# Patient Record
Sex: Female | Born: 1960 | Race: White | Hispanic: No | Marital: Married | Smoking: Never smoker
Health system: Southern US, Community
[De-identification: ages and names within clinical notes are randomized; demographics above are authoritative.]

## PROBLEM LIST (undated history)

## (undated) ENCOUNTER — Emergency Department (HOSPITAL_COMMUNITY): Admission: EM | Payer: BC Managed Care – PPO | Source: Home / Self Care

## (undated) DIAGNOSIS — M2550 Pain in unspecified joint: Secondary | ICD-10-CM

## (undated) DIAGNOSIS — I774 Celiac artery compression syndrome: Secondary | ICD-10-CM

## (undated) DIAGNOSIS — R531 Weakness: Secondary | ICD-10-CM

## (undated) DIAGNOSIS — T8859XA Other complications of anesthesia, initial encounter: Secondary | ICD-10-CM

## (undated) DIAGNOSIS — F329 Major depressive disorder, single episode, unspecified: Secondary | ICD-10-CM

## (undated) DIAGNOSIS — R600 Localized edema: Secondary | ICD-10-CM

## (undated) DIAGNOSIS — M2540 Effusion, unspecified joint: Secondary | ICD-10-CM

## (undated) DIAGNOSIS — R112 Nausea with vomiting, unspecified: Secondary | ICD-10-CM

## (undated) DIAGNOSIS — J189 Pneumonia, unspecified organism: Secondary | ICD-10-CM

## (undated) DIAGNOSIS — K219 Gastro-esophageal reflux disease without esophagitis: Secondary | ICD-10-CM

## (undated) DIAGNOSIS — F32A Depression, unspecified: Secondary | ICD-10-CM

## (undated) DIAGNOSIS — G8929 Other chronic pain: Secondary | ICD-10-CM

## (undated) DIAGNOSIS — I771 Stricture of artery: Secondary | ICD-10-CM

## (undated) DIAGNOSIS — N200 Calculus of kidney: Secondary | ICD-10-CM

## (undated) DIAGNOSIS — M549 Dorsalgia, unspecified: Secondary | ICD-10-CM

## (undated) DIAGNOSIS — Z9889 Other specified postprocedural states: Secondary | ICD-10-CM

## (undated) DIAGNOSIS — M1990 Unspecified osteoarthritis, unspecified site: Secondary | ICD-10-CM

## (undated) DIAGNOSIS — IMO0002 Reserved for concepts with insufficient information to code with codable children: Secondary | ICD-10-CM

## (undated) DIAGNOSIS — Z8709 Personal history of other diseases of the respiratory system: Secondary | ICD-10-CM

## (undated) DIAGNOSIS — R609 Edema, unspecified: Secondary | ICD-10-CM

## (undated) DIAGNOSIS — T4145XA Adverse effect of unspecified anesthetic, initial encounter: Secondary | ICD-10-CM

## (undated) DIAGNOSIS — M069 Rheumatoid arthritis, unspecified: Secondary | ICD-10-CM

## (undated) DIAGNOSIS — Z91018 Allergy to other foods: Secondary | ICD-10-CM

## (undated) DIAGNOSIS — E785 Hyperlipidemia, unspecified: Secondary | ICD-10-CM

## (undated) DIAGNOSIS — Z9289 Personal history of other medical treatment: Secondary | ICD-10-CM

## (undated) HISTORY — PX: TUBAL LIGATION: SHX77

## (undated) HISTORY — DX: Stricture of artery: I77.1

## (undated) HISTORY — DX: Celiac artery compression syndrome: I77.4

---

## 1998-05-14 HISTORY — PX: ABDOMINAL HYSTERECTOMY: SHX81

## 2010-12-21 ENCOUNTER — Other Ambulatory Visit (HOSPITAL_COMMUNITY): Payer: Self-pay | Admitting: *Deleted

## 2010-12-21 ENCOUNTER — Other Ambulatory Visit (HOSPITAL_COMMUNITY): Payer: Self-pay | Admitting: Rheumatology

## 2010-12-21 ENCOUNTER — Ambulatory Visit (HOSPITAL_COMMUNITY): Payer: Self-pay

## 2010-12-21 DIAGNOSIS — R945 Abnormal results of liver function studies: Secondary | ICD-10-CM

## 2010-12-22 ENCOUNTER — Ambulatory Visit (HOSPITAL_COMMUNITY)
Admission: RE | Admit: 2010-12-22 | Discharge: 2010-12-22 | Disposition: A | Discharge status: Home / Self Care | Payer: PRIVATE HEALTH INSURANCE | Source: Ambulatory Visit | Attending: *Deleted | Admitting: *Deleted

## 2010-12-22 DIAGNOSIS — R748 Abnormal levels of other serum enzymes: Secondary | ICD-10-CM | POA: Insufficient documentation

## 2010-12-22 DIAGNOSIS — R945 Abnormal results of liver function studies: Secondary | ICD-10-CM

## 2010-12-22 DIAGNOSIS — N200 Calculus of kidney: Secondary | ICD-10-CM | POA: Insufficient documentation

## 2013-11-23 ENCOUNTER — Other Ambulatory Visit: Payer: Self-pay | Admitting: Neurosurgery

## 2013-12-01 NOTE — Pre-Procedure Instructions (Signed)
Kaitlin Fleming  12/01/2013   Your procedure is scheduled on:  Tues, July 28 @ 9:00 AM  Report to [facility]  at 6:00 AM.  Call this number if you have problems the morning of surgery: [phone]   Remember:   Do not eat food or drink liquids after midnight.   Take these medicines the morning of surgery with A SIP OF WATER:    Do not wear jewelry, make-up or nail polish.  Do not wear lotions, powders, or perfumes. You may wear deodorant.  Do not shave 48 hours prior to surgery.   Do not bring valuables to the hospital.  [facility] is not responsible                  for any belongings or valuables.               Contacts, dentures or bridgework may not be worn into surgery.  Leave suitcase in the car. After surgery it may be brought to your room.  For patients admitted to the hospital, discharge time is determined by your                treatment team.               Patients discharged the day of surgery will not be allowed to drive  home.    Special Instructions:  [place] - Preparing for Surgery  Before surgery, you can play an important role.  Because skin is not sterile, your skin needs to be as free of germs as possible.  You can reduce the number of germs on you skin by washing with CHG (chlorahexidine gluconate) soap before surgery.  CHG is an antiseptic cleaner which kills germs and bonds with the skin to continue killing germs even after washing.  Please DO NOT use if you have an allergy to CHG or antibacterial soaps.  If your skin becomes reddened/irritated stop using the CHG and inform your nurse when you arrive at Short Stay.  Do not shave (including legs and underarms) for at least 48 hours prior to the first CHG shower.  You may shave your face.  Please follow these instructions carefully:   1.  Shower with CHG Soap the night before surgery and the                                morning of Surgery.  2.  If you choose to wash your hair, wash your hair  first as usual with your       normal shampoo.  3.  After you shampoo, rinse your hair and body thoroughly to remove the                      Shampoo.  4.  Use CHG as you would any other liquid soap.  You can apply chg directly       to the skin and wash gently with scrungie or a clean washcloth.  5.  Apply the CHG Soap to your body ONLY FROM THE NECK DOWN.        Do not use on open wounds or open sores.  Avoid contact with your eyes,       ears, mouth and genitals (private parts).  Wash genitals (private parts)       with your normal soap.  6.  Wash thoroughly, paying special  attention to the area where your surgery        will be performed.  7.  Thoroughly rinse your body with warm water from the neck down.  8.  DO NOT shower/wash with your normal soap after using and rinsing off       the CHG Soap.  9.  Pat yourself dry with a clean towel.            10.  Wear clean pajamas.            11.  Place clean sheets on your bed the night of your first shower and do not        sleep with pets.  Day of Surgery  Do not apply any lotions/deoderants the morning of surgery.  Please wear clean clothes to the hospital/surgery center.     Please read over the following fact sheets that you were given: Pain Booklet, Coughing and Deep Breathing, MRSA Information and Surgical Site Infection Prevention

## 2013-12-02 ENCOUNTER — Encounter (HOSPITAL_COMMUNITY)
Admission: RE | Admit: 2013-12-02 | Discharge: 2013-12-02 | Disposition: A | Discharge status: Home / Self Care | Payer: BC Managed Care – PPO | Source: Ambulatory Visit | Attending: Neurosurgery | Admitting: Neurosurgery

## 2013-12-02 ENCOUNTER — Encounter (HOSPITAL_COMMUNITY): Payer: Self-pay

## 2013-12-02 DIAGNOSIS — Z01812 Encounter for preprocedural laboratory examination: Secondary | ICD-10-CM | POA: Insufficient documentation

## 2013-12-02 DIAGNOSIS — Z01810 Encounter for preprocedural cardiovascular examination: Secondary | ICD-10-CM | POA: Insufficient documentation

## 2013-12-02 HISTORY — DX: Calculus of kidney: N20.0

## 2013-12-02 HISTORY — DX: Other complications of anesthesia, initial encounter: T88.59XA

## 2013-12-02 HISTORY — DX: Edema, unspecified: R60.9

## 2013-12-02 HISTORY — DX: Depression, unspecified: F32.A

## 2013-12-02 HISTORY — DX: Localized edema: R60.0

## 2013-12-02 HISTORY — DX: Nausea with vomiting, unspecified: R11.2

## 2013-12-02 HISTORY — DX: Personal history of other diseases of the respiratory system: Z87.09

## 2013-12-02 HISTORY — DX: Hyperlipidemia, unspecified: E78.5

## 2013-12-02 HISTORY — DX: Gastro-esophageal reflux disease without esophagitis: K21.9

## 2013-12-02 HISTORY — DX: Pneumonia, unspecified organism: J18.9

## 2013-12-02 HISTORY — DX: Unspecified osteoarthritis, unspecified site: M19.90

## 2013-12-02 HISTORY — DX: Dorsalgia, unspecified: M54.9

## 2013-12-02 HISTORY — DX: Major depressive disorder, single episode, unspecified: F32.9

## 2013-12-02 HISTORY — DX: Weakness: R53.1

## 2013-12-02 HISTORY — DX: Pain in unspecified joint: M25.50

## 2013-12-02 HISTORY — DX: Effusion, unspecified joint: M25.40

## 2013-12-02 HISTORY — DX: Other chronic pain: G89.29

## 2013-12-02 HISTORY — DX: Adverse effect of unspecified anesthetic, initial encounter: T41.45XA

## 2013-12-02 HISTORY — DX: Other specified postprocedural states: Z98.890

## 2013-12-02 HISTORY — DX: Reserved for concepts with insufficient information to code with codable children: IMO0002

## 2013-12-02 HISTORY — DX: Rheumatoid arthritis, unspecified: M06.9

## 2013-12-02 HISTORY — DX: Personal history of other medical treatment: Z92.89

## 2013-12-02 LAB — CBC
HCT: 39.2 % (ref 36.0–46.0)
Hemoglobin: 12.4 g/dL (ref 12.0–15.0)
MCH: 28 pg (ref 26.0–34.0)
MCHC: 31.6 g/dL (ref 30.0–36.0)
MCV: 88.5 fL (ref 78.0–100.0)
Platelets: 283 10*3/uL (ref 150–400)
RBC: 4.43 MIL/uL (ref 3.87–5.11)
RDW: 13 % (ref 11.5–15.5)
WBC: 9.2 10*3/uL (ref 4.0–10.5)

## 2013-12-02 LAB — SURGICAL PCR SCREEN
MRSA, PCR: NEGATIVE
Staphylococcus aureus: NEGATIVE

## 2013-12-02 LAB — BASIC METABOLIC PANEL
Anion gap: 13 (ref 5–15)
BUN: 8 mg/dL (ref 6–23)
CO2: 30 mEq/L (ref 19–32)
Calcium: 9.4 mg/dL (ref 8.4–10.5)
Chloride: 97 mEq/L (ref 96–112)
Creatinine, Ser: 0.63 mg/dL (ref 0.50–1.10)
GFR calc Af Amer: >90 mL/min (ref >90)
GFR calc non Af Amer: >90 mL/min (ref >90)
Glucose, Bld: 99 mg/dL (ref 70–99)
Potassium: 3.6 mEq/L — ABNORMAL LOW (ref 3.7–5.3)
Sodium: 140 mEq/L (ref 137–147)

## 2013-12-02 NOTE — Progress Notes (Addendum)
Pt doesn't have a cardiologist  Denies ever having an echo/stress test/heart cathh  Denies EKG or CXR in past yr   Medical Md is Dr.Cynthia Charm Barges

## 2013-12-03 NOTE — Progress Notes (Signed)
Anesthesia chart review: Patient is a 53 year old female scheduled for left L4-5, L5-S1 laminectomy on 12/08/13 by Dr. Jeral Fruit.  History includes nonsmoker, postoperative nausea vomiting, RA (on daily prednisone), osteoarthritis, hyperlipidemia, GERD, nephrolithiasis (right), chronic pain, hysterectomy, peripheral edema on HCTZ, remote history of pneumonia, depression, "herniated disc" in her c-spine that isn't causing much problem and is being followed by Dr. Jeral Fruit.  She reported a history of vaginal hysterectomy under general anesthesia in 2000 at [facility]).  When she awoke from anesthesia she was wearing a monitor and spent three days in the hospital on telemetry.  She does know any other details except her PCP saw her in follow-up and said her EKG was fine, so maybe she had a reaction/side effect to the anesthesia.  She was never referred to a cardiologist.  She denies chest pain, SOB, or syncope.  She drinks a lot of caffeine. She gets occasional palpitations when lying in bed, but nothing that she feels is significant or prolonged.  PCP is Dr. Samuel Jester.    EKG on 12/02/13 showed: NSR.  Preoperative labs noted.   [facility] initially said they did not have any anesthesia records on this patient, but she is certain her surgery was done there.  She was previously known as Kaitlin Fleming (Kaitlin Fleming) Kaitlin Fleming, so I re-requested her anesthesia record and discharge summary as available.  If received, her assigned anesthesiologist can review on the day of surgery.  I did notify her that she would be on telemetry at least intra-operatively and while in recovery.  Kaitlin Fleming [facility]/Anesthesiology Phone [phone] 12/03/2013 10:50 AM

## 2013-12-07 MED ORDER — CEFAZOLIN SODIUM-DEXTROSE 2-3 GM-% IV SOLR
2.0000 g | INTRAVENOUS | Status: DC
  Filled 2013-12-07: qty 50

## 2013-12-07 NOTE — H&P (Signed)
Kaitlin Fleming is an 53 y.o. female.   Chief Complaint: lumbar pain HPI: chronic lumbar pain with radiation to the left knee, unable to sleep, pain worsen with ambulation.he denies any pain to the right leg  Past Medical History  Diagnosis Date  . Rheumatoid arthritis     takes Prednisone daily  . Complication of anesthesia     pt states after surgery in 2000 had to wear a heart monitor for 3 days.  Marland Kitchen PONV (postoperative nausea and vomiting)   . Peripheral edema     takes HCTZ daily   . Hyperlipidemia     takes Fenofibrate daily  . Pneumonia 51yrs ago    hx of  . History of bronchitis 44yrs ago  . Weakness     and tingling on left side  . Joint pain   . Joint swelling   . Chronic back pain     stenosis  . GERD (gastroesophageal reflux disease)     takes OTC meds if needed  . Osteoarthritis   . DDD (degenerative disc disease)   . Kidney stone     has one right now on the right side but not giving any problems  . History of blood transfusion     as a child  . Depression   . Chronic pain     takes Lexapro daily    Past Surgical History  Procedure Laterality Date  . Abdominal hysterectomy  2000  . Tubal ligation  24 yrs ago    No family history on file. Social History:  reports that she has never smoked. She does not have any smokeless tobacco history on file. She reports that she does not drink alcohol or use illicit drugs.  Allergies:  Allergies  Allergen Reactions  . Doxycycline     blisters  . Penicillins     Breaks out  . Sulfa Antibiotics     Breaks out    No prescriptions prior to admission    No results found for this or any previous visit (from the past 48 hour(s)). No results found.  Review of Systems  Constitutional: Negative.   HENT: Negative.   Eyes: Negative.   Respiratory: Negative.   Cardiovascular: Negative.   Genitourinary: Negative.   Musculoskeletal: Positive for back pain.  Skin: Negative.   Neurological: Positive for sensory  change and focal weakness.  Endo/Heme/Allergies: Negative.   Psychiatric/Behavioral: Negative.     There were no vitals taken for this visit. Physical Exam hent, nl. Neck, nl. Cv,nl. Lungs, clear. Abdomen, soft. Extremities ,nl. NEURO WEAKNESS OF LEFT QUADRICEPS, absent left ankle reflex. Femoral strecth maneuver is positive in the left.  Mri skhows stenosis at l3-4, 4-5 left worse than right.  Assessment/Plan Decompression at left 45,51. She is aware of risks and benefits  Hero Mccathern M 12/07/2013, 5:14 PM

## 2013-12-07 NOTE — Anesthesia Preprocedure Evaluation (Addendum)
Anesthesia Evaluation  Patient identified by MRN, date of birth, ID band Patient awake    Reviewed: Allergy & Precautions, H&P , NPO status , Patient's Chart, lab work & pertinent test results, reviewed documented beta blocker date and time   History of Anesthesia Complications (+) PONV  Airway Mallampati: II TM Distance: >3 FB Neck ROM: Full    Dental  (+) Partial Upper, Dental Advisory Given, Teeth Intact   Pulmonary pneumonia -, resolved,  breath sounds clear to auscultation        Cardiovascular Rhythm:Regular     Neuro/Psych Depression    GI/Hepatic GERD-  Medicated and Controlled,  Endo/Other    Renal/GU Renal InsufficiencyRenal disease     Musculoskeletal  (+) Arthritis -, Rheumatoid disorders,    Abdominal (+)  Abdomen: soft. Bowel sounds: normal.  Peds  Hematology   Anesthesia Other Findings   Reproductive/Obstetrics                        Anesthesia Physical Anesthesia Plan  ASA: II  Anesthesia Plan: General   Post-op Pain Management:    Induction: Intravenous  Airway Management Planned: Oral ETT  Additional Equipment:   Intra-op Plan:   Post-operative Plan: Extubation in OR  Informed Consent: I have reviewed the patients History and Physical, chart, labs and discussed the procedure including the risks, benefits and alternatives for the proposed anesthesia with the patient or authorized representative who has indicated his/her understanding and acceptance.     Plan Discussed with: CRNA and Surgeon  Anesthesia Plan Comments:         Anesthesia Quick Evaluation

## 2013-12-08 ENCOUNTER — Encounter (HOSPITAL_COMMUNITY): Payer: Self-pay | Admitting: *Deleted

## 2013-12-08 ENCOUNTER — Encounter (HOSPITAL_COMMUNITY): Payer: BC Managed Care – PPO | Admitting: Vascular Surgery

## 2013-12-08 ENCOUNTER — Ambulatory Visit (HOSPITAL_COMMUNITY): Payer: BC Managed Care – PPO

## 2013-12-08 ENCOUNTER — Inpatient Hospital Stay (HOSPITAL_COMMUNITY)
Admission: RE | Admit: 2013-12-08 | Discharge: 2013-12-09 | DRG: 520 | Disposition: A | Discharge status: Home / Self Care | Payer: BC Managed Care – PPO | Source: Ambulatory Visit | Attending: Neurosurgery | Admitting: Neurosurgery

## 2013-12-08 ENCOUNTER — Ambulatory Visit (HOSPITAL_COMMUNITY): Payer: BC Managed Care – PPO | Admitting: Certified Registered"

## 2013-12-08 ENCOUNTER — Encounter (HOSPITAL_COMMUNITY)
Admission: RE | Disposition: A | Discharge status: Home / Self Care | Payer: Self-pay | Source: Ambulatory Visit | Attending: Neurosurgery

## 2013-12-08 DIAGNOSIS — Z79899 Other long term (current) drug therapy: Secondary | ICD-10-CM

## 2013-12-08 DIAGNOSIS — M069 Rheumatoid arthritis, unspecified: Secondary | ICD-10-CM | POA: Diagnosis present

## 2013-12-08 DIAGNOSIS — E785 Hyperlipidemia, unspecified: Secondary | ICD-10-CM | POA: Diagnosis present

## 2013-12-08 DIAGNOSIS — G8929 Other chronic pain: Secondary | ICD-10-CM | POA: Diagnosis present

## 2013-12-08 DIAGNOSIS — M48061 Spinal stenosis, lumbar region without neurogenic claudication: Principal | ICD-10-CM | POA: Diagnosis present

## 2013-12-08 DIAGNOSIS — IMO0002 Reserved for concepts with insufficient information to code with codable children: Secondary | ICD-10-CM

## 2013-12-08 DIAGNOSIS — R609 Edema, unspecified: Secondary | ICD-10-CM | POA: Diagnosis present

## 2013-12-08 HISTORY — PX: LUMBAR LAMINECTOMY/DECOMPRESSION MICRODISCECTOMY: SHX5026

## 2013-12-08 SURGERY — LUMBAR LAMINECTOMY/DECOMPRESSION MICRODISCECTOMY 2 LEVELS
Anesthesia: General | Site: Spine Lumbar | Laterality: Left

## 2013-12-08 MED ORDER — METHYLPREDNISOLONE ACETATE 80 MG/ML IJ SUSP
INTRAMUSCULAR | Status: DC | PRN
  Administered 2013-12-08: 80 mg

## 2013-12-08 MED ORDER — GLYCOPYRROLATE 0.2 MG/ML IJ SOLN
INTRAMUSCULAR | Status: DC | PRN
  Administered 2013-12-08: 0.6 mg via INTRAVENOUS

## 2013-12-08 MED ORDER — PHENOL 1.4 % MT LIQD: 1.0000 | OROMUCOSAL | Status: DC | PRN

## 2013-12-08 MED ORDER — ACETAMINOPHEN 325 MG PO TABS: 650.0000 mg | ORAL_TABLET | ORAL | Status: DC | PRN

## 2013-12-08 MED ORDER — FENTANYL CITRATE 0.05 MG/ML IJ SOLN
INTRAMUSCULAR | Status: DC | PRN
  Administered 2013-12-08: 100 ug via INTRAVENOUS

## 2013-12-08 MED ORDER — MIDAZOLAM HCL 5 MG/5ML IJ SOLN
INTRAMUSCULAR | Status: DC | PRN
  Administered 2013-12-08: 2 mg via INTRAVENOUS

## 2013-12-08 MED ORDER — HYDROMORPHONE HCL PF 1 MG/ML IJ SOLN
0.2500 mg | INTRAMUSCULAR | Status: DC | PRN
  Administered 2013-12-08 (×4): 0.5 mg via INTRAVENOUS

## 2013-12-08 MED ORDER — ZOLPIDEM TARTRATE 5 MG PO TABS: 5.0000 mg | ORAL_TABLET | Freq: Every evening | ORAL | Status: DC | PRN

## 2013-12-08 MED ORDER — ONDANSETRON HCL 4 MG/2ML IJ SOLN: 4.0000 mg | INTRAMUSCULAR | Status: DC | PRN

## 2013-12-08 MED ORDER — ROCURONIUM BROMIDE 100 MG/10ML IV SOLN
INTRAVENOUS | Status: DC | PRN
  Administered 2013-12-08: 50 mg via INTRAVENOUS

## 2013-12-08 MED ORDER — OXYCODONE-ACETAMINOPHEN 5-325 MG PO TABS
1.0000 | ORAL_TABLET | ORAL | Status: DC | PRN
  Administered 2013-12-08 – 2013-12-09 (×5): 2 via ORAL
  Filled 2013-12-08 (×5): qty 2

## 2013-12-08 MED ORDER — PREDNISONE 10 MG PO TABS
10.0000 mg | ORAL_TABLET | Freq: Every day | ORAL | Status: DC
  Administered 2013-12-09: 10 mg via ORAL
  Filled 2013-12-08 (×2): qty 1

## 2013-12-08 MED ORDER — VANCOMYCIN HCL 1000 MG IV SOLR
1000.0000 mg | INTRAVENOUS | Status: DC | PRN
  Administered 2013-12-08: 1000 mg via INTRAVENOUS

## 2013-12-08 MED ORDER — HYDROMORPHONE HCL PF 1 MG/ML IJ SOLN
INTRAMUSCULAR | Status: AC
  Filled 2013-12-08: qty 1

## 2013-12-08 MED ORDER — HEMOSTATIC AGENTS (NO CHARGE) OPTIME
TOPICAL | Status: DC | PRN
  Administered 2013-12-08: 1 via TOPICAL

## 2013-12-08 MED ORDER — FENTANYL CITRATE 0.05 MG/ML IJ SOLN
INTRAMUSCULAR | Status: DC | PRN
  Administered 2013-12-08 (×2): 50 ug via INTRAVENOUS
  Administered 2013-12-08: 150 ug via INTRAVENOUS

## 2013-12-08 MED ORDER — SUCCINYLCHOLINE CHLORIDE 20 MG/ML IJ SOLN
INTRAMUSCULAR | Status: AC
  Filled 2013-12-08: qty 1

## 2013-12-08 MED ORDER — LIDOCAINE HCL (CARDIAC) 20 MG/ML IV SOLN
INTRAVENOUS | Status: DC | PRN
  Administered 2013-12-08: 100 mg via INTRAVENOUS

## 2013-12-08 MED ORDER — LACTATED RINGERS IV SOLN
INTRAVENOUS | Status: DC | PRN
  Administered 2013-12-08: 07:00:00 via INTRAVENOUS

## 2013-12-08 MED ORDER — MENTHOL 3 MG MT LOZG: 1.0000 | LOZENGE | OROMUCOSAL | Status: DC | PRN

## 2013-12-08 MED ORDER — PHENYLEPHRINE 40 MCG/ML (10ML) SYRINGE FOR IV PUSH (FOR BLOOD PRESSURE SUPPORT)
PREFILLED_SYRINGE | INTRAVENOUS | Status: AC
  Filled 2013-12-08: qty 10

## 2013-12-08 MED ORDER — OXYCODONE HCL 5 MG/5ML PO SOLN: 5.0000 mg | Freq: Once | ORAL | Status: AC | PRN

## 2013-12-08 MED ORDER — GLYCOPYRROLATE 0.2 MG/ML IJ SOLN
INTRAMUSCULAR | Status: AC
  Filled 2013-12-08: qty 3

## 2013-12-08 MED ORDER — BUPIVACAINE-EPINEPHRINE (PF) 0.5% -1:200000 IJ SOLN
INTRAMUSCULAR | Status: DC | PRN
  Administered 2013-12-08: 10 mL via PERINEURAL

## 2013-12-08 MED ORDER — LACTATED RINGERS IV SOLN
INTRAVENOUS | Status: DC
  Administered 2013-12-08: 07:00:00 via INTRAVENOUS

## 2013-12-08 MED ORDER — VANCOMYCIN HCL IN DEXTROSE 1-5 GM/200ML-% IV SOLN
INTRAVENOUS | Status: AC
  Filled 2013-12-08: qty 200

## 2013-12-08 MED ORDER — SODIUM CHLORIDE 0.9 % IJ SOLN: 3.0000 mL | INTRAMUSCULAR | Status: DC | PRN

## 2013-12-08 MED ORDER — MEPERIDINE HCL 25 MG/ML IJ SOLN: 6.2500 mg | INTRAMUSCULAR | Status: DC | PRN

## 2013-12-08 MED ORDER — DIAZEPAM 5 MG PO TABS
5.0000 mg | ORAL_TABLET | Freq: Four times a day (QID) | ORAL | Status: DC | PRN
  Administered 2013-12-08 – 2013-12-09 (×2): 5 mg via ORAL
  Filled 2013-12-08 (×3): qty 1

## 2013-12-08 MED ORDER — FENTANYL CITRATE 0.05 MG/ML IJ SOLN
INTRAMUSCULAR | Status: AC
  Filled 2013-12-08: qty 5

## 2013-12-08 MED ORDER — NEOSTIGMINE METHYLSULFATE 10 MG/10ML IV SOLN
INTRAVENOUS | Status: AC
  Filled 2013-12-08: qty 1

## 2013-12-08 MED ORDER — HYDROCORTISONE NA SUCCINATE PF 100 MG IJ SOLR
INTRAMUSCULAR | Status: AC
  Filled 2013-12-08: qty 2

## 2013-12-08 MED ORDER — FENTANYL CITRATE 0.05 MG/ML IJ SOLN
INTRAMUSCULAR | Status: AC
  Filled 2013-12-08: qty 2

## 2013-12-08 MED ORDER — LIDOCAINE HCL (CARDIAC) 20 MG/ML IV SOLN
INTRAVENOUS | Status: AC
  Filled 2013-12-08: qty 5

## 2013-12-08 MED ORDER — MIDAZOLAM HCL 2 MG/2ML IJ SOLN
INTRAMUSCULAR | Status: AC
  Filled 2013-12-08: qty 2

## 2013-12-08 MED ORDER — 0.9 % SODIUM CHLORIDE (POUR BTL) OPTIME
TOPICAL | Status: DC | PRN
  Administered 2013-12-08: 1000 mL

## 2013-12-08 MED ORDER — SODIUM CHLORIDE 0.9 % IV SOLN: INTRAVENOUS | Status: DC

## 2013-12-08 MED ORDER — ESCITALOPRAM OXALATE 10 MG PO TABS
10.0000 mg | ORAL_TABLET | Freq: Every day | ORAL | Status: DC
  Administered 2013-12-08: 10 mg via ORAL
  Filled 2013-12-08 (×2): qty 1

## 2013-12-08 MED ORDER — OXYCODONE HCL 5 MG PO TABS
5.0000 mg | ORAL_TABLET | Freq: Once | ORAL | Status: AC | PRN
  Administered 2013-12-08: 5 mg via ORAL

## 2013-12-08 MED ORDER — THROMBIN 5000 UNITS EX SOLR
CUTANEOUS | Status: DC | PRN
  Administered 2013-12-08 (×2): 5000 [IU] via TOPICAL

## 2013-12-08 MED ORDER — ROCURONIUM BROMIDE 50 MG/5ML IV SOLN
INTRAVENOUS | Status: AC
  Filled 2013-12-08: qty 1

## 2013-12-08 MED ORDER — LEFLUNOMIDE 10 MG PO TABS
10.0000 mg | ORAL_TABLET | Freq: Every day | ORAL | Status: DC
  Administered 2013-12-08: 10 mg via ORAL
  Filled 2013-12-08 (×2): qty 1

## 2013-12-08 MED ORDER — SODIUM CHLORIDE 0.9 % IJ SOLN
3.0000 mL | Freq: Two times a day (BID) | INTRAMUSCULAR | Status: DC
  Administered 2013-12-08: 3 mL via INTRAVENOUS

## 2013-12-08 MED ORDER — SODIUM CHLORIDE 0.9 % IV SOLN: 250.0000 mL | INTRAVENOUS | Status: DC

## 2013-12-08 MED ORDER — HYDROCHLOROTHIAZIDE 25 MG PO TABS
25.0000 mg | ORAL_TABLET | Freq: Every day | ORAL | Status: DC
  Administered 2013-12-08: 25 mg via ORAL
  Filled 2013-12-08 (×2): qty 1

## 2013-12-08 MED ORDER — FENOFIBRATE 54 MG PO TABS
54.0000 mg | ORAL_TABLET | Freq: Every day | ORAL | Status: DC
  Administered 2013-12-08: 54 mg via ORAL
  Filled 2013-12-08 (×2): qty 1

## 2013-12-08 MED ORDER — MORPHINE SULFATE 2 MG/ML IJ SOLN: 1.0000 mg | INTRAMUSCULAR | Status: DC | PRN

## 2013-12-08 MED ORDER — NEOSTIGMINE METHYLSULFATE 10 MG/10ML IV SOLN
INTRAVENOUS | Status: DC | PRN
  Administered 2013-12-08: 4 mg via INTRAVENOUS

## 2013-12-08 MED ORDER — PROPOFOL 10 MG/ML IV BOLUS
INTRAVENOUS | Status: DC | PRN
  Administered 2013-12-08: 120 mg via INTRAVENOUS

## 2013-12-08 MED ORDER — OXYCODONE HCL 5 MG PO TABS
ORAL_TABLET | ORAL | Status: AC
  Filled 2013-12-08: qty 1

## 2013-12-08 MED ORDER — ACETAMINOPHEN 650 MG RE SUPP: 650.0000 mg | RECTAL | Status: DC | PRN

## 2013-12-08 MED ORDER — ONDANSETRON HCL 4 MG/2ML IJ SOLN: 4.0000 mg | Freq: Once | INTRAMUSCULAR | Status: DC | PRN

## 2013-12-08 MED ORDER — PROPOFOL 10 MG/ML IV BOLUS
INTRAVENOUS | Status: AC
  Filled 2013-12-08: qty 20

## 2013-12-08 MED ORDER — HYDROCORTISONE NA SUCCINATE PF 1000 MG IJ SOLR
INTRAMUSCULAR | Status: DC | PRN
  Administered 2013-12-08: 100 mg via INTRAVENOUS

## 2013-12-08 SURGICAL SUPPLY — 59 items
BENZOIN TINCTURE PRP APPL 2/3 (GAUZE/BANDAGES/DRESSINGS) ×2 IMPLANT
BLADE 10 SAFETY STRL DISP (BLADE) IMPLANT
BLADE SURG ROTATE 9660 (MISCELLANEOUS) IMPLANT
BUR ACORN 6.0 (BURR) ×2 IMPLANT
BUR MATCHSTICK NEURO 3.0 LAGG (BURR) ×2 IMPLANT
CANISTER SUCT 3000ML (MISCELLANEOUS) ×2 IMPLANT
CONT SPEC 4OZ CLIKSEAL STRL BL (MISCELLANEOUS) ×2 IMPLANT
DRAPE LAPAROTOMY 100X72X124 (DRAPES) ×2 IMPLANT
DRAPE MICROSCOPE LEICA (MISCELLANEOUS) ×2 IMPLANT
DRAPE POUCH INSTRU U-SHP 10X18 (DRAPES) ×2 IMPLANT
DURAPREP 26ML APPLICATOR (WOUND CARE) ×2 IMPLANT
ELECT BLADE 4.0 EZ CLEAN MEGAD (MISCELLANEOUS) ×2
ELECT REM PT RETURN 9FT ADLT (ELECTROSURGICAL) ×2
ELECTRODE BLDE 4.0 EZ CLN MEGD (MISCELLANEOUS) ×1 IMPLANT
ELECTRODE REM PT RTRN 9FT ADLT (ELECTROSURGICAL) ×1 IMPLANT
GAUZE SPONGE 4X4 16PLY XRAY LF (GAUZE/BANDAGES/DRESSINGS) ×2 IMPLANT
GLOVE BIOGEL M 8.0 STRL (GLOVE) ×2 IMPLANT
GLOVE BIOGEL PI IND STRL 7.5 (GLOVE) ×1 IMPLANT
GLOVE BIOGEL PI IND STRL 8.5 (GLOVE) ×1 IMPLANT
GLOVE BIOGEL PI INDICATOR 7.5 (GLOVE) ×1
GLOVE BIOGEL PI INDICATOR 8.5 (GLOVE) ×1
GLOVE ECLIPSE 8.5 STRL (GLOVE) ×2 IMPLANT
GLOVE EXAM NITRILE LRG STRL (GLOVE) IMPLANT
GLOVE EXAM NITRILE MD LF STRL (GLOVE) IMPLANT
GLOVE EXAM NITRILE XL STR (GLOVE) IMPLANT
GLOVE EXAM NITRILE XS STR PU (GLOVE) IMPLANT
GLOVE SURG SS PI 7.0 STRL IVOR (GLOVE) ×2 IMPLANT
GOWN STRL REUS W/ TWL LRG LVL3 (GOWN DISPOSABLE) ×1 IMPLANT
GOWN STRL REUS W/ TWL XL LVL3 (GOWN DISPOSABLE) ×1 IMPLANT
GOWN STRL REUS W/TWL 2XL LVL3 (GOWN DISPOSABLE) ×2 IMPLANT
GOWN STRL REUS W/TWL LRG LVL3 (GOWN DISPOSABLE) ×1
GOWN STRL REUS W/TWL XL LVL3 (GOWN DISPOSABLE) ×1
KIT BASIN OR (CUSTOM PROCEDURE TRAY) ×2 IMPLANT
KIT ROOM TURNOVER OR (KITS) ×2 IMPLANT
NEEDLE HYPO 18GX1.5 BLUNT FILL (NEEDLE) ×2 IMPLANT
NEEDLE HYPO 21X1.5 SAFETY (NEEDLE) IMPLANT
NEEDLE HYPO 25X1 1.5 SAFETY (NEEDLE) ×2 IMPLANT
NEEDLE SPNL 20GX3.5 QUINCKE YW (NEEDLE) ×2 IMPLANT
NS IRRIG 1000ML POUR BTL (IV SOLUTION) ×2 IMPLANT
PACK LAMINECTOMY NEURO (CUSTOM PROCEDURE TRAY) ×2 IMPLANT
PAD ABD 8X10 STRL (GAUZE/BANDAGES/DRESSINGS) IMPLANT
PAD ARMBOARD 7.5X6 YLW CONV (MISCELLANEOUS) ×10 IMPLANT
PATTIES SURGICAL .5 X1 (DISPOSABLE) IMPLANT
RUBBERBAND STERILE (MISCELLANEOUS) ×4 IMPLANT
SPONGE GAUZE 4X4 12PLY (GAUZE/BANDAGES/DRESSINGS) ×2 IMPLANT
SPONGE LAP 4X18 X RAY DECT (DISPOSABLE) IMPLANT
SPONGE SURGIFOAM ABS GEL SZ50 (HEMOSTASIS) ×2 IMPLANT
STRIP CLOSURE SKIN 1/2X4 (GAUZE/BANDAGES/DRESSINGS) ×2 IMPLANT
SUT VIC AB 0 CT1 18XCR BRD8 (SUTURE) ×1 IMPLANT
SUT VIC AB 0 CT1 8-18 (SUTURE) ×1
SUT VIC AB 2-0 CP2 18 (SUTURE) ×2 IMPLANT
SUT VIC AB 3-0 SH 8-18 (SUTURE) ×2 IMPLANT
SYR 20CC LL (SYRINGE) IMPLANT
SYR 20ML ECCENTRIC (SYRINGE) ×2 IMPLANT
SYR 5ML LL (SYRINGE) ×2 IMPLANT
TAPE CLOTH SURG 4X10 WHT LF (GAUZE/BANDAGES/DRESSINGS) ×2 IMPLANT
TOWEL OR 17X24 6PK STRL BLUE (TOWEL DISPOSABLE) ×2 IMPLANT
TOWEL OR 17X26 10 PK STRL BLUE (TOWEL DISPOSABLE) ×2 IMPLANT
WATER STERILE IRR 1000ML POUR (IV SOLUTION) ×2 IMPLANT

## 2013-12-08 NOTE — Transfer of Care (Signed)
Immediate Anesthesia Transfer of Care Note  Patient: Kaitlin Fleming  Procedure(s) Performed: Procedure(s): LEFT LUMBAR FOUR-FIVEW, LUMBAR FIVE-SACRAL ONE LAMINECTOMY (Left)  Patient Location: PACU  Anesthesia Type:General  Level of Consciousness: awake, alert  and oriented  Airway & Oxygen Therapy: Patient connected to face mask oxygen  Post-op Assessment: Report given to PACU RN  Post vital signs: Reviewed and stable  Complications: No apparent anesthesia complications

## 2013-12-08 NOTE — Anesthesia Postprocedure Evaluation (Signed)
Anesthesia Post Note  Patient: Kaitlin Fleming  Procedure(s) Performed: Procedure(s) (LRB): LEFT LUMBAR FOUR-FIVEW, LUMBAR FIVE-SACRAL ONE LAMINECTOMY (Left)  Anesthesia type: general  Patient location: PACU  Post pain: Pain level controlled  Post assessment: Patient's Cardiovascular Status Stable  Last Vitals:  Filed Vitals:   12/08/13 1242  BP: 134/82  Pulse: 94  Temp: 37 C  Resp: 18    Post vital signs: Reviewed and stable  Level of consciousness: sedated  Complications: No apparent anesthesia complications

## 2013-12-08 NOTE — Evaluation (Signed)
Occupational Therapy Evaluation Patient Details Name: Kaitlin Fleming MRN: [id number] DOB: 12-20-1960 Today's Date: 12/08/2013    History of Present Illness 53 y.o. s/p LEFT LUMBAR FOUR-FIVEW, LUMBAR FIVE-SACRAL ONE LAMINECTOMY   Clinical Impression   Pt moving well during session. Education provided and pt verbalized understanding of information OT covered. No further OT needs.      Follow Up Recommendations  No OT follow up;Supervision - Intermittent   Equipment Recommendations  None recommended by OT    Recommendations for Other Services       Precautions / Restrictions Precautions Precautions: Back Precaution Booklet Issued: Yes (comment) Precaution Comments: Educated on precautions Restrictions Weight Bearing Restrictions: No      Mobility Bed Mobility Overal bed mobility: Needs Assistance Bed Mobility: Rolling;Sidelying to Sit;Sit to Sidelying Rolling: Supervision Sidelying to sit: Supervision     Sit to sidelying: Supervision General bed mobility comments: cues for log roll technique.  Transfers Overall transfer level: Needs assistance Equipment used: None Transfers: Sit to/from Stand Sit to Stand: Supervision              Balance                                            ADL Overall ADL's : Needs assistance/impaired     Grooming: Wash/dry hands;Supervision/safety;Standing               Lower Body Dressing: Supervision/safety;Sit to/from stand   Toilet Transfer: Supervision/safety;Ambulation;Regular Toilet;Grab bars   Toileting- Clothing Manipulation and Hygiene: Supervision/safety;Sit to/from stand;Sitting/lateral lean   Tub/ Shower Transfer: Supervision/safety;Ambulation   Functional mobility during ADLs: Supervision/safety;Modified independent General ADL Comments: Educated on use of cup for teeth care and placement of grooming items to avoid bending/twisting. Educated on techniques for LB dressing/bathing.  Explained AE is available if pt would need it later. Explained/demonstrated use of reacher.  Discussed safety tips for home (safe shoes, sitting for most of LB ADLs).     Vision                     Perception     Praxis      Pertinent Vitals/Pain Pain 3-4/10. Repositioned.       Hand Dominance Right   Extremity/Trunk Assessment Upper Extremity Assessment Upper Extremity Assessment:  (rheumatoid arthritis)   Lower Extremity Assessment Lower Extremity Assessment: Overall WFL for tasks assessed       Communication Communication Communication: No difficulties   Cognition Arousal/Alertness: Awake/alert Behavior During Therapy: WFL for tasks assessed/performed Overall Cognitive Status: Within Functional Limits for tasks assessed                     General Comments       Exercises       Shoulder Instructions      Home Living Family/patient expects to be discharged to:: Private residence Living Arrangements: Spouse/significant other;Children   Type of Home: House Home Access: Stairs to enter [employer] of Steps: 4 Entrance Stairs-Rails: Right;Left;Can reach both Home Layout: One level     Bathroom Shower/Tub: Walk-in shower;Tub/shower unit   Bathroom Toilet: Handicapped height (sink close)     Home Equipment: Shower seat;Adaptive equipment Adaptive Equipment: Reacher        Prior Functioning/Environment Level of Independence: Independent             OT Diagnosis:  OT Problem List:     OT Treatment/Interventions:      OT Goals(Current goals can be found in the care plan section)    OT Frequency:     Barriers to D/C:            Co-evaluation              End of Session Equipment Utilized During Treatment: Gait belt Nurse Communication: Mobility status  Activity Tolerance: Patient tolerated treatment well Patient left: in bed;with call bell/phone within reach   Time: 6195-0932 OT Time Calculation  (min): 26 min Charges:  OT General Charges $OT Visit: 1 Procedure OT Evaluation $Initial OT Evaluation Tier I: 1 Procedure OT Treatments $Self Care/Home Management : 8-22 mins G-CodesEarlie Raveling OTR/L [phone] 12/08/2013, 4:53 PM

## 2013-12-08 NOTE — Op Note (Signed)
[name], Kaitlin Fleming NO.:  [id number]  MEDICAL RECORD NO.:  [id number]  LOCATION:  3C08C                        FACILITY:  [facility]  PHYSICIAN:  Hilda Lias, M.D.   DATE OF BIRTH:  02-04-1961  DATE OF PROCEDURE:  12/08/2013 DATE OF DISCHARGE:                              OPERATIVE REPORT   PREOPERATIVE DIAGNOSIS:  Lumbar stenosis with mostly left-sided radiculopathy compromising the L4, L5 and S1 nerve root.  POSTOPERATIVE DIAGNOSIS:  Lumbar stenosis with mostly left-sided radiculopathy compromising the L4, L5 and S1 nerve root.  PROCEDURE:  Left L4 and L5 hemilaminectomy, foraminotomy, lysis of adhesions, decompression of the thecal sac laterally, as well as the L4, L5 and S1 nerve root.  Microscope.  SURGEON:  Hilda Lias, MD  ASSISTANT:  Stefani Dama, MD  CLINICAL HISTORY:  The patient had been complaining of back pain that mostly radiates to the left leg.  She had failed conservative treatment. Myelogram shows stenosis from L4-5 to L5-S1 bilaterally left worse than right one, which is symptomatic to the left.  Surgery was advised and she knew the benefit and the risk.  DESCRIPTION OF PROCEDURE:  The patient was taken to the OR, and after intubation, she was positioned on prone manner.  The back was cleaned with DuraPrep.  It was difficult to feel any bony structure.  I put a needle which was right below S1.  From then on, a midline incision was made through the skin, subcutaneous tissue through thick adipose tissues down to the fascia.  The fascia was retracted on the left side.  We introduced the retractor.  With the help of the microscope, we started drilling the lamina of L5 and the upper part of 4.  The patient had quite a bit of calcified ligament.  We started with the L5 nerve root which was quite swollen and decompression was done all the way down to the foramen.  The worse one was L5 which was released.  Decompression using the drill  and the 1, 2, and 3 mm Kerrison punch was achieved the same to the L4.  At the end, we had good lateral decompression of the thecal sac.  The area was irrigated.  Valsalva maneuver was negative. Depo-Medrol and fentanyl were left in the AP dural space and the wound was closed with Vicryl and Steri-Strips.          ______________________________ Hilda Lias, M.D.     EB/MEDQ  D:  12/08/2013  T:  12/08/2013  Job:  [id number]

## 2013-12-08 NOTE — Progress Notes (Signed)
Utilization review completed.  

## 2013-12-08 NOTE — Anesthesia Procedure Notes (Signed)
Procedure Name: Intubation Date/Time: 12/08/2013 9:08 AM Performed by: Ellin Goodie Pre-anesthesia Checklist: Patient identified, Emergency Drugs available, Suction available, Patient being monitored and Timeout performed Patient Re-evaluated:Patient Re-evaluated prior to inductionOxygen Delivery Method: Circle system utilized Preoxygenation: Pre-oxygenation with 100% oxygen Intubation Type: IV induction Ventilation: Mask ventilation without difficulty Laryngoscope Size: Mac and 3 Grade View: Grade I Tube type: Oral Tube size: 7.5 mm Number of attempts: 1 Airway Equipment and Method: Stylet Placement Confirmation: ETT inserted through vocal cords under direct vision,  positive ETCO2 and breath sounds checked- equal and bilateral Secured at: 22 cm Tube secured with: Tape Dental Injury: Teeth and Oropharynx as per pre-operative assessment  Comments: Easy atraumatic induction and intubation with MAC 3 blade.  Dr. Michelle Piper verified placement of ETT.  Carlynn Herald, CRNA

## 2013-12-09 ENCOUNTER — Encounter (HOSPITAL_COMMUNITY): Payer: Self-pay | Admitting: Neurosurgery

## 2013-12-09 NOTE — Progress Notes (Signed)
Pt doing well. Pt and daughter given D/C instructions with Rx's, verbal understanding was provided. Pt's IV was removed prior to D/C. Pt D/C'd home via walking @ 1220 per MD order. Pt is stable @ D/C and has no other needs. Rema Fendt, RN

## 2013-12-09 NOTE — Discharge Summary (Signed)
Physician Discharge Summary  Patient ID: Kaitlin Fleming MRN: [id number] DOB/AGE: February 06, 1961 53 y.o.  Admit date: 12/08/2013 Discharge date: 12/09/2013  Admission Diagnoses:lumbar stenosis  Discharge Diagnoses:  Active Problems:   Lumbar stenosis without neurogenic claudication   Discharged Condition: no pain  Hospital Course: surgery  Consults: none  Significant Diagnostic Studies: mri  Treatments: lumbar decompression  Discharge Exam: Blood pressure 112/70, pulse 67, temperature 98.7 F (37.1 C), temperature source Oral, resp. rate 18, weight 190 lb (86.183 kg), SpO2 96.00%. No weakness  Disposition: home     Medication List    ASK your doctor about these medications       acetaminophen 500 MG tablet  Commonly known as:  TYLENOL  Take 1,000 mg by mouth every 6 (six) hours as needed.     escitalopram 10 MG tablet  Commonly known as:  LEXAPRO  Take 10 mg by mouth daily.     fenofibrate micronized 134 MG capsule  Commonly known as:  LOFIBRA  Take 134 mg by mouth daily before breakfast.     hydrochlorothiazide 25 MG tablet  Commonly known as:  HYDRODIURIL  Take 25 mg by mouth daily.     HYDROcodone-acetaminophen 10-325 MG per tablet  Commonly known as:  NORCO  Take 1 tablet by mouth every 4 (four) hours as needed.     leflunomide 10 MG tablet  Commonly known as:  ARAVA  Take 10 mg by mouth daily.     predniSONE 10 MG tablet  Commonly known as:  DELTASONE  Take 10 mg by mouth daily with breakfast.         Signed: Zachrey Deutscher M 12/09/2013, 11:55 AM

## 2014-02-12 ENCOUNTER — Other Ambulatory Visit: Payer: Self-pay | Admitting: Neurosurgery

## 2014-02-25 ENCOUNTER — Encounter (HOSPITAL_COMMUNITY): Payer: Self-pay | Admitting: Pharmacy Technician

## 2014-03-03 ENCOUNTER — Encounter (HOSPITAL_COMMUNITY)
Admission: RE | Admit: 2014-03-03 | Discharge: 2014-03-03 | Disposition: A | Discharge status: Home / Self Care | Payer: BC Managed Care – PPO | Source: Ambulatory Visit | Attending: Neurosurgery | Admitting: Neurosurgery

## 2014-03-03 DIAGNOSIS — Z01812 Encounter for preprocedural laboratory examination: Secondary | ICD-10-CM | POA: Diagnosis present

## 2014-03-03 LAB — BASIC METABOLIC PANEL
Anion gap: 14 (ref 5–15)
BUN: 6 mg/dL (ref 6–23)
CO2: 29 mEq/L (ref 19–32)
Calcium: 9.4 mg/dL (ref 8.4–10.5)
Chloride: 97 mEq/L (ref 96–112)
Creatinine, Ser: 0.61 mg/dL (ref 0.50–1.10)
GFR calc Af Amer: >90 mL/min (ref >90)
GFR calc non Af Amer: >90 mL/min (ref >90)
Glucose, Bld: 98 mg/dL (ref 70–99)
Potassium: 3.6 mEq/L — ABNORMAL LOW (ref 3.7–5.3)
Sodium: 140 mEq/L (ref 137–147)

## 2014-03-03 LAB — CBC
HCT: 38.1 % (ref 36.0–46.0)
Hemoglobin: 12.2 g/dL (ref 12.0–15.0)
MCH: 26.5 pg (ref 26.0–34.0)
MCHC: 32 g/dL (ref 30.0–36.0)
MCV: 82.6 fL (ref 78.0–100.0)
Platelets: 329 10*3/uL (ref 150–400)
RBC: 4.61 MIL/uL (ref 3.87–5.11)
RDW: 14.5 % (ref 11.5–15.5)
WBC: 11.3 10*3/uL — ABNORMAL HIGH (ref 4.0–10.5)

## 2014-03-03 LAB — SURGICAL PCR SCREEN
MRSA, PCR: NEGATIVE
Staphylococcus aureus: NEGATIVE

## 2014-03-03 LAB — TYPE AND SCREEN
ABO/RH(D): O POS
Antibody Screen: NEGATIVE

## 2014-03-03 LAB — ABO/RH: ABO/RH(D): O POS

## 2014-03-03 NOTE — Pre-Procedure Instructions (Signed)
Mayari Matus  03/03/2014   Your procedure is scheduled on:  March 10, 2014  Report to [facility] Admitting at 8 AM.  Call this number if you have problems the morning of surgery: [phone]   Remember:   Do not eat food or drink liquids after midnight.   Take these medicines the morning of surgery with A SIP OF WATER: pain pill if needed, escitalopram (LEXAPRO), fenofibrate micronized (LOFIBRA), predniSONE  (DELTASONE)     Do not wear jewelry, make-up or nail polish.  Do not wear lotions, powders, or perfumes. You may wear deodorant.  Do not shave 48 hours prior to surgery.   Do not bring valuables to the hospital.  [facility] is not responsible for any belongings or valuables.               Contacts, dentures or bridgework may not be worn into surgery.  Leave suitcase in the car. After surgery it may be brought to your room.  For patients admitted to the hospital, discharge time is determined by your  treatment team.               Patients discharged the day of surgery will not be allowed to drive home.  Name and phone number of your driver: family/friend     Please read over the following fact sheets that you were given: Pain Booklet, Coughing and Deep Breathing, Blood Transfusion Information and Surgical Site Infection Prevention

## 2014-03-09 MED ORDER — VANCOMYCIN HCL IN DEXTROSE 1-5 GM/200ML-% IV SOLN
1000.0000 mg | INTRAVENOUS | Status: AC
  Administered 2014-03-10: 1000 mg via INTRAVENOUS
  Filled 2014-03-09: qty 200

## 2014-03-09 NOTE — H&P (Signed)
Kaitlin Fleming is an 53 y.o. female.   Chief Complaint: lumbar pain with radiation to the right leg HPI: patient who several months ago underwent LEFT laminotomies and foraminotomies because of left leg pain. Did well but now she came back with lumbar pain with radiation to the left leg no better with conservative treatment.  Past Medical History  Diagnosis Date  . Rheumatoid arthritis     takes Prednisone daily  . Complication of anesthesia     pt states after surgery in 2000 had to wear a heart monitor for 3 days.  Marland Kitchen PONV (postoperative nausea and vomiting)   . Peripheral edema     takes HCTZ daily   . Hyperlipidemia     takes Fenofibrate daily  . Pneumonia 74yrs ago    hx of  . History of bronchitis 34yrs ago  . Weakness     and tingling on left side  . Joint pain   . Joint swelling   . Chronic back pain     stenosis  . GERD (gastroesophageal reflux disease)     takes OTC meds if needed  . Osteoarthritis   . DDD (degenerative disc disease)   . Kidney stone     has one right now on the right side but not giving any problems  . History of blood transfusion     as a child  . Depression   . Chronic pain     takes Lexapro daily    Past Surgical History  Procedure Laterality Date  . Abdominal hysterectomy  2000  . Tubal ligation  24 yrs ago  . Lumbar laminectomy/decompression microdiscectomy Left 12/08/2013    Procedure: LEFT LUMBAR FOUR-FIVEW, LUMBAR FIVE-SACRAL ONE LAMINECTOMY;  Surgeon: Karn Cassis, MD;  Location: [facility];  Service: Neurosurgery;  Laterality: Left;    No family history on file. Social History:  reports that she has never smoked. She does not have any smokeless tobacco history on file. She reports that she does not drink alcohol or use illicit drugs.  Allergies:  Allergies  Allergen Reactions  . Doxycycline     blisters  . Penicillins     Breaks out  . Sulfa Antibiotics     Breaks out    No prescriptions prior to admission    No  results found for this or any previous visit (from the past 48 hour(s)). No results found.  Review of Systems  Constitutional: Negative.   HENT: Negative.   Eyes: Negative.   Respiratory: Negative.   Gastrointestinal: Negative.   Genitourinary: Negative.   Musculoskeletal: Positive for back pain and joint pain.       History of RA  Skin: Negative.   Neurological: Positive for sensory change and focal weakness.  Endo/Heme/Allergies: Negative.   Psychiatric/Behavioral: Negative.     There were no vitals taken for this visit. Physical Exam  Hent, nl. Neck, nl. Cv, nl. Lungs, clear. Abdomen, soft. Extremituies, nl. NEURO slr POSITIVE AT 60 DEGREES. WEAKNESS OF df BOTH FEET.  Sensory wnl. Restriction to mobilize her lumbar spine. MRI lumbar shows severe DDD at l4-5, l5-s1 Assessment/Plan Patient to proceed with decompression and fusion at l45, l5s1 with cages and pedicles screws. She is aware of risks and benefits  Kingslee Mairena M 03/09/2014, 6:19 PM

## 2014-03-10 ENCOUNTER — Encounter (HOSPITAL_COMMUNITY)
Admission: RE | Disposition: A | Discharge status: Home / Self Care | Payer: PRIVATE HEALTH INSURANCE | Source: Ambulatory Visit | Attending: Neurosurgery

## 2014-03-10 ENCOUNTER — Inpatient Hospital Stay (HOSPITAL_COMMUNITY): Payer: BC Managed Care – PPO | Admitting: Anesthesiology

## 2014-03-10 ENCOUNTER — Inpatient Hospital Stay (HOSPITAL_COMMUNITY): Payer: BC Managed Care – PPO

## 2014-03-10 ENCOUNTER — Inpatient Hospital Stay (HOSPITAL_COMMUNITY)
Admission: RE | Admit: 2014-03-10 | Discharge: 2014-03-12 | DRG: 460 | Disposition: A | Discharge status: Home / Self Care | Payer: BC Managed Care – PPO | Source: Ambulatory Visit | Attending: Neurosurgery | Admitting: Neurosurgery

## 2014-03-10 ENCOUNTER — Encounter (HOSPITAL_COMMUNITY): Payer: Self-pay | Admitting: *Deleted

## 2014-03-10 ENCOUNTER — Encounter (HOSPITAL_COMMUNITY): Payer: BC Managed Care – PPO | Admitting: Anesthesiology

## 2014-03-10 DIAGNOSIS — E785 Hyperlipidemia, unspecified: Secondary | ICD-10-CM | POA: Diagnosis present

## 2014-03-10 DIAGNOSIS — I10 Essential (primary) hypertension: Secondary | ICD-10-CM | POA: Diagnosis present

## 2014-03-10 DIAGNOSIS — Z7952 Long term (current) use of systemic steroids: Secondary | ICD-10-CM | POA: Diagnosis not present

## 2014-03-10 DIAGNOSIS — M5116 Intervertebral disc disorders with radiculopathy, lumbar region: Secondary | ICD-10-CM | POA: Diagnosis present

## 2014-03-10 DIAGNOSIS — M545 Low back pain: Secondary | ICD-10-CM | POA: Diagnosis present

## 2014-03-10 DIAGNOSIS — Z419 Encounter for procedure for purposes other than remedying health state, unspecified: Secondary | ICD-10-CM

## 2014-03-10 DIAGNOSIS — R600 Localized edema: Secondary | ICD-10-CM | POA: Diagnosis present

## 2014-03-10 DIAGNOSIS — Z6831 Body mass index (BMI) 31.0-31.9, adult: Secondary | ICD-10-CM

## 2014-03-10 DIAGNOSIS — M51369 Other intervertebral disc degeneration, lumbar region without mention of lumbar back pain or lower extremity pain: Secondary | ICD-10-CM | POA: Diagnosis present

## 2014-03-10 DIAGNOSIS — M069 Rheumatoid arthritis, unspecified: Secondary | ICD-10-CM | POA: Diagnosis present

## 2014-03-10 DIAGNOSIS — F329 Major depressive disorder, single episode, unspecified: Secondary | ICD-10-CM | POA: Diagnosis present

## 2014-03-10 DIAGNOSIS — E669 Obesity, unspecified: Secondary | ICD-10-CM | POA: Diagnosis present

## 2014-03-10 DIAGNOSIS — M5136 Other intervertebral disc degeneration, lumbar region: Secondary | ICD-10-CM | POA: Diagnosis present

## 2014-03-10 HISTORY — PX: BACK SURGERY: SHX140

## 2014-03-10 SURGERY — POSTERIOR LUMBAR FUSION 2 LEVEL
Anesthesia: General

## 2014-03-10 MED ORDER — ONDANSETRON HCL 4 MG/2ML IJ SOLN: 4.0000 mg | Freq: Four times a day (QID) | INTRAMUSCULAR | Status: DC | PRN

## 2014-03-10 MED ORDER — ACETAMINOPHEN 325 MG PO TABS
650.0000 mg | ORAL_TABLET | ORAL | Status: DC | PRN
Start: 2014-03-10 — End: 2014-03-12

## 2014-03-10 MED ORDER — SODIUM CHLORIDE 0.9 % IJ SOLN
3.0000 mL | Freq: Two times a day (BID) | INTRAMUSCULAR | Status: DC
  Administered 2014-03-11 – 2014-03-12 (×2): 3 mL via INTRAVENOUS

## 2014-03-10 MED ORDER — HYDROCORTISONE SOD SUCCINATE 100 MG PF FOR IT USE
100.0000 mg | Freq: Once | INTRAMUSCULAR | Status: AC
  Administered 2014-03-10: 100 mg via INTRATHECAL
  Filled 2014-03-10: qty 1

## 2014-03-10 MED ORDER — ONDANSETRON HCL 4 MG/2ML IJ SOLN: 4.0000 mg | INTRAMUSCULAR | Status: DC | PRN

## 2014-03-10 MED ORDER — HYDROMORPHONE HCL 1 MG/ML IJ SOLN
INTRAMUSCULAR | Status: AC
  Filled 2014-03-10: qty 1

## 2014-03-10 MED ORDER — SODIUM CHLORIDE 0.9 % IV SOLN: 250.0000 mL | INTRAVENOUS | Status: DC

## 2014-03-10 MED ORDER — BUPIVACAINE LIPOSOME 1.3 % IJ SUSP
INTRAMUSCULAR | Status: DC | PRN
  Administered 2014-03-10: 20 mL

## 2014-03-10 MED ORDER — SODIUM CHLORIDE 0.9 % IV SOLN
INTRAVENOUS | Status: DC
  Administered 2014-03-10: 18:00:00 via INTRAVENOUS
  Administered 2014-03-11: 1000 mL via INTRAVENOUS

## 2014-03-10 MED ORDER — CLINDAMYCIN HCL 300 MG PO CAPS
300.0000 mg | ORAL_CAPSULE | Freq: Three times a day (TID) | ORAL | Status: DC
  Administered 2014-03-10 – 2014-03-12 (×5): 300 mg via ORAL
  Filled 2014-03-10 (×7): qty 1

## 2014-03-10 MED ORDER — ZOLPIDEM TARTRATE 5 MG PO TABS: 5.0000 mg | ORAL_TABLET | Freq: Every evening | ORAL | Status: DC | PRN

## 2014-03-10 MED ORDER — ACETAMINOPHEN 650 MG RE SUPP: 650.0000 mg | RECTAL | Status: DC | PRN

## 2014-03-10 MED ORDER — ALBUMIN HUMAN 5 % IV SOLN
INTRAVENOUS | Status: DC | PRN
  Administered 2014-03-10: 12:00:00 via INTRAVENOUS

## 2014-03-10 MED ORDER — SODIUM CHLORIDE 0.9 % IJ SOLN: 9.0000 mL | INTRAMUSCULAR | Status: DC | PRN

## 2014-03-10 MED ORDER — DIPHENHYDRAMINE HCL 50 MG/ML IJ SOLN
10.0000 mg | Freq: Once | INTRAMUSCULAR | Status: AC
  Administered 2014-03-10: 10 mg via INTRAVENOUS

## 2014-03-10 MED ORDER — PROPOFOL 10 MG/ML IV BOLUS
INTRAVENOUS | Status: AC
  Filled 2014-03-10: qty 20

## 2014-03-10 MED ORDER — NALOXONE HCL 0.4 MG/ML IJ SOLN: 0.4000 mg | INTRAMUSCULAR | Status: DC | PRN

## 2014-03-10 MED ORDER — DEXAMETHASONE SODIUM PHOSPHATE 4 MG/ML IJ SOLN
INTRAMUSCULAR | Status: DC | PRN
  Administered 2014-03-10: 4 mg via INTRAVENOUS

## 2014-03-10 MED ORDER — LIDOCAINE HCL (CARDIAC) 20 MG/ML IV SOLN
INTRAVENOUS | Status: DC | PRN
  Administered 2014-03-10: 50 mg via INTRAVENOUS

## 2014-03-10 MED ORDER — ONDANSETRON HCL 4 MG/2ML IJ SOLN
INTRAMUSCULAR | Status: AC
  Filled 2014-03-10: qty 2

## 2014-03-10 MED ORDER — SCOPOLAMINE 1 MG/3DAYS TD PT72
1.0000 | MEDICATED_PATCH | Freq: Once | TRANSDERMAL | Status: DC
  Filled 2014-03-10: qty 1

## 2014-03-10 MED ORDER — DIPHENHYDRAMINE HCL 12.5 MG/5ML PO ELIX: 12.5000 mg | ORAL_SOLUTION | Freq: Four times a day (QID) | ORAL | Status: DC | PRN

## 2014-03-10 MED ORDER — SCOPOLAMINE 1 MG/3DAYS TD PT72
MEDICATED_PATCH | TRANSDERMAL | Status: DC | PRN
  Administered 2014-03-10: 1 via TRANSDERMAL

## 2014-03-10 MED ORDER — ROCURONIUM BROMIDE 50 MG/5ML IV SOLN
INTRAVENOUS | Status: AC
  Filled 2014-03-10: qty 1

## 2014-03-10 MED ORDER — PHENOL 1.4 % MT LIQD
1.0000 | OROMUCOSAL | Status: DC | PRN
  Filled 2014-03-10: qty 177

## 2014-03-10 MED ORDER — VANCOMYCIN HCL 1000 MG IV SOLR
INTRAVENOUS | Status: AC
  Filled 2014-03-10: qty 1000

## 2014-03-10 MED ORDER — ROCURONIUM BROMIDE 50 MG/5ML IV SOLN
INTRAVENOUS | Status: AC
Start: 2014-03-10 — End: 2014-03-10
  Filled 2014-03-10: qty 2

## 2014-03-10 MED ORDER — PROPOFOL 10 MG/ML IV BOLUS
INTRAVENOUS | Status: DC | PRN
  Administered 2014-03-10: 100 mg via INTRAVENOUS

## 2014-03-10 MED ORDER — PROMETHAZINE HCL 25 MG/ML IJ SOLN: 6.2500 mg | INTRAMUSCULAR | Status: DC | PRN

## 2014-03-10 MED ORDER — GLYCOPYRROLATE 0.2 MG/ML IJ SOLN
INTRAMUSCULAR | Status: DC | PRN
  Administered 2014-03-10: .6 mg via INTRAVENOUS

## 2014-03-10 MED ORDER — CEFAZOLIN SODIUM 1-5 GM-% IV SOLN: 1.0000 g | Freq: Three times a day (TID) | INTRAVENOUS | Status: DC

## 2014-03-10 MED ORDER — MEPERIDINE HCL 25 MG/ML IJ SOLN: 6.2500 mg | INTRAMUSCULAR | Status: DC | PRN

## 2014-03-10 MED ORDER — THROMBIN 20000 UNITS EX SOLR
CUTANEOUS | Status: DC | PRN
  Administered 2014-03-10: 11:00:00 via TOPICAL

## 2014-03-10 MED ORDER — SODIUM CHLORIDE 0.9 % IJ SOLN
INTRAMUSCULAR | Status: AC
  Filled 2014-03-10: qty 10

## 2014-03-10 MED ORDER — LIDOCAINE HCL (CARDIAC) 20 MG/ML IV SOLN
INTRAVENOUS | Status: AC
  Filled 2014-03-10: qty 5

## 2014-03-10 MED ORDER — NEOSTIGMINE METHYLSULFATE 10 MG/10ML IV SOLN
INTRAVENOUS | Status: DC | PRN
Start: 2014-03-10 — End: 2014-03-10
  Administered 2014-03-10: 4 mg via INTRAVENOUS

## 2014-03-10 MED ORDER — DIAZEPAM 5 MG PO TABS
5.0000 mg | ORAL_TABLET | Freq: Four times a day (QID) | ORAL | Status: DC | PRN
  Administered 2014-03-11 – 2014-03-12 (×5): 5 mg via ORAL
  Filled 2014-03-10 (×6): qty 1

## 2014-03-10 MED ORDER — HYDROMORPHONE HCL 1 MG/ML IJ SOLN
0.2500 mg | INTRAMUSCULAR | Status: DC | PRN
  Administered 2014-03-10 (×4): 0.5 mg via INTRAVENOUS

## 2014-03-10 MED ORDER — OXYCODONE HCL 5 MG/5ML PO SOLN: 5.0000 mg | Freq: Once | ORAL | Status: DC | PRN

## 2014-03-10 MED ORDER — BUPIVACAINE LIPOSOME 1.3 % IJ SUSP
20.0000 mL | INTRAMUSCULAR | Status: AC
  Filled 2014-03-10: qty 20

## 2014-03-10 MED ORDER — LEFLUNOMIDE 10 MG PO TABS
10.0000 mg | ORAL_TABLET | Freq: Every day | ORAL | Status: DC
  Administered 2014-03-11 – 2014-03-12 (×2): 10 mg via ORAL
  Filled 2014-03-10 (×2): qty 1

## 2014-03-10 MED ORDER — LACTATED RINGERS IV SOLN
INTRAVENOUS | Status: DC
  Administered 2014-03-10 (×5): via INTRAVENOUS

## 2014-03-10 MED ORDER — FENTANYL 10 MCG/ML IV SOLN
INTRAVENOUS | Status: DC
  Administered 2014-03-10: 16:00:00 via INTRAVENOUS
  Administered 2014-03-10: 180 ug via INTRAVENOUS
  Administered 2014-03-11: 09:00:00 via INTRAVENOUS
  Administered 2014-03-11 (×2): 105 ug via INTRAVENOUS
  Filled 2014-03-10 (×3): qty 50

## 2014-03-10 MED ORDER — PHENYLEPHRINE HCL 10 MG/ML IJ SOLN
INTRAMUSCULAR | Status: DC | PRN
  Administered 2014-03-10 (×2): 80 ug via INTRAVENOUS

## 2014-03-10 MED ORDER — 0.9 % SODIUM CHLORIDE (POUR BTL) OPTIME
TOPICAL | Status: DC | PRN
  Administered 2014-03-10: 1000 mL

## 2014-03-10 MED ORDER — DIPHENHYDRAMINE HCL 50 MG/ML IJ SOLN: 12.5000 mg | Freq: Four times a day (QID) | INTRAMUSCULAR | Status: DC | PRN

## 2014-03-10 MED ORDER — ESCITALOPRAM OXALATE 10 MG PO TABS
10.0000 mg | ORAL_TABLET | Freq: Every day | ORAL | Status: DC
  Administered 2014-03-10 – 2014-03-12 (×3): 10 mg via ORAL
  Filled 2014-03-10 (×3): qty 1

## 2014-03-10 MED ORDER — VANCOMYCIN HCL 1000 MG IV SOLR
INTRAVENOUS | Status: DC | PRN
  Administered 2014-03-10: 1000 mg via TOPICAL

## 2014-03-10 MED ORDER — HYDROCHLOROTHIAZIDE 25 MG PO TABS
25.0000 mg | ORAL_TABLET | Freq: Every day | ORAL | Status: DC
  Administered 2014-03-11 – 2014-03-12 (×2): 25 mg via ORAL
  Filled 2014-03-10 (×2): qty 1

## 2014-03-10 MED ORDER — OXYCODONE HCL 5 MG PO TABS: 5.0000 mg | ORAL_TABLET | Freq: Once | ORAL | Status: DC | PRN

## 2014-03-10 MED ORDER — FENOFIBRATE 160 MG PO TABS
160.0000 mg | ORAL_TABLET | Freq: Every day | ORAL | Status: DC
  Administered 2014-03-10 – 2014-03-12 (×3): 160 mg via ORAL
  Filled 2014-03-10 (×3): qty 1

## 2014-03-10 MED ORDER — ONDANSETRON HCL 4 MG/2ML IJ SOLN
INTRAMUSCULAR | Status: DC | PRN
  Administered 2014-03-10: 4 mg via INTRAVENOUS

## 2014-03-10 MED ORDER — OXYCODONE-ACETAMINOPHEN 5-325 MG PO TABS
1.0000 | ORAL_TABLET | ORAL | Status: DC | PRN
  Administered 2014-03-10 – 2014-03-12 (×10): 2 via ORAL
  Filled 2014-03-10 (×10): qty 2

## 2014-03-10 MED ORDER — HYDROMORPHONE HCL 1 MG/ML IJ SOLN
INTRAMUSCULAR | Status: AC
Start: 2014-03-10 — End: 2014-03-11
  Filled 2014-03-10: qty 1

## 2014-03-10 MED ORDER — MENTHOL 3 MG MT LOZG
1.0000 | LOZENGE | OROMUCOSAL | Status: DC | PRN
  Filled 2014-03-10: qty 9

## 2014-03-10 MED ORDER — MIDAZOLAM HCL 2 MG/2ML IJ SOLN
0.5000 mg | Freq: Once | INTRAMUSCULAR | Status: AC | PRN
  Administered 2014-03-10: 2 mg via INTRAVENOUS

## 2014-03-10 MED ORDER — FENTANYL CITRATE 0.05 MG/ML IJ SOLN
INTRAMUSCULAR | Status: AC
  Filled 2014-03-10: qty 5

## 2014-03-10 MED ORDER — MIDAZOLAM HCL 2 MG/2ML IJ SOLN
INTRAMUSCULAR | Status: AC
  Filled 2014-03-10: qty 2

## 2014-03-10 MED ORDER — GLYCOPYRROLATE 0.2 MG/ML IJ SOLN
INTRAMUSCULAR | Status: AC
Start: 2014-03-10 — End: 2014-03-10
  Filled 2014-03-10: qty 1

## 2014-03-10 MED ORDER — FENTANYL CITRATE 0.05 MG/ML IJ SOLN
INTRAMUSCULAR | Status: DC | PRN
  Administered 2014-03-10 (×2): 50 ug via INTRAVENOUS
  Administered 2014-03-10: 200 ug via INTRAVENOUS

## 2014-03-10 MED ORDER — SODIUM CHLORIDE 0.9 % IJ SOLN: 3.0000 mL | INTRAMUSCULAR | Status: DC | PRN

## 2014-03-10 MED ORDER — ROCURONIUM BROMIDE 100 MG/10ML IV SOLN
INTRAVENOUS | Status: DC | PRN
  Administered 2014-03-10 (×3): 10 mg via INTRAVENOUS
  Administered 2014-03-10: 20 mg via INTRAVENOUS
  Administered 2014-03-10: 10 mg via INTRAVENOUS
  Administered 2014-03-10: 40 mg via INTRAVENOUS
  Administered 2014-03-10 (×2): 10 mg via INTRAVENOUS

## 2014-03-10 SURGICAL SUPPLY — 67 items
BENZOIN TINCTURE PRP APPL 2/3 (GAUZE/BANDAGES/DRESSINGS) ×2 IMPLANT
BLADE CLIPPER SURG (BLADE) IMPLANT
BUR ACORN 6.0 (BURR) ×2 IMPLANT
BUR MATCHSTICK NEURO 3.0 LAGG (BURR) ×2 IMPLANT
CANISTER SUCT 3000ML (MISCELLANEOUS) ×2 IMPLANT
CAP LOCKING THREADED (Cap) ×12 IMPLANT
CONT SPEC 4OZ CLIKSEAL STRL BL (MISCELLANEOUS) ×2 IMPLANT
COVER BACK TABLE 60X90IN (DRAPES) ×2 IMPLANT
CROSSLINK SPINAL FUSION (Cage) ×2 IMPLANT
DRAPE C-ARM 42X72 X-RAY (DRAPES) ×4 IMPLANT
DRAPE LAPAROTOMY 100X72X124 (DRAPES) ×2 IMPLANT
DRAPE POUCH INSTRU U-SHP 10X18 (DRAPES) ×2 IMPLANT
DRSG AQUACEL AG ADV 3.5X14 (GAUZE/BANDAGES/DRESSINGS) ×2 IMPLANT
DRSG PAD ABDOMINAL 8X10 ST (GAUZE/BANDAGES/DRESSINGS) IMPLANT
DURAPREP 26ML APPLICATOR (WOUND CARE) ×2 IMPLANT
ELECT BLADE 4.0 EZ CLEAN MEGAD (MISCELLANEOUS) ×2
ELECT REM PT RETURN 9FT ADLT (ELECTROSURGICAL) ×2
ELECTRODE BLDE 4.0 EZ CLN MEGD (MISCELLANEOUS) ×1 IMPLANT
ELECTRODE REM PT RTRN 9FT ADLT (ELECTROSURGICAL) ×1 IMPLANT
EVACUATOR 1/8 PVC DRAIN (DRAIN) ×2 IMPLANT
GAUZE SPONGE 4X4 12PLY STRL (GAUZE/BANDAGES/DRESSINGS) ×2 IMPLANT
GAUZE SPONGE 4X4 16PLY XRAY LF (GAUZE/BANDAGES/DRESSINGS) ×2 IMPLANT
GLOVE BIOGEL M 8.0 STRL (GLOVE) ×2 IMPLANT
GLOVE BIOGEL PI IND STRL 7.0 (GLOVE) ×1 IMPLANT
GLOVE BIOGEL PI INDICATOR 7.0 (GLOVE) ×1
GLOVE EXAM NITRILE LRG STRL (GLOVE) IMPLANT
GLOVE EXAM NITRILE MD LF STRL (GLOVE) IMPLANT
GLOVE EXAM NITRILE XL STR (GLOVE) IMPLANT
GLOVE EXAM NITRILE XS STR PU (GLOVE) IMPLANT
GLOVE SURG SS PI 7.0 STRL IVOR (GLOVE) ×6 IMPLANT
GOWN STRL REUS W/ TWL LRG LVL3 (GOWN DISPOSABLE) ×2 IMPLANT
GOWN STRL REUS W/ TWL XL LVL3 (GOWN DISPOSABLE) ×1 IMPLANT
GOWN STRL REUS W/TWL 2XL LVL3 (GOWN DISPOSABLE) IMPLANT
GOWN STRL REUS W/TWL LRG LVL3 (GOWN DISPOSABLE) ×2
GOWN STRL REUS W/TWL XL LVL3 (GOWN DISPOSABLE) ×1
KIT BASIN OR (CUSTOM PROCEDURE TRAY) ×2 IMPLANT
KIT INFUSE LRG II (Orthopedic Implant) ×2 IMPLANT
KIT ROOM TURNOVER OR (KITS) ×2 IMPLANT
MILL MEDIUM DISP (BLADE) ×2 IMPLANT
NEEDLE HYPO 18GX1.5 BLUNT FILL (NEEDLE) IMPLANT
NEEDLE HYPO 21X1.5 SAFETY (NEEDLE) ×2 IMPLANT
NEEDLE HYPO 25X1 1.5 SAFETY (NEEDLE) IMPLANT
NS IRRIG 1000ML POUR BTL (IV SOLUTION) ×2 IMPLANT
PACK LAMINECTOMY NEURO (CUSTOM PROCEDURE TRAY) ×2 IMPLANT
PAD ARMBOARD 7.5X6 YLW CONV (MISCELLANEOUS) ×6 IMPLANT
PATTIES SURGICAL .5 X1 (DISPOSABLE) ×2 IMPLANT
PATTIES SURGICAL .5 X3 (DISPOSABLE) IMPLANT
ROD 75MM SPINAL (Rod) ×4 IMPLANT
SCREW PA CREO 5.5X50 NS (Screw) ×2 IMPLANT
SCREW POLY CREO 5.5 5.5X45 (Screw) ×8 IMPLANT
SCREW POLY CREO 5.5 5.5X50 (Screw) ×2 IMPLANT
SPACER RISE 8X22 11-17MM-15 (Spacer) ×6 IMPLANT
SPONGE LAP 4X18 X RAY DECT (DISPOSABLE) IMPLANT
SPONGE NEURO XRAY DETECT 1X3 (DISPOSABLE) IMPLANT
SPONGE SURGIFOAM ABS GEL 100 (HEMOSTASIS) ×2 IMPLANT
STRIP CLOSURE SKIN 1/2X4 (GAUZE/BANDAGES/DRESSINGS) ×2 IMPLANT
SUT VIC AB 1 CT1 18XBRD ANBCTR (SUTURE) ×2 IMPLANT
SUT VIC AB 1 CT1 8-18 (SUTURE) ×2
SUT VIC AB 2-0 CP2 18 (SUTURE) ×4 IMPLANT
SUT VIC AB 3-0 SH 8-18 (SUTURE) ×2 IMPLANT
SYR 20CC LL (SYRINGE) ×2 IMPLANT
SYR 20ML ECCENTRIC (SYRINGE) ×2 IMPLANT
SYR 5ML LL (SYRINGE) IMPLANT
TOWEL OR 17X24 6PK STRL BLUE (TOWEL DISPOSABLE) ×2 IMPLANT
TOWEL OR 17X26 10 PK STRL BLUE (TOWEL DISPOSABLE) ×2 IMPLANT
TRAY FOLEY CATH 14FRSI W/METER (CATHETERS) ×2 IMPLANT
WATER STERILE IRR 1000ML POUR (IV SOLUTION) ×2 IMPLANT

## 2014-03-10 NOTE — Transfer of Care (Signed)
Immediate Anesthesia Transfer of Care Note  Patient: Kaitlin Fleming  Procedure(s) Performed: Procedure(s) with comments: LUMBAR FOUR TO FIVE, LUMBAR FIVE TO SACRAL ONE POSTERIOR LUMBAR FUSION 2 LEVEL (N/A) - L4-5 L5-S1 Posterior lumbar interbody fusion  Patient Location: PACU  Anesthesia Type:General  Level of Consciousness: awake, alert  and oriented  Airway & Oxygen Therapy: Patient Spontanous Breathing  Post-op Assessment: Report given to PACU RN  Post vital signs: Reviewed and stable  Complications: No apparent anesthesia complications

## 2014-03-10 NOTE — Anesthesia Preprocedure Evaluation (Addendum)
Anesthesia Evaluation  Patient identified by MRN, date of birth, ID band Patient awake    Reviewed: Allergy & Precautions, H&P , NPO status , Patient's Chart, lab work & pertinent test results, reviewed documented beta blocker date and time   History of Anesthesia Complications (+) PONV and history of anesthetic complications  Airway Mallampati: II  TM Distance: >3 FB Neck ROM: Full    Dental  (+) Edentulous Upper, Edentulous Lower   Pulmonary neg pulmonary ROS,  breath sounds clear to auscultation        Cardiovascular hypertension, Pt. on medications Rhythm:Regular Rate:Normal     Neuro/Psych Chronic back pain    GI/Hepatic Neg liver ROS, GERD-  Controlled,  Endo/Other  Morbid obesity  Renal/GU negative Renal ROS     Musculoskeletal  (+) Arthritis -, on steriods ,    Abdominal (+) + obese,   Peds  Hematology negative hematology ROS (+)   Anesthesia Other Findings   Reproductive/Obstetrics                            Anesthesia Physical Anesthesia Plan  ASA: II  Anesthesia Plan: General   Post-op Pain Management:    Induction: Intravenous  Airway Management Planned: Oral ETT  Additional Equipment:   Intra-op Plan:   Post-operative Plan:   Informed Consent: I have reviewed the patients History and Physical, chart, labs and discussed the procedure including the risks, benefits and alternatives for the proposed anesthesia with the patient or authorized representative who has indicated his/her understanding and acceptance.     Plan Discussed with: CRNA and Surgeon  Anesthesia Plan Comments: (Plan routine monitors, GETA)        Anesthesia Quick Evaluation

## 2014-03-10 NOTE — Progress Notes (Signed)
Pt arrived to 4N12. Alert and oriented x4. Fentayl PCA pump brought with patient. Call bell and phone within reach. Bed alarm on. Will continue to monitor.

## 2014-03-10 NOTE — Anesthesia Postprocedure Evaluation (Signed)
  Anesthesia Post-op Note  Patient: Kaitlin Fleming  Procedure(s) Performed: Procedure(s) with comments: LUMBAR FOUR TO FIVE, LUMBAR FIVE TO SACRAL ONE POSTERIOR LUMBAR FUSION 2 LEVEL (N/A) - L4-5 L5-S1 Posterior lumbar interbody fusion  Patient Location: PACU  Anesthesia Type:General  Level of Consciousness: awake, alert , oriented and patient cooperative  Airway and Oxygen Therapy: Patient Spontanous Breathing and Patient connected to nasal cannula oxygen  Post-op Pain: mild  Post-op Assessment: Post-op Vital signs reviewed, Patient's Cardiovascular Status Stable, Respiratory Function Stable, Patent Airway, No signs of Nausea or vomiting and Pain level controlled  Post-op Vital Signs: Reviewed and stable  Last Vitals:  Filed Vitals:   03/10/14 1642  BP:   Pulse: 100  Temp: 36.8 C  Resp: 15    Complications: No apparent anesthesia complications

## 2014-03-11 MED FILL — Heparin Sodium (Porcine) Inj 1000 Unit/ML: INTRAMUSCULAR | Qty: 30 | Status: AC

## 2014-03-11 MED FILL — Sodium Chloride IV Soln 0.9%: INTRAVENOUS | Qty: 2000 | Status: AC

## 2014-03-11 NOTE — Progress Notes (Signed)
Occupational Therapy Evaluation Patient Details Name: Kaitlin Fleming MRN: [id number] DOB: 11-07-1960 Today's Date: 03/11/2014    History of Present Illness 53 y.o. female admitted on 03/10/14 for L4-S1 fusion with bil L4-5 laminectomies, foraminotomies, and discectomy. PMHx- RA, 11/2013 L4-5 Laminectomy/microdiscectomy   Clinical Impression   Pt is s/p L4-S1 fusion and bil L4-5 laminectomies, foraminotomies, and discetomy surgery resulting in functional limitations due to the deficits listed below (see OT problem list). Awaiting pt to fitted and provided with back brace. Pt able to verbalize 3/3 back precautions and demonstrate adherence. Pt was at a supervision to min guard (A) level for sit to stand transfer, ambulation, and bed mobility. Pt will benefit from skilled OT acutely to increase independence and safety with ADLS to allow discharge home.    Follow Up Recommendations  No OT follow up    Equipment Recommendations  None recommended by OT    Recommendations for Other Services       Precautions / Restrictions Precautions Precautions: Back;Fall Precaution Booklet Issued: Yes (comment) Precaution Comments: Pt verbalized 3/3 precautions Required Braces or Orthoses: Spinal Brace Spinal Brace:  (Waiting for brace) Restrictions Weight Bearing Restrictions: No      Mobility Bed Mobility Overal bed mobility: Needs Assistance Bed Mobility: Rolling;Sit to Sidelying Rolling: Supervision Sidelying to sit: Supervision     Sit to sidelying: Supervision General bed mobility comments: HOB 0; used bedrail. Pt demonstrating adherence to back precautions and log roll technique without verbal cues  Transfers Overall transfer level: Needs assistance Equipment used: Rolling walker (2 wheeled) Transfers: Sit to/from Stand Sit to Stand: Min guard         General transfer comment: Min guard (A) for balance upon standing from chair. Pt reported feeling "woozy" for IV pain  medications. Verbal cues for safe hand placement with RW.    Balance Overall balance assessment: Needs assistance Sitting-balance support: Feet supported Sitting balance-Leahy Scale: Fair     Standing balance support: Bilateral upper extremity supported;During functional activity Standing balance-Leahy Scale: Fair                              ADL Overall ADL's : Needs assistance/impaired                                     Functional mobility during ADLs: Min guard;Rolling walker General ADL Comments: Waiting for pt to be fitted for back brace. Limited session in order to safely maintain back precautions. Pt demonstrated sit/stand transfer with min guard (A) for balance and ambulated to bed from chair with min guard (A) for safety     Vision                     Perception     Praxis      Pertinent Vitals/Pain Pain Assessment: 0-10 Pain Score: 7  Pain Location: low back Pain Descriptors / Indicators: Aching;Burning;Constant Pain Intervention(s): Limited activity within patient's tolerance;Monitored during session;Repositioned;Premedicated before session     Hand Dominance Right   Extremity/Trunk Assessment Upper Extremity Assessment Upper Extremity Assessment: Overall WFL for tasks assessed   Lower Extremity Assessment Lower Extremity Assessment: Defer to PT evaluation RLE: Unable to fully assess due to pain RLE Sensation:  (intact) LLE: Unable to fully assess due to pain LLE Sensation:  (intact)   Cervical / Trunk Assessment Cervical / Trunk Assessment:  Normal   Communication Communication Communication: No difficulties   Cognition Arousal/Alertness: Awake/alert Behavior During Therapy: WFL for tasks assessed/performed Overall Cognitive Status: Within Functional Limits for tasks assessed                     General Comments       Exercises Exercises: General Lower Extremity     Shoulder Instructions       Home Living Family/patient expects to be discharged to:: Private residence Living Arrangements: Spouse/significant other;Children Available Help at Discharge: Family;Available 24 hours/day Type of Home: House (farm with cows, horses, chickens, etc...) Home Access: Stairs to enter [employer] of Steps: 5 Entrance Stairs-Rails: Right;Left;Can reach both Home Layout: One level     Bathroom Shower/Tub: Walk-in shower;Door   [employer] Toilet: Handicapped height     Home Equipment: Shower seat;Grab bars - tub/shower;Grab bars - toilet;Hand held shower head;Cane - quad;Walker - 2 wheels Adaptive Equipment: Reacher        Prior Functioning/Environment Level of Independence: Independent with assistive device(s)        Comments: Pt has RA using quad cane    OT Diagnosis: Generalized weakness;Acute pain   OT Problem List: Decreased strength;Decreased range of motion;Decreased activity tolerance;Impaired balance (sitting and/or standing);Decreased coordination;Decreased safety awareness;Obesity;Pain   OT Treatment/Interventions: Self-care/ADL training;Therapeutic exercise;DME and/or AE instruction;Therapeutic activities;Patient/family education;Balance training    OT Goals(Current goals can be found in the care plan section) Acute Rehab OT Goals Patient Stated Goal: to not need RW OT Goal Formulation: With patient Time For Goal Achievement: 03/25/14 Potential to Achieve Goals: Good ADL Goals Pt Will Perform Lower Body Bathing: with modified independence;sit to/from stand (adhering to back precautions) Pt Will Perform Lower Body Dressing: with modified independence;sit to/from stand (adhering to back precautions) Pt Will Transfer to Toilet: with supervision;ambulating;bedside commode;grab bars (adhering to back precautions) Pt Will Perform Toileting - Clothing Manipulation and hygiene: with supervision;sit to/from stand (adhering to back precautions) Additional ADL Goal  #1: Pt will don/doff brace independently while seated EOB.  OT Frequency: Min 2X/week   Barriers to D/C:            Co-evaluation              End of Session Equipment Utilized During Treatment: Gait belt;Rolling walker Nurse Communication: Mobility status;Precautions;Other (comment) (Bed change complete)  Activity Tolerance: Patient tolerated treatment well Patient left: in bed;with call bell/phone within reach;with family/visitor present   Time: 1027-2536 OT Time Calculation (min): 21 min Charges:    G-Codes:    Nils Pyle 03/28/14, 11:24 AM

## 2014-03-11 NOTE — Progress Notes (Signed)
03/11/14 1000  OT Time Calculation  OT Start Time 0949  OT Stop Time 1028  OT Time Calculation (min) 39 min  OT General Charges  $OT Visit 1 Procedure  OT Evaluation  $Initial OT Evaluation Tier I 1 Procedure  OT Treatments  $Self Care/Home Management  38-52 mins   OT spoke directly to Dr Jeral Fruit in person with PT Larita Fife regarding OOB at this time for OT evaluation  MD cleared pt to be oob prior to brace arrival  I agree with the following treatment note after reviewing documentation.   Mateo Flow OTR/L Pager: [phone] Office: [phone] .

## 2014-03-11 NOTE — Progress Notes (Signed)
OT Cancellation Note  Patient Details Name: Kaitlin Fleming MRN: [id number] DOB: 1960-11-14   Cancelled Treatment:    Reason Eval/Treat Not Completed: Patient not medically ready (no back brace- awaiting clarification or arrival of a brace)   Harolyn Rutherford Pager: [phone]  03/11/2014, 8:48 AM

## 2014-03-11 NOTE — Evaluation (Signed)
Physical Therapy Evaluation Patient Details Name: Kaitlin Fleming MRN: [id number] DOB: 12/17/1960 Today's Date: 03/11/2014   History of Present Illness  Adm 03/10/14 for L4-S1 fusion with bil L4-5 laminectomies, foraminotomies, and discectomy. PMHx- RA, 11/2013 L4-5 Laminectomy/microdiscectomy  Clinical Impression  Patient is s/p above surgery resulting in the deficits listed below (see PT Problem List). Although pt s/p back surgery in 11/2013, she did not use a RW and requires education on safe use of DME. Patient will benefit from skilled PT to increase their independence and safety with mobility (while adhering to their precautions) to allow discharge       Follow Up Recommendations No PT follow up;Supervision/Assistance - 24 hour    Equipment Recommendations  None recommended by PT    Recommendations for Other Services OT consult     Precautions / Restrictions Precautions Precautions: Back;Fall Precaution Booklet Issued: Yes (comment) Precaution Comments: pt able to verbalize 3/3 precautions from prior surgery Required Braces or Orthoses: Spinal Brace Spinal Brace:  (Pt to have brace ordered; Dr Jeral Fruit asked no to hold PT) Restrictions Weight Bearing Restrictions: No      Mobility  Bed Mobility Overal bed mobility: Needs Assistance Bed Mobility: Rolling;Sidelying to Sit Rolling: Supervision Sidelying to sit: Supervision       General bed mobility comments: HOB 0; +rail; vc for technique to maintain back precautions  Transfers Overall transfer level: Needs assistance Equipment used: Rolling walker (2 wheeled) Transfers: Sit to/from Stand Sit to Stand: Min assist         General transfer comment: vc for safe use of DME; assist to extend hips/knees (limited due to pain)  Ambulation/Gait Ambulation/Gait assistance: Min guard Ambulation Distance (Feet): 4 Feet Assistive device: Rolling walker (2 wheeled) Gait Pattern/deviations: Step-through pattern;Decreased  stride length Gait velocity: slow   General Gait Details: limited distance due to "woozy" and pt not wanting to overdo things before brace arrives (although she reports Dr Jeral Fruit also talked to her about getting up without brace)  Stairs            Wheelchair Mobility    Modified Rankin (Stroke Patients Only)       Balance Overall balance assessment:  (incr risk due to dizziness (? meds))                                           Pertinent Vitals/Pain Pain Assessment: 0-10 Pain Score: 7  Pain Location: back Pain Descriptors / Indicators: Aching Pain Intervention(s): Limited activity within patient's tolerance;Monitored during session;Premedicated before session;Repositioned (pt requested muscle relaxers and RN gave)    Home Living Family/patient expects to be discharged to:: Private residence Living Arrangements: Spouse/significant other Available Help at Discharge: Family;Available 24 hours/day (daughter and mother during day) Type of Home: House Home Access: Stairs to enter Entrance Stairs-Rails: Right;Left;Can reach both Entrance Stairs-Number of Steps: 5 Home Layout: One level Home Equipment: Shower seat;Adaptive equipment;Walker - 2 wheels;Grab bars - toilet;Hand held shower head;Crutches;Cane - quad (comfort height toilet)      Prior Function Level of Independence: Independent with assistive device(s)         Comments: using quad cane; only wore slip on shoes     Hand Dominance   Dominant Hand: Right    Extremity/Trunk Assessment   Upper Extremity Assessment: Overall WFL for tasks assessed;Defer to OT evaluation  Lower Extremity Assessment: RLE deficits/detail;LLE deficits/detail;Generalized weakness      Cervical / Trunk Assessment: Normal  Communication   Communication: No difficulties  Cognition Arousal/Alertness: Lethargic;Suspect due to medications Behavior During Therapy: [facility] for tasks  assessed/performed Overall Cognitive Status: Within Functional Limits for tasks assessed                      General Comments General comments (skin integrity, edema, etc.): daughter present    Exercises General Exercises - Lower Extremity Ankle Circles/Pumps: AROM;Both;10 reps      Assessment/Plan    PT Assessment Patient needs continued PT services  PT Diagnosis Difficulty walking;Acute pain   PT Problem List Decreased activity tolerance;Decreased mobility;Decreased knowledge of use of DME;Decreased knowledge of precautions;Pain  PT Treatment Interventions DME instruction;Gait training;Stair training;Functional mobility training;Therapeutic activities;Patient/family education   PT Goals (Current goals can be found in the Care Plan section) Acute Rehab PT Goals Patient Stated Goal: to not need RW PT Goal Formulation: With patient Time For Goal Achievement: 03/16/14 Potential to Achieve Goals: Good    Frequency Min 5X/week   Barriers to discharge        Co-evaluation               End of Session Equipment Utilized During Treatment: Gait belt Activity Tolerance: Patient tolerated treatment well Patient left: in chair;with call bell/phone within reach;with chair alarm set;with family/visitor present Nurse Communication: Mobility status;Other (comment) (need for muscle relaxers; OK to get up without brace per MD)         Time: 3557-3220 PT Time Calculation (min): 25 min   Charges:   PT Evaluation $Initial PT Evaluation Tier I: 1 Procedure PT Treatments $Gait Training: 8-22 mins   PT G Codes:          Drue Harr 02-Apr-2014, 10:13 AM Pager [phone]

## 2014-03-11 NOTE — Progress Notes (Signed)
Unable to locate Fentanyl PCA in pyxis for waste.  Whole syringe wasted with Lennie Odor RN. Sondra Come, RN 03/11/2014 10:08 AM

## 2014-03-11 NOTE — Progress Notes (Signed)
Subjective: Patient reports incisional pain  Objective: Vital signs in last 24 hours: Temp:  [97.5 F (36.4 C)-99.6 F (37.6 C)] 99.6 F (37.6 C) (10/29 0815) Pulse Rate:  [87-119] 102 (10/29 0815) Resp:  [12-24] 24 (10/29 0850) BP: (106-135)/(59-85) 116/62 mmHg (10/29 0815) SpO2:  [96 %-100 %] 98 % (10/29 0850) Weight:  [92.534 kg (204 lb)] 92.534 kg (204 lb) (10/28 1658)  Intake/Output from previous day: 10/28 0701 - 10/29 0700 In: 4000 [I.V.:3500; Blood:250; IV Piggyback:250] Out: 1995 [Urine:1145; Drains:200; Blood:650] Intake/Output this shift: Total I/O In: 75 [I.V.:75] Out: -   No weakness. Foley out pt to see  Lab Results: No results found for this basename: WBC, HGB, HCT, PLT,  in the last 72 hours BMET No results found for this basename: NA, K, CL, CO2, GLUCOSE, BUN, CREATININE, CALCIUM,  in the last 72 hours  Studies/Results: Dg Lumbar Spine 2-3 Views  03/10/2014   CLINICAL DATA:  L4-S1 PLIF.  EXAM: DG C-ARM 61-120 MIN; LUMBAR SPINE - 2-3 VIEW  FLUOROSCOPY TIME:  0 min 29 seconds.  COMPARISON:  12/31/2013.  FINDINGS: Intraoperative fluoroscopic spot views of the lower lumbar spine are provided in the AP and lateral projections. Pedicle screws and interbody spacers are seen at L4-5 and L5-S1.  IMPRESSION: Intraoperative visualization of L4-S1 PLIF.   Electronically Signed   By: Leanna Battles M.D.   On: 03/10/2014 15:15   Dg Lumbar Spine 2-3 Views  03/10/2014   CLINICAL DATA:  L4-5 and L5-S1 PLIF.  EXAM: LUMBAR SPINE - 2-3 VIEW  COMPARISON:  Lumbar MRI 01/14/2014.  FINDINGS: Two cross-table lateral views are submitted. On the first image at 1140 hr, there are skin spreaders posteriorly extending from L4 through the upper sacrum. Posterior localizing instruments are present over the L5 pedicles and posterior aspect of the L5-S1 disc space.  On the second image at 1240 hr, the more inferior instrument has been advanced overlying the anterior aspect of the L5-S1 disc. No  other significant changes are evident.  IMPRESSION: Intraoperative localization views as described.   Electronically Signed   By: Roxy Horseman M.D.   On: 03/10/2014 15:11   Dg C-arm 1-60 Min  03/10/2014   CLINICAL DATA:  L4-S1 PLIF.  EXAM: DG C-ARM 61-120 MIN; LUMBAR SPINE - 2-3 VIEW  FLUOROSCOPY TIME:  0 min 29 seconds.  COMPARISON:  12/31/2013.  FINDINGS: Intraoperative fluoroscopic spot views of the lower lumbar spine are provided in the AP and lateral projections. Pedicle screws and interbody spacers are seen at L4-5 and L5-S1.  IMPRESSION: Intraoperative visualization of L4-S1 PLIF.   Electronically Signed   By: Leanna Battles M.D.   On: 03/10/2014 15:15    Assessment/Plan: Off fentanyil  LOS: 1 day     Kaitlin Fleming M 03/11/2014, 9:30 AM

## 2014-03-11 NOTE — Progress Notes (Signed)
CARE MANAGEMENT NOTE 03/11/2014  Patient:  Kaitlin Fleming, Kaitlin Fleming   Account Number:  [id number]  Date Initiated:  03/11/2014  Documentation initiated by:  Jiles Crocker  Subjective/Objective Assessment:   ADMITTED FOR SURGERY     Action/Plan:   CM FOLLOWING FOR DCP   Anticipated DC Date:  03/15/2014   Anticipated DC Plan:  AWAITING FOR PT/OT EVALS FOR DISPOSITION NEEDS WITH ATTENDING MD APPROVAL     DC Planning Services  CM consult          Status of service:  In process, will continue to follow Medicare Important Message given?   (If response is "NO", the following Medicare IM given date fields will be blank)  Per UR Regulation:  Reviewed for med. necessity/level of care/duration of stay  Comments:  10/29/2015Abelino Derrick RN,BSN,MHA [phone]

## 2014-03-11 NOTE — Op Note (Signed)
NAMEDIANNIE, WILLNER NO.:  [id number]  MEDICAL RECORD NO.:  [id number]  LOCATION:  4N12C                        FACILITY:  [facility]  PHYSICIAN:  Hilda Lias, M.D.   DATE OF BIRTH:  [birth year]  DATE OF PROCEDURE:  03/10/2014 DATE OF DISCHARGE:                              OPERATIVE REPORT   PREOPERATIVE DIAGNOSIS:  Degenerative disk disease L4-5, L5-S1 with chronic bilateral radiculopathy.  Status post decompression of the left 4-5, 5-1.  POSTOPERATIVE DIAGNOSIS:  Degenerative disk disease L4-5, L5-S1 with chronic bilateral radiculopathy.  Status post decompression of the left 4-5, 5-1.  PROCEDURE:  Bilateral L4-L5 laminectomy, bilateral L4-L5 facetectomy. Lysis of adhesions on the left side at L4-5, 5-1.  Bilateral L4-L5 diskectomy beyond normal to introduce 2 cages.  Right L5-S1 diskectomy, insertion of single cage.  Pedicle screws at L4-5, 5-1.  Posterolateral arthrodesis from L4-S1.  Cell Saver.  C-arm.  SURGEON:  Hilda Lias, MD  ASSISTANT:  Dr. Marikay Alar.  CLINICAL HISTORY:  Ms. Wignall is a lady who was seen by me and taken to surgery back in July because of left leg pain.  The patient had quite a bit of degenerative disk disease, and at that time, she had no pain whatsoever in the right leg.  After surgery, she did well, but she came to my office complaining of some headache, more back pain, and this pain right now more present to the right side, to the left side.  She failed conservative treatment.  X-rays show worsening of degenerative disk disease at the level of 4-5, 5-1.  The patient has a history of rheumatoid arthritis.  She had been taking prednisone for many years. In view of no improvement, surgery was advised.  The patient knew the risk and benefit of the surgery.  PROCEDURE IN DETAIL:  The patient was taken to the OR, and after intubation, she was positioned on prone manner.  The back was cleaned with Betadine and DuraPrep.   Midline incision following the previous one was made.  It was difficult to feel any bony structure because of the adipose tissue.  Nevertheless, we will follow the previous one and we were able to retract laterally at the level 4-5, 5-1.  Immediately when we found that at the level 4-5, 5-1 to the left, the patient had quite a bit of adhesion.  We proceeded with removal of spinous process of 4-5 and the lamina bilaterally.  At the level of 4-5, we were able to retract the thecal sac in the right side first and total diskectomy medial and lateral was accomplished.  In the left side, we had to do at least lysis of adhesions, and we were able to get into the disk space with also diskectomy was accomplished from midline to the left.  Then, the main problem was at the level of 5-1 to the left _where she had a lot of adhesions_________.  Micro dissection was carried out, and at the end, we will have good decompression of the L5-S1 nerve root.  We attempted to get into the disk space in the left side _but it was_________  almost impossible to retract the thecal sac.  Nevertheless, we have a  good decompression of the l5-s1 nerve roots.  We went to the right side and diskectomy was accomplished. The endplates were removed and a single cage was introduced.  The cage was expanded.  The cage had autograft as well as BMP.  The rest of the disk space was filled up with BMP autograft.  At the level of 4-5, we introduced a 2 cage which were explanted all the way up to 17.  Most cages were lordotic.  The rest of the disk was filled up also with BMP and autograft.  Then, using the C-arm and AP view and then a lateral view, we were able to probe the pedicle for L4, L5, S1.  At the end, we introduced 6 screws . Sizes, __________ 5.5 x 45 at L4-5 and 5.5 x 50 at the level of S1.  The screw were kept in place with a rod and caps. Cross-link from right to left . then__________ we went laterally and we removed  the periosteum of 4-5, 5-1.  A mix of BMP and autograft was used for arthrodesis.  Valsalva maneuver was negative twice.  The area was irrigated.  Vancomycin powder was left in the surgical site.  Small drain was left.  The wound was closed with Vicryl and staples.          ______________________________ Hilda Lias, M.D.     EB/MEDQ  D:  03/10/2014  T:  03/11/2014  Job:  [id number]

## 2014-03-11 NOTE — Progress Notes (Signed)
PT Cancellation Note  Patient Details Name: Kaitlin Fleming MRN: [id number] DOB: [birth year]   Cancelled Treatment:    Reason Eval/Treat Not Completed: RN reports pt states she was to have a brace. RN reports no brace has been ordered and plans to contact the physician to clarify.   [name] 03/11/2014, 8:34 AM Pager [phone]

## 2014-03-12 MED ORDER — BISACODYL 10 MG RE SUPP: 10.0000 mg | Freq: Every day | RECTAL | Status: DC | PRN

## 2014-03-12 NOTE — Progress Notes (Signed)
Physical Therapy Treatment Patient Details Name: Kaitlin Fleming MRN: [id number] DOB: 1960-07-14 Today's Date: 03/12/2014    History of Present Illness 53 y.o. female admitted on 03/10/14 for L4-S1 fusion with bil L4-5 laminectomies, foraminotomies, and discectomy. PMHx- RA, 11/2013 L4-5 Laminectomy/microdiscectomy    PT Comments    Patient feel much more relief with use of brace this session. Patient educated on brace wear and precautions. Patient able to complete long hall ambulation and stair training this AM. Patient safe to D/C from a mobility standpoint based on progression towards goals set on PT eval.    Follow Up Recommendations  No PT follow up;Supervision/Assistance - 24 hour     Equipment Recommendations  None recommended by PT    Recommendations for Other Services       Precautions / Restrictions Precautions Precautions: Back;Fall Precaution Comments: Pt verbalized 3/3 precautions Required Braces or Orthoses: Spinal Brace Spinal Brace: Lumbar corset Restrictions Weight Bearing Restrictions: No    Mobility  Bed Mobility Overal bed mobility: Needs Assistance Bed Mobility: Rolling;Sit to Sidelying Rolling: Supervision Sidelying to sit: Supervision     Sit to sidelying: Supervision General bed mobility comments: HOB 0; used bedrail. Pt demonstrating adherence to back precautions and log roll technique without verbal cues  Transfers Overall transfer level: Needs assistance Equipment used: Rolling walker (2 wheeled) Transfers: Sit to/from Stand Sit to Stand: Min guard         General transfer comment: Min guard (A) for balance upon standing. Pt bending forward and leaning to right side. Verbal cues for safe hand placement and not to pull up from RW  Ambulation/Gait Ambulation/Gait assistance: Supervision Ambulation Distance (Feet): 600 Feet Assistive device: Rolling walker (2 wheeled) Gait Pattern/deviations: Step-through pattern;Decreased stride  length   Gait velocity interpretation: Below normal speed for age/gender General Gait Details: Cues for posture but otherwise safe technique with use of RW   Stairs Stairs: Yes Stairs assistance: Min guard Stair Management: Step to pattern;Forwards;Two rails Number of Stairs: 5 General stair comments: Cues for sequency and technique  Wheelchair Mobility    Modified Rankin (Stroke Patients Only)       Balance Overall balance assessment: Needs assistance Sitting-balance support: Feet supported Sitting balance-Leahy Scale: Good     Standing balance support: Bilateral upper extremity supported;During functional activity Standing balance-Leahy Scale: Fair                      Cognition Arousal/Alertness: Awake/alert Behavior During Therapy: WFL for tasks assessed/performed Overall Cognitive Status: Within Functional Limits for tasks assessed                      Exercises      General Comments        Pertinent Vitals/Pain Pain Assessment: 0-10 Pain Score: 3  Pain Location: back Pain Descriptors / Indicators: Sore Pain Intervention(s): Monitored during session    Home Living                      Prior Function            PT Goals (current goals can now be found in the care plan section) Acute Rehab PT Goals Patient Stated Goal: to not need RW Progress towards PT goals: Progressing toward goals    Frequency  Min 5X/week    PT Plan Current plan remains appropriate    Co-evaluation             End of  Session Equipment Utilized During Treatment: Back brace Activity Tolerance: Patient tolerated treatment well Patient left: with call bell/phone within reach;in chair;with family/visitor present     Time: 0800-0825 PT Time Calculation (min): 25 min  Charges:  $Gait Training: 8-22 mins $Therapeutic Activity: 8-22 mins                    G Codes:      Fredrich Birks 03/12/2014, 9:45  AM 03/12/2014 Fredrich Birks PTA [phone] pager [phone] office

## 2014-03-12 NOTE — Progress Notes (Signed)
Patient ID: Kaitlin Fleming, female   DOB: Sep 19, 1960, 53 y.o.   MRN: [id number] Much better. Ambulating. Wants to go home

## 2014-03-12 NOTE — Discharge Summary (Signed)
Physician Discharge Summary  Patient ID: Kaitlin Fleming MRN: [id number] DOB/AGE: [birth year] 53 y.o.  Admit date: 03/10/2014 Discharge date: 03/12/2014  Admission Diagnoses:l4-5,l5-s1 degenerative disc disease . Lumbar radiculopathies  Discharge Diagnoses:  Active Problems:   Lumbar degenerative disc disease   Discharged Condition: ambulating, no weakness  Hospital Course: surgery  Consults: none  Significant Diagnostic Studies: lumbar mri  Treatments: l4 to s1 fusion  Discharge Exam: Blood pressure 130/66, pulse 90, temperature 98.1 F (36.7 C), temperature source Oral, resp. rate 20, height 5\' 7"  (1.702 m), weight 92.534 kg (204 lb), SpO2 98.00%. Ambulating, no weakness  Disposition: 01-Home or Self Care     Medication List    ASK your doctor about these medications       acetaminophen 500 MG tablet  Commonly known as:  TYLENOL  Take 1,000 mg by mouth every 6 (six) hours as needed.     clindamycin 300 MG capsule  Commonly known as:  CLEOCIN  Take 300 mg by mouth 3 (three) times daily. Pt states will complete treatment by Sunday 03/07/2014     escitalopram 10 MG tablet  Commonly known as:  LEXAPRO  Take 10 mg by mouth daily.     fenofibrate micronized 134 MG capsule  Commonly known as:  LOFIBRA  Take 134 mg by mouth daily before breakfast.     hydrochlorothiazide 25 MG tablet  Commonly known as:  HYDRODIURIL  Take 25 mg by mouth daily.     leflunomide 10 MG tablet  Commonly known as:  ARAVA  Take 10 mg by mouth daily.     oxyCODONE-acetaminophen 10-325 MG per tablet  Commonly known as:  PERCOCET  Take 1 tablet by mouth every 6 (six) hours as needed for pain.     predniSONE 10 MG tablet  Commonly known as:  DELTASONE  Take 10 mg by mouth daily with breakfast.         Signed: Gearl Kimbrough M 03/12/2014, 12:31 PM

## 2014-03-12 NOTE — Progress Notes (Signed)
I agree with the following treatment note after reviewing documentation.   Kalis Friese, Brynn OTR/L Pager: [phone] Office: [phone] .   

## 2014-03-12 NOTE — Progress Notes (Signed)
Occupational Therapy Treatment Patient Details Name: Kaitlin Fleming MRN: [id number] DOB: June 28, 1960 Today's Date: 03/12/2014    History of present illness 53 y.o. female admitted on 03/10/14 for L4-S1 fusion with bil L4-5 laminectomies, foraminotomies, and discectomy. PMHx- RA, 11/2013 L4-5 Laminectomy/microdiscectomy   OT comments  Pt progressing well with mobility. Pt demonstrating right lateral lean and excessive bending forward to complete sit to stand transfer. Pt needed verbal cues to stand up tall and avoid bending. Otherwise, pt demonstrating good adherence to back precautions during ADLs. Pt will benefit from continued OT services to increase balance/safety and independence with ADLs.  Follow Up Recommendations  No OT follow up    Equipment Recommendations  None recommended by OT    Recommendations for Other Services      Precautions / Restrictions Precautions Precautions: Back;Fall Required Braces or Orthoses: Spinal Brace Spinal Brace: Lumbar corset Restrictions Weight Bearing Restrictions: No       Mobility Bed Mobility Overal bed mobility: Needs Assistance Bed Mobility: Rolling;Sit to Sidelying Rolling: Supervision       Sit to sidelying: Supervision General bed mobility comments: HOB 0; used bedrail. Pt demonstrating adherence to back precautions and log roll technique without verbal cues  Transfers Overall transfer level: Needs assistance Equipment used: Rolling walker (2 wheeled) Transfers: Sit to/from Stand Sit to Stand: Min guard         General transfer comment: Min guard (A) for balance upon standing. Pt bending forward and leaning to right side. Verbal cues to stand up tall and maintain back precautions.    Balance Overall balance assessment: Needs assistance Sitting-balance support: Feet supported Sitting balance-Leahy Scale: Good     Standing balance support: Bilateral upper extremity supported;During functional activity Standing  balance-Leahy Scale: Fair                     ADL Overall ADL's : Needs assistance/impaired     Grooming: Wash/dry hands;Min guard;Standing           Upper Body Dressing : Modified independent;Sitting Upper Body Dressing Details (indicate cue type and reason): Pt able to doff back brace without (A) or verbal cues     Toilet Transfer: Min guard;Ambulation;Regular Toilet;RW Toilet Transfer Details (indicate cue type and reason): Verbal cues for hand placement and to maintain back precautions Toileting- Clothing Manipulation and Hygiene: Supervision/safety;Adhering to back precautions;Sitting/lateral lean       Functional mobility during ADLs: Min guard;Rolling walker General ADL Comments: pt stating she had completed all ADLs prior to therapists arrival. Pt practice toilet transfer, doffing brace and bed mobility. Pt demonstrating adherence to back precautions.       Vision                     Perception     Praxis      Cognition   Behavior During Therapy: WFL for tasks assessed/performed Overall Cognitive Status: Within Functional Limits for tasks assessed                       Extremity/Trunk Assessment               Exercises     Shoulder Instructions       General Comments      Pertinent Vitals/ Pain       Pain Assessment: 0-10 Pain Score: 3  Pain Location: low back Pain Descriptors / Indicators: Sore Pain Intervention(s): Monitored during session;Repositioned  Home Living  Prior Functioning/Environment              Frequency Min 2X/week     Progress Toward Goals  OT Goals(current goals can now be found in the care plan section)  Progress towards OT goals: Progressing toward goals  Acute Rehab OT Goals Patient Stated Goal: to not need RW OT Goal Formulation: With patient Time For Goal Achievement: 03/25/14 Potential to Achieve Goals: Good ADL  Goals Pt Will Perform Lower Body Bathing: with modified independence;sit to/from stand Pt Will Perform Lower Body Dressing: with modified independence;sit to/from stand Pt Will Transfer to Toilet: with supervision;ambulating;bedside commode;grab bars Pt Will Perform Toileting - Clothing Manipulation and hygiene: with supervision;sit to/from stand Additional ADL Goal #1: Pt will don/doff brace independently while seated EOB.  Plan Discharge plan remains appropriate    Co-evaluation                 End of Session Equipment Utilized During Treatment: Gait belt;Rolling walker;Back brace   Activity Tolerance Patient tolerated treatment well   Patient Left in bed;with call bell/phone within reach;with bed alarm set;with family/visitor present   Nurse Communication Mobility status;Precautions        Time: 2426-8341 OT Time Calculation (min): 11 min  Charges:    Nils Pyle 03/12/2014, 9:25 AM

## 2014-03-12 NOTE — Progress Notes (Signed)
Discharge orders received.  Discharge instructions and follow-up appointments reviewed with the patient and her mother.  VSS.  IV and hemovac removed and education complete.  Transported out via wheelchair, family present. Sondra Come, RN

## 2014-03-21 ENCOUNTER — Emergency Department (HOSPITAL_COMMUNITY)
Admission: EM | Admit: 2014-03-21 | Discharge: 2014-03-21 | Disposition: A | Discharge status: Home / Self Care | Payer: BC Managed Care – PPO | Attending: Emergency Medicine | Admitting: Emergency Medicine

## 2014-03-21 ENCOUNTER — Encounter (HOSPITAL_COMMUNITY): Payer: Self-pay | Admitting: Emergency Medicine

## 2014-03-21 DIAGNOSIS — M545 Low back pain, unspecified: Secondary | ICD-10-CM

## 2014-03-21 DIAGNOSIS — F329 Major depressive disorder, single episode, unspecified: Secondary | ICD-10-CM | POA: Diagnosis not present

## 2014-03-21 DIAGNOSIS — G8929 Other chronic pain: Secondary | ICD-10-CM | POA: Diagnosis not present

## 2014-03-21 DIAGNOSIS — Z87442 Personal history of urinary calculi: Secondary | ICD-10-CM | POA: Diagnosis not present

## 2014-03-21 DIAGNOSIS — Z8709 Personal history of other diseases of the respiratory system: Secondary | ICD-10-CM | POA: Insufficient documentation

## 2014-03-21 DIAGNOSIS — M129 Arthropathy, unspecified: Secondary | ICD-10-CM | POA: Insufficient documentation

## 2014-03-21 DIAGNOSIS — Z880 Allergy status to penicillin: Secondary | ICD-10-CM | POA: Insufficient documentation

## 2014-03-21 DIAGNOSIS — Z79899 Other long term (current) drug therapy: Secondary | ICD-10-CM | POA: Insufficient documentation

## 2014-03-21 DIAGNOSIS — Z8639 Personal history of other endocrine, nutritional and metabolic disease: Secondary | ICD-10-CM | POA: Insufficient documentation

## 2014-03-21 DIAGNOSIS — Z8701 Personal history of pneumonia (recurrent): Secondary | ICD-10-CM | POA: Diagnosis not present

## 2014-03-21 DIAGNOSIS — Z79891 Long term (current) use of opiate analgesic: Secondary | ICD-10-CM | POA: Diagnosis not present

## 2014-03-21 DIAGNOSIS — M069 Rheumatoid arthritis, unspecified: Secondary | ICD-10-CM | POA: Insufficient documentation

## 2014-03-21 DIAGNOSIS — K219 Gastro-esophageal reflux disease without esophagitis: Secondary | ICD-10-CM | POA: Diagnosis not present

## 2014-03-21 MED ORDER — HYDROCODONE-ACETAMINOPHEN 5-325 MG PO TABS
1.0000 | ORAL_TABLET | Freq: Once | ORAL | Status: AC
  Administered 2014-03-21: 1 via ORAL
  Filled 2014-03-21: qty 1

## 2014-03-21 MED ORDER — HYDROMORPHONE HCL 1 MG/ML IJ SOLN
1.0000 mg | Freq: Once | INTRAMUSCULAR | Status: AC
  Administered 2014-03-21: 1 mg via INTRAMUSCULAR
  Filled 2014-03-21: qty 1

## 2014-03-21 NOTE — ED Notes (Signed)
MD at bedside. 

## 2014-03-21 NOTE — ED Notes (Signed)
Pt had back surgery oct 28 and now presents with complaints of lower back pain with difficulty standing and walking. Dressing on incision/staples soiled, being changed now. Incision red, inflamed.

## 2014-03-21 NOTE — ED Notes (Signed)
Pt was able to stand and walk after the surgery oct 28 but the pain has progressively gotten worse and she is unable to walk any longer without extreme pain

## 2014-03-21 NOTE — Discharge Instructions (Signed)
Continue taking hydromorphone (Dilaudid) and diazepam (Valium) as prescribed. You may also take hydrocodone-acetaminophen (Vicodin) every 6 hours for pain control. Follow up with Dr. Jeral Fruit.  Back Pain, Adult Low back pain is very common. About 1 in 5 people have back pain.The cause of low back pain is rarely dangerous. The pain often gets better over time.About half of people with a sudden onset of back pain feel better in just 2 weeks. About 8 in 10 people feel better by 6 weeks.  CAUSES Some common causes of back pain include:  Strain of the muscles or ligaments supporting the spine.  Wear and tear (degeneration) of the spinal discs.  Arthritis.  Direct injury to the back. DIAGNOSIS Most of the time, the direct cause of low back pain is not known.However, back pain can be treated effectively even when the exact cause of the pain is unknown.Answering your caregiver's questions about your overall health and symptoms is one of the most accurate ways to make sure the cause of your pain is not dangerous. If your caregiver needs more information, he or she may order lab work or imaging tests (X-rays or MRIs).However, even if imaging tests show changes in your back, this usually does not require surgery. HOME CARE INSTRUCTIONS For many people, back pain returns.Since low back pain is rarely dangerous, it is often a condition that people can learn to [facility] their own.   Remain active. It is stressful on the back to sit or stand in one place. Do not sit, drive, or stand in one place for more than 30 minutes at a time. Take short walks on level surfaces as soon as pain allows.Try to increase the length of time you walk each day.  Do not stay in bed.Resting more than 1 or 2 days can delay your recovery.  Do not avoid exercise or work.Your body is made to move.It is not dangerous to be active, even though your back may hurt.Your back will likely heal faster if you return to being active  before your pain is gone.  Pay attention to your body when you bend and lift. Many people have less discomfortwhen lifting if they bend their knees, keep the load close to their bodies,and avoid twisting. Often, the most comfortable positions are those that put less stress on your recovering back.  Find a comfortable position to sleep. Use a firm mattress and lie on your side with your knees slightly bent. If you lie on your back, put a pillow under your knees.  Only take over-the-counter or prescription medicines as directed by your caregiver. Over-the-counter medicines to reduce pain and inflammation are often the most helpful.Your caregiver may prescribe muscle relaxant drugs.These medicines help dull your pain so you can more quickly return to your normal activities and healthy exercise.  Put ice on the injured area.  Put ice in a plastic bag.  Place a towel between your skin and the bag.  Leave the ice on for 15-20 minutes, 03-04 times a day for the first 2 to 3 days. After that, ice and heat may be alternated to reduce pain and spasms.  Ask your caregiver about trying back exercises and gentle massage. This may be of some benefit.  Avoid feeling anxious or stressed.Stress increases muscle tension and can worsen back pain.It is important to recognize when you are anxious or stressed and learn ways to manage it.Exercise is a great option. SEEK MEDICAL CARE IF:  You have pain that is not relieved with  rest or medicine.  You have pain that does not improve in 1 week.  You have new symptoms.  You are generally not feeling well. SEEK IMMEDIATE MEDICAL CARE IF:   You have pain that radiates from your back into your legs.  You develop new bowel or bladder control problems.  You have unusual weakness or numbness in your arms or legs.  You develop nausea or vomiting.  You develop abdominal pain.  You feel faint. Document Released: 04/30/2005 Document Revised: 10/30/2011  Document Reviewed: 09/01/2013 [facility] Patient Information 2015 [place], [place]. This information is not intended to replace advice given to you by your health care provider. Make sure you discuss any questions you have with your health care provider. Incision Care An incision is when a surgeon cuts into your body tissues. After surgery, the incision needs to be cared for properly to prevent infection.  HOME CARE INSTRUCTIONS   Take all medicine as directed by your caregiver. Only take over-the-counter or prescription medicines for pain, discomfort, or fever as directed by your caregiver.  Do not remove your bandage (dressing) or get your incision wet until your surgeon gives you permission. In the event that your dressing becomes wet, dirty, or starts to smell, change the dressing and call your surgeon for instructions as soon as possible.  Take showers. Do not take tub baths, swim, or do anything that may soak the wound until it is healed.  Resume your normal diet and activities as directed or allowed.  Avoid lifting any weight until you are instructed otherwise.  Use anti-itch antihistamine medicine as directed by your caregiver. The wound may itch when it is healing. Do not pick or scratch at the wound.  Follow up with your caregiver for stitch (suture) or staple removal as directed.  Drink enough fluids to keep your urine clear or pale yellow. SEEK MEDICAL CARE IF:   You have redness, swelling, or increasing pain in the wound that is not controlled with medicine.  You have drainage, blood, or pus coming from the wound that lasts longer than 1 day.  You develop muscle aches, chills, or a general ill feeling.  You notice a bad smell coming from the wound or dressing.  Your wound edges separate after the sutures, staples, or skin adhesive strips have been removed.  You develop persistent nausea or vomiting. SEEK IMMEDIATE MEDICAL CARE IF:   You have a fever.  You develop a  rash.  You develop dizzy episodes or faint while standing.  You have difficulty breathing.  You develop any reaction or side effects to medicine given. MAKE SURE YOU:   Understand these instructions.  Will watch your condition.  Will get help right away if you are not doing well or get worse. Document Released: 11/17/2004 Document Revised: 07/23/2011 Document Reviewed: 06/24/2013 [facility] Patient Information 2015 [place], [place]. This information is not intended to replace advice given to you by your health care provider. Make sure you discuss any questions you have with your health care provider.

## 2014-03-21 NOTE — ED Provider Notes (Signed)
CSN: [id number]     Arrival date & time 03/21/14  1019 History   First MD Initiated Contact with Patient 03/21/14 1025     Chief Complaint  Patient presents with  . Back Pain     (Consider location/radiation/quality/duration/timing/severity/associated sxs/prior Treatment) HPI Comments: This is a 53 year old female who presents to the emergency department with her husband and her on complaining of worsening back pain 1 week. Patient had lumbar spine surgery on 03/10/2014 by Dr. Jeral Fruit and was discharged home 2 days later. Agent has been walking home with a walker, however is reporting increased pain with walking. Denies pain, numbness or tingling radiating down her extremities. Denies extremity weakness. Denies loss of control of bowels or bladder or saddle anesthesia. No fevers. She has been taking hydromorphone 4 mg along with diazepam 5 mg as directed, last dose of hydromorphone was at 8:00 AM today. She reports this medication is not helping very much. She reports she is supposed to have a follow-up appointment sometime this coming week, however has not yet scheduled this.  Patient is a 53 y.o. female presenting with back pain. The history is provided by the patient, the spouse and a relative.  Back Pain   Past Medical History  Diagnosis Date  . Rheumatoid arthritis     takes Prednisone daily  . Complication of anesthesia     pt states after surgery in 2000 had to wear a heart monitor for 3 days.  Marland Kitchen PONV (postoperative nausea and vomiting)   . Peripheral edema     takes HCTZ daily   . Hyperlipidemia     takes Fenofibrate daily  . Pneumonia 62yrs ago    hx of  . History of bronchitis 87yrs ago  . Weakness     and tingling on left side  . Joint pain   . Joint swelling   . Chronic back pain     stenosis  . GERD (gastroesophageal reflux disease)     takes OTC meds if needed  . Osteoarthritis   . DDD (degenerative disc disease)   . Kidney stone     has one right now on the  right side but not giving any problems  . History of blood transfusion     as a child  . Depression   . Chronic pain     takes Lexapro daily   Past Surgical History  Procedure Laterality Date  . Abdominal hysterectomy  2000  . Tubal ligation  24 yrs ago  . Lumbar laminectomy/decompression microdiscectomy Left 12/08/2013    Procedure: LEFT LUMBAR FOUR-FIVEW, LUMBAR FIVE-SACRAL ONE LAMINECTOMY;  Surgeon: Karn Cassis, MD;  Location: [facility];  Service: Neurosurgery;  Laterality: Left;   History reviewed. No pertinent family history. History  Substance Use Topics  . Smoking status: Never Smoker   . Smokeless tobacco: Not on file  . Alcohol Use: No   OB History    No data available     Review of Systems  Musculoskeletal: Positive for back pain.   10 Systems reviewed and are negative for acute change except as noted in the HPI.   Allergies  Doxycycline; Ibuprofen; Penicillins; Plaquenil ; and Sulfa antibiotics  Home Medications   Prior to Admission medications   Medication Sig Start Date End Date Taking? Authorizing Provider  acetaminophen (TYLENOL) 500 MG tablet Take 1,000 mg by mouth every 6 (six) hours as needed for mild pain or moderate pain.    Yes Historical Provider, MD  CALCIUM PO  Take 1 tablet by mouth 2 (two) times daily.   Yes Historical Provider, MD  diazepam (VALIUM) 5 MG tablet Take 5 mg by mouth every 6 (six) hours as needed for muscle spasms.   Yes Historical Provider, MD  fenofibrate micronized (LOFIBRA) 134 MG capsule Take 134 mg by mouth daily before breakfast.   Yes Historical Provider, MD  hydrochlorothiazide (HYDRODIURIL) 25 MG tablet Take 25 mg by mouth daily.   Yes Historical Provider, MD  HYDROmorphone (DILAUDID) 4 MG tablet Take 4 mg by mouth every 4 (four) hours as needed for moderate pain or severe pain.   Yes Historical Provider, MD  leflunomide (ARAVA) 10 MG tablet Take 10 mg by mouth daily.   Yes Historical Provider, MD  ondansetron  (ZOFRAN) 4 MG tablet Take 4 mg by mouth every 4 (four) hours as needed for nausea or vomiting.   Yes Historical Provider, MD  oxyCODONE (ROXICODONE) 15 MG immediate release tablet Take 7.5-15 mg by mouth every 4 (four) hours as needed for pain.   Yes Historical Provider, MD  predniSONE (DELTASONE) 10 MG tablet Take 10 mg by mouth daily with breakfast.   Yes Historical Provider, MD  clindamycin (CLEOCIN) 300 MG capsule Take 300 mg by mouth 3 (three) times daily. Pt states will complete treatment by Sunday 03/07/2014    Historical Provider, MD  escitalopram (LEXAPRO) 10 MG tablet Take 10 mg by mouth daily.    Historical Provider, MD  HYDROcodone-acetaminophen (NORCO) 10-325 MG per tablet Take 1 tablet by mouth every 4 (four) hours as needed for moderate pain.    Historical Provider, MD   BP 122/50 mmHg  Pulse 81  Temp(Src) 97.6 F (36.4 C) (Oral)  Resp 16  SpO2 97% Physical Exam  Constitutional: She is oriented to person, place, and time. She appears well-developed and well-nourished. No distress.  HENT:  Head: Normocephalic and atraumatic.  Mouth/Throat: Oropharynx is clear and moist.  Eyes: Conjunctivae are normal.  Neck: Normal range of motion. Neck supple. No spinous process tenderness and no muscular tenderness present.  Cardiovascular: Normal rate, regular rhythm and normal heart sounds.   Pulmonary/Chest: Effort normal and breath sounds normal. No respiratory distress.  Musculoskeletal: She exhibits no edema.       Back:  Neurological: She is alert and oriented to person, place, and time. She has normal strength.  Strength lower extremities 5/5 and equal bilateral. Sensation intact. Ambulates with assistance/walker.  Skin: Skin is warm and dry. No rash noted. She is not diaphoretic.  Psychiatric: She has a normal mood and affect. Her behavior is normal.  Nursing note and vitals reviewed.   ED Course  Procedures (including critical care time) Labs Review Labs Reviewed - No  data to display  Imaging Review No results found.   EKG Interpretation None      MDM   Final diagnoses:  Acute low back pain   Patient presenting with back pain, surgery on October 28. She is nontoxic appearing and in no apparent distress. Afebrile, vital signs stable. No focal neurologic deficits. No signs or symptoms of central cord compression or cauda equina. She is able to ambulate with assistance/walker. Pain controlled with IM Dilaudid and by mouth Vicodin. She states she is feeling much better. She has a prescription for Vicodin at home, however states she did not want to take it because it made her sleepy. I advised her to take the Vicodin along with her other pain medications if she is in severe pain. Follow-up with  Dr. Jeral Fruit this week. Stable for d/c. Return precautions given. Patient and family state understanding of treatment care plan and are agreeable.  Discussed with attending Dr. Romeo Apple who also evaluated patient and agrees with plan of care.    Kathrynn Speed, PA-C 03/21/14 1157  Purvis Sheffield, MD 03/21/14 [phone]

## 2014-03-26 ENCOUNTER — Emergency Department (HOSPITAL_COMMUNITY)
Admission: EM | Admit: 2014-03-26 | Discharge: 2014-03-26 | Disposition: A | Discharge status: Home / Self Care | Payer: BC Managed Care – PPO | Attending: Emergency Medicine | Admitting: Emergency Medicine

## 2014-03-26 ENCOUNTER — Encounter (HOSPITAL_COMMUNITY): Payer: Self-pay | Admitting: Emergency Medicine

## 2014-03-26 DIAGNOSIS — Z880 Allergy status to penicillin: Secondary | ICD-10-CM | POA: Insufficient documentation

## 2014-03-26 DIAGNOSIS — Z792 Long term (current) use of antibiotics: Secondary | ICD-10-CM | POA: Insufficient documentation

## 2014-03-26 DIAGNOSIS — G8929 Other chronic pain: Secondary | ICD-10-CM | POA: Insufficient documentation

## 2014-03-26 DIAGNOSIS — M549 Dorsalgia, unspecified: Secondary | ICD-10-CM | POA: Diagnosis present

## 2014-03-26 DIAGNOSIS — Z8719 Personal history of other diseases of the digestive system: Secondary | ICD-10-CM | POA: Insufficient documentation

## 2014-03-26 DIAGNOSIS — E785 Hyperlipidemia, unspecified: Secondary | ICD-10-CM | POA: Diagnosis not present

## 2014-03-26 DIAGNOSIS — Z8701 Personal history of pneumonia (recurrent): Secondary | ICD-10-CM | POA: Diagnosis not present

## 2014-03-26 DIAGNOSIS — Z7952 Long term (current) use of systemic steroids: Secondary | ICD-10-CM | POA: Insufficient documentation

## 2014-03-26 DIAGNOSIS — F329 Major depressive disorder, single episode, unspecified: Secondary | ICD-10-CM | POA: Diagnosis not present

## 2014-03-26 DIAGNOSIS — Z8709 Personal history of other diseases of the respiratory system: Secondary | ICD-10-CM | POA: Insufficient documentation

## 2014-03-26 DIAGNOSIS — Z87442 Personal history of urinary calculi: Secondary | ICD-10-CM | POA: Diagnosis not present

## 2014-03-26 DIAGNOSIS — M1990 Unspecified osteoarthritis, unspecified site: Secondary | ICD-10-CM | POA: Insufficient documentation

## 2014-03-26 DIAGNOSIS — M069 Rheumatoid arthritis, unspecified: Secondary | ICD-10-CM | POA: Diagnosis not present

## 2014-03-26 DIAGNOSIS — M545 Low back pain, unspecified: Secondary | ICD-10-CM

## 2014-03-26 DIAGNOSIS — Z79899 Other long term (current) drug therapy: Secondary | ICD-10-CM | POA: Diagnosis not present

## 2014-03-26 DIAGNOSIS — Z9889 Other specified postprocedural states: Secondary | ICD-10-CM | POA: Diagnosis not present

## 2014-03-26 MED ORDER — HYDROCODONE-ACETAMINOPHEN 5-325 MG PO TABS
1.0000 | ORAL_TABLET | Freq: Once | ORAL | Status: AC
  Administered 2014-03-26: 1 via ORAL
  Filled 2014-03-26: qty 1

## 2014-03-26 MED ORDER — HYDROMORPHONE HCL 1 MG/ML IJ SOLN
1.0000 mg | Freq: Once | INTRAMUSCULAR | Status: AC
  Administered 2014-03-26: 1 mg via INTRAMUSCULAR
  Filled 2014-03-26: qty 1

## 2014-03-26 MED ORDER — HYDROMORPHONE HCL 4 MG PO TABS: ORAL_TABLET | ORAL | Status: DC

## 2014-03-26 NOTE — ED Notes (Signed)
Patient reports had back surgery in October. Complaining of back pain that has been persistent. Recently seen for same by surgeon and at [facility] per patient.

## 2014-03-26 NOTE — Discharge Instructions (Signed)
Back Pain, Adult °Back pain is very common. The pain often gets better over time. The cause of back pain is usually not dangerous. Most people can learn to manage their back pain on their own.  °HOME CARE  °· Stay active. Start with short walks on flat ground if you can. Try to walk farther each day. °· Do not sit, drive, or stand in one place for more than 30 minutes. Do not stay in bed. °· Do not avoid exercise or work. Activity can help your back heal faster. °· Be careful when you bend or lift an object. Bend at your knees, keep the object close to you, and do not twist. °· Sleep on a firm mattress. Lie on your side, and bend your knees. If you lie on your back, put a pillow under your knees. °· Only take medicines as told by your doctor. °· Put ice on the injured area. °¨ Put ice in a plastic bag. °¨ Place a towel between your skin and the bag. °¨ Leave the ice on for 15-20 minutes, 03-04 times a day for the first 2 to 3 days. After that, you can switch between ice and heat packs. °· Ask your doctor about back exercises or massage. °· Avoid feeling anxious or stressed. Find good ways to deal with stress, such as exercise. °GET HELP RIGHT AWAY IF:  °· Your pain does not go away with rest or medicine. °· Your pain does not go away in 1 week. °· You have new problems. °· You do not feel well. °· The pain spreads into your legs. °· You cannot control when you poop (bowel movement) or pee (urinate). °· Your arms or legs feel weak or lose feeling (numbness). °· You feel sick to your stomach (nauseous) or throw up (vomit). °· You have belly (abdominal) pain. °· You feel like you may pass out (faint). °MAKE SURE YOU:  °· Understand these instructions. °· Will watch your condition. °· Will get help right away if you are not doing well or get worse. °Document Released: 10/17/2007 Document Revised: 07/23/2011 Document Reviewed: 09/01/2013 °[employer]® Patient Information ©2015 [employer]. This information is not intended  to replace advice given to you by your health care provider. Make sure you discuss any questions you have with your health care provider. ° °

## 2014-03-26 NOTE — ED Notes (Signed)
Patient with no complaints at this time. Respirations even and unlabored. Skin warm/dry. Discharge instructions reviewed with patient at this time. Patient given opportunity to voice concerns/ask questions. Patient discharged at this time and left Emergency Department with steady gait.   

## 2014-03-26 NOTE — ED Notes (Signed)
Patient ambulated to [facility] w/assistance of husband.

## 2014-03-29 ENCOUNTER — Emergency Department (HOSPITAL_COMMUNITY)
Admission: EM | Admit: 2014-03-29 | Discharge: 2014-03-29 | Disposition: A | Discharge status: Home / Self Care | Payer: BC Managed Care – PPO | Attending: Emergency Medicine | Admitting: Emergency Medicine

## 2014-03-29 ENCOUNTER — Encounter (HOSPITAL_COMMUNITY): Payer: Self-pay | Admitting: *Deleted

## 2014-03-29 ENCOUNTER — Emergency Department (HOSPITAL_COMMUNITY): Payer: BC Managed Care – PPO

## 2014-03-29 DIAGNOSIS — Z792 Long term (current) use of antibiotics: Secondary | ICD-10-CM | POA: Insufficient documentation

## 2014-03-29 DIAGNOSIS — Z87442 Personal history of urinary calculi: Secondary | ICD-10-CM | POA: Diagnosis not present

## 2014-03-29 DIAGNOSIS — Z79899 Other long term (current) drug therapy: Secondary | ICD-10-CM | POA: Diagnosis not present

## 2014-03-29 DIAGNOSIS — G8929 Other chronic pain: Secondary | ICD-10-CM | POA: Insufficient documentation

## 2014-03-29 DIAGNOSIS — N390 Urinary tract infection, site not specified: Secondary | ICD-10-CM

## 2014-03-29 DIAGNOSIS — R52 Pain, unspecified: Secondary | ICD-10-CM

## 2014-03-29 DIAGNOSIS — Z880 Allergy status to penicillin: Secondary | ICD-10-CM | POA: Diagnosis not present

## 2014-03-29 DIAGNOSIS — K219 Gastro-esophageal reflux disease without esophagitis: Secondary | ICD-10-CM | POA: Insufficient documentation

## 2014-03-29 DIAGNOSIS — Z8701 Personal history of pneumonia (recurrent): Secondary | ICD-10-CM | POA: Insufficient documentation

## 2014-03-29 DIAGNOSIS — F329 Major depressive disorder, single episode, unspecified: Secondary | ICD-10-CM | POA: Insufficient documentation

## 2014-03-29 DIAGNOSIS — M549 Dorsalgia, unspecified: Secondary | ICD-10-CM | POA: Diagnosis present

## 2014-03-29 DIAGNOSIS — Z8739 Personal history of other diseases of the musculoskeletal system and connective tissue: Secondary | ICD-10-CM | POA: Insufficient documentation

## 2014-03-29 DIAGNOSIS — M1990 Unspecified osteoarthritis, unspecified site: Secondary | ICD-10-CM | POA: Insufficient documentation

## 2014-03-29 DIAGNOSIS — Z7952 Long term (current) use of systemic steroids: Secondary | ICD-10-CM | POA: Insufficient documentation

## 2014-03-29 LAB — URINE MICROSCOPIC-ADD ON

## 2014-03-29 LAB — URINALYSIS, ROUTINE W REFLEX MICROSCOPIC
Bilirubin Urine: NEGATIVE
Glucose, UA: NEGATIVE mg/dL
Hgb urine dipstick: NEGATIVE
Ketones, ur: NEGATIVE mg/dL
Nitrite: POSITIVE — AB
Protein, ur: NEGATIVE mg/dL
Specific Gravity, Urine: 1.016 (ref 1.005–1.030)
Urobilinogen, UA: 0.2 mg/dL (ref 0.0–1.0)
pH: 5.5 (ref 5.0–8.0)

## 2014-03-29 MED ORDER — LIDOCAINE HCL (PF) 1 % IJ SOLN
INTRAMUSCULAR | Status: AC
  Administered 2014-03-29: 05:00:00
  Filled 2014-03-29: qty 5

## 2014-03-29 MED ORDER — HYDROMORPHONE HCL 1 MG/ML IJ SOLN
1.0000 mg | Freq: Once | INTRAMUSCULAR | Status: AC
  Administered 2014-03-29: 1 mg via INTRAMUSCULAR
  Filled 2014-03-29: qty 1

## 2014-03-29 MED ORDER — METHOCARBAMOL 500 MG PO TABS
1000.0000 mg | ORAL_TABLET | Freq: Once | ORAL | Status: AC
  Administered 2014-03-29: 1000 mg via ORAL
  Filled 2014-03-29: qty 2

## 2014-03-29 MED ORDER — TRAMADOL HCL 50 MG PO TABS: 50.0000 mg | ORAL_TABLET | Freq: Four times a day (QID) | ORAL | Status: DC | PRN

## 2014-03-29 MED ORDER — CEFTRIAXONE SODIUM 1 G IJ SOLR
1.0000 g | Freq: Once | INTRAMUSCULAR | Status: AC
  Administered 2014-03-29: 1 g via INTRAMUSCULAR
  Filled 2014-03-29: qty 10

## 2014-03-29 MED ORDER — PHENAZOPYRIDINE HCL 200 MG PO TABS: 200.0000 mg | ORAL_TABLET | Freq: Three times a day (TID) | ORAL | Status: DC

## 2014-03-29 MED ORDER — CIPROFLOXACIN HCL 500 MG PO TABS: 500.0000 mg | ORAL_TABLET | Freq: Two times a day (BID) | ORAL | Status: DC

## 2014-03-29 NOTE — ED Notes (Signed)
Patient presents with c/o back pain.  Had surgery done on 7/28 and again on 10/28 by Dr Claudette Head.  States her feet hurt so bad she does not know what to do.  Has taken all the pain med given to her on 11/14 at [facility] ( hydromorphine)  She is to call Dr Claudette Head in the AM.  Patient states she does not ever want to have another surgery

## 2014-03-29 NOTE — ED Notes (Signed)
EDP made aware of pt's request for pain medicine for her back pain.

## 2014-03-29 NOTE — ED Provider Notes (Addendum)
CSN: [id number]     Arrival date & time 03/29/14  0051 History   First MD Initiated Contact with Patient 03/29/14 0237     Chief Complaint  Patient presents with  . Back Pain     (Consider location/radiation/quality/duration/timing/severity/associated sxs/prior Treatment) Patient is a 53 y.o. female presenting with back pain. The history is provided by the patient and the spouse.  Back Pain Location:  Sacro-iliac joint Quality:  Aching Radiates to:  Does not radiate Pain severity:  Severe Pain is:  Same all the time Onset quality:  Gradual Timing:  Constant Progression:  Unchanged Chronicity:  Chronic Context: not emotional stress and not physical stress   Relieved by:  Nothing Worsened by:  Nothing tried Ineffective treatments:  Narcotics Associated symptoms: no bladder incontinence, no bowel incontinence, no chest pain, no dysuria, no fever, no pelvic pain, no perianal numbness and no weakness     Past Medical History  Diagnosis Date  . Rheumatoid arthritis     takes Prednisone daily  . Complication of anesthesia     pt states after surgery in 2000 had to wear a heart monitor for 3 days.  Marland Kitchen PONV (postoperative nausea and vomiting)   . Peripheral edema     takes HCTZ daily   . Hyperlipidemia     takes Fenofibrate daily  . Pneumonia 18yrs ago    hx of  . History of bronchitis 74yrs ago  . Weakness     and tingling on left side  . Joint pain   . Joint swelling   . Chronic back pain     stenosis  . GERD (gastroesophageal reflux disease)     takes OTC meds if needed  . Osteoarthritis   . DDD (degenerative disc disease)   . Kidney stone     has one right now on the right side but not giving any problems  . History of blood transfusion     as a child  . Depression   . Chronic pain     takes Lexapro daily   Past Surgical History  Procedure Laterality Date  . Abdominal hysterectomy  2000  . Tubal ligation  24 yrs ago  . Lumbar laminectomy/decompression  microdiscectomy Left 12/08/2013    Procedure: LEFT LUMBAR FOUR-FIVEW, LUMBAR FIVE-SACRAL ONE LAMINECTOMY;  Surgeon: Karn Cassis, MD;  Location: [facility];  Service: Neurosurgery;  Laterality: Left;   History reviewed. No pertinent family history. History  Substance Use Topics  . Smoking status: Never Smoker   . Smokeless tobacco: Not on file  . Alcohol Use: No   OB History    No data available     Review of Systems  Constitutional: Negative for fever.  Cardiovascular: Negative for chest pain.  Gastrointestinal: Negative for bowel incontinence.  Genitourinary: Negative for bladder incontinence, dysuria and pelvic pain.  Musculoskeletal: Positive for back pain.  Neurological: Negative for weakness.  All other systems reviewed and are negative.     Allergies  Doxycycline; Ibuprofen; Penicillins; Plaquenil ; and Sulfa antibiotics  Home Medications   Prior to Admission medications   Medication Sig Start Date End Date Taking? Authorizing Provider  acetaminophen (TYLENOL) 500 MG tablet Take 1,000 mg by mouth every 6 (six) hours as needed for mild pain or moderate pain.    Yes Historical Provider, MD  CALCIUM PO Take 1 tablet by mouth 2 (two) times daily.   Yes Historical Provider, MD  diazepam (VALIUM) 5 MG tablet Take 5 mg by mouth every  6 (six) hours as needed for muscle spasms.   Yes Historical Provider, MD  gabapentin (NEURONTIN) 300 MG capsule Take 300 mg by mouth 3 (three) times daily.   Yes Historical Provider, MD  HYDROcodone-acetaminophen (NORCO) 10-325 MG per tablet Take 1 tablet by mouth every 4 (four) hours as needed for moderate pain.   Yes Historical Provider, MD  HYDROmorphone (DILAUDID) 4 MG tablet Take one tab po q 4-6 hrs prn pain 03/26/14  Yes Tammy L. Triplett, PA-C  ondansetron (ZOFRAN) 4 MG tablet Take 4 mg by mouth every 4 (four) hours as needed for nausea or vomiting.   Yes Historical Provider, MD  predniSONE (DELTASONE) 10 MG tablet Take 10 mg by mouth  daily with breakfast.   Yes Historical Provider, MD  clindamycin (CLEOCIN) 300 MG capsule Take 300 mg by mouth 3 (three) times daily. Pt states will complete treatment by Sunday 03/07/2014    Historical Provider, MD  escitalopram (LEXAPRO) 10 MG tablet Take 10 mg by mouth daily.    Historical Provider, MD  fenofibrate micronized (LOFIBRA) 134 MG capsule Take 134 mg by mouth daily before breakfast.    Historical Provider, MD  hydrochlorothiazide (HYDRODIURIL) 25 MG tablet Take 25 mg by mouth daily.    Historical Provider, MD  leflunomide (ARAVA) 10 MG tablet Take 10 mg by mouth daily.    Historical Provider, MD  oxyCODONE (ROXICODONE) 15 MG immediate release tablet Take 7.5-15 mg by mouth every 4 (four) hours as needed for pain.    Historical Provider, MD   BP 124/93 mmHg  Pulse 83  Temp(Src) 98.1 F (36.7 C) (Oral)  Resp 16  Ht 5\' 7"  (1.702 m)  Wt 175 lb (79.379 kg)  BMI 27.40 kg/m2  SpO2 99% Physical Exam  Constitutional: She is oriented to person, place, and time. She appears well-developed and well-nourished. No distress.  HENT:  Head: Normocephalic and atraumatic.  Mouth/Throat: Oropharynx is clear and moist.  Eyes: Conjunctivae are normal. Pupils are equal, round, and reactive to light.  Neck: Normal range of motion. Neck supple.  Cardiovascular: Normal rate, regular rhythm and intact distal pulses.   Pulmonary/Chest: Effort normal and breath sounds normal. She has no wheezes. She has no rales.  Abdominal: Soft. Bowel sounds are normal. There is no tenderness. There is no rebound and no guarding.  Musculoskeletal: Normal range of motion.  Neurological: She is alert and oriented to person, place, and time.  Skin: Skin is warm and dry.  Psychiatric: She has a normal mood and affect.    ED Course  Procedures (including critical care time) Labs Review Labs Reviewed  URINALYSIS, ROUTINE W REFLEX MICROSCOPIC    Imaging Review No results found.   EKG Interpretation None       MDM   Final diagnoses:  Pain    255 case d/w Dr. CT L spine and call in am to be seen today   Patient is neurologically intact.  No f/c/r.  No weakness no bowel or bladder symptoms.  Will treat for UTI with antibiotics and pyridium.  Will need to be seen by neurosurgery today for ongoing management and care    Jason Frisbee K Derita Michelsen-Rasch, MD 03/29/14 0440  Caty Tessler K Jewelianna Pancoast-Rasch, MD 03/30/14 04/01/14

## 2014-03-30 NOTE — ED Provider Notes (Signed)
CSN: [id number]     Arrival date & time 03/26/14  1851 History   First MD Initiated Contact with Patient 03/26/14 1916     Chief Complaint  Patient presents with  . Back Pain     (Consider location/radiation/quality/duration/timing/severity/associated sxs/prior Treatment) HPI   Kaitlin Fleming is a 53 y.o. female who presents to the Emergency Department complaining of persistent low back pain since having lumbar surgery on 03/10/14.  She states the pain is sharp and along her incison site.  She also reports having tingling into both her legs.  Her husband states she was seen a few days ago at [facility] and given an injection of pain medication and a vicodin which greatly helped her pain.  He also states that she was taking dilaudid tablets for pain, but has ran out.  She is currently taking diazepam and oxycodone for her pain which is not controlling it adequately.  She denies fever, dysuria, swelling, chills, abdominal pain, incontinence or retention of bladder or bowel or saddle anesthesia's.   Past Medical History  Diagnosis Date  . Rheumatoid arthritis     takes Prednisone daily  . Complication of anesthesia     pt states after surgery in 2000 had to wear a heart monitor for 3 days.  Marland Kitchen PONV (postoperative nausea and vomiting)   . Peripheral edema     takes HCTZ daily   . Hyperlipidemia     takes Fenofibrate daily  . Pneumonia 3yrs ago    hx of  . History of bronchitis 20yrs ago  . Weakness     and tingling on left side  . Joint pain   . Joint swelling   . Chronic back pain     stenosis  . GERD (gastroesophageal reflux disease)     takes OTC meds if needed  . Osteoarthritis   . DDD (degenerative disc disease)   . Kidney stone     has one right now on the right side but not giving any problems  . History of blood transfusion     as a child  . Depression   . Chronic pain     takes Lexapro daily   Past Surgical History  Procedure Laterality Date  . Abdominal hysterectomy   2000  . Tubal ligation  24 yrs ago  . Lumbar laminectomy/decompression microdiscectomy Left 12/08/2013    Procedure: LEFT LUMBAR FOUR-FIVEW, LUMBAR FIVE-SACRAL ONE LAMINECTOMY;  Surgeon: Karn Cassis, MD;  Location: [facility];  Service: Neurosurgery;  Laterality: Left;   History reviewed. No pertinent family history. History  Substance Use Topics  . Smoking status: Never Smoker   . Smokeless tobacco: Not on file  . Alcohol Use: No   OB History    No data available     Review of Systems  Constitutional: Negative for fever.  Respiratory: Negative for shortness of breath.   Gastrointestinal: Negative for vomiting, abdominal pain and constipation.  Genitourinary: Negative for dysuria, hematuria, flank pain, decreased urine volume and difficulty urinating.       Low back pain  Musculoskeletal: Positive for back pain. Negative for joint swelling.  Skin: Negative for rash.  Neurological: Negative for weakness and numbness.  All other systems reviewed and are negative.     Allergies  Doxycycline; Ibuprofen; Penicillins; Plaquenil ; and Sulfa antibiotics  Home Medications   Prior to Admission medications   Medication Sig Start Date End Date Taking? Authorizing Provider  acetaminophen (TYLENOL) 500 MG tablet Take 1,000 mg by  mouth every 6 (six) hours as needed for mild pain or moderate pain.     Historical Provider, MD  CALCIUM PO Take 1 tablet by mouth 2 (two) times daily.    Historical Provider, MD  ciprofloxacin (CIPRO) 500 MG tablet Take 1 tablet (500 mg total) by mouth 2 (two) times daily. One po bid x 7 days 03/29/14   April K Palumbo-Rasch, MD  clindamycin (CLEOCIN) 300 MG capsule Take 300 mg by mouth 3 (three) times daily. Pt states will complete treatment by Sunday 03/07/2014    Historical Provider, MD  diazepam (VALIUM) 5 MG tablet Take 5 mg by mouth every 6 (six) hours as needed for muscle spasms.    Historical Provider, MD  escitalopram (LEXAPRO) 10 MG tablet Take  10 mg by mouth daily.    Historical Provider, MD  fenofibrate micronized (LOFIBRA) 134 MG capsule Take 134 mg by mouth daily before breakfast.    Historical Provider, MD  gabapentin (NEURONTIN) 300 MG capsule Take 300 mg by mouth 3 (three) times daily.    Historical Provider, MD  hydrochlorothiazide (HYDRODIURIL) 25 MG tablet Take 25 mg by mouth daily.    Historical Provider, MD  HYDROcodone-acetaminophen (NORCO) 10-325 MG per tablet Take 1 tablet by mouth every 4 (four) hours as needed for moderate pain.    Historical Provider, MD  HYDROmorphone (DILAUDID) 4 MG tablet Take one tab po q 4-6 hrs prn pain 03/26/14   Khairi Garman L. Shaterria Sager, PA-C  leflunomide (ARAVA) 10 MG tablet Take 10 mg by mouth daily.    Historical Provider, MD  ondansetron (ZOFRAN) 4 MG tablet Take 4 mg by mouth every 4 (four) hours as needed for nausea or vomiting.    Historical Provider, MD  oxyCODONE (ROXICODONE) 15 MG immediate release tablet Take 7.5-15 mg by mouth every 4 (four) hours as needed for pain.    Historical Provider, MD  phenazopyridine (PYRIDIUM) 200 MG tablet Take 1 tablet (200 mg total) by mouth 3 (three) times daily. 03/29/14   April K Palumbo-Rasch, MD  predniSONE (DELTASONE) 10 MG tablet Take 10 mg by mouth daily with breakfast.    Historical Provider, MD  traMADol (ULTRAM) 50 MG tablet Take 1 tablet (50 mg total) by mouth every 6 (six) hours as needed. 03/29/14   April K Palumbo-Rasch, MD   BP 121/73 mmHg  Pulse 103  Temp(Src) 98 F (36.7 C) (Oral)  Resp 16  SpO2 97% Physical Exam  Constitutional: She is oriented to person, place, and time. She appears well-developed and well-nourished. No distress.  HENT:  Head: Normocephalic and atraumatic.  Neck: Normal range of motion. Neck supple.  Cardiovascular: Normal rate, regular rhythm, normal heart sounds and intact distal pulses.   No murmur heard. Pulmonary/Chest: Effort normal and breath sounds normal. No respiratory distress.  Abdominal: Soft. She  exhibits no distension. There is no tenderness. There is no rebound and no guarding.  Musculoskeletal: She exhibits tenderness. She exhibits no edema.       Lumbar back: She exhibits tenderness and pain. She exhibits normal range of motion, no swelling, no deformity, no laceration and normal pulse.  Midline ttp of the lumbar spine.  Incision appears to be healing well, staples intact.  No edema, drainage or erythema.  DP pulses are brisk and symmetrical.  Distal sensation intact.  Hip Flexors/Extensors are intact.  Pt has 5/5 strength against resistance of bilateral lower extremities.     Neurological: She is alert and oriented to person, place, and time. She  has normal strength. No sensory deficit. She exhibits normal muscle tone. Coordination and gait normal.  Reflex Scores:      Patellar reflexes are 2+ on the right side and 2+ on the left side.      Achilles reflexes are 2+ on the right side and 2+ on the left side. Skin: Skin is warm and dry. No rash noted.  Nursing note and vitals reviewed.   ED Course  Procedures (including critical care time) Labs Review Labs Reviewed - No data to display  Imaging Review   EKG Interpretation None      MDM   Final diagnoses:  Midline low back pain without sciatica    Previous notes reviewed.   Patient with midline low back pain and s/p lumbar surgery on 03/10/14 by Dr. Jeral Fruit.  She reports continued back pain since the surgery.  She is ambulatory no focal neuro deficits on exam and ambulated in the dept with a steady gait.  Incision appears well healed and staples are in place.  She agrees to close f/u with her surgeon on Monday.  Her husband requests a refill of her pain medication to last through the weekend.  No concerning sx's for emergent neurological or infectious process at this time.  She appears stable for d/c    Danika Kluender L. Trisha Mangle, PA-C 03/30/14 1638  Lyanne Co, MD 03/31/14 [phone]

## 2014-04-09 ENCOUNTER — Emergency Department (HOSPITAL_COMMUNITY)
Admission: EM | Admit: 2014-04-09 | Discharge: 2014-04-09 | Disposition: A | Discharge status: Home / Self Care | Payer: BC Managed Care – PPO | Attending: Emergency Medicine | Admitting: Emergency Medicine

## 2014-04-09 ENCOUNTER — Encounter (HOSPITAL_COMMUNITY): Payer: Self-pay | Admitting: *Deleted

## 2014-04-09 DIAGNOSIS — Z8709 Personal history of other diseases of the respiratory system: Secondary | ICD-10-CM | POA: Insufficient documentation

## 2014-04-09 DIAGNOSIS — M069 Rheumatoid arthritis, unspecified: Secondary | ICD-10-CM | POA: Insufficient documentation

## 2014-04-09 DIAGNOSIS — F329 Major depressive disorder, single episode, unspecified: Secondary | ICD-10-CM | POA: Insufficient documentation

## 2014-04-09 DIAGNOSIS — Z79899 Other long term (current) drug therapy: Secondary | ICD-10-CM | POA: Insufficient documentation

## 2014-04-09 DIAGNOSIS — M79671 Pain in right foot: Secondary | ICD-10-CM | POA: Diagnosis present

## 2014-04-09 DIAGNOSIS — Z8639 Personal history of other endocrine, nutritional and metabolic disease: Secondary | ICD-10-CM | POA: Insufficient documentation

## 2014-04-09 DIAGNOSIS — Z880 Allergy status to penicillin: Secondary | ICD-10-CM | POA: Diagnosis not present

## 2014-04-09 DIAGNOSIS — Z87442 Personal history of urinary calculi: Secondary | ICD-10-CM | POA: Diagnosis not present

## 2014-04-09 DIAGNOSIS — M1990 Unspecified osteoarthritis, unspecified site: Secondary | ICD-10-CM | POA: Insufficient documentation

## 2014-04-09 DIAGNOSIS — Z9889 Other specified postprocedural states: Secondary | ICD-10-CM | POA: Diagnosis not present

## 2014-04-09 DIAGNOSIS — Z7952 Long term (current) use of systemic steroids: Secondary | ICD-10-CM | POA: Diagnosis not present

## 2014-04-09 DIAGNOSIS — Z8701 Personal history of pneumonia (recurrent): Secondary | ICD-10-CM | POA: Insufficient documentation

## 2014-04-09 DIAGNOSIS — M5416 Radiculopathy, lumbar region: Secondary | ICD-10-CM | POA: Diagnosis not present

## 2014-04-09 DIAGNOSIS — Z8719 Personal history of other diseases of the digestive system: Secondary | ICD-10-CM | POA: Insufficient documentation

## 2014-04-09 DIAGNOSIS — Z792 Long term (current) use of antibiotics: Secondary | ICD-10-CM | POA: Diagnosis not present

## 2014-04-09 DIAGNOSIS — M5410 Radiculopathy, site unspecified: Secondary | ICD-10-CM

## 2014-04-09 NOTE — ED Provider Notes (Signed)
CSN: [id number]     Arrival date & time 04/09/14  1608 History   First MD Initiated Contact with Patient 04/09/14 1634     Chief Complaint  Patient presents with  . Foot Pain     (Consider location/radiation/quality/duration/timing/severity/associated sxs/prior Treatment) HPI Comments: Patient is a 53 year old female who presents to the emergency department with complaint of " pain and funny feeling of my left foot". Patient states that 3 weeks ago she had her second surgery of her lower back. She states that this surgery was a major surgery because she had a lot of work done including cages put in place. She states she's been having some discomfort of her foot since that time. But in the last 4 days she's been having is more of a sensation that there is "Saran wrap" around her foot. She sometimes times has a sensation that the foot is hot, or cold, and other time she has a sensation that there is something crawling on the foot or having pain. She has been seen at multiple emergency room concerning this problem. She states that she was seen by her neurosurgeon approximately 2 weeks ago, but did not discuss it with him at that time. There's been no loss of function or control of the foot or the left lower extremity. There's been no new back pain. There's been no high fever reported or rash. The patient is on several medications to help with this problem, but she states they help for little while and then they stop. She states the pain is worst at night that it is any other time.  Patient is a 53 y.o. female presenting with lower extremity pain. The history is provided by the patient and the spouse.  Foot Pain This is a chronic problem. Associated symptoms include arthralgias. Pertinent negatives include no abdominal pain, chest pain, coughing or neck pain.    Past Medical History  Diagnosis Date  . Rheumatoid arthritis     takes Prednisone daily  . Complication of anesthesia     pt states  after surgery in 2000 had to wear a heart monitor for 3 days.  Marland Kitchen PONV (postoperative nausea and vomiting)   . Peripheral edema     takes HCTZ daily   . Hyperlipidemia     takes Fenofibrate daily  . Pneumonia 23yrs ago    hx of  . History of bronchitis 69yrs ago  . Weakness     and tingling on left side  . Joint pain   . Joint swelling   . Chronic back pain     stenosis  . GERD (gastroesophageal reflux disease)     takes OTC meds if needed  . Osteoarthritis   . DDD (degenerative disc disease)   . Kidney stone     has one right now on the right side but not giving any problems  . History of blood transfusion     as a child  . Depression   . Chronic pain     takes Lexapro daily   Past Surgical History  Procedure Laterality Date  . Abdominal hysterectomy  2000  . Tubal ligation  24 yrs ago  . Lumbar laminectomy/decompression microdiscectomy Left 12/08/2013    Procedure: LEFT LUMBAR FOUR-FIVEW, LUMBAR FIVE-SACRAL ONE LAMINECTOMY;  Surgeon: Karn Cassis, MD;  Location: [facility];  Service: Neurosurgery;  Laterality: Left;   History reviewed. No pertinent family history. History  Substance Use Topics  . Smoking status: Never Smoker   .  Smokeless tobacco: Not on file  . Alcohol Use: No   OB History    No data available     Review of Systems  Constitutional: Negative for activity change.       All ROS Neg except as noted in HPI  Eyes: Negative for photophobia and discharge.  Respiratory: Negative for cough, shortness of breath and wheezing.   Cardiovascular: Negative for chest pain and palpitations.  Gastrointestinal: Negative for abdominal pain and blood in stool.  Genitourinary: Negative for dysuria, frequency and hematuria.  Musculoskeletal: Positive for back pain and arthralgias. Negative for neck pain.  Skin: Negative.   Neurological: Negative for dizziness, seizures and speech difficulty.  Psychiatric/Behavioral: Negative for hallucinations and confusion.        Depression      Allergies  Doxycycline; Ibuprofen; Penicillins; Plaquenil ; and Sulfa antibiotics  Home Medications   Prior to Admission medications   Medication Sig Start Date End Date Taking? Authorizing Provider  acetaminophen (TYLENOL) 500 MG tablet Take 1,000 mg by mouth every 6 (six) hours as needed for mild pain or moderate pain.    Yes Historical Provider, MD  amitriptyline (ELAVIL) 50 MG tablet Take 50 mg by mouth at bedtime as needed for sleep.   Yes Historical Provider, MD  CALCIUM PO Take 1 tablet by mouth 2 (two) times daily.   Yes Historical Provider, MD  cyclobenzaprine (FLEXERIL) 10 MG tablet Take 10 mg by mouth 3 (three) times daily as needed for muscle spasms.   Yes Historical Provider, MD  gabapentin (NEURONTIN) 300 MG capsule Take 300 mg by mouth 3 (three) times daily.   Yes Historical Provider, MD  HYDROmorphone (DILAUDID) 4 MG tablet Take one tab po q 4-6 hrs prn pain Patient taking differently: Take 4 mg by mouth every 4 (four) hours as needed for moderate pain.  03/26/14  Yes Tammy L. Triplett, PA-C  leflunomide (ARAVA) 10 MG tablet Take 5 mg by mouth daily as needed (Pain).    Yes Historical Provider, MD  predniSONE (DELTASONE) 20 MG tablet Take 10 mg by mouth daily with breakfast.   Yes Historical Provider, MD  ciprofloxacin (CIPRO) 500 MG tablet Take 1 tablet (500 mg total) by mouth 2 (two) times daily. One po bid x 7 days Patient not taking: Reported on 04/09/2014 03/29/14   April K Palumbo-Rasch, MD  diazepam (VALIUM) 5 MG tablet Take 5 mg by mouth every 6 (six) hours as needed for muscle spasms.    Historical Provider, MD  phenazopyridine (PYRIDIUM) 200 MG tablet Take 1 tablet (200 mg total) by mouth 3 (three) times daily. Patient not taking: Reported on 04/09/2014 03/29/14   April K Palumbo-Rasch, MD  traMADol (ULTRAM) 50 MG tablet Take 1 tablet (50 mg total) by mouth every 6 (six) hours as needed. Patient not taking: Reported on 04/09/2014 03/29/14    April K Palumbo-Rasch, MD   BP 154/78 mmHg  Pulse 121  Temp(Src) 97.9 F (36.6 C) (Oral)  Resp 20  Ht 5\' 7"  (1.702 m)  Wt 178 lb (80.74 kg)  BMI 27.87 kg/m2  SpO2 100% Physical Exam  Constitutional: She is oriented to person, place, and time. She appears well-developed and well-nourished.  Non-toxic appearance.  HENT:  Head: Normocephalic.  Right Ear: Tympanic membrane and external ear normal.  Left Ear: Tympanic membrane and external ear normal.  Eyes: EOM and lids are normal. Pupils are equal, round, and reactive to light.  Neck: Normal range of motion. Neck supple. Carotid bruit is  not present.  Cardiovascular: Normal rate, regular rhythm, normal heart sounds, intact distal pulses and normal pulses.   Pulmonary/Chest: Breath sounds normal. No respiratory distress.  Abdominal: Soft. Bowel sounds are normal. There is no tenderness. There is no guarding.  Musculoskeletal: Normal range of motion.       Left foot: Normal. There is normal range of motion, no tenderness, no bony tenderness, no swelling, normal capillary refill and no laceration.  There is no increased redness or swelling noted of the lower extremity. There is no change in temperature or size of the left compared to the right lower extremity. The Achilles tendon is intact. The dorsalis pedis and posterior tibial pulses are 2+ bilaterally.  Lymphadenopathy:       Head (right side): No submandibular adenopathy present.       Head (left side): No submandibular adenopathy present.    She has no cervical adenopathy.  Neurological: She is alert and oriented to person, place, and time. She has normal strength. No cranial nerve deficit or sensory deficit. Coordination and gait normal.  Skin: Skin is warm and dry.  Psychiatric: She has a normal mood and affect. Her speech is normal.  Nursing note and vitals reviewed.   ED Course  Procedures (including critical care time) Labs Review Labs Reviewed - No data to  display  Imaging Review No results found.   EKG Interpretation None      MDM  Examination is consistent with radiculopathy. I discussed with the patient the findings on today's exam. I discussed with the patient the need to discuss these radicular symptoms with Dr. Jeral Fruit. I've asked patient to continue her current medications at this time.    Final diagnoses:  Radiculopathy with lower extremity symptoms    *I have reviewed nursing notes, vital signs, and all appropriate lab and imaging results for this patient.**    Kathie Dike, PA-C 04/09/14 1749  Layla Maw Ward, DO 04/09/14 2045

## 2014-04-09 NOTE — ED Notes (Signed)
Patient with no complaints at this time. Respirations even and unlabored. Skin warm/dry. Discharge instructions reviewed with patient at this time. Patient given opportunity to voice concerns/ask questions. Patient discharged at this time and left Emergency Department with steady gait.   

## 2014-04-09 NOTE — Discharge Instructions (Signed)
Please speak with Dr Jeral Fruit concerning your symptoms of the left foot. No vascular compromise noted on today's exam. Please continue your current medications. Radicular Pain Radicular pain in either the arm or leg is usually from a bulging or herniated disk in the spine. A piece of the herniated disk may press against the nerves as the nerves exit the spine. This causes pain which is felt at the tips of the nerves down the arm or leg. Other causes of radicular pain may include:  Fractures.  Heart disease.  Cancer.  An abnormal and usually degenerative state of the nervous system or nerves (neuropathy). Diagnosis may require CT or MRI scanning to determine the primary cause.  Nerves that start at the neck (nerve roots) may cause radicular pain in the outer shoulder and arm. It can spread down to the thumb and fingers. The symptoms vary depending on which nerve root has been affected. In most cases radicular pain improves with conservative treatment. Neck problems may require physical therapy, a neck collar, or cervical traction. Treatment may take many weeks, and surgery may be considered if the symptoms do not improve.  Conservative treatment is also recommended for sciatica. Sciatica causes pain to radiate from the lower back or buttock area down the leg into the foot. Often there is a history of back problems. Most patients with sciatica are better after 2 to 4 weeks of rest and other supportive care. Short term bed rest can reduce the disk pressure considerably. Sitting, however, is not a good position since this increases the pressure on the disk. You should avoid bending, lifting, and all other activities which make the problem worse. Traction can be used in severe cases. Surgery is usually reserved for patients who do not improve within the first months of treatment. Only take over-the-counter or prescription medicines for pain, discomfort, or fever as directed by your caregiver. Narcotics and  muscle relaxants may help by relieving more severe pain and spasm and by providing mild sedation. Cold or massage can give significant relief. Spinal manipulation is not recommended. It can increase the degree of disc protrusion. Epidural steroid injections are often effective treatment for radicular pain. These injections deliver medicine to the spinal nerve in the space between the protective covering of the spinal cord and back bones (vertebrae). Your caregiver can give you more information about steroid injections. These injections are most effective when given within two weeks of the onset of pain.  You should see your caregiver for follow up care as recommended. A program for neck and back injury rehabilitation with stretching and strengthening exercises is an important part of management.  SEEK IMMEDIATE MEDICAL CARE IF:  You develop increased pain, weakness, or numbness in your arm or leg.  You develop difficulty with bladder or bowel control.  You develop abdominal pain. Document Released: 06/07/2004 Document Revised: 07/23/2011 Document Reviewed: 08/23/2008 [facility] Patient Information 2015 [place], [place]. This information is not intended to replace advice given to you by your health care provider. Make sure you discuss any questions you have with your health care provider.

## 2014-04-09 NOTE — ED Notes (Addendum)
Pt states she had back surgery 3 wks ago and her left foot feels like she has saran wrap around her foot making it tight.

## 2014-05-03 ENCOUNTER — Encounter (HOSPITAL_COMMUNITY): Payer: Self-pay | Admitting: Emergency Medicine

## 2014-05-03 ENCOUNTER — Emergency Department (HOSPITAL_COMMUNITY)
Admission: EM | Admit: 2014-05-03 | Discharge: 2014-05-03 | Disposition: A | Discharge status: Home / Self Care | Payer: BC Managed Care – PPO | Attending: Emergency Medicine | Admitting: Emergency Medicine

## 2014-05-03 DIAGNOSIS — G8929 Other chronic pain: Secondary | ICD-10-CM | POA: Diagnosis not present

## 2014-05-03 DIAGNOSIS — Z9889 Other specified postprocedural states: Secondary | ICD-10-CM | POA: Insufficient documentation

## 2014-05-03 DIAGNOSIS — Z880 Allergy status to penicillin: Secondary | ICD-10-CM | POA: Diagnosis not present

## 2014-05-03 DIAGNOSIS — Z8701 Personal history of pneumonia (recurrent): Secondary | ICD-10-CM | POA: Diagnosis not present

## 2014-05-03 DIAGNOSIS — Z87442 Personal history of urinary calculi: Secondary | ICD-10-CM | POA: Insufficient documentation

## 2014-05-03 DIAGNOSIS — E785 Hyperlipidemia, unspecified: Secondary | ICD-10-CM | POA: Insufficient documentation

## 2014-05-03 DIAGNOSIS — M069 Rheumatoid arthritis, unspecified: Secondary | ICD-10-CM | POA: Insufficient documentation

## 2014-05-03 DIAGNOSIS — Z8709 Personal history of other diseases of the respiratory system: Secondary | ICD-10-CM | POA: Diagnosis not present

## 2014-05-03 DIAGNOSIS — M7989 Other specified soft tissue disorders: Secondary | ICD-10-CM

## 2014-05-03 DIAGNOSIS — E876 Hypokalemia: Secondary | ICD-10-CM

## 2014-05-03 DIAGNOSIS — M79605 Pain in left leg: Secondary | ICD-10-CM | POA: Diagnosis not present

## 2014-05-03 DIAGNOSIS — Z7952 Long term (current) use of systemic steroids: Secondary | ICD-10-CM | POA: Insufficient documentation

## 2014-05-03 DIAGNOSIS — F329 Major depressive disorder, single episode, unspecified: Secondary | ICD-10-CM | POA: Diagnosis not present

## 2014-05-03 DIAGNOSIS — Z8719 Personal history of other diseases of the digestive system: Secondary | ICD-10-CM | POA: Diagnosis not present

## 2014-05-03 DIAGNOSIS — R2242 Localized swelling, mass and lump, left lower limb: Secondary | ICD-10-CM | POA: Diagnosis present

## 2014-05-03 DIAGNOSIS — Z79899 Other long term (current) drug therapy: Secondary | ICD-10-CM | POA: Insufficient documentation

## 2014-05-03 LAB — BASIC METABOLIC PANEL
Anion gap: 14 (ref 5–15)
BUN: 6 mg/dL (ref 6–23)
CO2: 32 mEq/L (ref 19–32)
Calcium: 9 mg/dL (ref 8.4–10.5)
Chloride: 94 mEq/L — ABNORMAL LOW (ref 96–112)
Creatinine, Ser: 0.58 mg/dL (ref 0.50–1.10)
GFR calc Af Amer: >90 mL/min (ref >90)
GFR calc non Af Amer: >90 mL/min (ref >90)
Glucose, Bld: 120 mg/dL — ABNORMAL HIGH (ref 70–99)
Potassium: 2.8 mEq/L — CL (ref 3.7–5.3)
Sodium: 140 mEq/L (ref 137–147)

## 2014-05-03 LAB — CBC WITH DIFFERENTIAL/PLATELET
Basophils Absolute: 0 10*3/uL (ref 0.0–0.1)
Basophils Relative: 0 % (ref 0–1)
Eosinophils Absolute: 0.3 10*3/uL (ref 0.0–0.7)
Eosinophils Relative: 3 % (ref 0–5)
HCT: 39.4 % (ref 36.0–46.0)
Hemoglobin: 12.2 g/dL (ref 12.0–15.0)
Lymphocytes Relative: 18 % (ref 12–46)
Lymphs Abs: 1.4 10*3/uL (ref 0.7–4.0)
MCH: 26.1 pg (ref 26.0–34.0)
MCHC: 31 g/dL (ref 30.0–36.0)
MCV: 84.2 fL (ref 78.0–100.0)
Monocytes Absolute: 0.8 10*3/uL (ref 0.1–1.0)
Monocytes Relative: 10 % (ref 3–12)
Neutro Abs: 5.4 10*3/uL (ref 1.7–7.7)
Neutrophils Relative %: 69 % (ref 43–77)
Platelets: 247 10*3/uL (ref 150–400)
RBC: 4.68 MIL/uL (ref 3.87–5.11)
RDW: 15 % (ref 11.5–15.5)
WBC: 7.9 10*3/uL (ref 4.0–10.5)

## 2014-05-03 LAB — D-DIMER, QUANTITATIVE: D-Dimer, Quant: 1.62 ug/mL-FEU — ABNORMAL HIGH (ref 0.00–0.48)

## 2014-05-03 MED ORDER — POTASSIUM CHLORIDE CRYS ER 20 MEQ PO TBCR
60.0000 meq | EXTENDED_RELEASE_TABLET | Freq: Once | ORAL | Status: AC
  Administered 2014-05-03: 60 meq via ORAL
  Filled 2014-05-03: qty 3

## 2014-05-03 MED ORDER — POTASSIUM CHLORIDE CRYS ER 20 MEQ PO TBCR: 20.0000 meq | EXTENDED_RELEASE_TABLET | Freq: Every day | ORAL | Status: DC

## 2014-05-03 MED ORDER — ENOXAPARIN SODIUM 80 MG/0.8ML ~~LOC~~ SOLN
1.0000 mg/kg | Freq: Once | SUBCUTANEOUS | Status: AC
  Administered 2014-05-03: 80 mg via SUBCUTANEOUS
  Filled 2014-05-03: qty 0.8

## 2014-05-03 MED ORDER — HYDROCODONE-ACETAMINOPHEN 5-325 MG PO TABS
2.0000 | ORAL_TABLET | Freq: Once | ORAL | Status: AC
  Administered 2014-05-03: 2 via ORAL
  Filled 2014-05-03: qty 2

## 2014-05-03 MED ORDER — HYDROCODONE-ACETAMINOPHEN 5-325 MG PO TABS: 1.0000 | ORAL_TABLET | ORAL | Status: DC | PRN

## 2014-05-03 NOTE — ED Notes (Signed)
Pt reports LLE pain since last Thursday. Pt reports pain more intense with ambulation. LLE mildly swollen. Pt reports tenderness to touch of LLE calf. Pt reports had back surgery in october 2015. Pt reports called pcp and was referred here.

## 2014-05-03 NOTE — Discharge Instructions (Signed)
Come for scheduled ultrasound tomorrow morning. Return immediately for chest pain, shortness of breath, passing out or other concerns. For severe pain take norco or vicodin however realize they have the potential for addiction and it can make you sleepy and has tylenol in it.  No operating machinery while taking. If you were given medicines take as directed.  If you are on coumadin or contraceptives realize their levels and effectiveness is altered by many different medicines.  If you have any reaction (rash, tongues swelling, other) to the medicines stop taking and see a physician.   Please follow up as directed and return to the ER or see a physician for new or worsening symptoms.  Thank you.

## 2014-05-03 NOTE — ED Provider Notes (Signed)
CSN: [id number]     Arrival date & time 05/03/14  1634 History   First MD Initiated Contact with Patient 05/03/14 1725     This chart was scribed for Enid Skeens, MD by Arlan Organ, ED Scribe. This patient was seen in room APA09/APA09 and the patient's care was started 8:52 PM.   Chief Complaint  Patient presents with  . Claudication   The history is provided by the patient. No language interpreter was used.    HPI Comments: Kaitlin Fleming is a 53 y.o. female with a PMHx of RA, hyperlipidemia, GERD, osteoarthrosis, and DDD  who presents to the Emergency Department complaining of constant LLE pain x 4 days that is unchanged. Pt states she feels as though the pain is radiating up her legs. Pain is exacerbated with ambulation as she states the nerves are still growing back since recent surgery. No recent CP, SOB, neck pain, back pain. Kaitlin Fleming had an artifical disc and 4 screws placed in her spine 03/10/14; surgery performed by Dr. Fransico Meadow of [facility] in [place].   Past Medical History  Diagnosis Date  . Rheumatoid arthritis     takes Prednisone daily  . Complication of anesthesia     pt states after surgery in 2000 had to wear a heart monitor for 3 days.  Marland Kitchen PONV (postoperative nausea and vomiting)   . Peripheral edema     takes HCTZ daily   . Hyperlipidemia     takes Fenofibrate daily  . Pneumonia 34yrs ago    hx of  . History of bronchitis 61yrs ago  . Weakness     and tingling on left side  . Joint pain   . Joint swelling   . Chronic back pain     stenosis  . GERD (gastroesophageal reflux disease)     takes OTC meds if needed  . Osteoarthritis   . DDD (degenerative disc disease)   . Kidney stone     has one right now on the right side but not giving any problems  . History of blood transfusion     as a child  . Depression   . Chronic pain     takes Lexapro daily   Past Surgical History  Procedure Laterality Date  . Abdominal  hysterectomy  2000  . Tubal ligation  24 yrs ago  . Lumbar laminectomy/decompression microdiscectomy Left 12/08/2013    Procedure: LEFT LUMBAR FOUR-FIVEW, LUMBAR FIVE-SACRAL ONE LAMINECTOMY;  Surgeon: Karn Cassis, MD;  Location: [facility];  Service: Neurosurgery;  Laterality: Left;   History reviewed. No pertinent family history. History  Substance Use Topics  . Smoking status: Never Smoker   . Smokeless tobacco: Not on file  . Alcohol Use: No   OB History    No data available     Review of Systems  Constitutional: Negative for fever and chills.  Respiratory: Negative for shortness of breath.   Cardiovascular: Positive for leg swelling. Negative for chest pain.  Musculoskeletal: Positive for arthralgias. Negative for back pain and neck pain.  Neurological: Negative for weakness and numbness.  All other systems reviewed and are negative.     Allergies  Doxycycline; Ibuprofen; Penicillins; Plaquenil ; and Sulfa antibiotics  Home Medications   Prior to Admission medications   Medication Sig Start Date End Date Taking? Authorizing Provider  amitriptyline (ELAVIL) 50 MG tablet Take 100 mg by mouth at bedtime.    Yes Historical Provider, MD  gabapentin (  NEURONTIN) 300 MG capsule Take 300 mg by mouth 2 (two) times daily.    Yes Historical Provider, MD  oxyCODONE-acetaminophen (PERCOCET) 10-325 MG per tablet Take 1 tablet by mouth every 4 (four) hours as needed for pain.   Yes Historical Provider, MD  azithromycin (ZITHROMAX) 250 MG tablet Take 250-500 mg by mouth See admin instructions. 2 tablets on day 1, then take 1 tablet on days 2 through 5 04/26/14   Historical Provider, MD  ciprofloxacin (CIPRO) 500 MG tablet Take 1 tablet (500 mg total) by mouth 2 (two) times daily. One po bid x 7 days Patient not taking: Reported on 04/09/2014 03/29/14   April K Palumbo-Rasch, MD  HYDROcodone-acetaminophen [facility]) 5-325 MG per tablet Take 1-2 tablets by mouth every 4 (four) hours as  needed. 05/03/14   Enid Skeens, MD  HYDROmorphone (DILAUDID) 4 MG tablet Take one tab po q 4-6 hrs prn pain Patient not taking: Reported on 05/03/2014 03/26/14   Tammy L. Triplett, PA-C  phenazopyridine (PYRIDIUM) 200 MG tablet Take 1 tablet (200 mg total) by mouth 3 (three) times daily. Patient not taking: Reported on 04/09/2014 03/29/14   April K Palumbo-Rasch, MD  traMADol (ULTRAM) 50 MG tablet Take 1 tablet (50 mg total) by mouth every 6 (six) hours as needed. Patient not taking: Reported on 04/09/2014 03/29/14   April K Palumbo-Rasch, MD   Triage Vitals: BP 137/89 mmHg  Pulse 115  Temp(Src) 98.2 F (36.8 C) (Oral)  Resp 20  Ht 5\' 7"  (1.702 m)  Wt 175 lb (79.379 kg)  BMI 27.40 kg/m2  SpO2 98%   Physical Exam  Constitutional: She is oriented to person, place, and time. She appears well-developed and well-nourished. No distress.  HENT:  Head: Normocephalic and atraumatic.  Eyes: EOM are normal.  Neck: Normal range of motion.  Cardiovascular: Normal rate, regular rhythm and normal heart sounds.   Pulses:      Dorsalis pedis pulses are 2+ on the right side, and 2+ on the left side.       Posterior tibial pulses are 2+ on the right side, and 2+ on the left side.  Pulmonary/Chest: Effort normal and breath sounds normal.  Abdominal: Soft. She exhibits no distension. There is no tenderness.  Musculoskeletal: Normal range of motion. She exhibits tenderness.  Tenderness to palpation to L calf and popliteal region Mild swelling to legs bilaterally Patient has mild tenderness paraspinal lumbar, surgical incision clear, no erythema warmth or drainage.  Neurological: She is alert and oriented to person, place, and time.  Major nerves in LLE  Skin: Skin is warm and dry.  Psychiatric: She has a normal mood and affect. Judgment normal.  Nursing note and vitals reviewed.   ED Course  Procedures (including critical care time) Emergency Ultrasound Study:  Limited Duplex of left lower  extremity veins  Performed by Dr. Indication: leg pain and/or swelling left Visualization of saphenous-femoral junction, proximal femoral vein and popliteal vein regions in transverse plane with full compression visualized.  Interpretation  no dvt visualized.  Images archived electronically.       DIAGNOSTIC STUDIES: Oxygen Saturation is 100% on RA, Normal by my interpretation.    COORDINATION OF CARE: 8:52 PM-Discussed treatment plan with pt at bedside and pt agreed to plan.     Labs Review Labs Reviewed  BASIC METABOLIC PANEL - Abnormal; Notable for the following:    Potassium 2.8 (*)    Chloride 94 (*)    Glucose, Bld 120 (*)  All other components within normal limits  D-DIMER, QUANTITATIVE - Abnormal; Notable for the following:    D-Dimer, Quant 1.62 (*)    All other components within normal limits  CBC WITH DIFFERENTIAL    Imaging Review No results found.   EKG Interpretation   Date/Time:  Monday May 03 2014 20:20:01 EST Ventricular Rate:  101 PR Interval:  142 QRS Duration: 84 QT Interval:  366 QTC Calculation: 474 R Axis:   5 Text Interpretation:  Sinus tachycardia Confirmed by Han Lysne  MD, Sherial Ebrahim  ([id number]) on 05/03/2014 8:50:09 PM      MDM   Final diagnoses:  Left leg swelling  Left leg pain   I personally performed the services described in this documentation, which was scribed in my presence. The recorded information has been reviewed and is accurate.   Patient presents with mild calf pain and mild leg swelling. Patient had an operation on the back recently. Surgical wound healing well. No fevers, no chest pain no shortness of breath, no other symptoms.  Bedside ultrasound done no DVT prox femoral or saphenous veins. Unable to get formal ultrasound this evening.  Patient understands that this was not a complete ultrasound.  We were planning for discharge and heart rate noted to be 117. On reassessment patient looks well, no symptoms  except for back pain which is similar since surgery. Pain medicines ordered, basic blood work ordered.  Potassium low, oral potassium ordered. EKG no QT prolongation. d-dimer positive. Lovenox dose ordered. Patient has close follow-up with surgeon, ultrasound to be done to more. Heart rate lowered to 100 after pain meds.    Results and differential diagnosis were discussed with the patient/parent/guardian. Close follow up outpatient was discussed, comfortable with the plan.   Medications  enoxaparin (LOVENOX) injection 80 mg (not administered)  HYDROcodone-acetaminophen (NORCO/VICODIN) 5-325 MG per tablet 2 tablet (2 tablets Oral Given 05/03/14 1912)  potassium chloride SA (K-DUR,KLOR-CON) CR tablet 60 mEq (60 mEq Oral Given 05/03/14 2028)    Filed Vitals:   05/03/14 1659 05/03/14 1910  BP: 146/79 137/89  Pulse: 117 115  Temp: 98.2 F (36.8 C)   TempSrc: Oral   Resp: 20 20  Height: 5\' 7"  (1.702 m)   Weight: 175 lb (79.379 kg)   SpO2: 100% 98%    Final diagnoses:  Left leg swelling  Left leg pain      , MD 05/03/14 2056

## 2014-05-03 NOTE — ED Notes (Signed)
CRITICAL VALUE ALERT  Critical value received: Potassium 2.8  Date of notification:  05/03/14  Time of notification:  20:13  Critical value read back: Yes  Nurse who received alert:  Alda Lea, RN  MD notified:  Dr. Jodi Mourning  Time of notification:  20:13

## 2014-05-04 ENCOUNTER — Ambulatory Visit (HOSPITAL_COMMUNITY)
Admit: 2014-05-04 | Discharge: 2014-05-04 | Disposition: A | Discharge status: Home / Self Care | Payer: BC Managed Care – PPO | Source: Ambulatory Visit | Attending: Emergency Medicine | Admitting: Emergency Medicine

## 2014-05-04 ENCOUNTER — Other Ambulatory Visit (HOSPITAL_COMMUNITY): Payer: Self-pay | Admitting: Emergency Medicine

## 2014-05-04 DIAGNOSIS — M7122 Synovial cyst of popliteal space [Baker], left knee: Secondary | ICD-10-CM | POA: Diagnosis not present

## 2014-05-04 DIAGNOSIS — R52 Pain, unspecified: Secondary | ICD-10-CM | POA: Diagnosis present

## 2014-05-04 DIAGNOSIS — M7989 Other specified soft tissue disorders: Secondary | ICD-10-CM

## 2014-05-04 NOTE — ED Provider Notes (Addendum)
Ultrasound of left lower extremity negative for DVT. Results were discussed with patient. Possibility of Baker's cyst discussed  Donnetta Hutching, MD 05/04/14 1500  Donnetta Hutching, MD 05/04/14 1501

## 2014-05-10 ENCOUNTER — Encounter (HOSPITAL_COMMUNITY): Payer: Self-pay | Admitting: Adult Health

## 2014-05-10 ENCOUNTER — Emergency Department (HOSPITAL_COMMUNITY)
Admission: EM | Admit: 2014-05-10 | Discharge: 2014-05-11 | Disposition: A | Discharge status: Home / Self Care | Payer: BC Managed Care – PPO | Attending: Emergency Medicine | Admitting: Emergency Medicine

## 2014-05-10 DIAGNOSIS — Z8719 Personal history of other diseases of the digestive system: Secondary | ICD-10-CM | POA: Diagnosis not present

## 2014-05-10 DIAGNOSIS — Z880 Allergy status to penicillin: Secondary | ICD-10-CM | POA: Insufficient documentation

## 2014-05-10 DIAGNOSIS — R110 Nausea: Secondary | ICD-10-CM | POA: Insufficient documentation

## 2014-05-10 DIAGNOSIS — M069 Rheumatoid arthritis, unspecified: Secondary | ICD-10-CM | POA: Diagnosis not present

## 2014-05-10 DIAGNOSIS — Z79899 Other long term (current) drug therapy: Secondary | ICD-10-CM | POA: Insufficient documentation

## 2014-05-10 DIAGNOSIS — R079 Chest pain, unspecified: Secondary | ICD-10-CM | POA: Diagnosis not present

## 2014-05-10 DIAGNOSIS — E785 Hyperlipidemia, unspecified: Secondary | ICD-10-CM | POA: Diagnosis not present

## 2014-05-10 DIAGNOSIS — R61 Generalized hyperhidrosis: Secondary | ICD-10-CM | POA: Diagnosis not present

## 2014-05-10 DIAGNOSIS — Z8659 Personal history of other mental and behavioral disorders: Secondary | ICD-10-CM | POA: Diagnosis not present

## 2014-05-10 DIAGNOSIS — Z87442 Personal history of urinary calculi: Secondary | ICD-10-CM | POA: Insufficient documentation

## 2014-05-10 DIAGNOSIS — R000 Tachycardia, unspecified: Secondary | ICD-10-CM | POA: Diagnosis not present

## 2014-05-10 DIAGNOSIS — M1990 Unspecified osteoarthritis, unspecified site: Secondary | ICD-10-CM | POA: Insufficient documentation

## 2014-05-10 DIAGNOSIS — G8929 Other chronic pain: Secondary | ICD-10-CM | POA: Insufficient documentation

## 2014-05-10 DIAGNOSIS — M79672 Pain in left foot: Secondary | ICD-10-CM | POA: Diagnosis present

## 2014-05-10 DIAGNOSIS — R531 Weakness: Secondary | ICD-10-CM | POA: Diagnosis not present

## 2014-05-10 DIAGNOSIS — Z8701 Personal history of pneumonia (recurrent): Secondary | ICD-10-CM | POA: Insufficient documentation

## 2014-05-10 LAB — CBC
HCT: 39.8 % (ref 36.0–46.0)
Hemoglobin: 12.6 g/dL (ref 12.0–15.0)
MCH: 25.4 pg — ABNORMAL LOW (ref 26.0–34.0)
MCHC: 31.7 g/dL (ref 30.0–36.0)
MCV: 80.2 fL (ref 78.0–100.0)
Platelets: 362 10*3/uL (ref 150–400)
RBC: 4.96 MIL/uL (ref 3.87–5.11)
RDW: 14.5 % (ref 11.5–15.5)
WBC: 7.6 10*3/uL (ref 4.0–10.5)

## 2014-05-10 LAB — BASIC METABOLIC PANEL
Anion gap: 15 (ref 5–15)
BUN: <5 mg/dL — ABNORMAL LOW (ref 6–23)
CO2: 23 mmol/L (ref 19–32)
Calcium: 9.1 mg/dL (ref 8.4–10.5)
Chloride: 98 mEq/L (ref 96–112)
Creatinine, Ser: 0.53 mg/dL (ref 0.50–1.10)
GFR calc Af Amer: >90 mL/min (ref >90)
GFR calc non Af Amer: >90 mL/min (ref >90)
Glucose, Bld: 119 mg/dL — ABNORMAL HIGH (ref 70–99)
Potassium: 3.4 mmol/L — ABNORMAL LOW (ref 3.5–5.1)
Sodium: 136 mmol/L (ref 135–145)

## 2014-05-10 LAB — I-STAT TROPONIN, ED: Troponin i, poc: 0 ng/mL (ref 0.00–0.08)

## 2014-05-10 MED ORDER — HYDROMORPHONE HCL 1 MG/ML IJ SOLN
1.0000 mg | Freq: Once | INTRAMUSCULAR | Status: AC
  Administered 2014-05-11: 1 mg via INTRAVENOUS
  Filled 2014-05-10: qty 1

## 2014-05-10 MED ORDER — HYDROMORPHONE HCL 1 MG/ML IJ SOLN
1.0000 mg | Freq: Once | INTRAMUSCULAR | Status: AC
  Administered 2014-05-10: 1 mg via INTRAVENOUS
  Filled 2014-05-10: qty 1

## 2014-05-10 MED ORDER — SODIUM CHLORIDE 0.9 % IV BOLUS (SEPSIS)
1000.0000 mL | Freq: Once | INTRAVENOUS | Status: AC
  Administered 2014-05-10: 1000 mL via INTRAVENOUS

## 2014-05-10 MED ORDER — LORAZEPAM 2 MG/ML IJ SOLN
1.0000 mg | Freq: Once | INTRAMUSCULAR | Status: AC
  Administered 2014-05-10: 1 mg via INTRAVENOUS
  Filled 2014-05-10: qty 1

## 2014-05-10 NOTE — ED Notes (Addendum)
Presents with foot pain, reports nerve ending that is getting worse, she is unable to take gabapentin because it makes her feel bad, so she stopped.l left foot is painful. Back surgery done in [place]. Cms intact.  Reports low potassium, today after taking gabapentin had chest heaviness and SOB and diaphoresis, endorses nausea. HR is elevated at 122

## 2014-05-10 NOTE — ED Provider Notes (Signed)
CSN: [id number]     Arrival date & time 05/10/14  1951 History   First MD Initiated Contact with Patient 05/10/14 2155     Chief Complaint  Patient presents with  . Foot Pain  . Tachycardia     (Consider location/radiation/quality/duration/timing/severity/associated sxs/prior Treatment) Patient is a 53 y.o. female presenting with lower extremity pain and chest pain. The history is provided by the patient and the spouse.  Foot Pain This is a chronic problem. The problem occurs constantly. The problem has been gradually worsening. Associated symptoms include chest pain, diaphoresis, nausea and weakness. Pertinent negatives include no abdominal pain, arthralgias, chills, coughing, fatigue, fever, headaches, myalgias, rash, sore throat or vomiting.  Chest Pain Pain location:  Substernal area Pain quality: pressure   Pain radiates to:  Does not radiate Pain radiates to the back: no   Pain severity:  Severe Onset quality:  Sudden Timing:  Intermittent (only occurs when taking gabapentin) Progression:  Resolved Chronicity:  Recurrent Context comment:  Had just taken morning gabapentin Associated symptoms: diaphoresis, nausea and weakness   Associated symptoms: no abdominal pain, no back pain, no cough, no dizziness, no dysphagia, no fatigue, no fever, no headache, no shortness of breath and not vomiting     Past Medical History  Diagnosis Date  . Rheumatoid arthritis     takes Prednisone daily  . Complication of anesthesia     pt states after surgery in 2000 had to wear a heart monitor for 3 days.  Marland Kitchen PONV (postoperative nausea and vomiting)   . Peripheral edema     takes HCTZ daily   . Hyperlipidemia     takes Fenofibrate daily  . Pneumonia 21yrs ago    hx of  . History of bronchitis 43yrs ago  . Weakness     and tingling on left side  . Joint pain   . Joint swelling   . Chronic back pain     stenosis  . GERD (gastroesophageal reflux disease)     takes OTC meds if needed   . Osteoarthritis   . DDD (degenerative disc disease)   . Kidney stone     has one right now on the right side but not giving any problems  . History of blood transfusion     as a child  . Depression   . Chronic pain     takes Lexapro daily   Past Surgical History  Procedure Laterality Date  . Abdominal hysterectomy  2000  . Tubal ligation  24 yrs ago  . Lumbar laminectomy/decompression microdiscectomy Left 12/08/2013    Procedure: LEFT LUMBAR FOUR-FIVEW, LUMBAR FIVE-SACRAL ONE LAMINECTOMY;  Surgeon: Karn Cassis, MD;  Location: [facility];  Service: Neurosurgery;  Laterality: Left;   History reviewed. No pertinent family history. History  Substance Use Topics  . Smoking status: Never Smoker   . Smokeless tobacco: Not on file  . Alcohol Use: No   OB History    No data available     Review of Systems  Constitutional: Positive for diaphoresis. Negative for fever, chills, activity change, appetite change and fatigue.  HENT: Negative for facial swelling, rhinorrhea, sore throat, trouble swallowing and voice change.   Eyes: Negative for photophobia, pain and visual disturbance.  Respiratory: Negative for cough, shortness of breath, wheezing and stridor.   Cardiovascular: Positive for chest pain. Negative for leg swelling.  Gastrointestinal: Positive for nausea. Negative for vomiting, abdominal pain, constipation and anal bleeding.  Endocrine: Negative.   Genitourinary: Negative  for dysuria, vaginal bleeding, vaginal discharge and vaginal pain.  Musculoskeletal: Negative for myalgias, back pain and arthralgias.  Skin: Negative.  Negative for rash.  Allergic/Immunologic: Negative.   Neurological: Positive for weakness. Negative for dizziness, tremors, syncope and headaches.  Psychiatric/Behavioral: Negative for suicidal ideas, sleep disturbance and self-injury.  All other systems reviewed and are negative.     Allergies  Doxycycline; Gabapentin; Ibuprofen; Penicillins;  Plaquenil ; and Sulfa antibiotics  Home Medications   Prior to Admission medications   Medication Sig Start Date End Date Taking? Authorizing Provider  amitriptyline (ELAVIL) 50 MG tablet Take 100 mg by mouth at bedtime.    Yes Historical Provider, MD  gabapentin (NEURONTIN) 300 MG capsule Take 300 mg by mouth 2 (two) times daily.    Yes Historical Provider, MD  HYDROcodone-acetaminophen (NORCO) 5-325 MG per tablet Take 1-2 tablets by mouth every 4 (four) hours as needed. 05/03/14  Yes Enid Skeens, MD  HYDROmorphone (DILAUDID) 4 MG tablet Take one tab po q 4-6 hrs prn pain 03/26/14  Yes Tammy L. Triplett, PA-C  ciprofloxacin (CIPRO) 500 MG tablet Take 1 tablet (500 mg total) by mouth 2 (two) times daily. One po bid x 7 days Patient not taking: Reported on 04/09/2014 03/29/14   April K Palumbo-Rasch, MD  HYDROcodone-acetaminophen (NORCO/VICODIN) 5-325 MG per tablet Take 1 tablet by mouth every 6 (six) hours as needed for moderate pain or severe pain. 05/11/14   Lula Olszewski, MD  phenazopyridine (PYRIDIUM) 200 MG tablet Take 1 tablet (200 mg total) by mouth 3 (three) times daily. Patient not taking: Reported on 04/09/2014 03/29/14   April K Palumbo-Rasch, MD  potassium chloride SA (K-DUR,KLOR-CON) 20 MEQ tablet Take 1 tablet (20 mEq total) by mouth daily. 05/11/14   Lula Olszewski, MD  traMADol (ULTRAM) 50 MG tablet Take 1 tablet (50 mg total) by mouth every 6 (six) hours as needed. Patient not taking: Reported on 04/09/2014 03/29/14   April K Palumbo-Rasch, MD   BP 148/91 mmHg  Pulse 110  Temp(Src) 98.6 F (37 C) (Oral)  Resp 16  SpO2 96% Physical Exam  Constitutional: She is oriented to person, place, and time. She appears well-developed and well-nourished. No distress.  HENT:  Head: Normocephalic and atraumatic.  Right Ear: External ear normal.  Left Ear: External ear normal.  Mouth/Throat: Oropharynx is clear and moist. No oropharyngeal exudate.  Eyes: Conjunctivae and EOM are  normal. Pupils are equal, round, and reactive to light. No scleral icterus.  Neck: Normal range of motion. Neck supple. No JVD present. No tracheal deviation present. No thyromegaly present.  Cardiovascular: Normal rate, regular rhythm and intact distal pulses.  Exam reveals no gallop and no friction rub.   No murmur heard. 2+ DP and TP pulses equal bilaterally  Pulmonary/Chest: Effort normal and breath sounds normal. No respiratory distress. She has no wheezes. She has no rales.  Abdominal: Soft. Bowel sounds are normal. She exhibits no distension. There is no tenderness.  Musculoskeletal: Normal range of motion. She exhibits no edema or tenderness.  Neurological: She is alert and oriented to person, place, and time. No cranial nerve deficit. She exhibits normal muscle tone. Coordination normal.  5/5 strength in all 4 extremities. Normal Gait.  Sensation grossly intact in all nerve distributions of right left foot. Motor function intact.  Skin: Skin is warm and dry. She is not diaphoretic. No pallor.  Psychiatric: She has a normal mood and affect. She expresses no homicidal and no suicidal ideation. She expresses  no suicidal plans and no homicidal plans.  Nursing note and vitals reviewed.   ED Course  Procedures (including critical care time) Labs Review Labs Reviewed  CBC - Abnormal; Notable for the following:    MCH 25.4 (*)    All other components within normal limits  BASIC METABOLIC PANEL - Abnormal; Notable for the following:    Potassium 3.4 (*)    Glucose, Bld 119 (*)    BUN <5 (*)    All other components within normal limits  I-STAT TROPOININ, ED  I-STAT TROPOININ, ED    Imaging Review Ct Angio Chest Pe W/cm &/or Wo Cm  05/11/2014   CLINICAL DATA:  Elevated D-dimer, palpable abnormality behind left knee, back surgery 2 months ago  EXAM: CT ANGIOGRAPHY CHEST WITH CONTRAST  TECHNIQUE: Multidetector CT imaging of the chest was performed using the standard protocol during  bolus administration of intravenous contrast. Multiplanar CT image reconstructions and MIPs were obtained to evaluate the vascular anatomy.  CONTRAST:  56mL OMNIPAQUE IOHEXOL 350 MG/ML SOLN  COMPARISON:  None.  FINDINGS: No evidence of pulmonary embolism.  Mild atelectasis in in the posterior right lower lobe. No focal consolidation. No suspicious pulmonary nodules. No pleural effusion or pneumothorax.  Mild eventration of the right hemidiaphragm.  Heart is normal in size. No pericardial effusion. Mild atherosclerotic calcifications of the aortic arch.  Small mediastinal lymph nodes which do not meet pathologic CT size criteria.  Visualized upper abdomen is unremarkable.  Mild degenerative changes of the visualized thoracolumbar spine.  Review of the MIP images confirms the above findings.  IMPRESSION: No evidence of pulmonary embolism.  No evidence of acute cardiopulmonary disease.   Electronically Signed   By: Charline Bills M.D.   On: 05/11/2014 03:02     EKG Interpretation   Date/Time:  Monday May 10 2014 20:30:00 EST Ventricular Rate:  130 PR Interval:  134 QRS Duration: 78 QT Interval:  304 QTC Calculation: 447 R Axis:   -22 Text Interpretation:  Sinus tachycardia Moderate voltage criteria for LVH,  may be normal variant Borderline ECG No significant change was found  Confirmed by Manus Gunning  MD, STEPHEN [phone]) on 05/10/2014 10:19:33 PM      MDM   Final diagnoses:  Left foot pain  Chest pain  Tachycardia    The patient is a 53 y.o. F w/ hx of left foot radicular neuropathy who presents for continued foot pain as well as one episode of chest pain that occurred this morning after taking gabapentin. No new trauma, no indication to image foot. Rest of exam as above. Due to chest pain and persistent tachycardia delta troponins and CT PE study performed but are all negative. Patient discharged with pain medication and instructions to follow up in clinic for her known radiculopathy.  Patient expresses understanding and agreement with this plan and is discharged home with standard chest pain return precautions.   Patient seen with attending, Dr. Manus Gunning, who oversaw clinical decision making.     Lula Olszewski, MD 05/11/14 1504  Glynn Octave, MD 05/11/14 [phone]

## 2014-05-11 ENCOUNTER — Emergency Department (HOSPITAL_COMMUNITY): Payer: BC Managed Care – PPO

## 2014-05-11 LAB — I-STAT TROPONIN, ED: Troponin i, poc: 0 ng/mL (ref 0.00–0.08)

## 2014-05-11 MED ORDER — POTASSIUM CHLORIDE CRYS ER 20 MEQ PO TBCR: 20.0000 meq | EXTENDED_RELEASE_TABLET | Freq: Every day | ORAL | Status: DC

## 2014-05-11 MED ORDER — HYDROCODONE-ACETAMINOPHEN 5-325 MG PO TABS: 1.0000 | ORAL_TABLET | Freq: Four times a day (QID) | ORAL | Status: DC | PRN

## 2014-05-11 MED ORDER — IOHEXOL 350 MG/ML SOLN
80.0000 mL | Freq: Once | INTRAVENOUS | Status: AC | PRN
  Administered 2014-05-11: 80 mL via INTRAVENOUS

## 2014-05-11 NOTE — Discharge Instructions (Signed)
Follow up with your neurosurgeon in clinic about your continued foot pain.

## 2014-05-11 NOTE — ED Notes (Signed)
Patient transported to CT 

## 2014-05-20 ENCOUNTER — Other Ambulatory Visit: Payer: Self-pay | Admitting: Neurosurgery

## 2014-05-20 DIAGNOSIS — M5416 Radiculopathy, lumbar region: Secondary | ICD-10-CM

## 2014-05-24 ENCOUNTER — Other Ambulatory Visit: Payer: PRIVATE HEALTH INSURANCE

## 2014-05-24 ENCOUNTER — Inpatient Hospital Stay
Admission: RE | Admit: 2014-05-24 | Discharge: 2014-05-24 | Disposition: A | Discharge status: Home / Self Care | Payer: PRIVATE HEALTH INSURANCE | Source: Ambulatory Visit | Attending: Neurosurgery | Admitting: Neurosurgery

## 2014-05-24 NOTE — Discharge Instructions (Signed)
Myelogram Discharge Instructions  1. Go home and rest quietly for the next 24 hours.  It is important to lie flat for the next 24 hours.  Get up only to go to the restroom.  You may lie in the bed or on a couch on your back, your stomach, your left side or your right side.  You may have one pillow under your head.  You may have pillows between your knees while you are on your side or under your knees while you are on your back.  2. DO NOT drive today.  Recline the seat as far back as it will go, while still wearing your seat belt, on the way home.  3. You may get up to go to the bathroom as needed.  You may sit up for 10 minutes to eat.  You may resume your normal diet and medications unless otherwise indicated.  Drink lots of extra fluids today and tomorrow.  4. The incidence of headache, nausea, or vomiting is about 5% (one in 20 patients).  If you develop a headache, lie flat and drink plenty of fluids until the headache goes away.  Caffeinated beverages may be helpful.  If you develop severe nausea and vomiting or a headache that does not go away with flat bed rest, call [phone].  5. You may resume normal activities after your 24 hours of bed rest is over; however, do not exert yourself strongly or do any heavy lifting tomorrow. If when you get up you have a headache when standing, go back to bed and force fluids for another 24 hours.  6. Call your physician for a follow-up appointment.  The results of your myelogram will be sent directly to your physician by the following day.  7. If you have any questions or if complications develop after you arrive home, please call [phone].  Discharge instructions have been explained to the patient.  The patient, or the person responsible for the patient, fully understands these instructions.      May resume Amitriptyline on Jan. 12, 2016, after 11:00 am.

## 2014-05-25 ENCOUNTER — Emergency Department (HOSPITAL_COMMUNITY)
Admission: EM | Admit: 2014-05-25 | Discharge: 2014-05-25 | Disposition: A | Discharge status: Home / Self Care | Payer: BLUE CROSS/BLUE SHIELD | Attending: Emergency Medicine | Admitting: Emergency Medicine

## 2014-05-25 ENCOUNTER — Encounter (HOSPITAL_COMMUNITY): Payer: Self-pay | Admitting: *Deleted

## 2014-05-25 DIAGNOSIS — Z8719 Personal history of other diseases of the digestive system: Secondary | ICD-10-CM | POA: Insufficient documentation

## 2014-05-25 DIAGNOSIS — Z8701 Personal history of pneumonia (recurrent): Secondary | ICD-10-CM | POA: Diagnosis not present

## 2014-05-25 DIAGNOSIS — M1990 Unspecified osteoarthritis, unspecified site: Secondary | ICD-10-CM | POA: Insufficient documentation

## 2014-05-25 DIAGNOSIS — Z79899 Other long term (current) drug therapy: Secondary | ICD-10-CM | POA: Insufficient documentation

## 2014-05-25 DIAGNOSIS — F329 Major depressive disorder, single episode, unspecified: Secondary | ICD-10-CM | POA: Diagnosis not present

## 2014-05-25 DIAGNOSIS — Z880 Allergy status to penicillin: Secondary | ICD-10-CM | POA: Diagnosis not present

## 2014-05-25 DIAGNOSIS — G8929 Other chronic pain: Secondary | ICD-10-CM | POA: Diagnosis not present

## 2014-05-25 DIAGNOSIS — Z8639 Personal history of other endocrine, nutritional and metabolic disease: Secondary | ICD-10-CM | POA: Insufficient documentation

## 2014-05-25 DIAGNOSIS — Z87442 Personal history of urinary calculi: Secondary | ICD-10-CM | POA: Diagnosis not present

## 2014-05-25 DIAGNOSIS — R05 Cough: Secondary | ICD-10-CM | POA: Insufficient documentation

## 2014-05-25 DIAGNOSIS — R059 Cough, unspecified: Secondary | ICD-10-CM

## 2014-05-25 MED ORDER — BENZONATATE 100 MG PO CAPS: 100.0000 mg | ORAL_CAPSULE | Freq: Three times a day (TID) | ORAL | Status: DC

## 2014-05-25 MED ORDER — AZITHROMYCIN 250 MG PO TABS: 250.0000 mg | ORAL_TABLET | Freq: Every day | ORAL | Status: DC

## 2014-05-25 NOTE — Discharge Instructions (Signed)
Please call your doctor for a followup appointment within 24-48 hours. When you talk to your doctor please let them know that you were seen in the emergency department and have them acquire all of your records so that they can discuss the findings with you and formulate a treatment plan to fully care for your new and ongoing problems. ° °

## 2014-05-25 NOTE — ED Provider Notes (Signed)
CSN: [id number]     Arrival date & time 05/25/14  0706 History   First MD Initiated Contact with Patient 05/25/14 [phone]     Chief Complaint  Patient presents with  . Cough     (Consider location/radiation/quality/duration/timing/severity/associated sxs/prior Treatment) The history is provided by the patient.    The patient has had multiple visits to the emergency department recently for complaints of her legs bothering her, she has neuropathy, she has what appears to be claudication, she is followed by neurosurgery for back complaints, she had a CT angiogram performed in the last 2 weeks which was negative for pulmonary embolism.  Today the patient complains of  coughing which started yesterday, she reports that this is a dry cough, it is nonproductive, it is associated with a fever of 101 which was treated with Tylenol prior to arrival, she has no sore throat, no nasal congestion, no body aches. She did state that she was nauseated yesterday when she was supposed to get her CT scan at her neurosurgeons request, she did not go for the CT scan because of that. She reports a feeling of phlegm rattling around in her chest, she is unable to get up, she has not tried any over-the-counter medications, she reports a sick contact who has recently had similar symptoms and has not improved despite multiple antibiotic therapies including oral and injectable antibiotics and states his symptoms are similar to hers.  Past Medical History  Diagnosis Date  . Rheumatoid arthritis     takes Prednisone daily  . Complication of anesthesia     pt states after surgery in 2000 had to wear a heart monitor for 3 days.  Marland Kitchen PONV (postoperative nausea and vomiting)   . Peripheral edema     takes HCTZ daily   . Hyperlipidemia     takes Fenofibrate daily  . Pneumonia 78yrs ago    hx of  . History of bronchitis 11yrs ago  . Weakness     and tingling on left side  . Joint pain   . Joint swelling   . Chronic back  pain     stenosis  . GERD (gastroesophageal reflux disease)     takes OTC meds if needed  . Osteoarthritis   . DDD (degenerative disc disease)   . Kidney stone     has one right now on the right side but not giving any problems  . History of blood transfusion     as a child  . Depression   . Chronic pain     takes Lexapro daily   Past Surgical History  Procedure Laterality Date  . Abdominal hysterectomy  2000  . Tubal ligation  24 yrs ago  . Lumbar laminectomy/decompression microdiscectomy Left 12/08/2013    Procedure: LEFT LUMBAR FOUR-FIVEW, LUMBAR FIVE-SACRAL ONE LAMINECTOMY;  Surgeon: Karn Cassis, MD;  Location: [facility];  Service: Neurosurgery;  Laterality: Left;   No family history on file. History  Substance Use Topics  . Smoking status: Never Smoker   . Smokeless tobacco: Not on file  . Alcohol Use: No   OB History    No data available     Review of Systems  All other systems reviewed and are negative.     Allergies  Doxycycline; Gabapentin; Ibuprofen; Penicillins; Plaquenil ; and Sulfa antibiotics  Home Medications   Prior to Admission medications   Medication Sig Start Date End Date Taking? Authorizing Provider  amitriptyline (ELAVIL) 50 MG tablet Take 100 mg  by mouth at bedtime.     Historical Provider, MD  azithromycin (ZITHROMAX Z-PAK) 250 MG tablet Take 1 tablet (250 mg total) by mouth daily. 500mg  PO day 1, then 250mg  PO days 205 05/25/14   , MD  benzonatate (TESSALON) 100 MG capsule Take 1 capsule (100 mg total) by mouth every 8 (eight) hours. 05/25/14   Vida Roller, MD  ciprofloxacin (CIPRO) 500 MG tablet Take 1 tablet (500 mg total) by mouth 2 (two) times daily. One po bid x 7 days Patient not taking: Reported on 04/09/2014 03/29/14   April K Palumbo-Rasch, MD  gabapentin (NEURONTIN) 300 MG capsule Take 300 mg by mouth 2 (two) times daily.     Historical Provider, MD  HYDROcodone-acetaminophen (NORCO) 5-325 MG per tablet Take  1-2 tablets by mouth every 4 (four) hours as needed. 05/03/14   04-15-1970, MD  HYDROcodone-acetaminophen (NORCO/VICODIN) 5-325 MG per tablet Take 1 tablet by mouth every 6 (six) hours as needed for moderate pain or severe pain. 05/11/14   Enid Skeens, MD  HYDROmorphone (DILAUDID) 4 MG tablet Take one tab po q 4-6 hrs prn pain 03/26/14   Tammy L. Triplett, PA-C  phenazopyridine (PYRIDIUM) 200 MG tablet Take 1 tablet (200 mg total) by mouth 3 (three) times daily. Patient not taking: Reported on 04/09/2014 03/29/14   April K Palumbo-Rasch, MD  potassium chloride SA (K-DUR,KLOR-CON) 20 MEQ tablet Take 1 tablet (20 mEq total) by mouth daily. 05/11/14   04-15-1970, MD  traMADol (ULTRAM) 50 MG tablet Take 1 tablet (50 mg total) by mouth every 6 (six) hours as needed. Patient not taking: Reported on 04/09/2014 03/29/14   April K Palumbo-Rasch, MD   BP 108/70 mmHg  Pulse 104  Temp(Src) 97.4 F (36.3 C) (Oral)  Resp 18  Ht 5\' 7"  (1.702 m)  Wt 178 lb (80.74 kg)  BMI 27.87 kg/m2  SpO2 98% Physical Exam  Constitutional: She appears well-developed and well-nourished. No distress.  HENT:  Head: Normocephalic and atraumatic.  Mouth/Throat: Oropharynx is clear and moist. No oropharyngeal exudate.  Eyes: Conjunctivae and EOM are normal. Pupils are equal, round, and reactive to light. Right eye exhibits no discharge. Left eye exhibits no discharge. No scleral icterus.  Neck: Normal range of motion. Neck supple. No JVD present. No thyromegaly present.  Cardiovascular: Normal rate, regular rhythm, normal heart sounds and intact distal pulses.  Exam reveals no gallop and no friction rub.   No murmur heard. Pulmonary/Chest: Effort normal and breath sounds normal. No respiratory distress. She has no wheezes. She has no rales.  Abdominal: Soft. Bowel sounds are normal. She exhibits no distension and no mass. There is no tenderness.  Musculoskeletal: Normal range of motion. She exhibits no edema or  tenderness.  Lymphadenopathy:    She has no cervical adenopathy.  Neurological: She is alert. Coordination normal.  Skin: Skin is warm and dry. No rash noted. No erythema.  Psychiatric: She has a normal mood and affect. Her behavior is normal.  Nursing note and vitals reviewed.   ED Course  Procedures (including critical care time) Labs Review Labs Reviewed - No data to display  Imaging Review No results found.    MDM   Final diagnoses:  Cough    The patient has normal vital signs including a heart rate of 95 on my exam, she is afebrile, normotensive, oxygen 100%. She absolutely has no distress and normal lung sounds, she has no exertional symptoms, she has a nonproductive  cough. She will be prescribed a cough medication as below, she can follow-up safely with her family doctor in the outpatient setting, no indication for further imaging or workup. The patient is in agreement with the plan.  She was asked not to fill the antibiotic unless she developed worsening symtpoms or high fevers.  She is in agreement.  Meds given in ED:  Medications - No data to display  New Prescriptions   AZITHROMYCIN (ZITHROMAX Z-PAK) 250 MG TABLET    Take 1 tablet (250 mg total) by mouth daily. 500mg  PO day 1, then 250mg  PO days 205   BENZONATATE (TESSALON) 100 MG CAPSULE    Take 1 capsule (100 mg total) by mouth every 8 (eight) hours.        Vida Roller, MD 05/25/14 [phone]

## 2014-05-26 ENCOUNTER — Encounter (HOSPITAL_COMMUNITY): Payer: Self-pay | Admitting: *Deleted

## 2014-05-26 ENCOUNTER — Other Ambulatory Visit: Payer: PRIVATE HEALTH INSURANCE

## 2014-05-26 ENCOUNTER — Emergency Department (HOSPITAL_COMMUNITY)
Admission: EM | Admit: 2014-05-26 | Discharge: 2014-05-26 | Disposition: A | Discharge status: Home / Self Care | Payer: BLUE CROSS/BLUE SHIELD | Attending: Emergency Medicine | Admitting: Emergency Medicine

## 2014-05-26 DIAGNOSIS — R197 Diarrhea, unspecified: Secondary | ICD-10-CM | POA: Diagnosis not present

## 2014-05-26 DIAGNOSIS — Z8709 Personal history of other diseases of the respiratory system: Secondary | ICD-10-CM | POA: Diagnosis not present

## 2014-05-26 DIAGNOSIS — Z792 Long term (current) use of antibiotics: Secondary | ICD-10-CM | POA: Insufficient documentation

## 2014-05-26 DIAGNOSIS — M069 Rheumatoid arthritis, unspecified: Secondary | ICD-10-CM | POA: Diagnosis not present

## 2014-05-26 DIAGNOSIS — Z90710 Acquired absence of both cervix and uterus: Secondary | ICD-10-CM | POA: Diagnosis not present

## 2014-05-26 DIAGNOSIS — Z79899 Other long term (current) drug therapy: Secondary | ICD-10-CM | POA: Diagnosis not present

## 2014-05-26 DIAGNOSIS — F329 Major depressive disorder, single episode, unspecified: Secondary | ICD-10-CM | POA: Diagnosis not present

## 2014-05-26 DIAGNOSIS — Z9851 Tubal ligation status: Secondary | ICD-10-CM | POA: Diagnosis not present

## 2014-05-26 DIAGNOSIS — G8929 Other chronic pain: Secondary | ICD-10-CM | POA: Insufficient documentation

## 2014-05-26 DIAGNOSIS — R1110 Vomiting, unspecified: Secondary | ICD-10-CM | POA: Diagnosis present

## 2014-05-26 DIAGNOSIS — E785 Hyperlipidemia, unspecified: Secondary | ICD-10-CM | POA: Insufficient documentation

## 2014-05-26 DIAGNOSIS — Z87442 Personal history of urinary calculi: Secondary | ICD-10-CM | POA: Insufficient documentation

## 2014-05-26 DIAGNOSIS — N390 Urinary tract infection, site not specified: Secondary | ICD-10-CM | POA: Insufficient documentation

## 2014-05-26 DIAGNOSIS — Z880 Allergy status to penicillin: Secondary | ICD-10-CM | POA: Diagnosis not present

## 2014-05-26 DIAGNOSIS — K219 Gastro-esophageal reflux disease without esophagitis: Secondary | ICD-10-CM | POA: Insufficient documentation

## 2014-05-26 DIAGNOSIS — Z8701 Personal history of pneumonia (recurrent): Secondary | ICD-10-CM | POA: Diagnosis not present

## 2014-05-26 LAB — URINALYSIS, ROUTINE W REFLEX MICROSCOPIC
Glucose, UA: NEGATIVE mg/dL
Hgb urine dipstick: NEGATIVE
Leukocytes, UA: NEGATIVE
Nitrite: POSITIVE — AB
Protein, ur: 30 mg/dL — AB
Specific Gravity, Urine: >1.03 — ABNORMAL HIGH (ref 1.005–1.030)
Urobilinogen, UA: 0.2 mg/dL (ref 0.0–1.0)
pH: 5.5 (ref 5.0–8.0)

## 2014-05-26 LAB — CBC WITH DIFFERENTIAL/PLATELET
Basophils Absolute: 0 10*3/uL (ref 0.0–0.1)
Basophils Relative: 0 % (ref 0–1)
Eosinophils Absolute: 0.2 10*3/uL (ref 0.0–0.7)
Eosinophils Relative: 3 % (ref 0–5)
HCT: 37.3 % (ref 36.0–46.0)
Hemoglobin: 11.9 g/dL — ABNORMAL LOW (ref 12.0–15.0)
Lymphocytes Relative: 12 % (ref 12–46)
Lymphs Abs: 0.7 10*3/uL (ref 0.7–4.0)
MCH: 25.3 pg — ABNORMAL LOW (ref 26.0–34.0)
MCHC: 31.9 g/dL (ref 30.0–36.0)
MCV: 79.4 fL (ref 78.0–100.0)
Monocytes Absolute: 0.9 10*3/uL (ref 0.1–1.0)
Monocytes Relative: 16 % — ABNORMAL HIGH (ref 3–12)
Neutro Abs: 4 10*3/uL (ref 1.7–7.7)
Neutrophils Relative %: 69 % (ref 43–77)
Platelets: 376 10*3/uL (ref 150–400)
RBC: 4.7 MIL/uL (ref 3.87–5.11)
RDW: 14.3 % (ref 11.5–15.5)
WBC: 5.7 10*3/uL (ref 4.0–10.5)

## 2014-05-26 LAB — URINE MICROSCOPIC-ADD ON

## 2014-05-26 LAB — I-STAT CHEM 8, ED
BUN: <3 mg/dL — ABNORMAL LOW (ref 6–23)
Calcium, Ion: 1.02 mmol/L — ABNORMAL LOW (ref 1.12–1.23)
Chloride: 99 mEq/L (ref 96–112)
Creatinine, Ser: 0.4 mg/dL — ABNORMAL LOW (ref 0.50–1.10)
Glucose, Bld: 115 mg/dL — ABNORMAL HIGH (ref 70–99)
HCT: 41 % (ref 36.0–46.0)
Hemoglobin: 13.9 g/dL (ref 12.0–15.0)
Potassium: 3.1 mmol/L — ABNORMAL LOW (ref 3.5–5.1)
Sodium: 137 mmol/L (ref 135–145)
TCO2: 24 mmol/L (ref 0–100)

## 2014-05-26 MED ORDER — ONDANSETRON 4 MG PO TBDP: 4.0000 mg | ORAL_TABLET | Freq: Three times a day (TID) | ORAL | Status: DC | PRN

## 2014-05-26 MED ORDER — ONDANSETRON 8 MG PO TBDP
8.0000 mg | ORAL_TABLET | Freq: Once | ORAL | Status: AC
  Administered 2014-05-26: 8 mg via ORAL
  Filled 2014-05-26: qty 1

## 2014-05-26 MED ORDER — SODIUM CHLORIDE 0.9 % IV BOLUS (SEPSIS)
1000.0000 mL | Freq: Once | INTRAVENOUS | Status: AC
  Administered 2014-05-26: 1000 mL via INTRAVENOUS

## 2014-05-26 MED ORDER — CIPROFLOXACIN HCL 500 MG PO TABS: 500.0000 mg | ORAL_TABLET | Freq: Two times a day (BID) | ORAL | Status: DC

## 2014-05-26 NOTE — ED Notes (Signed)
Pt states no appetite for several days. States fever, vomiting and diarrhea began yesterday. Chills. States she last vomited 2 hours PTA.

## 2014-05-26 NOTE — ED Notes (Signed)
Pt states she was seen at [facility] yesterday but symptoms have gotten worse. V/D began after ED visit per pt.

## 2014-05-26 NOTE — Discharge Instructions (Signed)

## 2014-05-26 NOTE — ED Notes (Signed)
Pt drank coke, without nausea, voided scant amount

## 2014-05-26 NOTE — ED Notes (Signed)
Patient does not have to urinate at this time, she will try again later.

## 2014-05-26 NOTE — ED Notes (Signed)
Pt states she has had 3 episodes of diarrhea while in ED. Sprite sit better on stomach than Coke.

## 2014-05-26 NOTE — ED Notes (Signed)
Patient given discharge instruction, verbalized understand. IV removed, band aid applied. Patient ambulatory out of the department.  

## 2014-05-26 NOTE — ED Provider Notes (Signed)
CSN: [id number]     Arrival date & time 05/26/14  1024 History   First MD Initiated Contact with Patient 05/26/14 1036     Chief Complaint  Patient presents with  . Emesis     (Consider location/radiation/quality/duration/timing/severity/associated sxs/prior Treatment) HPI Comments: Pt states that she comes in with multiple complaints of vomiting and diarrhea that started yesterday. Pt states that she was seen at [facility] yesterday because she was dry heaving and so they sent her home. Denies fever, abdominal pain, chest pain or sob. Hasn't tried anything for the symptoms. States that she couldn't keep medicine down this morning. Has had chills  The history is provided by the patient. No language interpreter was used.    Past Medical History  Diagnosis Date  . Rheumatoid arthritis     takes Prednisone daily  . Complication of anesthesia     pt states after surgery in 2000 had to wear a heart monitor for 3 days.  Marland Kitchen PONV (postoperative nausea and vomiting)   . Peripheral edema     takes HCTZ daily   . Hyperlipidemia     takes Fenofibrate daily  . Pneumonia 50yrs ago    hx of  . History of bronchitis 61yrs ago  . Weakness     and tingling on left side  . Joint pain   . Joint swelling   . Chronic back pain     stenosis  . GERD (gastroesophageal reflux disease)     takes OTC meds if needed  . Osteoarthritis   . DDD (degenerative disc disease)   . Kidney stone     has one right now on the right side but not giving any problems  . History of blood transfusion     as a child  . Depression   . Chronic pain     takes Lexapro daily   Past Surgical History  Procedure Laterality Date  . Abdominal hysterectomy  2000  . Tubal ligation  24 yrs ago  . Lumbar laminectomy/decompression microdiscectomy Left 12/08/2013    Procedure: LEFT LUMBAR FOUR-FIVEW, LUMBAR FIVE-SACRAL ONE LAMINECTOMY;  Surgeon: Karn Cassis, MD;  Location: [facility];  Service: Neurosurgery;  Laterality: Left;    No family history on file. History  Substance Use Topics  . Smoking status: Never Smoker   . Smokeless tobacco: Not on file  . Alcohol Use: No   OB History    No data available     Review of Systems  All other systems reviewed and are negative.     Allergies  Doxycycline; Gabapentin; Ibuprofen; Penicillins; Plaquenil ; and Sulfa antibiotics  Home Medications   Prior to Admission medications   Medication Sig Start Date End Date Taking? Authorizing Provider  amitriptyline (ELAVIL) 50 MG tablet Take 100 mg by mouth at bedtime.    Yes Historical Provider, MD  azithromycin (ZITHROMAX Z-PAK) 250 MG tablet Take 1 tablet (250 mg total) by mouth daily. 500mg  PO day 1, then 250mg  PO days 205 05/25/14  Yes , MD  benzonatate (TESSALON) 100 MG capsule Take 1 capsule (100 mg total) by mouth every 8 (eight) hours. 05/25/14  Yes Vida Roller, MD  gabapentin (NEURONTIN) 300 MG capsule Take 300 mg by mouth 2 (two) times daily.    Yes Historical Provider, MD  HYDROmorphone (DILAUDID) 4 MG tablet Take one tab po q 4-6 hrs prn pain 03/26/14  Yes Tammy L. Triplett, PA-C  potassium chloride SA (K-DUR,KLOR-CON) 20 MEQ tablet Take  1 tablet (20 mEq total) by mouth daily. 05/11/14  Yes Lula Olszewski, MD  ciprofloxacin (CIPRO) 500 MG tablet Take 1 tablet (500 mg total) by mouth 2 (two) times daily. One po bid x 7 days Patient not taking: Reported on 04/09/2014 03/29/14   April K Palumbo-Rasch, MD  HYDROcodone-acetaminophen [facility]) 5-325 MG per tablet Take 1-2 tablets by mouth every 4 (four) hours as needed. Patient not taking: Reported on 05/26/2014 05/03/14   Enid Skeens, MD  HYDROcodone-acetaminophen (NORCO/VICODIN) 5-325 MG per tablet Take 1 tablet by mouth every 6 (six) hours as needed for moderate pain or severe pain. Patient not taking: Reported on 05/26/2014 05/11/14   Lula Olszewski, MD  phenazopyridine (PYRIDIUM) 200 MG tablet Take 1 tablet (200 mg total) by mouth 3 (three) times  daily. Patient not taking: Reported on 04/09/2014 03/29/14   April K Palumbo-Rasch, MD  traMADol (ULTRAM) 50 MG tablet Take 1 tablet (50 mg total) by mouth every 6 (six) hours as needed. Patient not taking: Reported on 04/09/2014 03/29/14   April K Palumbo-Rasch, MD   BP 145/78 mmHg  Pulse 115  Temp(Src) 97.9 F (36.6 C) (Oral)  Resp 18  Ht 5\' 7"  (1.702 m)  Wt 175 lb (79.379 kg)  BMI 27.40 kg/m2  SpO2 97% Physical Exam  Constitutional: She is oriented to person, place, and time. She appears well-developed and well-nourished.  HENT:  Head: Atraumatic.  Dry mucous membranes  Cardiovascular: Normal rate and regular rhythm.   Pulmonary/Chest: Effort normal and breath sounds normal.  Abdominal: Soft. Bowel sounds are normal. There is no tenderness.  Musculoskeletal: Normal range of motion.  Neurological: She is alert and oriented to person, place, and time. Coordination normal.  Skin: Skin is warm and dry.  Psychiatric: She has a normal mood and affect.  Nursing note and vitals reviewed.   ED Course  Procedures (including critical care time) Labs Review Labs Reviewed  CBC WITH DIFFERENTIAL - Abnormal; Notable for the following:    Hemoglobin 11.9 (*)    MCH 25.3 (*)    Monocytes Relative 16 (*)    All other components within normal limits  URINALYSIS, ROUTINE W REFLEX MICROSCOPIC - Abnormal; Notable for the following:    APPearance HAZY (*)    Specific Gravity, Urine >1.030 (*)    Bilirubin Urine SMALL (*)    Ketones, ur TRACE (*)    Protein, ur 30 (*)    Nitrite POSITIVE (*)    All other components within normal limits  URINE MICROSCOPIC-ADD ON - Abnormal; Notable for the following:    Squamous Epithelial / LPF MANY (*)    Bacteria, UA MANY (*)    All other components within normal limits  I-STAT CHEM 8, ED - Abnormal; Notable for the following:    Potassium 3.1 (*)    BUN <3 (*)    Creatinine, Ser 0.40 (*)    Glucose, Bld 115 (*)    Calcium, Ion 1.02 (*)    All  other components within normal limits  URINE CULTURE    Imaging Review No results found.   EKG Interpretation None      MDM   Final diagnoses:  UTI (lower urinary tract infection)  Vomiting and diarrhea    Pt nitrite pos in urine. Will treat for uti. Pt is tolerating po without any problem. Will send home with cipro and zofran. Pt is in agreement with plan    , NP 05/26/14 1636  05/28/14, MD 05/27/14  0831 

## 2014-05-28 LAB — URINE CULTURE: Colony Count: >=100000

## 2014-05-31 ENCOUNTER — Telehealth (HOSPITAL_COMMUNITY): Payer: Self-pay

## 2014-05-31 NOTE — Telephone Encounter (Signed)
Post ED Visit - Positive Culture Follow-up  Culture report reviewed by antimicrobial stewardship pharmacist: []  Wes Dulaney, Pharm.D., BCPS []  , .D., BCPS []  Celedonio Miyamoto, Pharm.D., BCPS []  [place], .D., BCPS, AAHIVP []  Georgina Pillion, Pharm.D., BCPS, AAHIVP []  , [place].D., BCPS  Positive Urine culture, >/= 100,000 colonies -> Klebsiella Pneumoniae Treated with Ciprofloxacin, organism sensitive to the same and no further patient follow-up is required at this time.  [address] 05/31/2014, 5:27 AM

## 2014-06-01 ENCOUNTER — Ambulatory Visit
Admission: RE | Admit: 2014-06-01 | Discharge: 2014-06-01 | Disposition: A | Discharge status: Home / Self Care | Payer: BLUE CROSS/BLUE SHIELD | Source: Ambulatory Visit | Attending: Neurosurgery | Admitting: Neurosurgery

## 2014-06-01 DIAGNOSIS — M48061 Spinal stenosis, lumbar region without neurogenic claudication: Secondary | ICD-10-CM

## 2014-06-01 DIAGNOSIS — M5416 Radiculopathy, lumbar region: Secondary | ICD-10-CM

## 2014-06-01 DIAGNOSIS — M5136 Other intervertebral disc degeneration, lumbar region: Secondary | ICD-10-CM

## 2014-06-01 MED ORDER — DIAZEPAM 5 MG PO TABS
10.0000 mg | ORAL_TABLET | Freq: Once | ORAL | Status: AC
  Administered 2014-06-01: 10 mg via ORAL

## 2014-06-01 MED ORDER — IOHEXOL 180 MG/ML  SOLN
15.0000 mL | Freq: Once | INTRAMUSCULAR | Status: AC | PRN
  Administered 2014-06-01: 15 mL via INTRATHECAL

## 2014-06-01 NOTE — Discharge Instructions (Signed)
Myelogram Discharge Instructions  1. Go home and rest quietly for the next 24 hours.  It is important to lie flat for the next 24 hours.  Get up only to go to the restroom.  You may lie in the bed or on a couch on your back, your stomach, your left side or your right side.  You may have one pillow under your head.  You may have pillows between your knees while you are on your side or under your knees while you are on your back.  2. DO NOT drive today.  Recline the seat as far back as it will go, while still wearing your seat belt, on the way home.  3. You may get up to go to the bathroom as needed.  You may sit up for 10 minutes to eat.  You may resume your normal diet and medications unless otherwise indicated.  Drink lots of extra fluids today and tomorrow.  4. The incidence of headache, nausea, or vomiting is about 5% (one in 20 patients).  If you develop a headache, lie flat and drink plenty of fluids until the headache goes away.  Caffeinated beverages may be helpful.  If you develop severe nausea and vomiting or a headache that does not go away with flat bed rest, call [phone].  5. You may resume normal activities after your 24 hours of bed rest is over; however, do not exert yourself strongly or do any heavy lifting tomorrow. If when you get up you have a headache when standing, go back to bed and force fluids for another 24 hours.  6. Call your physician for a follow-up appointment.  The results of your myelogram will be sent directly to your physician by the following day.  7. If you have any questions or if complications develop after you arrive home, please call [phone].  Discharge instructions have been explained to the patient.  The patient, or the person responsible for the patient, fully understands these instructions.      May resume Amitriptyline on Jan. 20, 2016, after 1:00 pm.

## 2014-06-01 NOTE — Progress Notes (Signed)
Pt states she has been off Amitriptyline for the past 2 days.  Discharge instructions explained to pt.

## 2014-06-11 ENCOUNTER — Other Ambulatory Visit: Payer: Self-pay | Admitting: Neurosurgery

## 2014-06-18 ENCOUNTER — Encounter (HOSPITAL_COMMUNITY): Payer: Self-pay | Admitting: Pharmacy Technician

## 2014-06-21 ENCOUNTER — Other Ambulatory Visit (HOSPITAL_COMMUNITY): Payer: Self-pay | Admitting: *Deleted

## 2014-06-21 ENCOUNTER — Encounter (HOSPITAL_COMMUNITY): Payer: Self-pay

## 2014-06-21 ENCOUNTER — Encounter (HOSPITAL_COMMUNITY)
Admission: RE | Admit: 2014-06-21 | Discharge: 2014-06-21 | Disposition: A | Discharge status: Home / Self Care | Payer: BLUE CROSS/BLUE SHIELD | Source: Ambulatory Visit | Attending: Neurosurgery | Admitting: Neurosurgery

## 2014-06-21 LAB — BASIC METABOLIC PANEL
Anion gap: 6 (ref 5–15)
BUN: 6 mg/dL (ref 6–23)
CO2: 32 mmol/L (ref 19–32)
Calcium: 9.2 mg/dL (ref 8.4–10.5)
Chloride: 98 mmol/L (ref 96–112)
Creatinine, Ser: 0.52 mg/dL (ref 0.50–1.10)
GFR calc Af Amer: >90 mL/min (ref >90)
GFR calc non Af Amer: >90 mL/min (ref >90)
Glucose, Bld: 101 mg/dL — ABNORMAL HIGH (ref 70–99)
Potassium: 3.7 mmol/L (ref 3.5–5.1)
Sodium: 136 mmol/L (ref 135–145)

## 2014-06-21 LAB — CBC
HCT: 42.3 % (ref 36.0–46.0)
Hemoglobin: 13 g/dL (ref 12.0–15.0)
MCH: 24.1 pg — ABNORMAL LOW (ref 26.0–34.0)
MCHC: 30.7 g/dL (ref 30.0–36.0)
MCV: 78.3 fL (ref 78.0–100.0)
Platelets: 361 10*3/uL (ref 150–400)
RBC: 5.4 MIL/uL — ABNORMAL HIGH (ref 3.87–5.11)
RDW: 15.5 % (ref 11.5–15.5)
WBC: 7.1 10*3/uL (ref 4.0–10.5)

## 2014-06-21 LAB — SURGICAL PCR SCREEN
MRSA, PCR: NEGATIVE
Staphylococcus aureus: NEGATIVE

## 2014-06-21 MED ORDER — VANCOMYCIN HCL IN DEXTROSE 1-5 GM/200ML-% IV SOLN
1000.0000 mg | INTRAVENOUS | Status: AC
  Administered 2014-06-22: 1000 mg via INTRAVENOUS
  Filled 2014-06-21: qty 200

## 2014-06-21 NOTE — H&P (Signed)
Kaitlin Fleming is an 54 y.o. female.   Chief Complaint: left leg pain HPI: patient who underwent  Lumbar fusion from l4to s1. Went home 48 hours later but came to see me because of pain at the level of the left foot. She was seen also in the er where a ct shoed displacement of tle left l4 screw laterally. Conservative treatment was done with medications . Emg/ncv showed no evidence of radiculopathies. A lumbar myelogram showed lateral palcement of left l4 screw with suggestion of pseudoarthrosis but a new finding of l3-4 stenosis worse in the left and possible spondylolisthesis  Past Medical History  Diagnosis Date  . Complication of anesthesia     pt states after surgery in 2000 had to wear a heart monitor for 3 days.  Marland Kitchen PONV (postoperative nausea and vomiting)   . Peripheral edema     takes HCTZ daily   . Hyperlipidemia     takes Fenofibrate daily  . Pneumonia 67yr ago    hx of  . History of bronchitis 89yrago  . Weakness     and tingling on left side  . Joint pain   . Joint swelling   . Chronic back pain     stenosis  . GERD (gastroesophageal reflux disease)     takes OTC meds if needed  . Kidney stone     has one right now on the right side but not giving any problems  . History of blood transfusion     as a child  . Depression   . Chronic pain     takes Lexapro daily  . Rheumatoid arthritis   . Osteoarthritis   . DDD (degenerative disc disease)     Past Surgical History  Procedure Laterality Date  . Abdominal hysterectomy  2000  . Tubal ligation  24 yrs ago  . Lumbar laminectomy/decompression microdiscectomy Left 12/08/2013    Procedure: LEFT LUMBAR FOUR-FIVEW, LUMBAR FIVE-SACRAL ONE LAMINECTOMY;  Surgeon: ErFloyce StakesMD;  Location: [facility];  Service: Neurosurgery;  Laterality: Left;  . Back surgery  03/10/14    lumbar fusion    No family history on file. Social History:  reports that she has never smoked. She has never used smokeless tobacco. She  reports that she does not drink alcohol or use illicit drugs.  Allergies:  Allergies  Allergen Reactions  . Plaquenil [Hydroxychloroquine Sulfate] Nausea And Vomiting and Other (See Comments)    GI UPSET  . Doxycycline Other (See Comments)    blisters  . Ibuprofen Hives  . Penicillins Other (See Comments)    Breaks out  . Sulfa Antibiotics Other (See Comments)    Breaks out    No prescriptions prior to admission    Results for orders placed or performed during the hospital encounter of 06/21/14 (from the past 48 hour(s))  Surgical pcr screen     Status: None   Collection Time: 06/21/14  1:33 PM  Result Value Ref Range   MRSA, PCR NEGATIVE NEGATIVE   Staphylococcus aureus NEGATIVE NEGATIVE    Comment:        The Xpert SA Assay (FDA approved for NASAL specimens in patients over 2156ears of age), is one component of a comprehensive surveillance program.  Test performance has been validated by [facility] patients greater than or equal to 1 45ear old. It is not intended to diagnose infection nor to guide or monitor treatment.   Basic metabolic panel     Status:  Abnormal   Collection Time: 06/21/14  1:33 PM  Result Value Ref Range   Sodium 136 135 - 145 mmol/L   Potassium 3.7 3.5 - 5.1 mmol/L   Chloride 98 96 - 112 mmol/L   CO2 32 19 - 32 mmol/L   Glucose, Bld 101 (H) 70 - 99 mg/dL   BUN 6 6 - 23 mg/dL   Creatinine, Ser 0.52 0.50 - 1.10 mg/dL   Calcium 9.2 8.4 - 10.5 mg/dL   GFR calc non Af Amer >90 >90 mL/min   GFR calc Af Amer >90 >90 mL/min    Comment: (NOTE) The eGFR has been calculated using the CKD EPI equation. This calculation has not been validated in all clinical situations. eGFR's persistently <90 mL/min signify possible Chronic Kidney Disease.    Anion gap 6 5 - 15  CBC     Status: Abnormal   Collection Time: 06/21/14  1:33 PM  Result Value Ref Range   WBC 7.1 4.0 - 10.5 K/uL   RBC 5.40 (H) 3.87 - 5.11 MIL/uL   Hemoglobin 13.0 12.0 - 15.0 g/dL    HCT 42.3 36.0 - 46.0 %   MCV 78.3 78.0 - 100.0 fL   MCH 24.1 (L) 26.0 - 34.0 pg   MCHC 30.7 30.0 - 36.0 g/dL   RDW 15.5 11.5 - 15.5 %   Platelets 361 150 - 400 K/uL   No results found.  Review of Systems  Constitutional: Negative.   HENT: Negative.   Eyes: Negative.   Respiratory: Negative.   Cardiovascular: Negative.   Gastrointestinal: Negative.   Genitourinary: Negative.   Musculoskeletal: Positive for back pain.  Skin: Negative.   Neurological: Positive for sensory change.  Endo/Heme/Allergies: Negative.   Psychiatric/Behavioral: Negative.     There were no vitals taken for this visit. Physical Exam hent, nl. Neck, nl. Cv. Nl. Lungs, clear. Abdomen, soft. Extremities nl. NEURO no evdience of weakness but sensory radiculopathy . dtr normal.   Assessment/Plan We are going to take her to surgery to explore the fusion. minimun will br to reposition the left l4 screw and use a large r on at l4 right. Also a left l3-4 laminotomy and foraminotomy.also to explore the l5 s1 screws . maximun will be to rpace all screws and probably extend the fusion to l3-4. We are going to use a permanent bone stimulator. She, her daughter and her mother are aware of risks and benefits  Kaitlin Fleming M 06/21/2014, 5:42 PM

## 2014-06-21 NOTE — Pre-Procedure Instructions (Signed)
Kaitlin Fleming  06/21/2014   Your procedure is scheduled on:  Tuesday, June 22, 2014 at 2:35 PM.   Report to [facility] Entrance "A" Admitting Office at 11:30 AM.   Call this number if you have problems the morning of surgery: [phone]   Remember:   Do not eat food or drink liquids after midnight tonight.   Take these medicines the morning of surgery with A SIP OF WATER: Gabapentin (Neurontin), Oxycodone or Tramadol - if needed.   Do not wear jewelry, make-up or nail polish.  Do not wear lotions, powders, or perfumes. You may wear deodorant.  Do not shave 48 hours prior to surgery. Men may shave face and neck.  Do not bring valuables to the hospital.  [facility] is not responsible                  for any belongings or valuables.               Contacts, dentures or bridgework may not be worn into surgery.  Leave suitcase in the car. After surgery it may be brought to your room.  For patients admitted to the hospital, discharge time is determined by your                treatment team.              Special Instructions: [place] - Preparing for Surgery  Before surgery, you can play an important role.  Because skin is not sterile, your skin needs to be as free of germs as possible.  You can reduce the number of germs on you skin by washing with CHG (chlorahexidine gluconate) soap before surgery.  CHG is an antiseptic cleaner which kills germs and bonds with the skin to continue killing germs even after washing.  Please DO NOT use if you have an allergy to CHG or antibacterial soaps.  If your skin becomes reddened/irritated stop using the CHG and inform your nurse when you arrive at Short Stay.  Do not shave (including legs and underarms) for at least 48 hours prior to the first CHG shower.  You may shave your face.  Please follow these instructions carefully:   1.  Shower with CHG Soap the night before surgery and the                                morning of  Surgery.  2.  If you choose to wash your hair, wash your hair first as usual with your       normal shampoo.  3.  After you shampoo, rinse your hair and body thoroughly to remove the                      Shampoo.  4.  Use CHG as you would any other liquid soap.  You can apply chg directly       to the skin and wash gently with scrungie or a clean washcloth.  5.  Apply the CHG Soap to your body ONLY FROM THE NECK DOWN.        Do not use on open wounds or open sores.  Avoid contact with your eyes, ears, mouth and genitals (private parts).  Wash genitals (private parts) with your normal soap.  6.  Wash thoroughly, paying special attention to the area where your surgery  will be performed.  7.  Thoroughly rinse your body with warm water from the neck down.  8.  DO NOT shower/wash with your normal soap after using and rinsing off       the CHG Soap.  9.  Pat yourself dry with a clean towel.            10.  Wear clean pajamas.            11.  Place clean sheets on your bed the night of your first shower and do not        sleep with pets.  Day of Surgery  Do not apply any lotions the morning of surgery.  Please wear clean clothes to the hospital.     Please read over the following fact sheets that you were given: Pain Booklet, Coughing and Deep Breathing, MRSA Information and Surgical Site Infection Prevention

## 2014-06-22 ENCOUNTER — Inpatient Hospital Stay (HOSPITAL_COMMUNITY): Payer: BLUE CROSS/BLUE SHIELD

## 2014-06-22 ENCOUNTER — Inpatient Hospital Stay (HOSPITAL_COMMUNITY): Payer: BLUE CROSS/BLUE SHIELD | Admitting: Certified Registered Nurse Anesthetist

## 2014-06-22 ENCOUNTER — Inpatient Hospital Stay (HOSPITAL_COMMUNITY)
Admission: RE | Admit: 2014-06-22 | Discharge: 2014-06-26 | DRG: 460 | Disposition: A | Discharge status: Home / Self Care | Payer: BLUE CROSS/BLUE SHIELD | Source: Ambulatory Visit | Attending: Neurosurgery | Admitting: Neurosurgery

## 2014-06-22 ENCOUNTER — Encounter (HOSPITAL_COMMUNITY): Payer: Self-pay | Admitting: *Deleted

## 2014-06-22 ENCOUNTER — Encounter (HOSPITAL_COMMUNITY)
Admission: RE | Disposition: A | Discharge status: Home / Self Care | Payer: Self-pay | Source: Ambulatory Visit | Attending: Neurosurgery

## 2014-06-22 DIAGNOSIS — Z981 Arthrodesis status: Secondary | ICD-10-CM

## 2014-06-22 DIAGNOSIS — K219 Gastro-esophageal reflux disease without esophagitis: Secondary | ICD-10-CM | POA: Diagnosis present

## 2014-06-22 DIAGNOSIS — M069 Rheumatoid arthritis, unspecified: Secondary | ICD-10-CM | POA: Diagnosis present

## 2014-06-22 DIAGNOSIS — M5416 Radiculopathy, lumbar region: Principal | ICD-10-CM | POA: Diagnosis present

## 2014-06-22 DIAGNOSIS — E785 Hyperlipidemia, unspecified: Secondary | ICD-10-CM | POA: Diagnosis present

## 2014-06-22 DIAGNOSIS — G8929 Other chronic pain: Secondary | ICD-10-CM | POA: Diagnosis present

## 2014-06-22 DIAGNOSIS — Z419 Encounter for procedure for purposes other than remedying health state, unspecified: Secondary | ICD-10-CM

## 2014-06-22 SURGERY — POSTERIOR LUMBAR FUSION 1 LEVEL
Anesthesia: General

## 2014-06-22 MED ORDER — SODIUM CHLORIDE 0.9 % IJ SOLN
3.0000 mL | INTRAMUSCULAR | Status: DC | PRN
  Administered 2014-06-25: 3 mL via INTRAVENOUS
  Filled 2014-06-22: qty 3

## 2014-06-22 MED ORDER — ROCURONIUM BROMIDE 100 MG/10ML IV SOLN
INTRAVENOUS | Status: DC | PRN
  Administered 2014-06-22: 40 mg via INTRAVENOUS

## 2014-06-22 MED ORDER — VANCOMYCIN HCL 1000 MG IV SOLR
INTRAVENOUS | Status: AC
  Filled 2014-06-22: qty 1000

## 2014-06-22 MED ORDER — MIDAZOLAM HCL 5 MG/5ML IJ SOLN
INTRAMUSCULAR | Status: DC | PRN
  Administered 2014-06-22: 2 mg via INTRAVENOUS

## 2014-06-22 MED ORDER — HYDROCHLOROTHIAZIDE 25 MG PO TABS
25.0000 mg | ORAL_TABLET | Freq: Every day | ORAL | Status: DC
  Administered 2014-06-23 – 2014-06-26 (×4): 25 mg via ORAL
  Filled 2014-06-22 (×5): qty 1

## 2014-06-22 MED ORDER — GLYCOPYRROLATE 0.2 MG/ML IJ SOLN
INTRAMUSCULAR | Status: DC | PRN
  Administered 2014-06-22: 0.6 mg via INTRAVENOUS

## 2014-06-22 MED ORDER — VANCOMYCIN HCL 1000 MG IV SOLR
INTRAVENOUS | Status: DC | PRN
  Administered 2014-06-22: 1000 mg

## 2014-06-22 MED ORDER — BENZONATATE 100 MG PO CAPS
100.0000 mg | ORAL_CAPSULE | Freq: Three times a day (TID) | ORAL | Status: DC
  Administered 2014-06-23 – 2014-06-26 (×10): 100 mg via ORAL
  Filled 2014-06-22 (×10): qty 1

## 2014-06-22 MED ORDER — DIPHENHYDRAMINE HCL 12.5 MG/5ML PO ELIX
12.5000 mg | ORAL_SOLUTION | Freq: Four times a day (QID) | ORAL | Status: DC | PRN
  Filled 2014-06-22: qty 5

## 2014-06-22 MED ORDER — LACTATED RINGERS IV SOLN
INTRAVENOUS | Status: DC
  Administered 2014-06-22: 12:00:00 via INTRAVENOUS

## 2014-06-22 MED ORDER — BUPIVACAINE LIPOSOME 1.3 % IJ SUSP
20.0000 mL | Freq: Once | INTRAMUSCULAR | Status: DC
  Filled 2014-06-22: qty 20

## 2014-06-22 MED ORDER — ONDANSETRON HCL 4 MG/2ML IJ SOLN
4.0000 mg | Freq: Four times a day (QID) | INTRAMUSCULAR | Status: DC | PRN
  Administered 2014-06-24: 4 mg via INTRAVENOUS
  Filled 2014-06-22 (×2): qty 2

## 2014-06-22 MED ORDER — SODIUM CHLORIDE 0.9 % IJ SOLN: 9.0000 mL | INTRAMUSCULAR | Status: DC | PRN

## 2014-06-22 MED ORDER — NALOXONE HCL 0.4 MG/ML IJ SOLN
0.4000 mg | INTRAMUSCULAR | Status: DC | PRN
  Filled 2014-06-22: qty 1

## 2014-06-22 MED ORDER — HYDROMORPHONE HCL 1 MG/ML IJ SOLN
INTRAMUSCULAR | Status: AC
  Filled 2014-06-22: qty 1

## 2014-06-22 MED ORDER — ONDANSETRON HCL 4 MG/2ML IJ SOLN
INTRAMUSCULAR | Status: DC | PRN
  Administered 2014-06-22: 4 mg via INTRAVENOUS

## 2014-06-22 MED ORDER — SODIUM CHLORIDE 0.9 % IV SOLN: 250.0000 mL | INTRAVENOUS | Status: DC

## 2014-06-22 MED ORDER — ACETAMINOPHEN 650 MG RE SUPP
650.0000 mg | RECTAL | Status: DC | PRN
  Filled 2014-06-22: qty 1

## 2014-06-22 MED ORDER — MORPHINE SULFATE (PF) 1 MG/ML IV SOLN
INTRAVENOUS | Status: DC
  Administered 2014-06-23: 13.5 mL via INTRAVENOUS
  Administered 2014-06-24: 7.5 mg via INTRAVENOUS
  Administered 2014-06-24: 6 mg via INTRAVENOUS
  Filled 2014-06-22: qty 25

## 2014-06-22 MED ORDER — DIPHENHYDRAMINE HCL 50 MG/ML IJ SOLN
12.5000 mg | Freq: Four times a day (QID) | INTRAMUSCULAR | Status: DC | PRN
  Filled 2014-06-22: qty 0.25

## 2014-06-22 MED ORDER — MORPHINE SULFATE (PF) 1 MG/ML IV SOLN
INTRAVENOUS | Status: DC
  Administered 2014-06-22 – 2014-06-23 (×4): via INTRAVENOUS

## 2014-06-22 MED ORDER — 0.9 % SODIUM CHLORIDE (POUR BTL) OPTIME
TOPICAL | Status: DC | PRN
  Administered 2014-06-22: 1000 mL

## 2014-06-22 MED ORDER — LIDOCAINE HCL (CARDIAC) 20 MG/ML IV SOLN
INTRAVENOUS | Status: DC | PRN
  Administered 2014-06-22: 60 mg via INTRAVENOUS

## 2014-06-22 MED ORDER — AZITHROMYCIN 250 MG PO TABS: 250.0000 mg | ORAL_TABLET | Freq: Every day | ORAL | Status: DC

## 2014-06-22 MED ORDER — PHENAZOPYRIDINE HCL 200 MG PO TABS
200.0000 mg | ORAL_TABLET | Freq: Three times a day (TID) | ORAL | Status: DC
  Administered 2014-06-23 – 2014-06-25 (×7): 200 mg via ORAL
  Filled 2014-06-22 (×13): qty 1

## 2014-06-22 MED ORDER — ONDANSETRON HCL 4 MG/2ML IJ SOLN
4.0000 mg | INTRAMUSCULAR | Status: DC | PRN
  Administered 2014-06-23: 4 mg via INTRAVENOUS
  Filled 2014-06-22 (×2): qty 2

## 2014-06-22 MED ORDER — POTASSIUM CHLORIDE CRYS ER 20 MEQ PO TBCR
20.0000 meq | EXTENDED_RELEASE_TABLET | Freq: Every day | ORAL | Status: DC
  Administered 2014-06-23 – 2014-06-26 (×4): 20 meq via ORAL
  Filled 2014-06-22 (×5): qty 1

## 2014-06-22 MED ORDER — VANCOMYCIN HCL IN DEXTROSE 1-5 GM/200ML-% IV SOLN
1000.0000 mg | Freq: Once | INTRAVENOUS | Status: AC
  Administered 2014-06-23: 1000 mg via INTRAVENOUS
  Filled 2014-06-22: qty 200

## 2014-06-22 MED ORDER — LACTATED RINGERS IV SOLN
INTRAVENOUS | Status: DC | PRN
  Administered 2014-06-22 (×2): via INTRAVENOUS

## 2014-06-22 MED ORDER — PROPOFOL 10 MG/ML IV BOLUS
INTRAVENOUS | Status: DC | PRN
  Administered 2014-06-22: 150 mg via INTRAVENOUS

## 2014-06-22 MED ORDER — SODIUM CHLORIDE 0.9 % IJ SOLN
3.0000 mL | Freq: Two times a day (BID) | INTRAMUSCULAR | Status: DC
  Administered 2014-06-23 – 2014-06-25 (×3): 3 mL via INTRAVENOUS

## 2014-06-22 MED ORDER — BUPIVACAINE LIPOSOME 1.3 % IJ SUSP
INTRAMUSCULAR | Status: DC | PRN
  Administered 2014-06-22: 20 mL

## 2014-06-22 MED ORDER — MORPHINE SULFATE (PF) 1 MG/ML IV SOLN
INTRAVENOUS | Status: AC
  Filled 2014-06-22: qty 25

## 2014-06-22 MED ORDER — FENTANYL CITRATE 0.05 MG/ML IJ SOLN
INTRAMUSCULAR | Status: AC
  Filled 2014-06-22: qty 5

## 2014-06-22 MED ORDER — MENTHOL 3 MG MT LOZG: 1.0000 | LOZENGE | OROMUCOSAL | Status: DC | PRN

## 2014-06-22 MED ORDER — SODIUM CHLORIDE 0.9 % IV SOLN
INTRAVENOUS | Status: DC
  Administered 2014-06-23: 13:00:00 via INTRAVENOUS

## 2014-06-22 MED ORDER — SURGIFOAM 100 EX MISC
CUTANEOUS | Status: DC | PRN
  Administered 2014-06-22: 16:00:00 via TOPICAL

## 2014-06-22 MED ORDER — PHENOL 1.4 % MT LIQD: 1.0000 | OROMUCOSAL | Status: DC | PRN

## 2014-06-22 MED ORDER — VECURONIUM BROMIDE 10 MG IV SOLR
INTRAVENOUS | Status: DC | PRN
  Administered 2014-06-22: 2 mg via INTRAVENOUS
  Administered 2014-06-22: 1 mg via INTRAVENOUS
  Administered 2014-06-22: 2 mg via INTRAVENOUS

## 2014-06-22 MED ORDER — HYDROMORPHONE HCL 1 MG/ML IJ SOLN
0.2500 mg | INTRAMUSCULAR | Status: DC | PRN
  Administered 2014-06-22 (×6): 0.5 mg via INTRAVENOUS

## 2014-06-22 MED ORDER — PROMETHAZINE HCL 25 MG/ML IJ SOLN: 6.2500 mg | INTRAMUSCULAR | Status: DC | PRN

## 2014-06-22 MED ORDER — PROPOFOL 10 MG/ML IV BOLUS
INTRAVENOUS | Status: AC
  Filled 2014-06-22: qty 20

## 2014-06-22 MED ORDER — OXYCODONE-ACETAMINOPHEN 5-325 MG PO TABS
2.0000 | ORAL_TABLET | ORAL | Status: DC | PRN
  Administered 2014-06-23: 1 via ORAL
  Administered 2014-06-24 – 2014-06-25 (×6): 2 via ORAL
  Filled 2014-06-22 (×6): qty 2

## 2014-06-22 MED ORDER — GABAPENTIN 300 MG PO CAPS
300.0000 mg | ORAL_CAPSULE | Freq: Three times a day (TID) | ORAL | Status: DC
  Administered 2014-06-23 – 2014-06-26 (×10): 300 mg via ORAL
  Filled 2014-06-22 (×11): qty 1

## 2014-06-22 MED ORDER — NEOSTIGMINE METHYLSULFATE 10 MG/10ML IV SOLN
INTRAVENOUS | Status: DC | PRN
  Administered 2014-06-22: 4 mg via INTRAVENOUS

## 2014-06-22 MED ORDER — FENTANYL CITRATE 0.05 MG/ML IJ SOLN
INTRAMUSCULAR | Status: DC | PRN
  Administered 2014-06-22: 50 ug via INTRAVENOUS
  Administered 2014-06-22 (×2): 100 ug via INTRAVENOUS
  Administered 2014-06-22 (×3): 50 ug via INTRAVENOUS
  Administered 2014-06-22: 100 ug via INTRAVENOUS
  Administered 2014-06-22: 50 ug via INTRAVENOUS
  Administered 2014-06-22: 100 ug via INTRAVENOUS
  Administered 2014-06-22 (×2): 50 ug via INTRAVENOUS

## 2014-06-22 MED ORDER — MIDAZOLAM HCL 2 MG/2ML IJ SOLN
INTRAMUSCULAR | Status: AC
  Filled 2014-06-22: qty 2

## 2014-06-22 MED ORDER — ZOLPIDEM TARTRATE 5 MG PO TABS: 5.0000 mg | ORAL_TABLET | Freq: Every evening | ORAL | Status: DC | PRN

## 2014-06-22 MED ORDER — DIAZEPAM 5 MG PO TABS
5.0000 mg | ORAL_TABLET | Freq: Four times a day (QID) | ORAL | Status: DC | PRN
  Administered 2014-06-22 – 2014-06-26 (×7): 5 mg via ORAL
  Filled 2014-06-22 (×7): qty 1

## 2014-06-22 MED ORDER — DIAZEPAM 5 MG PO TABS
ORAL_TABLET | ORAL | Status: AC
  Filled 2014-06-22: qty 1

## 2014-06-22 MED ORDER — ACETAMINOPHEN 325 MG PO TABS
650.0000 mg | ORAL_TABLET | ORAL | Status: DC | PRN
  Administered 2014-06-23: 650 mg via ORAL
  Filled 2014-06-22 (×2): qty 2

## 2014-06-22 SURGICAL SUPPLY — 59 items
BENZOIN TINCTURE PRP APPL 2/3 (GAUZE/BANDAGES/DRESSINGS) ×2 IMPLANT
BLADE CLIPPER SURG (BLADE) IMPLANT
BUR ACORN 6.0 (BURR) ×2 IMPLANT
BUR MATCHSTICK NEURO 3.0 LAGG (BURR) ×2 IMPLANT
CANISTER SUCT 3000ML (MISCELLANEOUS) ×2 IMPLANT
CAP LOCKING THREADED (Cap) ×6 IMPLANT
CONT SPEC 4OZ CLIKSEAL STRL BL (MISCELLANEOUS) ×2 IMPLANT
COVER BACK TABLE 60X90IN (DRAPES) ×2 IMPLANT
DRAPE C-ARM 42X72 X-RAY (DRAPES) ×4 IMPLANT
DRAPE LAPAROTOMY 100X72X124 (DRAPES) ×2 IMPLANT
DRAPE POUCH INSTRU U-SHP 10X18 (DRAPES) ×2 IMPLANT
DRSG OPSITE POSTOP 4X10 (GAUZE/BANDAGES/DRESSINGS) ×2 IMPLANT
DRSG PAD ABDOMINAL 8X10 ST (GAUZE/BANDAGES/DRESSINGS) IMPLANT
DURAPREP 26ML APPLICATOR (WOUND CARE) ×2 IMPLANT
ELECT REM PT RETURN 9FT ADLT (ELECTROSURGICAL) ×2
ELECTRODE REM PT RTRN 9FT ADLT (ELECTROSURGICAL) ×1 IMPLANT
EVACUATOR 1/8 PVC DRAIN (DRAIN) ×2 IMPLANT
GAUZE SPONGE 4X4 12PLY STRL (GAUZE/BANDAGES/DRESSINGS) ×2 IMPLANT
GAUZE SPONGE 4X4 16PLY XRAY LF (GAUZE/BANDAGES/DRESSINGS) ×2 IMPLANT
GLOVE BIOGEL M 8.0 STRL (GLOVE) ×2 IMPLANT
GLOVE EXAM NITRILE LRG STRL (GLOVE) IMPLANT
GLOVE EXAM NITRILE MD LF STRL (GLOVE) IMPLANT
GLOVE EXAM NITRILE XL STR (GLOVE) IMPLANT
GLOVE EXAM NITRILE XS STR PU (GLOVE) IMPLANT
GOWN STRL REUS W/ TWL LRG LVL3 (GOWN DISPOSABLE) ×1 IMPLANT
GOWN STRL REUS W/ TWL XL LVL3 (GOWN DISPOSABLE) IMPLANT
GOWN STRL REUS W/TWL 2XL LVL3 (GOWN DISPOSABLE) IMPLANT
GOWN STRL REUS W/TWL LRG LVL3 (GOWN DISPOSABLE) ×1
GOWN STRL REUS W/TWL XL LVL3 (GOWN DISPOSABLE)
KIT BASIN OR (CUSTOM PROCEDURE TRAY) ×2 IMPLANT
KIT INFUSE MEDIUM (Orthopedic Implant) ×2 IMPLANT
KIT ROOM TURNOVER OR (KITS) ×2 IMPLANT
NEEDLE HYPO 18GX1.5 BLUNT FILL (NEEDLE) IMPLANT
NEEDLE HYPO 21X1.5 SAFETY (NEEDLE) IMPLANT
NEEDLE HYPO 25X1 1.5 SAFETY (NEEDLE) IMPLANT
NS IRRIG 1000ML POUR BTL (IV SOLUTION) ×2 IMPLANT
PACK LAMINECTOMY NEURO (CUSTOM PROCEDURE TRAY) ×2 IMPLANT
PAD ARMBOARD 7.5X6 YLW CONV (MISCELLANEOUS) ×6 IMPLANT
PATTIES SURGICAL .5 X1 (DISPOSABLE) ×2 IMPLANT
PATTIES SURGICAL .5 X3 (DISPOSABLE) IMPLANT
ROD CREO 80MM SPINAL (Rod) ×2 IMPLANT
SCREW CREO THREADED 5.5X45MM (Screw) ×2 IMPLANT
SPONGE LAP 4X18 X RAY DECT (DISPOSABLE) IMPLANT
SPONGE NEURO XRAY DETECT 1X3 (DISPOSABLE) IMPLANT
SPONGE SURGIFOAM ABS GEL 100 (HEMOSTASIS) ×2 IMPLANT
STIMULATOR SPF PLUS 60/M (Stimulator) ×2 IMPLANT
STRIP BIOACTIVE VITOSS 25X100X (Neuro Prosthesis/Implant) ×2 IMPLANT
STRIP CLOSURE SKIN 1/2X4 (GAUZE/BANDAGES/DRESSINGS) ×2 IMPLANT
SUT VIC AB 1 CT1 18XBRD ANBCTR (SUTURE) ×2 IMPLANT
SUT VIC AB 1 CT1 8-18 (SUTURE) ×2
SUT VIC AB 2-0 CP2 18 (SUTURE) ×2 IMPLANT
SUT VIC AB 3-0 SH 8-18 (SUTURE) ×4 IMPLANT
SYR 20CC LL (SYRINGE) IMPLANT
SYR 20ML ECCENTRIC (SYRINGE) ×2 IMPLANT
SYR 5ML LL (SYRINGE) IMPLANT
TOWEL OR 17X24 6PK STRL BLUE (TOWEL DISPOSABLE) ×2 IMPLANT
TOWEL OR 17X26 10 PK STRL BLUE (TOWEL DISPOSABLE) ×2 IMPLANT
TRAY FOLEY CATH 14FRSI W/METER (CATHETERS) ×2 IMPLANT
WATER STERILE IRR 1000ML POUR (IV SOLUTION) ×2 IMPLANT

## 2014-06-22 NOTE — Transfer of Care (Signed)
Immediate Anesthesia Transfer of Care Note  Patient: Kaitlin Fleming  Procedure(s) Performed: Procedure(s): Lumbar three-four Laminectomy/foraminotomy, with bigger screw at Right Lumbar four and redo of screws on left side,implantable bone stimulator (N/A)  Patient Location: PACU  Anesthesia Type:General  Level of Consciousness: awake, alert  and oriented  Airway & Oxygen Therapy: Patient Spontanous Breathing and Patient connected to nasal cannula oxygen  Post-op Assessment: Report given to RN and Post -op Vital signs reviewed and stable  Post vital signs: Reviewed and stable  Last Vitals:  Filed Vitals:   06/22/14 1141  BP: 152/92  Pulse: 115  Temp: 36.6 C  Resp: 18    Complications: No apparent anesthesia complications

## 2014-06-22 NOTE — Anesthesia Preprocedure Evaluation (Signed)
Anesthesia Evaluation  Patient identified by MRN, date of birth, ID band  History of Anesthesia Complications (+) PONV  Airway Mallampati: II  TM Distance: >3 FB Neck ROM: Full    Dental   Pulmonary pneumonia -,  breath sounds clear to auscultation        Cardiovascular negative cardio ROS  Rhythm:Regular Rate:Normal     Neuro/Psych    GI/Hepatic Neg liver ROS, GERD-  ,  Endo/Other    Renal/GU Renal disease     Musculoskeletal  (+) Arthritis -,   Abdominal   Peds  Hematology   Anesthesia Other Findings   Reproductive/Obstetrics                             Anesthesia Physical Anesthesia Plan  ASA: III  Anesthesia Plan: General   Post-op Pain Management:    Induction: Intravenous  Airway Management Planned: Oral ETT  Additional Equipment:   Intra-op Plan:   Post-operative Plan: Extubation in OR  Informed Consent: I have reviewed the patients History and Physical, chart, labs and discussed the procedure including the risks, benefits and alternatives for the proposed anesthesia with the patient or authorized representative who has indicated his/her understanding and acceptance.   Dental advisory given  Plan Discussed with: CRNA and Anesthesiologist  Anesthesia Plan Comments:         Anesthesia Quick Evaluation

## 2014-06-22 NOTE — Progress Notes (Signed)
Patient ID: Kaitlin Fleming, female   DOB: 02/17/1961, 54 y.o.   MRN: [id number] Doing well. Spoke with family

## 2014-06-22 NOTE — Anesthesia Procedure Notes (Signed)
Procedure Name: Intubation Date/Time: 06/22/2014 4:40 PM Performed by: Dairl Ponder Pre-anesthesia Checklist: Patient identified, Emergency Drugs available, Timeout performed, Suction available and Patient being monitored Patient Re-evaluated:Patient Re-evaluated prior to inductionOxygen Delivery Method: Circle system utilized Preoxygenation: Pre-oxygenation with 100% oxygen Intubation Type: IV induction Ventilation: Mask ventilation without difficulty Laryngoscope Size: 3 Grade View: Grade I Tube type: Oral Number of attempts: 1 Placement Confirmation: ETT inserted through vocal cords under direct vision,  breath sounds checked- equal and bilateral and positive ETCO2 Secured at: 23 cm Tube secured with: Tape Dental Injury: Teeth and Oropharynx as per pre-operative assessment

## 2014-06-22 NOTE — Progress Notes (Signed)
ANTIBIOTIC CONSULT NOTE - INITIAL  Pharmacy Consult for Vancomycin Indication: surgical prophylaxis  Allergies  Allergen Reactions  . Plaquenil [Hydroxychloroquine Sulfate] Nausea And Vomiting and Other (See Comments)    GI UPSET  . Doxycycline Other (See Comments)    blisters  . Ibuprofen Hives  . Penicillins Other (See Comments)    Breaks out  . Sulfa Antibiotics Other (See Comments)    Breaks out    Patient Measurements: Weight: 174 lb (78.926 kg)  Vital Signs: Temp: 98.4 F (36.9 C) (02/09 1950) Temp Source: Oral (02/09 1141) BP: 123/78 mmHg (02/09 2145) Pulse Rate: 103 (02/09 1950) Intake/Output from previous day:   Intake/Output from this shift: Total I/O In: 250 [I.V.:250] Out: 125 [Urine:125]  Labs:  Recent Labs  06/21/14 1333  WBC 7.1  HGB 13.0  PLT 361  CREATININE 0.52   Estimated Creatinine Clearance: 86.9 mL/min (by C-G formula based on Cr of 0.52). No results for input(s): VANCOTROUGH, VANCOPEAK, VANCORANDOM, GENTTROUGH, GENTPEAK, GENTRANDOM, TOBRATROUGH, TOBRAPEAK, TOBRARND, AMIKACINPEAK, AMIKACINTROU, AMIKACIN in the last 72 hours.   Microbiology: Recent Results (from the past 720 hour(s))  Urine culture     Status: None   Collection Time: 05/26/14 12:50 PM  Result Value Ref Range Status   Specimen Description URINE, CATHETERIZED  Final   Special Requests NONE  Final   Colony Count   Final    >=100,000 COLONIES/ML Performed at [facility]    Culture   Final    KLEBSIELLA PNEUMONIAE Performed at [facility]    Report Status 05/28/2014 FINAL  Final   Organism ID, Bacteria KLEBSIELLA PNEUMONIAE  Final      Susceptibility   Klebsiella pneumoniae - MIC*    AMPICILLIN RESISTANT      CEFAZOLIN <=4 SENSITIVE Sensitive     CEFTRIAXONE <=1 SENSITIVE Sensitive     CIPROFLOXACIN <=0.25 SENSITIVE Sensitive     GENTAMICIN <=1 SENSITIVE Sensitive     LEVOFLOXACIN <=0.12 SENSITIVE Sensitive     NITROFURANTOIN <=16 SENSITIVE  Sensitive     TOBRAMYCIN <=1 SENSITIVE Sensitive     TRIMETH/SULFA <=20 SENSITIVE Sensitive     PIP/TAZO <=4 SENSITIVE Sensitive     * KLEBSIELLA PNEUMONIAE  Surgical pcr screen     Status: None   Collection Time: 06/21/14  1:33 PM  Result Value Ref Range Status   MRSA, PCR NEGATIVE NEGATIVE Final   Staphylococcus aureus NEGATIVE NEGATIVE Final    Comment:        The Xpert SA Assay (FDA approved for NASAL specimens in patients over 52 years of age), is one component of a comprehensive surveillance program.  Test performance has been validated by [facility] for patients greater than or equal to 83 year old. It is not intended to diagnose infection nor to guide or monitor treatment.     Medical History: Past Medical History  Diagnosis Date  . Complication of anesthesia     pt states after surgery in 2000 had to wear a heart monitor for 3 days.  Marland Kitchen PONV (postoperative nausea and vomiting)   . Peripheral edema     takes HCTZ daily   . Hyperlipidemia     takes Fenofibrate daily  . Pneumonia 79yrs ago    hx of  . History of bronchitis 7yrs ago  . Weakness     and tingling on left side  . Joint pain   . Joint swelling   . Chronic back pain     stenosis  .  GERD (gastroesophageal reflux disease)     takes OTC meds if needed  . Kidney stone     has one right now on the right side but not giving any problems  . History of blood transfusion     as a child  . Depression   . Chronic pain     takes Lexapro daily  . Rheumatoid arthritis   . Osteoarthritis   . DDD (degenerative disc disease)     Medications:  Prescriptions prior to admission  Medication Sig Dispense Refill Last Dose  . gabapentin (NEURONTIN) 300 MG capsule Take 300 mg by mouth 3 (three) times daily.    06/22/2014 at Unknown time  . hydrochlorothiazide (HYDRODIURIL) 25 MG tablet Take 25 mg by mouth daily.   06/21/2014 at Unknown time  . oxyCODONE-acetaminophen (PERCOCET) 10-325 MG per tablet Take 1 tablet by  mouth every 6 (six) hours as needed for pain.   06/22/2014 at Unknown time  . potassium chloride SA (K-DUR,KLOR-CON) 20 MEQ tablet Take 1 tablet (20 mEq total) by mouth daily. 4 tablet 0 06/21/2014 at Unknown time  . azithromycin (ZITHROMAX Z-PAK) 250 MG tablet Take 1 tablet (250 mg total) by mouth daily. 500mg  PO day 1, then 250mg  PO days 205 (Patient not taking: Reported on 06/18/2014) 6 tablet 0 05/25/2014 at Unknown time  . benzonatate (TESSALON) 100 MG capsule Take 1 capsule (100 mg total) by mouth every 8 (eight) hours. (Patient not taking: Reported on 06/18/2014) 21 capsule 0 05/25/2014 at Unknown time  . ciprofloxacin (CIPRO) 500 MG tablet Take 1 tablet (500 mg total) by mouth 2 (two) times daily. (Patient not taking: Reported on 06/18/2014) 6 tablet 0   . HYDROcodone-acetaminophen (NORCO) 5-325 MG per tablet Take 1-2 tablets by mouth every 4 (four) hours as needed. (Patient not taking: Reported on 05/26/2014) 10 tablet 0 05/05/2014 at Unknown time  . HYDROcodone-acetaminophen (NORCO/VICODIN) 5-325 MG per tablet Take 1 tablet by mouth every 6 (six) hours as needed for moderate pain or severe pain. (Patient not taking: Reported on 05/26/2014) 10 tablet 0   . HYDROmorphone (DILAUDID) 4 MG tablet Take one tab po q 4-6 hrs prn pain (Patient not taking: Reported on 06/18/2014) 12 tablet 0 Past Week at Unknown time  . ondansetron (ZOFRAN ODT) 4 MG disintegrating tablet Take 1 tablet (4 mg total) by mouth every 8 (eight) hours as needed for nausea or vomiting. (Patient not taking: Reported on 06/18/2014) 20 tablet 0   . phenazopyridine (PYRIDIUM) 200 MG tablet Take 1 tablet (200 mg total) by mouth 3 (three) times daily. (Patient not taking: Reported on 04/09/2014) 6 tablet 0   . traMADol (ULTRAM) 50 MG tablet Take 1 tablet (50 mg total) by mouth every 6 (six) hours as needed. (Patient not taking: Reported on 04/09/2014) 9 tablet 0    Scheduled:  . azithromycin  250 mg Oral Daily  . benzonatate  100 mg Oral Q8H  .  bupivacaine liposome  20 mL Infiltration Once  . diazepam      . gabapentin  300 mg Oral TID  . hydrochlorothiazide  25 mg Oral Daily  . HYDROmorphone      . HYDROmorphone      . HYDROmorphone      . morphine   Intravenous 6 times per day  . morphine   Intravenous 6 times per day  . phenazopyridine  200 mg Oral TID  . potassium chloride SA  20 mEq Oral Daily  . sodium chloride  3  mL Intravenous Q12H  . vancomycin       Infusions:  . sodium chloride    . sodium chloride    . lactated ringers 50 mL/hr at 06/22/14 1144   Assessment: 53yo female here for lumbar laminectomy/foraminotomy. Pharmacy is consulted to dose vancomycin for surgical prophylaxis 12 hours post-op. No drains are in place.  Goal of Therapy:  Prevention of infection  Plan:  Vancomycin 1000mg  IV one time 12h post-op  Pharmacy will sign off for now. Please re-consult if needed.  . Arlean Hopping, PharmD Clinical Pharmacist Pager [phone] 06/22/2014,10:31 PM

## 2014-06-22 NOTE — Anesthesia Postprocedure Evaluation (Signed)
  Anesthesia Post-op Note  Patient: Kaitlin Fleming  Procedure(s) Performed: Procedure(s) (LRB): Lumbar three-four Laminectomy/foraminotomy, with bigger screw at Right Lumbar four and redo of screws on left side,implantable bone stimulator (N/A)  Patient Location: PACU  Anesthesia Type: General  Level of Consciousness: awake and alert   Airway and Oxygen Therapy: Patient Spontanous Breathing  Post-op Pain: mild  Post-op Assessment: Post-op Vital signs reviewed, Patient's Cardiovascular Status Stable, Respiratory Function Stable, Patent Airway and No signs of Nausea or vomiting  Last Vitals:  Filed Vitals:   06/22/14 2024  BP:   Pulse:   Temp:   Resp: 14    Post-op Vital Signs: stable   Complications: No apparent anesthesia complications

## 2014-06-23 LAB — CBC
HCT: 29.2 % — ABNORMAL LOW (ref 36.0–46.0)
Hemoglobin: 9.1 g/dL — ABNORMAL LOW (ref 12.0–15.0)
MCH: 24.1 pg — ABNORMAL LOW (ref 26.0–34.0)
MCHC: 31.2 g/dL (ref 30.0–36.0)
MCV: 77.5 fL — ABNORMAL LOW (ref 78.0–100.0)
Platelets: 229 10*3/uL (ref 150–400)
RBC: 3.77 MIL/uL — ABNORMAL LOW (ref 3.87–5.11)
RDW: 15.5 % (ref 11.5–15.5)
WBC: 8.2 10*3/uL (ref 4.0–10.5)

## 2014-06-23 LAB — URINALYSIS, ROUTINE W REFLEX MICROSCOPIC
Bilirubin Urine: NEGATIVE
Glucose, UA: NEGATIVE mg/dL
Hgb urine dipstick: NEGATIVE
Ketones, ur: NEGATIVE mg/dL
Nitrite: POSITIVE — AB
Protein, ur: NEGATIVE mg/dL
Specific Gravity, Urine: 1.011 (ref 1.005–1.030)
Urobilinogen, UA: 1 mg/dL (ref 0.0–1.0)
pH: 5 (ref 5.0–8.0)

## 2014-06-23 LAB — URINE MICROSCOPIC-ADD ON

## 2014-06-23 MED ORDER — MORPHINE SULFATE (PF) 1 MG/ML IV SOLN
INTRAVENOUS | Status: AC
  Filled 2014-06-23: qty 25

## 2014-06-23 MED ORDER — OXYCODONE-ACETAMINOPHEN 5-325 MG PO TABS
ORAL_TABLET | ORAL | Status: AC
  Filled 2014-06-23: qty 1

## 2014-06-23 MED ORDER — DIAZEPAM 5 MG PO TABS
ORAL_TABLET | ORAL | Status: AC
  Filled 2014-06-23: qty 1

## 2014-06-23 MED ORDER — ONDANSETRON HCL 4 MG/2ML IJ SOLN
INTRAMUSCULAR | Status: AC
  Filled 2014-06-23: qty 2

## 2014-06-23 MED ORDER — OXYCODONE-ACETAMINOPHEN 5-325 MG PO TABS
ORAL_TABLET | ORAL | Status: AC
Start: 2014-06-23 — End: 2014-06-23
  Administered 2014-06-23: 1
  Filled 2014-06-23: qty 1

## 2014-06-23 MED ORDER — MORPHINE SULFATE (PF) 1 MG/ML IV SOLN
INTRAVENOUS | Status: AC
Start: 2014-06-23 — End: 2014-06-23
  Filled 2014-06-23: qty 25

## 2014-06-23 NOTE — Op Note (Signed)
NAMESHACONDA, HAJDUK NO.:  [id number]  MEDICAL RECORD NO.:  [id number]  LOCATION:  [facility]                         FACILITY:  [facility]  PHYSICIAN:  Hilda Lias, M.D.   DATE OF BIRTH:  04/02/1961  DATE OF PROCEDURE:  06/22/2014 DATE OF DISCHARGE:                              OPERATIVE REPORT   PREOPERATIVE DIAGNOSES:  Chronic radiculopathy.  Status post fusion at L4-L5 and L5-S1.  Persistent pain going to the left foot.  Question of pseudoarthrosis.  POSTOPERATIVE DIAGNOSES:  Chronic radiculopathy.  Status post fusion at L4-L5 and L5-S1.  Persistent pain going to the left foot.  Question of pseudoarthrosis.  PROCEDURE:  Exploration of the lumbar wound.  Replacement of the pedicle screws at the left L4-L5.  Laminotomy and foraminotomy of the left L3- L4.  Insertion of bone stimulator.  Lysis of adhesion.  SURGEON:  Hilda Lias, M.D.  ASSISTANT:  Dr. Conchita Paris.  CLINICAL HISTORY:  Kaitlin Fleming is a lady who had surgery on October because of degenerative disk disease.  The patient did well.  She went home after 48 hours, but then she started having some pain off and on in both feet especially on the left one.  Clinically, she has no weakness. The EMG nerve conduction test was negative.  An outpatient myelogram showed that the pedicle screws of left L4 was displaced laterally.  It was difficult to see if the nerve was compromised or not.  Nevertheless, because of persistence of the pain, we decided to go ahead with surgery. The myelogram showed that probably she was having known fusion because of lucency around the screws.  Husband, mother and daughter knew the risks and benefits of the surgery.  DESCRIPTION OF PROCEDURE:  The patient was taken to the OR, and after intubation, she was positioned on prone manner.  The back was cleaned with a Betadine and DuraPrep.  Midline incision resecting the previous scar was made and we went straight down to the lumbar  area.  Dissection was carried all the way laterally until we were able to see the right and left pedicles.  In the right side, we explored the area and we found that the pushing into the pedicle screws and the lateral aspect of the facet, it was solid.  There was no abnormal movement whatsoever.  The same procedure was done in the left side again with the same finding. The area was absolutely solid and every time moved, it moved as a block. Nevertheless, we decided to remove the pedicle screw of left L4, which was done after we removed the Capps of the 3 pedicles and the rod.  Re- exploration and ring section of the pedicle screws under the C-arm was done.  The Capps were pulled back in place with the rods.  Then, we explored the area of L3-L4.  We did a foraminotomy to decompress that area.  Having done this, we went laterally, we removed the periosteum lateral of the transverse process.  We introduced a bone stimulator with main feature on the knee in the subcutaneous space in the left side.  Then, a mix of BMP and Vitoss was used as well as autograft was used for  arthrodesis posterolaterally.  The area was irrigated.  Valsalva maneuver three times was negative.  The drain was left and the wound was closed with different layer of Vicryl and Steri- Strip.          ______________________________ Hilda Lias, M.D.     EB/MEDQ  D:  06/22/2014  T:  06/23/2014  Job:  [id number]

## 2014-06-23 NOTE — Evaluation (Signed)
Physical Therapy Evaluation Patient Details Name: Kaitlin Fleming MRN: [id number] DOB: 28-Jun-1960 Today's Date: 06/23/2014   History of Present Illness  Pt is a 54 yo female who underwent lumbar fusion from L4-S1 on 2/6 who then came to ER for L LE pain. Found to have displacement of L L$ screw laterally. Pt then underwent Exploration of the lumbar wound. Replacement of the pedicle. Pt also with h/o RA.  Clinical Impression  Patient is s/p above surgery resulting in the deficits listed below (see PT Problem List). Pt ambulation limited by nausea this date however anticipate pt with progress quickly. Patient will benefit from skilled PT to increase their independence and safety with mobility (while adhering to their precautions) to allow discharge to the venue listed below.     Follow Up Recommendations No PT follow up;Supervision/Assistance - 24 hour    Equipment Recommendations  None recommended by PT    Recommendations for Other Services       Precautions / Restrictions Precautions Precautions: Back Precaution Booklet Issued: Yes (comment) Precaution Comments: pt with verbal understanding Restrictions Weight Bearing Restrictions: No      Mobility  Bed Mobility Overal bed mobility: Needs Assistance Bed Mobility: Rolling;Sidelying to Sit Rolling: Modified independent (Device/Increase time) (with use of bedrail) Sidelying to sit: Min assist       General bed mobility comments: assist for trunk elevation  Transfers Overall transfer level: Needs assistance Equipment used: Rolling walker (2 wheeled) Transfers: Sit to/from Stand Sit to Stand: Min guard         General transfer comment: increased time, v/c's for hand placement otherwise safe  Ambulation/Gait Ambulation/Gait assistance: Min guard Ambulation Distance (Feet): 20 Feet Assistive device: Rolling walker (2 wheeled) Gait Pattern/deviations: Step-through pattern;Decreased stride length     General Gait  Details: pt declined further amb into hallway due to nausea   Stairs            Wheelchair Mobility    Modified Rankin (Stroke Patients Only)       Balance                                             Pertinent Vitals/Pain Pain Assessment: 0-10 Pain Score: 6  Pain Location: surgical site Pain Intervention(s): Monitored during session    Home Living Family/patient expects to be discharged to:: Private residence Living Arrangements: Spouse/significant other;Children Available Help at Discharge: Family;Available 24 hours/day Type of Home: House Home Access: Stairs to enter Entrance Stairs-Rails: Right;Left;Can reach both Entrance Stairs-Number of Steps: 5 Home Layout: One level Home Equipment: Shower seat;Grab bars - tub/shower;Grab bars - toilet;Hand held shower head;Cane - quad;Walker - 2 wheels      Prior Function Level of Independence: Independent with assistive device(s)         Comments: pt using DME from previous surgery     Hand Dominance   Dominant Hand: Right    Extremity/Trunk Assessment   Upper Extremity Assessment: Overall WFL for tasks assessed           Lower Extremity Assessment: Generalized weakness      Cervical / Trunk Assessment:  (recent surgery)  Communication      Cognition Arousal/Alertness: Awake/alert Behavior During Therapy: WFL for tasks assessed/performed Overall Cognitive Status: Within Functional Limits for tasks assessed  General Comments      Exercises        Assessment/Plan    PT Assessment Patient needs continued PT services  PT Diagnosis Difficulty walking;Acute pain   PT Problem List Decreased strength;Decreased range of motion;Decreased activity tolerance;Decreased balance;Decreased mobility  PT Treatment Interventions DME instruction;Gait training;Stair training;Therapeutic activities;Therapeutic exercise;Functional mobility training;Balance training    PT Goals (Current goals can be found in the Care Plan section) Acute Rehab PT Goals Patient Stated Goal: to get better and to not have any more surgeries PT Goal Formulation: With patient Time For Goal Achievement: 06/30/14 Potential to Achieve Goals: Good    Frequency Min 5X/week   Barriers to discharge        Co-evaluation               End of Session Equipment Utilized During Treatment: Gait belt Activity Tolerance: Patient tolerated treatment well Patient left: in chair;with call bell/phone within reach;with family/visitor present Nurse Communication: Mobility status         Time: 3220-2542 PT Time Calculation (min) (ACUTE ONLY): 20 min   Charges:   PT Evaluation $Initial PT Evaluation Tier I: 1 Procedure     PT G CodesMarcene Brawn 06/23/2014, 4:49 PM   Lewis Shock, PT, DPT Pager #: [phone] Office #: [phone]

## 2014-06-23 NOTE — Clinical Social Work Note (Signed)
Clinical Social Worker has acknowledged CSW consult for SNF placement. PT evaluation is completed and recommending no PT follow up.   Clinical Social Worker will sign off for now as social work intervention is no longer needed. Please consult [place] again if new need arises.  Derenda Fennel, MSW, LCSWA [phone] 06/23/2014 9:13 PM

## 2014-06-23 NOTE — Progress Notes (Signed)
Pt had a temp pf 103 at 2008, Dr Franky Macho (on call) paged and notified, ordered CBC, UA and blood cultures, tab tylenol 650mg  given at 2051, incentive spirometry given to pt, pt reassured, will however continue to monitor. [name], [name]

## 2014-06-24 NOTE — Progress Notes (Signed)
Physical Therapy Treatment Patient Details Name: Kaitlin Fleming MRN: [id number] DOB: July 19, 1960 Today's Date: 06/24/2014    History of Present Illness Pt is a 54 yo female who underwent lumbar fusion from L4-S1 on 2/6 who then came to ER for L LE pain. Found to have displacement of L L$ screw laterally. Pt then underwent Exploration of the lumbar wound. Replacement of the pedicle. Pt also with h/o RA.    PT Comments    Patient mobilizing well and adhering to back precautions. All questions answered. No further PT indicated and patient/daughter agree. PT is signing off.   Follow Up Recommendations  No PT follow up;Supervision for mobility/OOB     Equipment Recommendations  None recommended by PT    Recommendations for Other Services       Precautions / Restrictions Precautions Precautions: Back Precaution Booklet Issued: Yes (comment) Precaution Comments: pt able to verbalize; referred to handout when anwering questions to assure her she has the information available Restrictions Weight Bearing Restrictions: No    Mobility  Bed Mobility Overal bed mobility: Modified Independent Bed Mobility: Rolling;Sidelying to Sit;Sit to Sidelying Rolling: Modified independent (Device/Increase time) (with use of bedrail) Sidelying to sit: Modified independent (Device/Increase time)     Sit to sidelying: Modified independent (Device/Increase time)    Transfers Overall transfer level: Modified independent Equipment used: Rolling walker (2 wheeled) Transfers: Sit to/from Stand Sit to Stand: Modified independent (Device/Increase time)            Ambulation/Gait Ambulation/Gait assistance: Supervision Ambulation Distance (Feet): 175 Feet Assistive device: Rolling walker (2 wheeled) Gait Pattern/deviations: Step-through pattern;Decreased stride length Gait velocity: decr       Stairs Stairs:  (pt deferred "they are no problem")          Wheelchair Mobility     Modified Rankin (Stroke Patients Only)       Balance                                    Cognition Arousal/Alertness: Awake/alert Behavior During Therapy: WFL for tasks assessed/performed Overall Cognitive Status: Within Functional Limits for tasks assessed                      Exercises      General Comments General comments (skin integrity, edema, etc.): Pt reported she has a very tall bed and has been using a 3 step ladder to get into bed. She has it beside bed with steps perpendicular to bed, steps onto first step and with feet remaining on step (parallel to mattress) she twists her hips around to sit on bed. She reports she felt a POP in her back when she was rolling/sliding off mattress to get OOB. Discussed sleeping on her couch as an alternative. Recommended filling any "low spots" with blanket/towel. Discussed option of bringing in another bed and pt not interested. Feels sleeping on the couch with her back against the back of the couch will be best      Pertinent Vitals/Pain Pain Assessment: 0-10 Pain Score: 3  Pain Location: back Pain Intervention(s): Limited activity within patient's tolerance;Monitored during session;Repositioned    Home Living                      Prior Function            PT Goals (current goals can now be found in the care  plan section) Acute Rehab PT Goals Patient Stated Goal: to get better and to not have any more surgeries Progress towards PT goals: Goals met/education completed, patient discharged from PT    Frequency       PT Plan Current plan remains appropriate    Co-evaluation             End of Session Equipment Utilized During Treatment: Back brace Activity Tolerance: Patient tolerated treatment well Patient left: with call bell/phone within reach;with family/visitor present;in bed;with bed alarm set     Time: 0298-4730 PT Time Calculation (min) (ACUTE ONLY): 26 min  Charges:   $Gait Training: 8-22 mins $Therapeutic Activity: 8-22 mins                    G Codes:      Kaitlin Fleming 2014-06-28, 10:30 AM Pager [phone]

## 2014-06-24 NOTE — Plan of Care (Signed)
Problem: Acute Rehab PT Goals(only PT should resolve) Goal: Pt Will Go Up/Down Stairs Outcome: Not Met (add Reason) Pt deferred due to prior education and feels confident on stairs. Daughter agreed

## 2014-06-24 NOTE — Progress Notes (Signed)
UR complete.  Coal [place] RN, MSN 

## 2014-06-24 NOTE — Progress Notes (Signed)
Physical medicine and rehabilitation consult requested chart reviewed. Physical therapy evaluation completed 06/23/2014 no further recommended follow-up regarding therapy at this time. Recommendations are home versus skilled nursing facility. Hold on formal rehabilitation consult at this time

## 2014-06-24 NOTE — Progress Notes (Signed)
Occupational Therapy Treatment Patient Details Name: Monseratt Ledin MRN: [id number] DOB: 1960/11/14 Today's Date: 06/24/2014    History of present illness Pt is a 54 yo female who underwent lumbar fusion from L4-S1 on 2/6 who then came to ER for L LE pain. Found to have displacement of L L$ screw laterally. Pt then underwent Exploration of the lumbar wound. Replacement of the pedicle. Pt also with h/o RA.   OT comments  Patient evaluated by Occupational Therapy with no further acute OT needs identified. All education has been completed and the patient has no further questions. Pt is able to perform BADLs with supervision.  Reviewed back preacautions and safety with BADLs.  Daughter able to assist pt as needed.  See below for any follow-up Occupational Therapy or equipment needs. OT is signing off. Thank you for this referral.    Follow Up Recommendations  No OT follow up;Supervision/Assistance - 24 hour    Equipment Recommendations  None recommended by OT    Recommendations for Other Services      Precautions / Restrictions Precautions Precautions: Back Precaution Comments: Pt able to state back        Mobility Bed Mobility                  Transfers Overall transfer level: Modified independent                    Balance                                   ADL Overall ADL's : Needs assistance/impaired Eating/Feeding: Independent                   Lower Body Dressing: Supervision/safety;Sit to/from stand Lower Body Dressing Details (indicate cue type and reason): pt able to cross ankles over knees Toilet Transfer: Supervision/safety;Ambulation;RW;Comfort height toilet   Toileting- Clothing Manipulation and Hygiene: Supervision/safety;Sit to/from stand       Functional mobility during ADLs: Supervision/safety;Rolling walker General ADL Comments: Pt able to state back precautions.  SHe is able to cross ankles over knees to don/doff  socks.  Instructed her on safe techniques for LB ADLs, avoiding bending with grooming.  Pt reports her daughter will assist her as needed.  Daughter confirmed and does not work.       Vision                     Perception     Praxis      Cognition   Behavior During Therapy: WFL for tasks assessed/performed Overall Cognitive Status: Within Functional Limits for tasks assessed                       Extremity/Trunk Assessment  Upper Extremity Assessment Upper Extremity Assessment: Overall WFL for tasks assessed   Lower Extremity Assessment Lower Extremity Assessment: Defer to PT evaluation        Exercises     Shoulder Instructions       General Comments      Pertinent Vitals/ Pain       Pain Assessment: 0-10 Pain Score: 3  Pain Location: back  Pain Descriptors / Indicators: Aching Pain Intervention(s): Limited activity within patient's tolerance  Home Living Family/patient expects to be discharged to:: Private residence Living Arrangements: Spouse/significant other;Children Available Help at Discharge: Family;Available 24 hours/day Type of Home: House Home Access: Stairs  to enter Entrance Stairs-Number of Steps: 5 Entrance Stairs-Rails: Right;Left;Can reach both Home Layout: One level     Bathroom Shower/Tub: Walk-in shower;Door   [employer] Toilet: Handicapped height Bathroom Accessibility: Yes How Accessible: Accessible via walker Home Equipment: Shower seat;Grab bars - tub/shower;Grab bars - toilet;Hand held shower head;Cane - quad;Walker - 2 wheels          Prior Functioning/Environment Level of Independence: Independent with assistive device(s)        Comments: pt using DME from previous surgery   Frequency       Progress Toward Goals  OT Goals(current goals can now be found in the care plan section)     Acute Rehab OT Goals OT Goal Formulation: All assessment and education complete, DC therapy  Plan       Co-evaluation                 End of Session Equipment Utilized During Treatment: Rolling walker;Back brace   Activity Tolerance Patient tolerated treatment well   Patient Left Other (comment) (with daughter in bathroom )   Nurse Communication Mobility status        Time: 5102-5852 OT Time Calculation (min): 14 min  Charges: OT General Charges $OT Visit: 1 Procedure OT Evaluation $Initial OT Evaluation Tier I: 1 Procedure  Zakaria Sedor, Ursula Alert M 06/24/2014, 5:33 PM

## 2014-06-24 NOTE — Progress Notes (Signed)
Patient ID: Kaitlin Fleming, female   DOB: Dec 29, 1960, 54 y.o.   MRN: [id number] fweeling better, no leg pain. Asking when to go home

## 2014-06-25 MED ORDER — HYDROMORPHONE HCL 2 MG PO TABS
4.0000 mg | ORAL_TABLET | ORAL | Status: DC | PRN
Start: 2014-06-25 — End: 2014-06-26
  Administered 2014-06-25 – 2014-06-26 (×5): 4 mg via ORAL
  Filled 2014-06-25 (×5): qty 2

## 2014-06-25 NOTE — Progress Notes (Signed)
Patient ID: Kaitlin Fleming, female   DOB: July 06, 1960, 54 y.o.   MRN: [id number] C/o incisional pain, no leg pain or weakness wound dry

## 2014-06-26 NOTE — Progress Notes (Signed)
Pt d/c to home by car with family. Assessment stable. Prescriptions given to pt by MD. Pt verbalizes understanding of d/c instructions.

## 2014-06-26 NOTE — Discharge Summary (Signed)
Physician Discharge Summary  Patient ID: Kaitlin Fleming MRN: [id number] DOB/AGE: 09-02-1960 54 y.o.  Admit date: 06/22/2014 Discharge date: 06/26/2014  Admission Diagnoses:lumbar radiculopathy  Discharge Diagnoses:  Active Problems:   Lumbar radiculopathy, chronic   Discharged Condition: no lef pain or weakness  Hospital Course: surgery  Consults:rehabilitation medicine  Significant Diagnostic Studies: myelogram  Treatments: revision of fusion, left l34 laminotomy  Discharge Exam: Blood pressure 119/62, pulse 96, temperature 99.3 F (37.4 C), temperature source Oral, resp. rate 18, height 5\' 7"  (1.702 m), weight 78.9 kg (173 lb 15.1 oz), SpO2 98 %. Ambulating, no weakness  Disposition: to see me in 10 days     Medication List    ASK your doctor about these medications        azithromycin 250 MG tablet  Commonly known as:  ZITHROMAX Z-PAK  Take 1 tablet (250 mg total) by mouth daily. 500mg  PO day 1, then 250mg  PO days 205     benzonatate 100 MG capsule  Commonly known as:  TESSALON  Take 1 capsule (100 mg total) by mouth every 8 (eight) hours.     ciprofloxacin 500 MG tablet  Commonly known as:  CIPRO  Take 1 tablet (500 mg total) by mouth 2 (two) times daily.     gabapentin 300 MG capsule  Commonly known as:  NEURONTIN  Take 300 mg by mouth 3 (three) times daily.     hydrochlorothiazide 25 MG tablet  Commonly known as:  HYDRODIURIL  Take 25 mg by mouth daily.     HYDROcodone-acetaminophen 5-325 MG per tablet  Commonly known as:  NORCO  Take 1-2 tablets by mouth every 4 (four) hours as needed.     HYDROcodone-acetaminophen 5-325 MG per tablet  Commonly known as:  NORCO/VICODIN  Take 1 tablet by mouth every 6 (six) hours as needed for moderate pain or severe pain.     HYDROmorphone 4 MG tablet  Commonly known as:  DILAUDID  Take one tab po q 4-6 hrs prn pain     ondansetron 4 MG disintegrating tablet  Commonly known as:  ZOFRAN ODT  Take 1 tablet (4  mg total) by mouth every 8 (eight) hours as needed for nausea or vomiting.     oxyCODONE-acetaminophen 10-325 MG per tablet  Commonly known as:  PERCOCET  Take 1 tablet by mouth every 6 (six) hours as needed for pain.     phenazopyridine 200 MG tablet  Commonly known as:  PYRIDIUM  Take 1 tablet (200 mg total) by mouth 3 (three) times daily.     potassium chloride SA 20 MEQ tablet  Commonly known as:  K-DUR,KLOR-CON  Take 1 tablet (20 mEq total) by mouth daily.     traMADol 50 MG tablet  Commonly known as:  ULTRAM  Take 1 tablet (50 mg total) by mouth every 6 (six) hours as needed.         Signed: 06/26/2014, 9:42 AM

## 2014-06-30 LAB — CULTURE, BLOOD (ROUTINE X 2)
Culture: NO GROWTH
Culture: NO GROWTH

## 2014-07-19 ENCOUNTER — Emergency Department (HOSPITAL_COMMUNITY)
Admission: EM | Admit: 2014-07-19 | Discharge: 2014-07-19 | Disposition: A | Discharge status: Home / Self Care | Payer: BLUE CROSS/BLUE SHIELD | Attending: Emergency Medicine | Admitting: Emergency Medicine

## 2014-07-19 ENCOUNTER — Encounter (HOSPITAL_COMMUNITY): Payer: Self-pay | Admitting: Emergency Medicine

## 2014-07-19 DIAGNOSIS — Z9889 Other specified postprocedural states: Secondary | ICD-10-CM | POA: Insufficient documentation

## 2014-07-19 DIAGNOSIS — Z792 Long term (current) use of antibiotics: Secondary | ICD-10-CM | POA: Insufficient documentation

## 2014-07-19 DIAGNOSIS — R630 Anorexia: Secondary | ICD-10-CM | POA: Insufficient documentation

## 2014-07-19 DIAGNOSIS — G8929 Other chronic pain: Secondary | ICD-10-CM

## 2014-07-19 DIAGNOSIS — F329 Major depressive disorder, single episode, unspecified: Secondary | ICD-10-CM | POA: Insufficient documentation

## 2014-07-19 DIAGNOSIS — Z8701 Personal history of pneumonia (recurrent): Secondary | ICD-10-CM | POA: Insufficient documentation

## 2014-07-19 DIAGNOSIS — Z8719 Personal history of other diseases of the digestive system: Secondary | ICD-10-CM | POA: Insufficient documentation

## 2014-07-19 DIAGNOSIS — M549 Dorsalgia, unspecified: Secondary | ICD-10-CM | POA: Diagnosis present

## 2014-07-19 DIAGNOSIS — Z87442 Personal history of urinary calculi: Secondary | ICD-10-CM | POA: Diagnosis not present

## 2014-07-19 DIAGNOSIS — M1990 Unspecified osteoarthritis, unspecified site: Secondary | ICD-10-CM | POA: Insufficient documentation

## 2014-07-19 DIAGNOSIS — Z8639 Personal history of other endocrine, nutritional and metabolic disease: Secondary | ICD-10-CM | POA: Diagnosis not present

## 2014-07-19 DIAGNOSIS — Z880 Allergy status to penicillin: Secondary | ICD-10-CM | POA: Diagnosis not present

## 2014-07-19 DIAGNOSIS — Z8709 Personal history of other diseases of the respiratory system: Secondary | ICD-10-CM | POA: Diagnosis not present

## 2014-07-19 DIAGNOSIS — Z79899 Other long term (current) drug therapy: Secondary | ICD-10-CM | POA: Diagnosis not present

## 2014-07-19 LAB — CBC WITH DIFFERENTIAL/PLATELET
Basophils Absolute: 0 10*3/uL (ref 0.0–0.1)
Basophils Relative: 0 % (ref 0–1)
Eosinophils Absolute: 0.2 10*3/uL (ref 0.0–0.7)
Eosinophils Relative: 3 % (ref 0–5)
HCT: 34.9 % — ABNORMAL LOW (ref 36.0–46.0)
Hemoglobin: 10.5 g/dL — ABNORMAL LOW (ref 12.0–15.0)
Lymphocytes Relative: 18 % (ref 12–46)
Lymphs Abs: 0.9 10*3/uL (ref 0.7–4.0)
MCH: 22.5 pg — ABNORMAL LOW (ref 26.0–34.0)
MCHC: 30.1 g/dL (ref 30.0–36.0)
MCV: 74.9 fL — ABNORMAL LOW (ref 78.0–100.0)
Monocytes Absolute: 0.4 10*3/uL (ref 0.1–1.0)
Monocytes Relative: 9 % (ref 3–12)
Neutro Abs: 3.3 10*3/uL (ref 1.7–7.7)
Neutrophils Relative %: 69 % (ref 43–77)
Platelets: 296 10*3/uL (ref 150–400)
RBC: 4.66 MIL/uL (ref 3.87–5.11)
RDW: 15.8 % — ABNORMAL HIGH (ref 11.5–15.5)
WBC: 4.8 10*3/uL (ref 4.0–10.5)

## 2014-07-19 LAB — BASIC METABOLIC PANEL
Anion gap: 8 (ref 5–15)
BUN: 4 mg/dL — ABNORMAL LOW (ref 6–23)
CO2: 26 mmol/L (ref 19–32)
Calcium: 9.1 mg/dL (ref 8.4–10.5)
Chloride: 104 mmol/L (ref 96–112)
Creatinine, Ser: 0.52 mg/dL (ref 0.50–1.10)
GFR calc Af Amer: >90 mL/min (ref >90)
GFR calc non Af Amer: >90 mL/min (ref >90)
Glucose, Bld: 109 mg/dL — ABNORMAL HIGH (ref 70–99)
Potassium: 3.6 mmol/L (ref 3.5–5.1)
Sodium: 138 mmol/L (ref 135–145)

## 2014-07-19 LAB — URINALYSIS, ROUTINE W REFLEX MICROSCOPIC
Glucose, UA: NEGATIVE mg/dL
Hgb urine dipstick: NEGATIVE
Ketones, ur: NEGATIVE mg/dL
Leukocytes, UA: NEGATIVE
Nitrite: NEGATIVE
Protein, ur: NEGATIVE mg/dL
Specific Gravity, Urine: >1.03 — ABNORMAL HIGH (ref 1.005–1.030)
Urobilinogen, UA: 0.2 mg/dL (ref 0.0–1.0)
pH: 6 (ref 5.0–8.0)

## 2014-07-19 MED ORDER — SODIUM CHLORIDE 0.9 % IV BOLUS (SEPSIS)
1000.0000 mL | Freq: Once | INTRAVENOUS | Status: AC
  Administered 2014-07-19: 1000 mL via INTRAVENOUS

## 2014-07-19 MED ORDER — HYDROMORPHONE HCL 1 MG/ML IJ SOLN
1.0000 mg | Freq: Once | INTRAMUSCULAR | Status: AC
  Administered 2014-07-19: 1 mg via INTRAVENOUS
  Filled 2014-07-19: qty 1

## 2014-07-19 MED ORDER — ONDANSETRON HCL 8 MG PO TABS: 8.0000 mg | ORAL_TABLET | Freq: Three times a day (TID) | ORAL | Status: DC | PRN

## 2014-07-19 MED ORDER — ONDANSETRON HCL 4 MG/2ML IJ SOLN
4.0000 mg | Freq: Once | INTRAMUSCULAR | Status: AC
  Administered 2014-07-19: 4 mg via INTRAMUSCULAR
  Filled 2014-07-19: qty 2

## 2014-07-19 NOTE — ED Notes (Signed)
Pt reports generalized weakness and back pain since back surgery x1 month ago. Pt reports history of several back surgeries recently and reports last surgery "a stimulator was placed."

## 2014-07-19 NOTE — Discharge Instructions (Signed)
Chronic Back Pain  When back pain lasts longer than 3 months, it is called chronic back pain.People with chronic back pain often go through certain periods that are more intense (flare-ups).  CAUSES Chronic back pain can be caused by wear and tear (degeneration) on different structures in your back. These structures include:  The bones of your spine (vertebrae) and the joints surrounding your spinal cord and nerve roots (facets).  The strong, fibrous tissues that connect your vertebrae (ligaments). Degeneration of these structures may result in pressure on your nerves. This can lead to constant pain. HOME CARE INSTRUCTIONS  Avoid bending, heavy lifting, prolonged sitting, and activities which make the problem worse.  Take brief periods of rest throughout the day to reduce your pain. Lying down or standing usually is better than sitting while you are resting.  Take over-the-counter or prescription medicines only as directed by your caregiver. SEEK IMMEDIATE MEDICAL CARE IF:   You have weakness or numbness in one of your legs or feet.  You have trouble controlling your bladder or bowels.  You have nausea, vomiting, abdominal pain, shortness of breath, or fainting. Document Released: 06/07/2004 Document Revised: 07/23/2011 Document Reviewed: 04/14/2011 [facility] Patient Information 2015 [place], [place]. This information is not intended to replace advice given to you by your health care provider. Make sure you discuss any questions you have with your health care provider.   Stop taking your bactrim as this may be the cause of your nausea.  You may use the medicine prescribed if needed for nausea - I suspect this will resolve once you stop taking the antibiotic. Your urinary infection is gone based on your urine test today.  Follow up with Dr. Jeral Fruit as planned.

## 2014-07-19 NOTE — ED Notes (Signed)
Pt. Unable to obtain urine at present. To try after getting IV fluids.

## 2014-07-20 ENCOUNTER — Encounter (HOSPITAL_COMMUNITY): Payer: Self-pay | Admitting: Neurosurgery

## 2014-07-20 NOTE — OR Nursing (Signed)
Late entry, delay code documentation. 

## 2014-07-21 NOTE — ED Provider Notes (Signed)
CSN: [id number]     Arrival date & time 07/19/14  1660 History   First MD Initiated Contact with Patient 07/19/14 0913     Chief Complaint  Patient presents with  . Back Pain     (Consider location/radiation/quality/duration/timing/severity/associated sxs/prior Treatment) The history is provided by the patient and the spouse.   Kaitlin Fleming is a 54 y.o. female with a past medical history of chronic back pain associated with rheumatoid arthritis, but also with knonw ddd and now one month out from her 3rd lumbar surgery by Dr. Jeral Fruit , first a laminectomy, the lumbar fusion, most recently for placement of  Bone stimulator secondary to persistent pain presenting with persistent ongoing low back pain and generalized weakness.  She was seen by Dr. Jeral Fruit last week at which time he wanted to give her last surgery a few more weeks to heal, but in the interim is arranging rehab for her.  Pt is resistant to this because she lives too far aware and doesn't want to travel for this.  She reports generalized fatigue and loss of appetite, stating I don't want to eat or do anything because of my back pain.  She denies fevers, chills, focal weakness, numbness in her legs, urinary of fecal incontinence or retention, abdominal pain, vomiting, diarrhea, dysuria, cough, sob, chest pain. She reports her pain medicine makes her nauseated.        Past Medical History  Diagnosis Date  . Complication of anesthesia     pt states after surgery in 2000 had to wear a heart monitor for 3 days.  Marland Kitchen PONV (postoperative nausea and vomiting)   . Peripheral edema     takes HCTZ daily   . Hyperlipidemia     takes Fenofibrate daily  . Pneumonia 68yrs ago    hx of  . History of bronchitis 93yrs ago  . Weakness     and tingling on left side  . Joint pain   . Joint swelling   . Chronic back pain     stenosis  . GERD (gastroesophageal reflux disease)     takes OTC meds if needed  . Kidney stone     has one right now on  the right side but not giving any problems  . History of blood transfusion     as a child  . Depression   . Chronic pain     takes Lexapro daily  . Rheumatoid arthritis   . Osteoarthritis   . DDD (degenerative disc disease)    Past Surgical History  Procedure Laterality Date  . Abdominal hysterectomy  2000  . Tubal ligation  24 yrs ago  . Lumbar laminectomy/decompression microdiscectomy Left 12/08/2013    Procedure: LEFT LUMBAR FOUR-FIVEW, LUMBAR FIVE-SACRAL ONE LAMINECTOMY;  Surgeon: Karn Cassis, MD;  Location: [facility];  Service: Neurosurgery;  Laterality: Left;  . Back surgery  03/10/14    lumbar fusion   History reviewed. No pertinent family history. History  Substance Use Topics  . Smoking status: Never Smoker   . Smokeless tobacco: Never Used  . Alcohol Use: No   OB History    No data available     Review of Systems  Constitutional: Positive for activity change and appetite change. Negative for fever.  HENT: Negative.   Respiratory: Negative for cough and shortness of breath.   Cardiovascular: Negative for chest pain and leg swelling.  Gastrointestinal: Negative for abdominal pain, constipation and abdominal distention.  Genitourinary: Negative for dysuria, urgency,  frequency, flank pain and difficulty urinating.  Musculoskeletal: Positive for back pain. Negative for joint swelling and gait problem.  Skin: Negative for color change, rash and wound.  Neurological: Negative for weakness and numbness.      Allergies  Plaquenil; Doxycycline; Ibuprofen; Penicillins; and Sulfa antibiotics  Home Medications   Prior to Admission medications   Medication Sig Start Date End Date Taking? Authorizing Provider  diazepam (VALIUM) 5 MG tablet Take 5 mg by mouth every 6 (six) hours as needed for muscle spasms.   Yes Historical Provider, MD  gabapentin (NEURONTIN) 300 MG capsule Take 300 mg by mouth 3 (three) times daily.    Yes Historical Provider, MD   hydrochlorothiazide (HYDRODIURIL) 25 MG tablet Take 25 mg by mouth daily.   Yes Historical Provider, MD  HYDROmorphone (DILAUDID) 4 MG tablet Take one tab po q 4-6 hrs prn pain 03/26/14  Yes Tammi Triplett, PA-C  potassium chloride SA (K-DUR,KLOR-CON) 20 MEQ tablet Take 1 tablet (20 mEq total) by mouth daily. 05/11/14  Yes Lula Olszewski, MD  sulfamethoxazole-trimethoprim (BACTRIM DS,SEPTRA DS) 800-160 MG per tablet Take 1 tablet by mouth 2 (two) times daily. For 10 days, starting 2/29   Yes Historical Provider, MD  benzonatate (TESSALON) 100 MG capsule Take 1 capsule (100 mg total) by mouth every 8 (eight) hours. Patient not taking: Reported on 06/18/2014 05/25/14   Eber Hong, MD  HYDROcodone-acetaminophen [facility]) 5-325 MG per tablet Take 1-2 tablets by mouth every 4 (four) hours as needed. Patient not taking: Reported on 05/26/2014 05/03/14   Blane Ohara, MD  ondansetron (ZOFRAN ODT) 4 MG disintegrating tablet Take 1 tablet (4 mg total) by mouth every 8 (eight) hours as needed for nausea or vomiting. Patient not taking: Reported on 06/18/2014 05/26/14   Teressa Lower, NP  ondansetron (ZOFRAN) 8 MG tablet Take 1 tablet (8 mg total) by mouth every 8 (eight) hours as needed for nausea. 07/19/14   Burgess Amor, PA-C  phenazopyridine (PYRIDIUM) 200 MG tablet Take 1 tablet (200 mg total) by mouth 3 (three) times daily. Patient not taking: Reported on 04/09/2014 03/29/14   April Palumbo, MD   BP 118/74 mmHg  Pulse 106  Temp(Src) 98.1 F (36.7 C) (Oral)  Resp 16  Ht 5\' 7"  (1.702 m)  Wt 170 lb (77.111 kg)  BMI 26.62 kg/m2  SpO2 100% Physical Exam  Constitutional: She appears well-developed and well-nourished.  HENT:  Head: Normocephalic and atraumatic.  Mouth/Throat: Oropharynx is clear and moist. No oropharyngeal exudate.  Eyes: Conjunctivae are normal.  Neck: Normal range of motion.  Cardiovascular: Normal rate, regular rhythm, normal heart sounds and intact distal pulses.   Pulmonary/Chest:  Effort normal and breath sounds normal. She has no wheezes.  Abdominal: Soft. Bowel sounds are normal. She exhibits no distension. There is no tenderness. There is no rebound and no guarding.  Musculoskeletal: Normal range of motion. She exhibits no edema or tenderness.       Lumbar back: She exhibits no tenderness, no bony tenderness, no swelling and no edema.  Well healed lumbar midline surgical incision.  Neurological: She is alert.  Skin: Skin is warm and dry.  Psychiatric: She has a normal mood and affect.  Nursing note and vitals reviewed.   ED Course  Procedures (including critical care time) Labs Review Labs Reviewed  URINALYSIS, ROUTINE W REFLEX MICROSCOPIC - Abnormal; Notable for the following:    Specific Gravity, Urine >1.030 (*)    Bilirubin Urine SMALL (*)    All other  components within normal limits  CBC WITH DIFFERENTIAL/PLATELET - Abnormal; Notable for the following:    Hemoglobin 10.5 (*)    HCT 34.9 (*)    MCV 74.9 (*)    MCH 22.5 (*)    RDW 15.8 (*)    All other components within normal limits  BASIC METABOLIC PANEL - Abnormal; Notable for the following:    Glucose, Bld 109 (*)    BUN 4 (*)    All other components within normal limits    Imaging Review No results found.   EKG Interpretation None      MDM   Final diagnoses:  Chronic back pain    Patients labs and/or radiological studies were reviewed and considered during the medical decision making and disposition process.  Results were also discussed with patient. Pt was given IV fluids, also tolerated PO fluids without difficulty. No acute findings on exam in per labs, urine is concentrated without ketonuria - encouraged increased fluid intake - prescribed zofran to help with nausea she has when she takes hydrocodone.  She does have mild anemia suggesting iron deficiency,  microcytic.  Advised iron supplement, f/u with pcp and f/u with Dr. Jeral Fruit as planned (in 2 weeks), calling sooner for any  worsened sx.    Burgess Amor, PA-C 07/21/14 1424  Benjiman Core, MD 07/21/14 [phone]

## 2015-02-13 ENCOUNTER — Emergency Department (HOSPITAL_COMMUNITY): Payer: BLUE CROSS/BLUE SHIELD

## 2015-02-13 ENCOUNTER — Encounter (HOSPITAL_COMMUNITY): Payer: Self-pay | Admitting: Nurse Practitioner

## 2015-02-13 ENCOUNTER — Emergency Department (HOSPITAL_COMMUNITY)
Admission: EM | Admit: 2015-02-13 | Discharge: 2015-02-13 | Disposition: A | Discharge status: Home / Self Care | Payer: BLUE CROSS/BLUE SHIELD | Attending: Emergency Medicine | Admitting: Emergency Medicine

## 2015-02-13 DIAGNOSIS — S32010A Wedge compression fracture of first lumbar vertebra, initial encounter for closed fracture: Secondary | ICD-10-CM | POA: Diagnosis not present

## 2015-02-13 DIAGNOSIS — W19XXXA Unspecified fall, initial encounter: Secondary | ICD-10-CM

## 2015-02-13 DIAGNOSIS — G8929 Other chronic pain: Secondary | ICD-10-CM | POA: Insufficient documentation

## 2015-02-13 DIAGNOSIS — Z8701 Personal history of pneumonia (recurrent): Secondary | ICD-10-CM | POA: Insufficient documentation

## 2015-02-13 DIAGNOSIS — K219 Gastro-esophageal reflux disease without esophagitis: Secondary | ICD-10-CM | POA: Insufficient documentation

## 2015-02-13 DIAGNOSIS — E785 Hyperlipidemia, unspecified: Secondary | ICD-10-CM | POA: Diagnosis not present

## 2015-02-13 DIAGNOSIS — X58XXXA Exposure to other specified factors, initial encounter: Secondary | ICD-10-CM | POA: Diagnosis not present

## 2015-02-13 DIAGNOSIS — S3992XA Unspecified injury of lower back, initial encounter: Secondary | ICD-10-CM | POA: Diagnosis present

## 2015-02-13 DIAGNOSIS — Y998 Other external cause status: Secondary | ICD-10-CM | POA: Insufficient documentation

## 2015-02-13 DIAGNOSIS — Z79899 Other long term (current) drug therapy: Secondary | ICD-10-CM | POA: Diagnosis not present

## 2015-02-13 DIAGNOSIS — Y9389 Activity, other specified: Secondary | ICD-10-CM | POA: Insufficient documentation

## 2015-02-13 DIAGNOSIS — S32000A Wedge compression fracture of unspecified lumbar vertebra, initial encounter for closed fracture: Secondary | ICD-10-CM

## 2015-02-13 DIAGNOSIS — Y9289 Other specified places as the place of occurrence of the external cause: Secondary | ICD-10-CM | POA: Insufficient documentation

## 2015-02-13 DIAGNOSIS — Z880 Allergy status to penicillin: Secondary | ICD-10-CM | POA: Insufficient documentation

## 2015-02-13 MED ORDER — OXYCODONE-ACETAMINOPHEN 10-325 MG PO TABS: 1.0000 | ORAL_TABLET | ORAL | Status: DC | PRN

## 2015-02-13 MED ORDER — MORPHINE SULFATE (PF) 4 MG/ML IV SOLN
6.0000 mg | Freq: Once | INTRAVENOUS | Status: AC
  Administered 2015-02-13: 6 mg via INTRAMUSCULAR
  Filled 2015-02-13: qty 2

## 2015-02-13 NOTE — Discharge Instructions (Signed)
Follow up with the neurosurgeon as discussed

## 2015-02-13 NOTE — ED Notes (Signed)
she slipped on wet grass onto her buttocks and heard a pop and has had severe lower back pain since. She has a history of several back surgeries and chronic back pain. She denies bladder/bowel changes. She is ambulatory, mae.

## 2015-02-13 NOTE — ED Notes (Signed)
Pt c/o the pain not helping her pain

## 2015-02-13 NOTE — ED Notes (Signed)
Patient transported to X-ray 

## 2015-02-13 NOTE — ED Provider Notes (Signed)
CSN: [id number]     Arrival date & time 02/13/15  1333 History   First MD Initiated Contact with Patient 02/13/15 1352     Chief Complaint  Patient presents with  . Fall     (Consider location/radiation/quality/duration/timing/severity/associated sxs/prior Treatment) HPI Comments: Pt states that she fell and landed on her but and now she is having pain in her lower back. She has had multiple back surgeries. She states that she has numbness in feet bilaterally but this has been going since her last surgery in February. She is on endocet for chronic pain. She denies loc or dizziness associated with the fall  Patient is a 54 y.o. female presenting with fall. The history is provided by the patient. No language interpreter was used.  Fall This is a new problem. The current episode started today. The problem occurs constantly. The problem has been unchanged. Pertinent negatives include no fever, neck pain, vomiting or weakness. She has tried nothing for the symptoms.    Past Medical History  Diagnosis Date  . Complication of anesthesia     pt states after surgery in 2000 had to wear a heart monitor for 3 days.  Marland Kitchen PONV (postoperative nausea and vomiting)   . Peripheral edema     takes HCTZ daily   . Hyperlipidemia     takes Fenofibrate daily  . Pneumonia 3yrs ago    hx of  . History of bronchitis 33yrs ago  . Weakness     and tingling on left side  . Joint pain   . Joint swelling   . Chronic back pain     stenosis  . GERD (gastroesophageal reflux disease)     takes OTC meds if needed  . Kidney stone     has one right now on the right side but not giving any problems  . History of blood transfusion     as a child  . Depression   . Chronic pain     takes Lexapro daily  . Rheumatoid arthritis (HCC)   . Osteoarthritis   . DDD (degenerative disc disease)    Past Surgical History  Procedure Laterality Date  . Abdominal hysterectomy  2000  . Tubal ligation  24 yrs ago  . Lumbar  laminectomy/decompression microdiscectomy Left 12/08/2013    Procedure: LEFT LUMBAR FOUR-FIVEW, LUMBAR FIVE-SACRAL ONE LAMINECTOMY;  Surgeon: Karn Cassis, MD;  Location: [facility];  Service: Neurosurgery;  Laterality: Left;  . Back surgery  03/10/14    lumbar fusion   History reviewed. No pertinent family history. Social History  Substance Use Topics  . Smoking status: Never Smoker   . Smokeless tobacco: Never Used  . Alcohol Use: No   OB History    No data available     Review of Systems  Constitutional: Negative for fever.  Gastrointestinal: Negative for vomiting.  Musculoskeletal: Negative for neck pain.  Neurological: Negative for weakness.  All other systems reviewed and are negative.     Allergies  Plaquenil; Doxycycline; Ibuprofen; Penicillins; and Sulfa antibiotics  Home Medications   Prior to Admission medications   Medication Sig Start Date End Date Taking? Authorizing Provider  benzonatate (TESSALON) 100 MG capsule Take 1 capsule (100 mg total) by mouth every 8 (eight) hours. Patient not taking: Reported on 06/18/2014 05/25/14   Eber Hong, MD  diazepam (VALIUM) 5 MG tablet Take 5 mg by mouth every 6 (six) hours as needed for muscle spasms.    Historical Provider, MD  gabapentin (NEURONTIN) 300 MG capsule Take 300 mg by mouth 3 (three) times daily.     Historical Provider, MD  hydrochlorothiazide (HYDRODIURIL) 25 MG tablet Take 25 mg by mouth daily.    Historical Provider, MD  HYDROcodone-acetaminophen (NORCO) 5-325 MG per tablet Take 1-2 tablets by mouth every 4 (four) hours as needed. Patient not taking: Reported on 05/26/2014 05/03/14   Blane Ohara, MD  HYDROmorphone (DILAUDID) 4 MG tablet Take one tab po q 4-6 hrs prn pain 03/26/14   Tammy Triplett, PA-C  ondansetron (ZOFRAN ODT) 4 MG disintegrating tablet Take 1 tablet (4 mg total) by mouth every 8 (eight) hours as needed for nausea or vomiting. Patient not taking: Reported on 06/18/2014 05/26/14   Teressa Lower, NP  ondansetron (ZOFRAN) 8 MG tablet Take 1 tablet (8 mg total) by mouth every 8 (eight) hours as needed for nausea. 07/19/14   Burgess Amor, PA-C  phenazopyridine (PYRIDIUM) 200 MG tablet Take 1 tablet (200 mg total) by mouth 3 (three) times daily. Patient not taking: Reported on 04/09/2014 03/29/14   April Palumbo, MD  potassium chloride SA (K-DUR,KLOR-CON) 20 MEQ tablet Take 1 tablet (20 mEq total) by mouth daily. 05/11/14   Lula Olszewski, MD  sulfamethoxazole-trimethoprim (BACTRIM DS,SEPTRA DS) 800-160 MG per tablet Take 1 tablet by mouth 2 (two) times daily. For 10 days, starting 2/29    Historical Provider, MD   BP 172/86 mmHg  Pulse 102  Temp(Src) 98.1 F (36.7 C) (Oral)  Resp 16  Ht 5\' 7"  (1.702 m)  Wt 168 lb (76.204 kg)  BMI 26.31 kg/m2  SpO2 98% Physical Exam  Constitutional: She is oriented to person, place, and time. She appears well-developed and well-nourished.  HENT:  Head: Normocephalic and atraumatic.  Cardiovascular: Normal rate and regular rhythm.   Pulmonary/Chest: Effort normal and breath sounds normal.  Abdominal: Soft. Bowel sounds are normal. There is no tenderness.  Musculoskeletal:       Cervical back: Normal.       Thoracic back: Normal.       Lumbar back: She exhibits bony tenderness.  Neurological: She is alert and oriented to person, place, and time. She exhibits normal muscle tone. Coordination normal.  Skin: Skin is warm and dry.  Nursing note and vitals reviewed.   ED Course  Procedures (including critical care time) Labs Review Labs Reviewed - No data to display  Imaging Review Dg Lumbar Spine Complete  02/13/2015   CLINICAL DATA:  Fall. Pt states she fell on wet grass this morning and landed on her buttocks when she heard a "pop." Pt reports pain is radiating from her back to her stomach. Hx degenerative disk disease and lumbar surgery. Stimulator present.  EXAM: LUMBAR SPINE - COMPLETE 4+ VIEW  COMPARISON:  01/03/2015  FINDINGS: Previous  instrumented PLIF L4-S1, hardware intact. Posterior implanted stimulator device. Alignment preserved. Mild narrowing of the L3-4 disc stable.  Mild compression fracture deformity of L1 with less than 20% loss of height anteriorly, new since previous exam. No definite retropulsion or posterior element involvement on plain film radiography.  IMPRESSION: 1. Mild L1 compression fracture, acute/subacute, without evident complicating features. 2. Stable postop changes L4-S1.   Electronically Signed   By: 01/05/2015 M.D.   On: 02/13/2015 14:56   I have personally reviewed and evaluated these images and lab results as part of my medical decision-making.   EKG Interpretation None      MDM   Final diagnoses:  Fall, initial encounter  Compression fracture of lumbar vertebra, closed, initial encounter (HCC)    Pt is at baseline with symptoms. Pt asking for 24 hours of coverage till she get her chronic medications fill. Will do a this time. Pt to follow up with orthopedist   Teressa Lower, NP 02/13/15 1533  Vanetta Mulders, MD 02/14/15 1511

## 2015-03-19 ENCOUNTER — Encounter (HOSPITAL_COMMUNITY): Payer: Self-pay | Admitting: *Deleted

## 2015-03-19 ENCOUNTER — Emergency Department (HOSPITAL_COMMUNITY)
Admission: EM | Admit: 2015-03-19 | Discharge: 2015-03-19 | Disposition: A | Discharge status: Home / Self Care | Payer: BLUE CROSS/BLUE SHIELD | Attending: Emergency Medicine | Admitting: Emergency Medicine

## 2015-03-19 DIAGNOSIS — Z880 Allergy status to penicillin: Secondary | ICD-10-CM | POA: Insufficient documentation

## 2015-03-19 DIAGNOSIS — M79642 Pain in left hand: Secondary | ICD-10-CM

## 2015-03-19 DIAGNOSIS — F329 Major depressive disorder, single episode, unspecified: Secondary | ICD-10-CM | POA: Insufficient documentation

## 2015-03-19 DIAGNOSIS — M1990 Unspecified osteoarthritis, unspecified site: Secondary | ICD-10-CM | POA: Insufficient documentation

## 2015-03-19 DIAGNOSIS — M069 Rheumatoid arthritis, unspecified: Secondary | ICD-10-CM | POA: Diagnosis not present

## 2015-03-19 DIAGNOSIS — Z87442 Personal history of urinary calculi: Secondary | ICD-10-CM | POA: Diagnosis not present

## 2015-03-19 DIAGNOSIS — Z79899 Other long term (current) drug therapy: Secondary | ICD-10-CM | POA: Insufficient documentation

## 2015-03-19 DIAGNOSIS — Z8701 Personal history of pneumonia (recurrent): Secondary | ICD-10-CM | POA: Diagnosis not present

## 2015-03-19 DIAGNOSIS — G8929 Other chronic pain: Secondary | ICD-10-CM | POA: Insufficient documentation

## 2015-03-19 MED ORDER — KETOROLAC TROMETHAMINE 60 MG/2ML IM SOLN
60.0000 mg | Freq: Once | INTRAMUSCULAR | Status: AC
  Administered 2015-03-19: 60 mg via INTRAMUSCULAR
  Filled 2015-03-19: qty 2

## 2015-03-19 MED ORDER — DIPHENHYDRAMINE HCL 25 MG PO CAPS
25.0000 mg | ORAL_CAPSULE | Freq: Once | ORAL | Status: AC
  Administered 2015-03-19: 25 mg via ORAL
  Filled 2015-03-19: qty 1

## 2015-03-19 MED ORDER — TRAMADOL HCL 50 MG PO TABS
50.0000 mg | ORAL_TABLET | Freq: Once | ORAL | Status: AC
  Administered 2015-03-19: 50 mg via ORAL
  Filled 2015-03-19: qty 1

## 2015-03-19 NOTE — ED Notes (Signed)
Pt states pain to left hand / forearm x ~10 weeks intermittently but increasingly getting worse. Hx of neuropathy and arthritis

## 2015-03-19 NOTE — Discharge Instructions (Signed)
Your examination favors an exacerbation of your rheumatoid arthritis. Please see your rheumatology doctor, and your primary care physician for assistance with breakthrough pains. Your treated in the emergency department with intramuscular pain and anti-inflammatory medication, and oral narcotic pain medication. Please use caution getting around. Rheumatoid Arthritis Rheumatoid arthritis is a long-term (chronic) inflammatory disease that causes pain, swelling, and stiffness of the joints. It can affect the entire body, including the eyes and lungs. The effects of rheumatoid arthritis vary widely among those with the condition. CAUSES The cause of rheumatoid arthritis is not known. It tends to run in families and is more common in women. Certain cells of the body's natural defense system (immune system) do not work properly and begin to attack healthy joints. It primarily involves the connective tissue that lines the joints (synovial membrane). This can cause damage to the joint. SYMPTOMS  Pain, stiffness, swelling, and decreased motion of many joints, especially in the hands and feet.  Stiffness that is worse in the morning. It may last 1-2 hours or longer.  Numbness and tingling in the hands.  Fatigue.  Loss of appetite.  Weight loss.  Low-grade fever.  Dry eyes and mouth.  Firm lumps (rheumatoid nodules) that grow beneath the skin in areas such as the elbows and hands. DIAGNOSIS Diagnosis is based on the symptoms described, an exam, and blood tests. Sometimes, X-rays are helpful. TREATMENT The goals of treatment are to relieve pain, reduce inflammation, and to slow down or stop joint damage and disability. Methods vary and may include:  Maintaining a balance of rest, exercise, and proper nutrition.  Your health care provider may adjust your medicines every 3 months until treatment goals are reached. Common medicines include:  Pain relievers (analgesics).  Corticosteroids and  nonsteroidal anti-inflammatory drugs (NSAIDs) to reduce inflammation.  Disease-modifying antirheumatic drugs (DMARDs) to try to slow the course of the disease.  Biologic response modifiers to reduce inflammation and damage.  Physical therapy and occupational therapy.  Surgery for patients with severe joint damage. Joint replacement or fusing of joints may be needed.  Routine monitoring and ongoing care, such as office visits, blood and urine tests, and X-rays. Your health care provider will work with you to identify the best treatment option for you, based on an assessment of the overall disease activity in your body. HOME CARE INSTRUCTIONS  Remain physically active and reduce activity when the disease gets worse.  Eat a well-balanced diet.  Put heat on affected joints when you wake up and before activities. Keep the heat on the affected joint for as long as directed by your health care provider.  Put ice on affected joints following activities or exercising.  Put ice in a plastic bag.  Place a towel between your skin and the bag.  Leave the ice on for 15-20 minutes, 3-4 times per day, or as directed by your health care provider.  Take medicines and supplements only as directed by your health care provider.  Use splints as directed by your health care provider. Splints help maintain joint position and function.  Do not sleep with pillows under your knees. This may lead to spasms.  Participate in a self-management program to keep current with the latest treatment and coping skills. SEEK IMMEDIATE MEDICAL CARE IF:  You have fainting episodes.  You have periods of extreme weakness.  You rapidly develop a hot, painful joint that is more severe than usual joint aches.  You have chills.  You have a fever. FOR  MORE INFORMATION  [employer] Rheumatology: www.rheumatology.org  Arthritis [employer]: www.arthritis.org   This information is not intended to replace  advice given to you by your health care provider. Make sure you discuss any questions you have with your health care provider.   Document Released: 04/27/2000 Document Revised: 05/21/2014 Document Reviewed: 06/06/2011 Elsevier Interactive Patient Education [employer]

## 2015-03-19 NOTE — ED Provider Notes (Signed)
CSN: [id number]     Arrival date & time 03/19/15  1220 History   First MD Initiated Contact with Patient 03/19/15 1308     Chief Complaint  Patient presents with  . Hand Pain     (Consider location/radiation/quality/duration/timing/severity/associated sxs/prior Treatment) HPI Comments: Patient presents to the emergency department with complaint of left hand and forearm area pain. The patient states that for approximately 10 weeks she's been having pain that is at times sharp, at times aching, and also accompanied at times by a sensation of numbness in her left hand and forearm. She has a history rheumatoid arthritis. There's been no direct trauma reported. She presents now for evaluation concerning this pain.  The history is provided by the patient.    Past Medical History  Diagnosis Date  . Complication of anesthesia     pt states after surgery in 2000 had to wear a heart monitor for 3 days.  Marland Kitchen PONV (postoperative nausea and vomiting)   . Peripheral edema     takes HCTZ daily   . Hyperlipidemia     takes Fenofibrate daily  . Pneumonia 97yrs ago    hx of  . History of bronchitis 71yrs ago  . Weakness     and tingling on left side  . Joint pain   . Joint swelling   . Chronic back pain     stenosis  . GERD (gastroesophageal reflux disease)     takes OTC meds if needed  . Kidney stone     has one right now on the right side but not giving any problems  . History of blood transfusion     as a child  . Depression   . Chronic pain     takes Lexapro daily  . Rheumatoid arthritis (HCC)   . Osteoarthritis   . DDD (degenerative disc disease)    Past Surgical History  Procedure Laterality Date  . Abdominal hysterectomy  2000  . Tubal ligation  24 yrs ago  . Lumbar laminectomy/decompression microdiscectomy Left 12/08/2013    Procedure: LEFT LUMBAR FOUR-FIVEW, LUMBAR FIVE-SACRAL ONE LAMINECTOMY;  Surgeon: Karn Cassis, MD;  Location: [facility];  Service: Neurosurgery;   Laterality: Left;  . Back surgery  03/10/14    lumbar fusion   No family history on file. Social History  Substance Use Topics  . Smoking status: Never Smoker   . Smokeless tobacco: Never Used  . Alcohol Use: No   OB History    No data available     Review of Systems  Musculoskeletal: Positive for back pain and arthralgias.  Psychiatric/Behavioral:       Depression  All other systems reviewed and are negative.     Allergies  Plaquenil; Doxycycline; Ibuprofen; Penicillins; and Sulfa antibiotics  Home Medications   Prior to Admission medications   Medication Sig Start Date End Date Taking? Authorizing Provider  benzonatate (TESSALON) 100 MG capsule Take 1 capsule (100 mg total) by mouth every 8 (eight) hours. Patient not taking: Reported on 06/18/2014 05/25/14   Eber Hong, MD  diazepam (VALIUM) 5 MG tablet Take 5 mg by mouth every 6 (six) hours as needed for muscle spasms.    Historical Provider, MD  gabapentin (NEURONTIN) 300 MG capsule Take 300 mg by mouth 3 (three) times daily.     Historical Provider, MD  hydrochlorothiazide (HYDRODIURIL) 25 MG tablet Take 25 mg by mouth daily.    Historical Provider, MD  HYDROcodone-acetaminophen (NORCO) 5-325 MG per tablet Take  1-2 tablets by mouth every 4 (four) hours as needed. Patient not taking: Reported on 05/26/2014 05/03/14   Blane Ohara, MD  HYDROmorphone (DILAUDID) 4 MG tablet Take one tab po q 4-6 hrs prn pain 03/26/14   Tammy Triplett, PA-C  ondansetron (ZOFRAN ODT) 4 MG disintegrating tablet Take 1 tablet (4 mg total) by mouth every 8 (eight) hours as needed for nausea or vomiting. Patient not taking: Reported on 06/18/2014 05/26/14   Teressa Lower, NP  ondansetron (ZOFRAN) 8 MG tablet Take 1 tablet (8 mg total) by mouth every 8 (eight) hours as needed for nausea. 07/19/14   Burgess Amor, PA-C  oxyCODONE-acetaminophen (ENDOCET) 10-325 MG tablet Take 1 tablet by mouth every 4 (four) hours as needed for pain. 02/13/15   Teressa Lower, NP  phenazopyridine (PYRIDIUM) 200 MG tablet Take 1 tablet (200 mg total) by mouth 3 (three) times daily. Patient not taking: Reported on 04/09/2014 03/29/14   April Palumbo, MD  potassium chloride SA (K-DUR,KLOR-CON) 20 MEQ tablet Take 1 tablet (20 mEq total) by mouth daily. 05/11/14   Lula Olszewski, MD  sulfamethoxazole-trimethoprim (BACTRIM DS,SEPTRA DS) 800-160 MG per tablet Take 1 tablet by mouth 2 (two) times daily. For 10 days, starting 2/29    Historical Provider, MD   BP 141/88 mmHg  Pulse 99  Temp(Src) 97.5 F (36.4 C) (Oral)  Resp 16  Ht 5\' 7"  (1.702 m)  Wt 165 lb (74.844 kg)  BMI 25.84 kg/m2  SpO2 100% Physical Exam  Constitutional: She is oriented to person, place, and time. She appears well-developed and well-nourished.  Non-toxic appearance.  HENT:  Head: Normocephalic.  Right Ear: Tympanic membrane and external ear normal.  Left Ear: Tympanic membrane and external ear normal.  Eyes: EOM and lids are normal. Pupils are equal, round, and reactive to light.  Neck: Normal range of motion. Neck supple. Carotid bruit is not present.  Cardiovascular: Normal rate, regular rhythm, normal heart sounds, intact distal pulses and normal pulses.   Pulmonary/Chest: Breath sounds normal. No respiratory distress.  Abdominal: Soft. Bowel sounds are normal. There is no tenderness. There is no guarding.  Musculoskeletal: Normal range of motion. She exhibits no edema.  Degenerative joint disease changes of multiple joints. There is full range of motion of the right and left shoulder and elbow. There is pain and stiffness of both wrist and fingers of right and left hand. Capillary refill is less than 2 seconds. Radial pulses are 2+ bilaterally.  There no hot joints of the upper or lower extremities. There is no pitting edema appreciated.  Lymphadenopathy:       Head (right side): No submandibular adenopathy present.       Head (left side): No submandibular adenopathy present.     She has no cervical adenopathy.  Neurological: She is alert and oriented to person, place, and time. She has normal strength. No cranial nerve deficit or sensory deficit.  Skin: Skin is warm and dry.  Psychiatric: She has a normal mood and affect. Her speech is normal.  Nursing note and vitals reviewed.   ED Course  Procedures (including critical care time) Labs Review Labs Reviewed - No data to display  Imaging Review No results found. I have personally reviewed and evaluated these images and lab results as part of my medical decision-making.   EKG Interpretation None      MDM  The examination favors a rheumatoid arthritis flare. The patient is being seen by a rheumatology specialist. She takes Endocet for  assistance with her pain. She states however that during this flare it seemed as though the Endocet was reared slow in taking care of her discomfort. The patient was treated in the emergency department with intramuscular Toradol, Benadryl, and oral Ultram. The patient is to follow-up with her specialist next week.    Final diagnoses:  Rheumatoid arthritis flare (HCC)  Left hand pain    *I have reviewed nursing notes, vital signs, and all appropriate lab and imaging results for this patient.[address], PA-C 03/20/15 9678  Bethann Berkshire, MD 03/20/15 [phone]

## 2016-01-31 IMAGING — CT CT ANGIO CHEST
1 of 10 series · 13 of 36 positions shown · IV contrast (omnipaque)
Comparison: None.

CLINICAL DATA: Elevated D-dimer, palpable abnormality behind left
knee, back surgery 2 months ago

EXAM:
CT ANGIOGRAPHY CHEST WITH CONTRAST
TECHNIQUE: Multidetector CT imaging of the chest was performed using the
standard protocol during bolus administration of intravenous
contrast. Multiplanar CT image reconstructions and MIPs were
obtained to evaluate the vascular anatomy.
CONTRAST:  80mL OMNIPAQUE IOHEXOL 350 MG/ML SOLN

[Series 407: thins pacs · axial · 0.68mm/px · z∈[-281,-80]mm · 13 of 235 slices shown]
[im 17/235  lung]
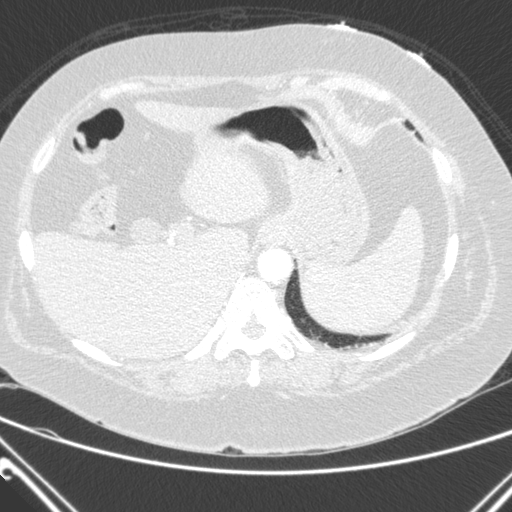
[im 34/235  mediastinal]
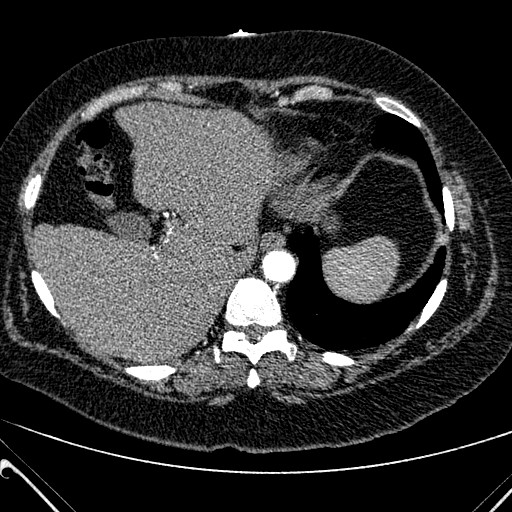
[im 51/235  lung]
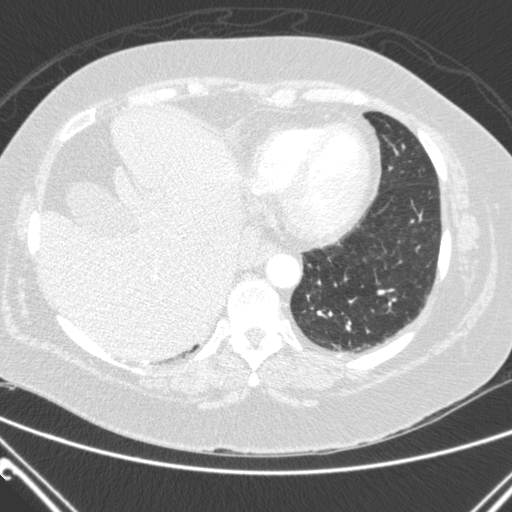
[im 67/235  mediastinal]
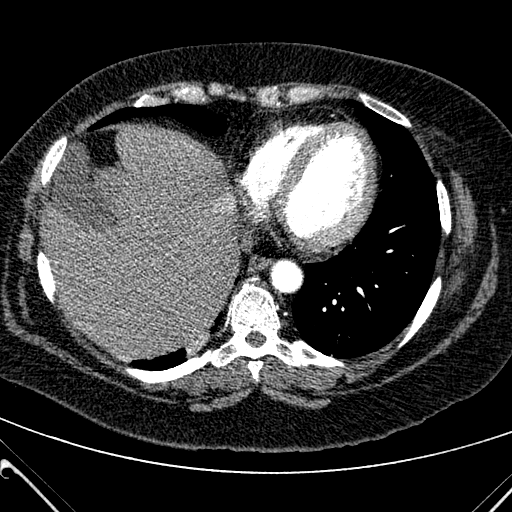
[im 84/235  lung]
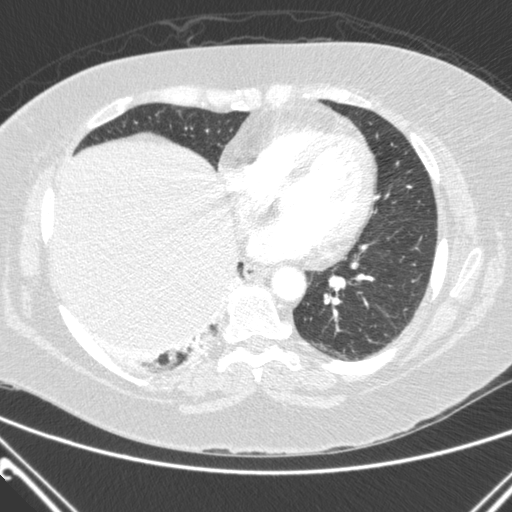
[im 101/235  mediastinal]
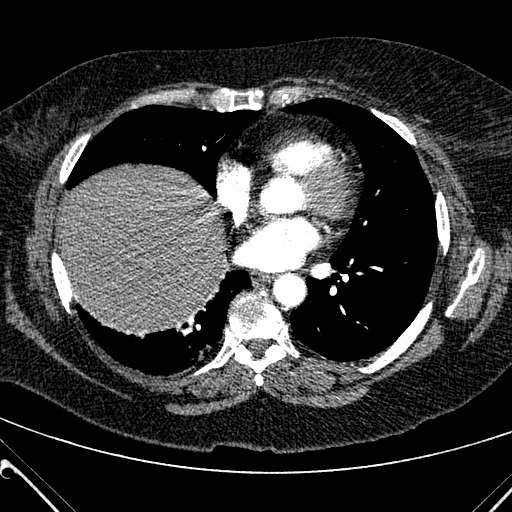
[im 118/235  lung]
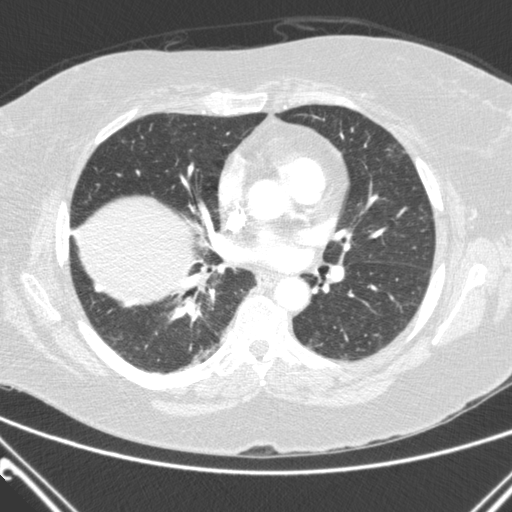
[im 134/235  mediastinal]
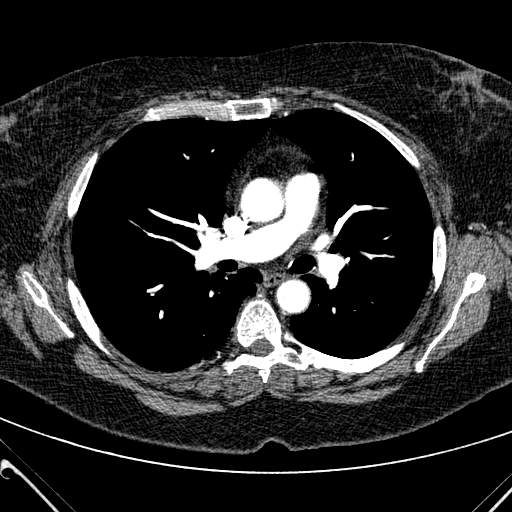
[im 151/235  lung]
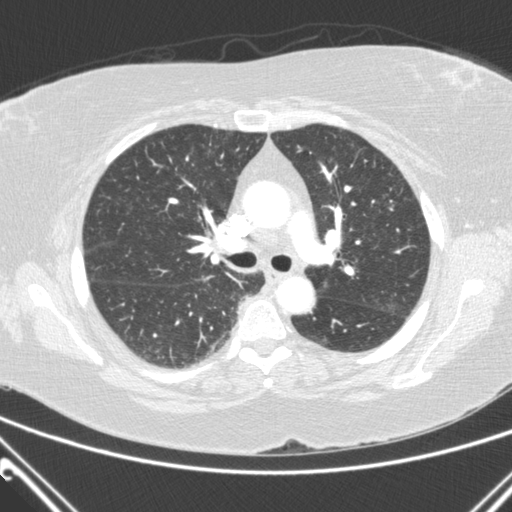
[im 168/235  mediastinal]
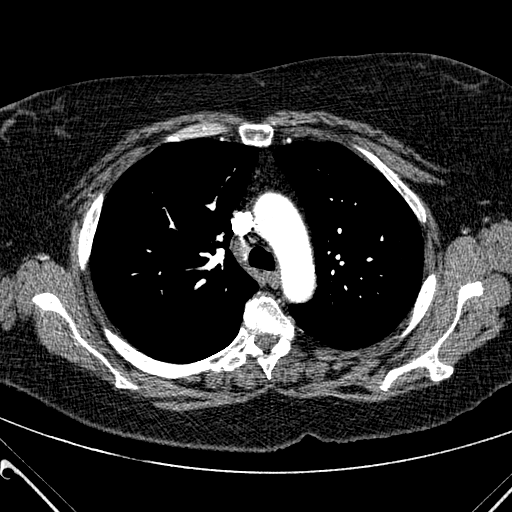
[im 184/235  lung]
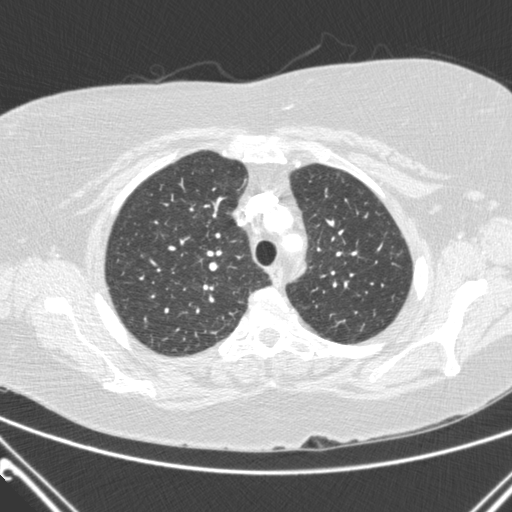
[im 201/235  mediastinal]
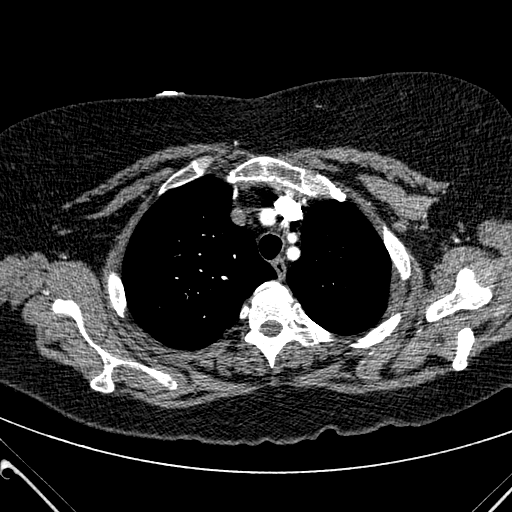
[im 218/235  lung]
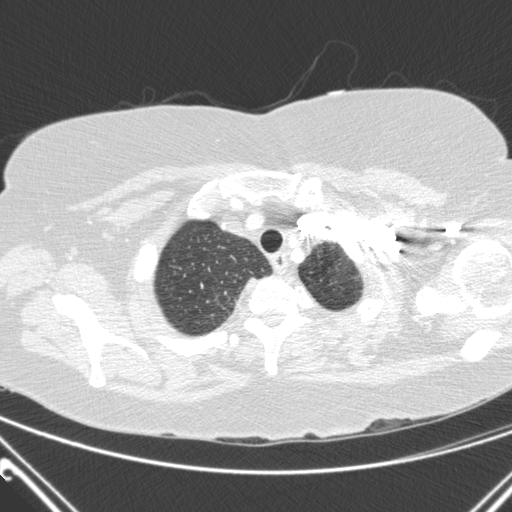

[13 of 36 positions shown; findings below may reference images not displayed]

FINDINGS: No evidence of pulmonary embolism.

Mild atelectasis in in the posterior right lower lobe. No focal
consolidation. No suspicious pulmonary nodules. No pleural effusion
or pneumothorax.

Mild eventration of the right hemidiaphragm.

Heart is normal in size. No pericardial effusion. Mild
atherosclerotic calcifications of the aortic arch.

Small mediastinal lymph nodes which do not meet pathologic CT size
criteria.

Visualized upper abdomen is unremarkable.

Mild degenerative changes of the visualized thoracolumbar spine.

Review of the MIP images confirms the above findings.
IMPRESSION: No evidence of pulmonary embolism.

No evidence of acute cardiopulmonary disease.

## 2016-06-22 DIAGNOSIS — M069 Rheumatoid arthritis, unspecified: Secondary | ICD-10-CM | POA: Diagnosis not present

## 2016-06-22 DIAGNOSIS — G5602 Carpal tunnel syndrome, left upper limb: Secondary | ICD-10-CM | POA: Diagnosis not present

## 2016-06-22 DIAGNOSIS — F330 Major depressive disorder, recurrent, mild: Secondary | ICD-10-CM | POA: Diagnosis not present

## 2016-06-22 DIAGNOSIS — M545 Low back pain: Secondary | ICD-10-CM | POA: Diagnosis not present

## 2016-06-22 DIAGNOSIS — G603 Idiopathic progressive neuropathy: Secondary | ICD-10-CM | POA: Diagnosis not present

## 2016-06-22 DIAGNOSIS — Z79891 Long term (current) use of opiate analgesic: Secondary | ICD-10-CM | POA: Diagnosis not present

## 2016-07-02 DIAGNOSIS — M05741 Rheumatoid arthritis with rheumatoid factor of right hand without organ or systems involvement: Secondary | ICD-10-CM | POA: Diagnosis not present

## 2016-07-02 DIAGNOSIS — R52 Pain, unspecified: Secondary | ICD-10-CM | POA: Diagnosis not present

## 2016-07-02 DIAGNOSIS — M5136 Other intervertebral disc degeneration, lumbar region: Secondary | ICD-10-CM | POA: Diagnosis not present

## 2016-07-02 DIAGNOSIS — F418 Other specified anxiety disorders: Secondary | ICD-10-CM | POA: Diagnosis not present

## 2016-07-02 DIAGNOSIS — Z6828 Body mass index (BMI) 28.0-28.9, adult: Secondary | ICD-10-CM | POA: Diagnosis not present

## 2016-07-02 DIAGNOSIS — R509 Fever, unspecified: Secondary | ICD-10-CM | POA: Diagnosis not present

## 2016-07-02 DIAGNOSIS — B349 Viral infection, unspecified: Secondary | ICD-10-CM | POA: Diagnosis not present

## 2016-07-02 DIAGNOSIS — G629 Polyneuropathy, unspecified: Secondary | ICD-10-CM | POA: Diagnosis not present

## 2016-07-02 DIAGNOSIS — M05742 Rheumatoid arthritis with rheumatoid factor of left hand without organ or systems involvement: Secondary | ICD-10-CM | POA: Diagnosis not present

## 2016-09-01 ENCOUNTER — Encounter (HOSPITAL_COMMUNITY): Payer: Self-pay

## 2016-09-01 ENCOUNTER — Emergency Department (HOSPITAL_COMMUNITY)
Admission: EM | Admit: 2016-09-01 | Discharge: 2016-09-02 | Disposition: A | Discharge status: Home / Self Care | Payer: Medicaid Other | Attending: Emergency Medicine | Admitting: Emergency Medicine

## 2016-09-01 DIAGNOSIS — Z79899 Other long term (current) drug therapy: Secondary | ICD-10-CM | POA: Diagnosis not present

## 2016-09-01 DIAGNOSIS — T7840XA Allergy, unspecified, initial encounter: Secondary | ICD-10-CM | POA: Diagnosis not present

## 2016-09-01 DIAGNOSIS — N3000 Acute cystitis without hematuria: Secondary | ICD-10-CM

## 2016-09-01 DIAGNOSIS — R21 Rash and other nonspecific skin eruption: Secondary | ICD-10-CM | POA: Diagnosis present

## 2016-09-01 LAB — URINALYSIS, ROUTINE W REFLEX MICROSCOPIC
Bilirubin Urine: NEGATIVE
Glucose, UA: NEGATIVE mg/dL
Ketones, ur: NEGATIVE mg/dL
Nitrite: POSITIVE — AB
Protein, ur: NEGATIVE mg/dL
Specific Gravity, Urine: 1.015 (ref 1.005–1.030)
pH: 5 (ref 5.0–8.0)

## 2016-09-01 MED ORDER — PREDNISONE 20 MG PO TABS: 40.0000 mg | ORAL_TABLET | Freq: Every day | ORAL | 0 refills | Status: AC

## 2016-09-01 MED ORDER — PREDNISONE 20 MG PO TABS
40.0000 mg | ORAL_TABLET | Freq: Once | ORAL | Status: AC
  Administered 2016-09-01: 40 mg via ORAL
  Filled 2016-09-01: qty 2

## 2016-09-01 MED ORDER — DIPHENHYDRAMINE HCL 25 MG PO CAPS
25.0000 mg | ORAL_CAPSULE | Freq: Once | ORAL | Status: AC
  Administered 2016-09-01: 25 mg via ORAL
  Filled 2016-09-01: qty 1

## 2016-09-01 MED ORDER — CIPROFLOXACIN HCL 250 MG PO TABS
500.0000 mg | ORAL_TABLET | Freq: Once | ORAL | Status: AC
  Administered 2016-09-02: 500 mg via ORAL
  Filled 2016-09-01: qty 2

## 2016-09-01 MED ORDER — CIPROFLOXACIN HCL 500 MG PO TABS: 500.0000 mg | ORAL_TABLET | Freq: Two times a day (BID) | ORAL | 0 refills | Status: DC

## 2016-09-01 NOTE — ED Triage Notes (Signed)
Patient started taking Lotensin on Thursday, now has broken out on her face and neck.  Denies any shortness of breath.

## 2016-09-01 NOTE — ED Provider Notes (Signed)
[facility] DEPT Provider Note   CSN: [id number] Arrival date & time: 09/01/16  2008     History   Chief Complaint Chief Complaint  Patient presents with  . Allergic Reaction    HPI Kaitlin Fleming is a 56 y.o. female.  HPI  Patient presents with rash. It is an itchy raised rash on her face. Began a couple days ago. She did start Lotensin Tuesday. Which is a new medicine for her. No difficulty breathing. No swelling of her lips or tongue. She had not been on Lotensin previously. No abdominal pain. Rash now progressed down to her neck also. Patient also has had some dysuria for the last couple days. No fevers. She is currently on 10 mg of prednisone a day for her rheumatoid arthritis.   Past Medical History:  Diagnosis Date  . Chronic back pain    stenosis  . Chronic pain    takes Lexapro daily  . Complication of anesthesia    pt states after surgery in 2000 had to wear a heart monitor for 3 days.  . DDD (degenerative disc disease)   . Depression   . GERD (gastroesophageal reflux disease)    takes OTC meds if needed  . History of blood transfusion    as a child  . History of bronchitis 59yrs ago  . Hyperlipidemia    takes Fenofibrate daily  . Joint pain   . Joint swelling   . Kidney stone    has one right now on the right side but not giving any problems  . Osteoarthritis   . Peripheral edema    takes HCTZ daily   . Pneumonia 62yrs ago   hx of  . PONV (postoperative nausea and vomiting)   . Rheumatoid arthritis (HCC)   . Weakness    and tingling on left side    Patient Active Problem List   Diagnosis Date Noted  . Lumbar radiculopathy, chronic 06/22/2014  . Lumbar degenerative disc disease 03/10/2014  . Lumbar stenosis without neurogenic claudication 12/08/2013    Past Surgical History:  Procedure Laterality Date  . ABDOMINAL HYSTERECTOMY  2000  . BACK SURGERY  03/10/14   lumbar fusion  . LUMBAR LAMINECTOMY/DECOMPRESSION MICRODISCECTOMY Left  12/08/2013   Procedure: LEFT LUMBAR FOUR-FIVEW, LUMBAR FIVE-SACRAL ONE LAMINECTOMY;  Surgeon: Karn Cassis, MD;  Location: [facility];  Service: Neurosurgery;  Laterality: Left;  . TUBAL LIGATION  24 yrs ago    OB History    No data available       Home Medications    Prior to Admission medications   Medication Sig Start Date End Date Taking? Authorizing Provider  alendronate (FOSAMAX) 70 MG tablet Take 1 tablet by mouth once a week. 07/17/16  Yes Historical Provider, MD  hydrochlorothiazide (HYDRODIURIL) 25 MG tablet Take 25 mg by mouth daily.   Yes Historical Provider, MD  levocetirizine (XYZAL) 5 MG tablet Take 5 mg by mouth every evening.   Yes Historical Provider, MD  oxyCODONE-acetaminophen (ENDOCET) 10-325 MG tablet Take 1 tablet by mouth every 4 (four) hours as needed for pain. 02/13/15  Yes Teressa Lower, NP  sertraline (ZOLOFT) 25 MG tablet Take 50 tablets by mouth daily.    Yes Historical Provider, MD  Tofacitinib Citrate (XELJANZ) 5 MG TABS Take 1 tablet by mouth daily.   Yes Historical Provider, MD  Vitamin D, Ergocalciferol, (DRISDOL) 50000 units CAPS capsule Take 1 capsule by mouth once a week. 07/24/16  Yes Historical Provider, MD  diazepam (VALIUM)  5 MG tablet Take 5 mg by mouth every 6 (six) hours as needed for muscle spasms.    Historical Provider, MD  gabapentin (NEURONTIN) 300 MG capsule Take 300 mg by mouth 3 (three) times daily.     Historical Provider, MD  HYDROmorphone (DILAUDID) 4 MG tablet Take one tab po q 4-6 hrs prn pain Patient not taking: Reported on 09/01/2016 03/26/14   Tammy Triplett, PA-C  ondansetron (ZOFRAN ODT) 4 MG disintegrating tablet Take 1 tablet (4 mg total) by mouth every 8 (eight) hours as needed for nausea or vomiting. Patient not taking: Reported on 06/18/2014 05/26/14   Teressa Lower, NP  ondansetron (ZOFRAN) 8 MG tablet Take 1 tablet (8 mg total) by mouth every 8 (eight) hours as needed for nausea. Patient not taking: Reported on  09/01/2016 07/19/14   Burgess Amor, PA-C  potassium chloride SA (K-DUR,KLOR-CON) 20 MEQ tablet Take 1 tablet (20 mEq total) by mouth daily. Patient not taking: Reported on 09/01/2016 05/11/14   Lula Olszewski, MD  predniSONE (DELTASONE) 20 MG tablet Take 2 tablets (40 mg total) by mouth daily. 09/01/16 09/04/16  Benjiman Core, MD  sulfamethoxazole-trimethoprim (BACTRIM DS,SEPTRA DS) 800-160 MG per tablet Take 1 tablet by mouth 2 (two) times daily. For 10 days, starting 2/29    Historical Provider, MD    Family History No family history on file.  Social History Social History  Substance Use Topics  . Smoking status: Never Smoker  . Smokeless tobacco: Never Used  . Alcohol use No     Allergies   Lotensin [benazepril hcl]; Plaquenil [hydroxychloroquine sulfate]; Doxycycline; Ibuprofen; Penicillins; and Sulfa antibiotics   Review of Systems Review of Systems  Constitutional: Negative for appetite change and fever.  HENT: Negative for congestion.   Respiratory: Negative for cough, shortness of breath and wheezing.   Cardiovascular: Negative for chest pain.  Gastrointestinal: Negative for abdominal pain.  Genitourinary: Positive for dysuria.  Musculoskeletal: Negative for arthralgias.  Skin: Positive for rash. Negative for wound.  Neurological: Negative for syncope and light-headedness.  Hematological: Negative for adenopathy.  Psychiatric/Behavioral: Negative for confusion.     Physical Exam Updated Vital Signs BP (!) 153/70 (BP Location: Left Arm)   Pulse 87   Temp 98.6 F (37 C) (Oral)   Resp 18   Ht 5\' 7"  (1.702 m)   Wt 176 lb (79.8 kg)   SpO2 100%   BMI 27.57 kg/m   Physical Exam  Constitutional: She appears well-developed.  HENT:  Head: Atraumatic.  Erythematous mildly raised rash over cheeks. Also some involvement of skin and neck anteriorly. Appears to spare the mouth area. No fluctuance. No involvement of mucous membranes.  Eyes: EOM are normal.  Cardiovascular:  Normal rate.   Pulmonary/Chest: Effort normal. She has no wheezes.  Abdominal: Soft.  Genitourinary:  Genitourinary Comments: No CVA tenderness.  Musculoskeletal: She exhibits no edema.  Neurological: She is alert.  Skin: Skin is warm. Capillary refill takes less than 2 seconds. Rash noted. There is erythema.  Psychiatric: She has a normal mood and affect.     ED Treatments / Results  Labs (all labs ordered are listed, but only abnormal results are displayed) Labs Reviewed  URINALYSIS, ROUTINE W REFLEX MICROSCOPIC    EKG  EKG Interpretation None       Radiology No results found.  Procedures Procedures (including critical care time)  Medications Ordered in ED Medications  predniSONE (DELTASONE) tablet 40 mg (40 mg Oral Given 09/01/16 2131)  diphenhydrAMINE (BENADRYL) capsule 25  mg (25 mg Oral Given 09/01/16 2130)     Initial Impression / Assessment and Plan / ED Course  I have reviewed the triage vital signs and the nursing notes.  Pertinent labs & imaging results that were available during my care of the patient were reviewed by me and considered in my medical decision making (see chart for details).     Patient with facial rash. Recently started new medicine. Lotensin could be the cause. Does have a somewhat malar appearance though. No history of lupus but does have rheumatoid arthritis. No mucous membrane involvement. Will treat with increased dose of steroids. Urinalysis pending at this time due to dysuria. Care turned over to Dr. Deretha Emory  Final Clinical Impressions(s) / ED Diagnoses   Final diagnoses:  Rash  Allergic reaction, initial encounter    New Prescriptions New Prescriptions   PREDNISONE (DELTASONE) 20 MG TABLET    Take 2 tablets (40 mg total) by mouth daily.     Benjiman Core, MD 09/01/16 2136

## 2016-09-01 NOTE — Discharge Instructions (Addendum)
Stop the Lotensin. Follow-up with your rheumatologist and her primary care doctor.  Take the antibiotic Cipro for the urinary tract infection. Take it for the next 7 days. Should improve over the next couple days. Return for any new or worse symptoms. Take prednisone as directed. Urine was sent for culture.

## 2016-09-01 NOTE — ED Notes (Signed)
Started new med on Tues - has been breaking out in rash since No benadryl nor pepcid Here also because of intermittent burning with urination

## 2016-09-05 LAB — URINE CULTURE: Culture: >=100000 — AB

## 2016-09-06 ENCOUNTER — Telehealth: Payer: Self-pay | Admitting: *Deleted

## 2016-09-06 NOTE — Telephone Encounter (Signed)
Post ED Visit - Positive Culture Follow-up  Culture report reviewed by antimicrobial stewardship pharmacist:  []  , Pharm.D. [x]  Enzo Bi, Pharm.D., BCPS AQ-ID []  , Pharm.D., BCPS []  Celedonio Miyamoto, .D., BCPS []  [place], .D., BCPS, AAHIVP []  Georgina Pillion, Pharm.D., BCPS, AAHIVP []  [address], PharmD, BCPS []  , PharmD, BCPS []  [place], PharmD, BCPS  Positive urine culture Treated with Ciprofloxacin HCL, organism sensitive to the same and no further patient follow-up is required at this time.  [place], [place] [facility] 09/06/2016, 12:14 PM

## 2017-03-26 DIAGNOSIS — N390 Urinary tract infection, site not specified: Secondary | ICD-10-CM | POA: Diagnosis not present

## 2017-07-24 ENCOUNTER — Other Ambulatory Visit: Payer: Self-pay | Admitting: Neurology

## 2017-08-02 ENCOUNTER — Emergency Department (HOSPITAL_COMMUNITY): Payer: Medicaid Other

## 2017-08-02 ENCOUNTER — Encounter (HOSPITAL_COMMUNITY): Payer: Self-pay | Admitting: Emergency Medicine

## 2017-08-02 ENCOUNTER — Other Ambulatory Visit: Payer: Self-pay

## 2017-08-02 ENCOUNTER — Observation Stay (HOSPITAL_COMMUNITY)
Admission: EM | Admit: 2017-08-02 | Discharge: 2017-08-04 | Disposition: A | Discharge status: Home / Self Care | Payer: Medicaid Other | Attending: Internal Medicine | Admitting: Internal Medicine

## 2017-08-02 DIAGNOSIS — R509 Fever, unspecified: Secondary | ICD-10-CM | POA: Diagnosis present

## 2017-08-02 DIAGNOSIS — R0789 Other chest pain: Secondary | ICD-10-CM | POA: Insufficient documentation

## 2017-08-02 DIAGNOSIS — J111 Influenza due to unidentified influenza virus with other respiratory manifestations: Secondary | ICD-10-CM | POA: Diagnosis not present

## 2017-08-02 DIAGNOSIS — M5416 Radiculopathy, lumbar region: Secondary | ICD-10-CM | POA: Diagnosis present

## 2017-08-02 DIAGNOSIS — J101 Influenza due to other identified influenza virus with other respiratory manifestations: Secondary | ICD-10-CM | POA: Diagnosis present

## 2017-08-02 DIAGNOSIS — E876 Hypokalemia: Secondary | ICD-10-CM

## 2017-08-02 DIAGNOSIS — M069 Rheumatoid arthritis, unspecified: Secondary | ICD-10-CM | POA: Diagnosis present

## 2017-08-02 DIAGNOSIS — Z79899 Other long term (current) drug therapy: Secondary | ICD-10-CM | POA: Diagnosis not present

## 2017-08-02 LAB — URINALYSIS, ROUTINE W REFLEX MICROSCOPIC
Bacteria, UA: NONE SEEN
Bilirubin Urine: NEGATIVE
Glucose, UA: NEGATIVE mg/dL
Ketones, ur: NEGATIVE mg/dL
Leukocytes, UA: NEGATIVE
Nitrite: NEGATIVE
Protein, ur: NEGATIVE mg/dL
Specific Gravity, Urine: 1.024 (ref 1.005–1.030)
pH: 5 (ref 5.0–8.0)

## 2017-08-02 LAB — CBC WITH DIFFERENTIAL/PLATELET
Basophils Absolute: 0 10*3/uL (ref 0.0–0.1)
Basophils Relative: 0 %
Eosinophils Absolute: 0.1 10*3/uL (ref 0.0–0.7)
Eosinophils Relative: 1 %
HCT: 42.5 % (ref 36.0–46.0)
Hemoglobin: 13.2 g/dL (ref 12.0–15.0)
Lymphocytes Relative: 16 %
Lymphs Abs: 0.9 10*3/uL (ref 0.7–4.0)
MCH: 26.3 pg (ref 26.0–34.0)
MCHC: 31.1 g/dL (ref 30.0–36.0)
MCV: 84.8 fL (ref 78.0–100.0)
Monocytes Absolute: 0.8 10*3/uL (ref 0.1–1.0)
Monocytes Relative: 13 %
Neutro Abs: 4 10*3/uL (ref 1.7–7.7)
Neutrophils Relative %: 70 %
Platelets: 175 10*3/uL (ref 150–400)
RBC: 5.01 MIL/uL (ref 3.87–5.11)
RDW: 14.3 % (ref 11.5–15.5)
WBC: 5.6 10*3/uL (ref 4.0–10.5)

## 2017-08-02 LAB — INFLUENZA PANEL BY PCR (TYPE A & B)
Influenza A By PCR: NEGATIVE
Influenza B By PCR: POSITIVE — AB

## 2017-08-02 LAB — COMPREHENSIVE METABOLIC PANEL
ALT: 18 U/L (ref 14–54)
AST: 20 U/L (ref 15–41)
Albumin: 3.6 g/dL (ref 3.5–5.0)
Alkaline Phosphatase: 87 U/L (ref 38–126)
Anion gap: 12 (ref 5–15)
BUN: 7 mg/dL (ref 6–20)
CO2: 29 mmol/L (ref 22–32)
Calcium: 9 mg/dL (ref 8.9–10.3)
Chloride: 95 mmol/L — ABNORMAL LOW (ref 101–111)
Creatinine, Ser: 0.73 mg/dL (ref 0.44–1.00)
GFR calc Af Amer: >60 mL/min (ref >60)
GFR calc non Af Amer: >60 mL/min (ref >60)
Glucose, Bld: 97 mg/dL (ref 65–99)
Potassium: 2.7 mmol/L — CL (ref 3.5–5.1)
Sodium: 136 mmol/L (ref 135–145)
Total Bilirubin: 0.7 mg/dL (ref 0.3–1.2)
Total Protein: 7.5 g/dL (ref 6.5–8.1)

## 2017-08-02 LAB — PROTIME-INR
INR: 0.98
Prothrombin Time: 12.9 seconds (ref 11.4–15.2)

## 2017-08-02 LAB — I-STAT CG4 LACTIC ACID, ED
Lactic Acid, Venous: 1.06 mmol/L (ref 0.5–1.9)
Lactic Acid, Venous: 1.39 mmol/L (ref 0.5–1.9)

## 2017-08-02 LAB — TROPONIN I: Troponin I: <0.03 ng/mL (ref <0.03)

## 2017-08-02 MED ORDER — VITAMIN D (ERGOCALCIFEROL) 1.25 MG (50000 UNIT) PO CAPS
50000.0000 [IU] | ORAL_CAPSULE | ORAL | Status: DC
  Administered 2017-08-03: 50000 [IU] via ORAL
  Filled 2017-08-02: qty 1

## 2017-08-02 MED ORDER — ONDANSETRON 4 MG PO TBDP
ORAL_TABLET | ORAL | Status: AC
  Filled 2017-08-02: qty 1

## 2017-08-02 MED ORDER — OXYCODONE-ACETAMINOPHEN 10-325 MG PO TABS: 1.0000 | ORAL_TABLET | ORAL | Status: DC | PRN

## 2017-08-02 MED ORDER — OSELTAMIVIR PHOSPHATE 75 MG PO CAPS
75.0000 mg | ORAL_CAPSULE | Freq: Two times a day (BID) | ORAL | Status: DC
  Administered 2017-08-02 – 2017-08-04 (×4): 75 mg via ORAL
  Filled 2017-08-02 (×4): qty 1

## 2017-08-02 MED ORDER — LORATADINE 10 MG PO TABS
10.0000 mg | ORAL_TABLET | Freq: Every day | ORAL | Status: DC
  Administered 2017-08-02 – 2017-08-03 (×2): 10 mg via ORAL
  Filled 2017-08-02 (×2): qty 1

## 2017-08-02 MED ORDER — POTASSIUM CHLORIDE CRYS ER 20 MEQ PO TBCR
40.0000 meq | EXTENDED_RELEASE_TABLET | ORAL | Status: AC
  Administered 2017-08-02 – 2017-08-03 (×2): 40 meq via ORAL
  Filled 2017-08-02 (×3): qty 2

## 2017-08-02 MED ORDER — ENOXAPARIN SODIUM 40 MG/0.4ML ~~LOC~~ SOLN
40.0000 mg | SUBCUTANEOUS | Status: DC
  Administered 2017-08-02 – 2017-08-03 (×2): 40 mg via SUBCUTANEOUS
  Filled 2017-08-02 (×2): qty 0.4

## 2017-08-02 MED ORDER — POTASSIUM CHLORIDE 10 MEQ/100ML IV SOLN
10.0000 meq | Freq: Once | INTRAVENOUS | Status: AC
  Administered 2017-08-02: 10 meq via INTRAVENOUS
  Filled 2017-08-02: qty 100

## 2017-08-02 MED ORDER — ACETAMINOPHEN 650 MG RE SUPP: 650.0000 mg | Freq: Four times a day (QID) | RECTAL | Status: DC | PRN

## 2017-08-02 MED ORDER — ACETAMINOPHEN 325 MG PO TABS
650.0000 mg | ORAL_TABLET | Freq: Four times a day (QID) | ORAL | Status: DC | PRN
  Administered 2017-08-02 – 2017-08-03 (×2): 650 mg via ORAL
  Filled 2017-08-02 (×2): qty 2

## 2017-08-02 MED ORDER — ACETAMINOPHEN 325 MG PO TABS
325.0000 mg | ORAL_TABLET | Freq: Once | ORAL | Status: AC
  Administered 2017-08-02: 325 mg via ORAL
  Filled 2017-08-02: qty 1

## 2017-08-02 MED ORDER — SERTRALINE HCL 50 MG PO TABS
25.0000 mg | ORAL_TABLET | Freq: Every day | ORAL | Status: DC
  Administered 2017-08-03 – 2017-08-04 (×2): 25 mg via ORAL
  Filled 2017-08-02 (×2): qty 1

## 2017-08-02 MED ORDER — GABAPENTIN 300 MG PO CAPS
300.0000 mg | ORAL_CAPSULE | Freq: Three times a day (TID) | ORAL | Status: DC
  Administered 2017-08-02 – 2017-08-03 (×2): 300 mg via ORAL
  Filled 2017-08-02 (×4): qty 1

## 2017-08-02 MED ORDER — LEVOFLOXACIN IN D5W 750 MG/150ML IV SOLN
750.0000 mg | Freq: Once | INTRAVENOUS | Status: AC
  Administered 2017-08-02: 750 mg via INTRAVENOUS
  Filled 2017-08-02: qty 150

## 2017-08-02 MED ORDER — SODIUM CHLORIDE 0.9 % IV SOLN
INTRAVENOUS | Status: DC
  Administered 2017-08-03 – 2017-08-04 (×3): via INTRAVENOUS

## 2017-08-02 MED ORDER — OXYCODONE-ACETAMINOPHEN 5-325 MG PO TABS
1.0000 | ORAL_TABLET | ORAL | Status: DC | PRN
  Administered 2017-08-04 (×2): 1 via ORAL
  Filled 2017-08-02 (×2): qty 1

## 2017-08-02 MED ORDER — ONDANSETRON HCL 4 MG/2ML IJ SOLN: 4.0000 mg | Freq: Four times a day (QID) | INTRAMUSCULAR | Status: DC | PRN

## 2017-08-02 MED ORDER — SODIUM CHLORIDE 0.9 % IV SOLN
1000.0000 mL | INTRAVENOUS | Status: DC
  Administered 2017-08-02: 1000 mL via INTRAVENOUS

## 2017-08-02 MED ORDER — AZTREONAM 2 G IJ SOLR
2.0000 g | Freq: Once | INTRAMUSCULAR | Status: AC
  Administered 2017-08-02: 2 g via INTRAVENOUS
  Filled 2017-08-02: qty 2

## 2017-08-02 MED ORDER — VANCOMYCIN HCL IN DEXTROSE 1-5 GM/200ML-% IV SOLN: 1000.0000 mg | Freq: Two times a day (BID) | INTRAVENOUS | Status: DC

## 2017-08-02 MED ORDER — ONDANSETRON 4 MG PO TBDP
4.0000 mg | ORAL_TABLET | Freq: Once | ORAL | Status: AC
  Administered 2017-08-02: 4 mg via ORAL

## 2017-08-02 MED ORDER — ONDANSETRON HCL 4 MG PO TABS
4.0000 mg | ORAL_TABLET | Freq: Four times a day (QID) | ORAL | Status: DC | PRN
Start: 2017-08-02 — End: 2017-08-04
  Administered 2017-08-04: 4 mg via ORAL
  Filled 2017-08-02: qty 1

## 2017-08-02 MED ORDER — OXYCODONE HCL 5 MG PO TABS
5.0000 mg | ORAL_TABLET | ORAL | Status: DC | PRN
  Administered 2017-08-04 (×2): 5 mg via ORAL
  Filled 2017-08-02 (×2): qty 1

## 2017-08-02 MED ORDER — POLYETHYLENE GLYCOL 3350 17 G PO PACK: 17.0000 g | PACK | Freq: Every day | ORAL | Status: DC | PRN

## 2017-08-02 MED ORDER — SODIUM CHLORIDE 0.9 % IV BOLUS (SEPSIS)
500.0000 mL | Freq: Once | INTRAVENOUS | Status: AC
  Administered 2017-08-02: 500 mL via INTRAVENOUS

## 2017-08-02 MED ORDER — ACETAMINOPHEN 325 MG PO TABS: 650.0000 mg | ORAL_TABLET | Freq: Once | ORAL | Status: DC | PRN

## 2017-08-02 MED ORDER — ACETAMINOPHEN 325 MG PO TABS
650.0000 mg | ORAL_TABLET | Freq: Once | ORAL | Status: AC
  Administered 2017-08-02: 650 mg via ORAL
  Filled 2017-08-02: qty 2

## 2017-08-02 MED ORDER — VANCOMYCIN HCL IN DEXTROSE 1-5 GM/200ML-% IV SOLN
1000.0000 mg | Freq: Once | INTRAVENOUS | Status: AC
  Administered 2017-08-02: 1000 mg via INTRAVENOUS
  Filled 2017-08-02: qty 200

## 2017-08-02 MED ORDER — DIAZEPAM 5 MG PO TABS
5.0000 mg | ORAL_TABLET | Freq: Four times a day (QID) | ORAL | Status: DC | PRN
  Administered 2017-08-03: 5 mg via ORAL
  Filled 2017-08-02: qty 1

## 2017-08-02 MED ORDER — LEVOCETIRIZINE DIHYDROCHLORIDE 5 MG PO TABS: 5.0000 mg | ORAL_TABLET | Freq: Every evening | ORAL | Status: DC

## 2017-08-02 NOTE — ED Notes (Signed)
Pt reports allergy to Motrin

## 2017-08-02 NOTE — H&P (Signed)
History and Physical    Kaitlin Fleming JME:[id number] DOB: [birth year] DOA: 08/02/2017  PCP: Samuel Jester, DO   Patient coming from: Home    Chief Complaint: Fever, nausea, vomiting  HPI: Kaitlin Fleming is a 57 y.o. female with medical history significant of rheumatoid arthritis on monthly infliximab infusions, osteoporosis, degenerative disc disease status post 3 back surgeries complicated by chronic radiculopathy/neuropathy, lower extremity edema and HCTZ who comes in with fevers and nausea vomiting.  Patient reports that yesterday she began to have high fever.  She also had several episodes of nonbilious nonbloody emesis.  She reported significant amounts of anorexia during this time.  She had been started on oseltamivir prophylaxis by her PCP as a family member had influenza which she had completed 1 week ago.  She also reports a cough with some nasal congestion that started yesterday as well.  She denies any diarrhea, abdominal pain, chest pain, shortness of breath, syncope, lower extremity edema, orthopnea, PND.  ED Course: In the ED patient was febrile to T-max of 103.7.  Patient's heart rate was 124-138.  Otherwise vitals were stable.  Potassium was 2.7.  Otherwise labs were unremarkable.  Chest x-ray showed only some chronic interstitial changes.  Influenza B was positive.  Patient was admitted for hypokalemia and influenza B infection.  Apparently a code sepsis was called and patient was given empiric broad-spectrum IV antibiotics.  Review of Systems: As per HPI otherwise 10 point review of systems negative.    Past Medical History:  Diagnosis Date  . Chronic back pain    stenosis  . Chronic pain    takes Lexapro daily  . Complication of anesthesia    pt states after surgery in 2000 had to wear a heart monitor for 3 days.  . DDD (degenerative disc disease)   . Depression   . GERD (gastroesophageal reflux disease)    takes OTC meds if needed  . History of blood transfusion     as a child  . History of bronchitis 57yrs ago  . Hyperlipidemia    takes Fenofibrate daily  . Joint pain   . Joint swelling   . Kidney stone    has one right now on the right side but not giving any problems  . Osteoarthritis   . Peripheral edema    takes HCTZ daily   . Pneumonia 60yrs ago   hx of  . PONV (postoperative nausea and vomiting)   . Rheumatoid arthritis (HCC)   . Weakness    and tingling on left side    Past Surgical History:  Procedure Laterality Date  . ABDOMINAL HYSTERECTOMY  2000  . BACK SURGERY  03/10/14   lumbar fusion  . LUMBAR LAMINECTOMY/DECOMPRESSION MICRODISCECTOMY Left 12/08/2013   Procedure: LEFT LUMBAR FOUR-FIVEW, LUMBAR FIVE-SACRAL ONE LAMINECTOMY;  Surgeon: Karn Cassis, MD;  Location: [facility];  Service: Neurosurgery;  Laterality: Left;  . TUBAL LIGATION  24 yrs ago     reports that she has never smoked. She has never used smokeless tobacco. She reports that she does not drink alcohol or use drugs.  Allergies  Allergen Reactions  . Lotensin [Benazepril Hcl]     Face turns red swelling of face  . Plaquenil [Hydroxychloroquine Sulfate] Nausea And Vomiting and Other (See Comments)    GI UPSET  . Doxycycline Other (See Comments)    blisters  . Ibuprofen Hives  . Penicillins Other (See Comments)    Breaks out  . Sulfa Antibiotics Other (See Comments)  Breaks out    No family history on file.   Prior to Admission medications   Medication Sig Start Date End Date Taking? Authorizing Provider  alendronate (FOSAMAX) 70 MG tablet Take 1 tablet by mouth once a week. 07/17/16   [provider]  ciprofloxacin (CIPRO) 500 MG tablet Take 1 tablet (500 mg total) by mouth 2 (two) times daily. 09/01/16   Vanetta Mulders, MD  diazepam (VALIUM) 5 MG tablet Take 5 mg by mouth every 6 (six) hours as needed for muscle spasms.    [provider]  gabapentin (NEURONTIN) 300 MG capsule Take 300 mg by mouth 3 (three) times daily.      [provider]  hydrochlorothiazide (HYDRODIURIL) 25 MG tablet Take 25 mg by mouth daily.    [provider]  HYDROmorphone (DILAUDID) 4 MG tablet Take one tab po q 4-6 hrs prn pain Patient not taking: Reported on 09/01/2016 03/26/14   Triplett, Tammy, PA-C  levocetirizine (XYZAL) 5 MG tablet Take 5 mg by mouth every evening.    [provider]  ondansetron (ZOFRAN ODT) 4 MG disintegrating tablet Take 1 tablet (4 mg total) by mouth every 8 (eight) hours as needed for nausea or vomiting. Patient not taking: Reported on 06/18/2014 05/26/14   Teressa Lower, NP  ondansetron (ZOFRAN) 8 MG tablet Take 1 tablet (8 mg total) by mouth every 8 (eight) hours as needed for nausea. Patient not taking: Reported on 09/01/2016 07/19/14   Burgess Amor, PA-C  oxyCODONE-acetaminophen (ENDOCET) 10-325 MG tablet Take 1 tablet by mouth every 4 (four) hours as needed for pain. 02/13/15   Teressa Lower, NP  potassium chloride SA (K-DUR,KLOR-CON) 20 MEQ tablet Take 1 tablet (20 mEq total) by mouth daily. Patient not taking: Reported on 09/01/2016 05/11/14   Lula Olszewski, MD  sertraline (ZOLOFT) 25 MG tablet Take 50 tablets by mouth daily.     [provider]  sulfamethoxazole-trimethoprim (BACTRIM DS,SEPTRA DS) 800-160 MG per tablet Take 1 tablet by mouth 2 (two) times daily. For 10 days, starting 2/29    [provider]  Tofacitinib Citrate (XELJANZ) 5 MG TABS Take 1 tablet by mouth daily.    [provider]  Vitamin D, Ergocalciferol, (DRISDOL) 50000 units CAPS capsule Take 1 capsule by mouth once a week. 07/24/16   [provider]    Physical Exam: Vitals:   08/02/17 1610 08/02/17 1630 08/02/17 1725 08/02/17 1730  BP:  122/67  107/74  Pulse:      Resp:  (!) 25  20  Temp: (!) 103.7 F (39.8 C)  (!) 102.6 F (39.2 C)   TempSrc: Oral  Oral   SpO2:      Weight:      Height:        Constitutional: NAD, calm, comfortable Vitals:   08/02/17 1610  08/02/17 1630 08/02/17 1725 08/02/17 1730  BP:  122/67  107/74  Pulse:      Resp:  (!) 25  20  Temp: (!) 103.7 F (39.8 C)  (!) 102.6 F (39.2 C)   TempSrc: Oral  Oral   SpO2:      Weight:      Height:       Eyes: Anicteric sclera ENMT: Dry mucous membranes Neck: normal, supple, no masses, no thyromegaly Respiratory: clear to auscultation bilaterally, no wheezing, no crackles. Normal respiratory effort. No accessory muscle use.  Cardiovascular: Tachycardia, regular rhythm, no murmurs Abdomen: no tenderness, no masses palpated. No hepatosplenomegaly. Bowel sounds positive.  Musculoskeletal: No lower extremity edema Skin: no rashes on visible skin Neurologic: Grossly intact moving all extremities.  Psychiatric: Normal judgment and insight. Alert and oriented x 3. Normal mood.   Labs on Admission: I have personally reviewed following labs and imaging studies  CBC: Recent Labs  Lab 08/02/17 1534  WBC 5.6  NEUTROABS 4.0  HGB 13.2  HCT 42.5  MCV 84.8  PLT 175   Basic Metabolic Panel: Recent Labs  Lab 08/02/17 1534  NA 136  K 2.7*  CL 95*  CO2 29  GLUCOSE 97  BUN 7  CREATININE 0.73  CALCIUM 9.0   GFR: Estimated Creatinine Clearance: 84.4 mL/min (by C-G formula based on SCr of 0.73 mg/dL). Liver Function Tests: Recent Labs  Lab 08/02/17 1534  AST 20  ALT 18  ALKPHOS 87  BILITOT 0.7  PROT 7.5  ALBUMIN 3.6   No results for input(s): LIPASE, AMYLASE in the last 168 hours. No results for input(s): AMMONIA in the last 168 hours. Coagulation Profile: Recent Labs  Lab 08/02/17 1534  INR 0.98   Cardiac Enzymes: No results for input(s): CKTOTAL, CKMB, CKMBINDEX, TROPONINI in the last 168 hours. BNP (last 3 results) No results for input(s): PROBNP in the last 8760 hours. HbA1C: No results for input(s): HGBA1C in the last 72 hours. CBG: No results for input(s): GLUCAP in the last 168 hours. Lipid Profile: No results for input(s): CHOL, HDL, LDLCALC, TRIG,  CHOLHDL, LDLDIRECT in the last 72 hours. Thyroid Function Tests: No results for input(s): TSH, T4TOTAL, FREET4, T3FREE, THYROIDAB in the last 72 hours. Anemia Panel: No results for input(s): VITAMINB12, FOLATE, FERRITIN, TIBC, IRON, RETICCTPCT in the last 72 hours. Urine analysis:    Component Value Date/Time   COLORURINE YELLOW 09/01/2016 2055   APPEARANCEUR HAZY (A) 09/01/2016 2055   LABSPEC 1.015 09/01/2016 2055   PHURINE 5.0 09/01/2016 2055   GLUCOSEU NEGATIVE 09/01/2016 2055   HGBUR MODERATE (A) 09/01/2016 2055   BILIRUBINUR NEGATIVE 09/01/2016 2055   KETONESUR NEGATIVE 09/01/2016 2055   PROTEINUR NEGATIVE 09/01/2016 2055   UROBILINOGEN 0.2 07/19/2014 1148   NITRITE POSITIVE (A) 09/01/2016 2055   LEUKOCYTESUR SMALL (A) 09/01/2016 2055    Radiological Exams on Admission: Dg Chest 2 View  Result Date: 08/02/2017 CLINICAL DATA:  Right chest pain for the past 1-2 days. Nausea and vomiting since this morning. EXAM: CHEST - 2 VIEW COMPARISON:  Chest radiographs dated 07/04/2011 and chest CTA dated 05/11/2014. FINDINGS: Stable elevation of the right hemidiaphragm. Normal sized heart. Clear lungs. Mild peribronchial thickening. Minimal thoracic spine degenerative changes IMPRESSION: Mild bronchitic changes with progression. Electronically Signed   By: Beckie Salts M.D.   On: 08/02/2017 14:37    EKG: Independently reviewed. Pending  Assessment/Plan Principal Problem:   Influenza due to influenza virus, type B Active Problems:   Lumbar radiculopathy, chronic   Rheumatoid arthritis (HCC)    #) influenza B infection: We will patient does meet sepsis criteria this is almost certainly due to a viral influenza infection.  She does not need empiric broad-spectrum IV antibiotics or a fluid bolus at this time as she has no other clear source with superimposed infection on top of the pneumonia that is bacterial. -Continue oseltamivir 75 mill grams twice daily -Droplet  precautions  #)hypokalemia: With mild hyperchloremia suspect this is likely in the setting of GI losses with nausea vomiting as well as p.o. intake.  As well as HCTZ. -Replace aggressively  #) Rheumatoid arthritis: -Patient is due for next anti-TNF  inhibitor infusion on 08/05/2017, advised patient not to go for this is she is still going to be under treatment for influenza  #) Pain/psych: -Continue sertraline 25 mg daily -Continue gabapentin 300 mg 3 times daily -Continue diazepam 5 mg every 6 hours as needed for muscle spasms -Continue PRN Percocet 10-3 25 every 4 hours as needed for pain  #) Sees osteoporosis: -Hold alendronate 70 mg weekly -Continue Drisdol 50,000 units weekly  Fluids: IV fluids Elect lites: Monitor and supplement Nutrition: Regular diet next  Prophylaxis: Enoxaparin  Disposition: Pending treatment of dehydration and normalization of electrolytes  Full code   Delaine Lame MD [facility]   If 7PM-7AM, please contact night-coverage [email or web address] Password [facility]  08/02/2017, 6:32 PM

## 2017-08-02 NOTE — ED Triage Notes (Signed)
Patient c/o fever with cough x2 days. Per patient productive cough with thick yellow sputum. Patient also states body aches, nausea, vomiting, and diarrhea that started today. Patient states temp 103.6 today, took tylenol 1 hour ago.

## 2017-08-02 NOTE — ED Provider Notes (Signed)
[facility] EMERGENCY DEPARTMENT Provider Note   CSN: [id number] Arrival date & time: 08/02/17  1405     History   Chief Complaint Chief Complaint  Patient presents with  . Fever    HPI Kaitlin Fleming is a 57 y.o. female.  Patient presents with a 2-day history of fever and cough productive of thick yellow sputum associated with body aches vomiting x3 today no diarrhea.  Patient was exposed to family member with flu 2 weeks ago and did take a course of Tamiflu which ended a week ago.  Patient did not have the flu vaccine this year.  Patient denies any blood in the vomit.     Past Medical History:  Diagnosis Date  . Chronic back pain    stenosis  . Chronic pain    takes Lexapro daily  . Complication of anesthesia    pt states after surgery in 2000 had to wear a heart monitor for 3 days.  . DDD (degenerative disc disease)   . Depression   . GERD (gastroesophageal reflux disease)    takes OTC meds if needed  . History of blood transfusion    as a child  . History of bronchitis 9yrs ago  . Hyperlipidemia    takes Fenofibrate daily  . Joint pain   . Joint swelling   . Kidney stone    has one right now on the right side but not giving any problems  . Osteoarthritis   . Peripheral edema    takes HCTZ daily   . Pneumonia 5yrs ago   hx of  . PONV (postoperative nausea and vomiting)   . Rheumatoid arthritis (HCC)   . Weakness    and tingling on left side    Patient Active Problem List   Diagnosis Date Noted  . Lumbar radiculopathy, chronic 06/22/2014  . Lumbar degenerative disc disease 03/10/2014  . Lumbar stenosis without neurogenic claudication 12/08/2013    Past Surgical History:  Procedure Laterality Date  . ABDOMINAL HYSTERECTOMY  2000  . BACK SURGERY  03/10/14   lumbar fusion  . LUMBAR LAMINECTOMY/DECOMPRESSION MICRODISCECTOMY Left 12/08/2013   Procedure: LEFT LUMBAR FOUR-FIVEW, LUMBAR FIVE-SACRAL ONE LAMINECTOMY;  Surgeon: Karn Cassis, MD;   Location: [facility];  Service: Neurosurgery;  Laterality: Left;  . TUBAL LIGATION  24 yrs ago     OB History   None      Home Medications    Prior to Admission medications   Medication Sig Start Date End Date Taking? Authorizing Provider  alendronate (FOSAMAX) 70 MG tablet Take 1 tablet by mouth once a week. 07/17/16   [provider]  ciprofloxacin (CIPRO) 500 MG tablet Take 1 tablet (500 mg total) by mouth 2 (two) times daily. 09/01/16   Vanetta Mulders, MD  diazepam (VALIUM) 5 MG tablet Take 5 mg by mouth every 6 (six) hours as needed for muscle spasms.    [provider]  gabapentin (NEURONTIN) 300 MG capsule Take 300 mg by mouth 3 (three) times daily.     [provider]  hydrochlorothiazide (HYDRODIURIL) 25 MG tablet Take 25 mg by mouth daily.    [provider]  HYDROmorphone (DILAUDID) 4 MG tablet Take one tab po q 4-6 hrs prn pain Patient not taking: Reported on 09/01/2016 03/26/14   Triplett, Tammy, PA-C  levocetirizine (XYZAL) 5 MG tablet Take 5 mg by mouth every evening.    [provider]  ondansetron (ZOFRAN ODT) 4 MG disintegrating tablet Take 1 tablet (  4 mg total) by mouth every 8 (eight) hours as needed for nausea or vomiting. Patient not taking: Reported on 06/18/2014 05/26/14   Teressa Lower, NP  ondansetron (ZOFRAN) 8 MG tablet Take 1 tablet (8 mg total) by mouth every 8 (eight) hours as needed for nausea. Patient not taking: Reported on 09/01/2016 07/19/14   Burgess Amor, PA-C  oxyCODONE-acetaminophen (ENDOCET) 10-325 MG tablet Take 1 tablet by mouth every 4 (four) hours as needed for pain. 02/13/15   Teressa Lower, NP  potassium chloride SA (K-DUR,KLOR-CON) 20 MEQ tablet Take 1 tablet (20 mEq total) by mouth daily. Patient not taking: Reported on 09/01/2016 05/11/14   Lula Olszewski, MD  sertraline (ZOLOFT) 25 MG tablet Take 50 tablets by mouth daily.     [provider]  sulfamethoxazole-trimethoprim (BACTRIM  DS,SEPTRA DS) 800-160 MG per tablet Take 1 tablet by mouth 2 (two) times daily. For 10 days, starting 2/29    [provider]  Tofacitinib Citrate (XELJANZ) 5 MG TABS Take 1 tablet by mouth daily.    [provider]  Vitamin D, Ergocalciferol, (DRISDOL) 50000 units CAPS capsule Take 1 capsule by mouth once a week. 07/24/16   [provider]    Family History No family history on file.  Social History Social History   Tobacco Use  . Smoking status: Never Smoker  . Smokeless tobacco: Never Used  Substance Use Topics  . Alcohol use: No  . Drug use: No     Allergies   Lotensin [benazepril hcl]; Plaquenil [hydroxychloroquine sulfate]; Doxycycline; Ibuprofen; Penicillins; and Sulfa antibiotics   Review of Systems Review of Systems  Constitutional: Positive for fever.  HENT: Positive for congestion.   Eyes: Negative for redness.  Respiratory: Positive for shortness of breath.   Cardiovascular: Positive for chest pain.  Gastrointestinal: Positive for abdominal pain, nausea and vomiting. Negative for diarrhea.  Genitourinary: Negative for dysuria.  Musculoskeletal: Positive for myalgias.  Skin: Negative for rash.  Neurological: Negative for headaches.  Hematological: Does not bruise/bleed easily.  Psychiatric/Behavioral: Negative for confusion.     Physical Exam Updated Vital Signs BP 107/74   Pulse (!) 124   Temp (!) 102.6 F (39.2 C) (Oral)   Resp 20   Ht 1.702 m (5\' 7" )   Wt 79.8 kg (176 lb)   SpO2 90%   BMI 27.57 kg/m   Physical Exam  Constitutional: She is oriented to person, place, and time. She appears well-developed and well-nourished. She appears distressed.  HENT:  Head: Normocephalic and atraumatic.  Mucous membranes dry.  Eyes: Pupils are equal, round, and reactive to light. Conjunctivae and EOM are normal.  Neck: Neck supple.  Cardiovascular: Regular rhythm.  Tachycardic  Pulmonary/Chest: Effort normal and breath sounds  normal. No respiratory distress. She has no wheezes.  Abdominal: Soft. Bowel sounds are normal. There is no tenderness.  Musculoskeletal: Normal range of motion.  Neurological: She is alert and oriented to person, place, and time. No cranial nerve deficit or sensory deficit. She exhibits normal muscle tone. Coordination normal.  Skin: Skin is warm. No rash noted.  Nursing note and vitals reviewed.    ED Treatments / Results  Labs (all labs ordered are listed, but only abnormal results are displayed) Labs Reviewed  INFLUENZA PANEL BY PCR (TYPE A & B) - Abnormal; Notable for the following components:      Result Value   Influenza B By PCR POSITIVE (*)    All other components within normal limits  COMPREHENSIVE METABOLIC PANEL -  Abnormal; Notable for the following components:   Potassium 2.7 (*)    Chloride 95 (*)    All other components within normal limits  CULTURE, BLOOD (ROUTINE X 2)  CULTURE, BLOOD (ROUTINE X 2)  CBC WITH DIFFERENTIAL/PLATELET  PROTIME-INR  URINALYSIS, ROUTINE W REFLEX MICROSCOPIC  BASIC METABOLIC PANEL  I-STAT CG4 LACTIC ACID, ED  I-STAT CG4 LACTIC ACID, ED    EKG None  EKG Interpretation  Date/Time:    Ventricular Rate:    PR Interval:    QRS Duration:   QT Interval:    QTC Calculation:   R Axis:     Text Interpretation:       EKG ordered but has not been completed yet.  Not complete prior to the time they were admitted by the hospitalist.  Radiology Dg Chest 2 View  Result Date: 08/02/2017 CLINICAL DATA:  Right chest pain for the past 1-2 days. Nausea and vomiting since this morning. EXAM: CHEST - 2 VIEW COMPARISON:  Chest radiographs dated 07/04/2011 and chest CTA dated 05/11/2014. FINDINGS: Stable elevation of the right hemidiaphragm. Normal sized heart. Clear lungs. Mild peribronchial thickening. Minimal thoracic spine degenerative changes IMPRESSION: Mild bronchitic changes with progression. Electronically Signed   By: Beckie Salts M.D.    On: 08/02/2017 14:37    Procedures Procedures (including critical care time). CRITICAL CARE Performed by: Vanetta Mulders Total critical care time: 30 minutes Critical care time was exclusive of separately billable procedures and treating other patients. Critical care was necessary to treat or prevent imminent or life-threatening deterioration. Critical care was time spent personally by me on the following activities: development of treatment plan with patient and/or surrogate as well as nursing, discussions with consultants, evaluation of patient's response to treatment, examination of patient, obtaining history from patient or surrogate, ordering and performing treatments and interventions, ordering and review of laboratory studies, ordering and review of radiographic studies, pulse oximetry and re-evaluation of patient's condition.    Medications Ordered in ED Medications  ondansetron (ZOFRAN-ODT) 4 MG disintegrating tablet (has no administration in time range)  0.9 %  sodium chloride infusion (1,000 mLs Intravenous New Bag/Given 08/02/17 1738)  vancomycin (VANCOCIN) IVPB 1000 mg/200 mL premix (has no administration in time range)  potassium chloride 10 mEq in 100 mL IVPB (10 mEq Intravenous New Bag/Given 08/02/17 1736)  potassium chloride 10 mEq in 100 mL IVPB (has no administration in time range)  ondansetron (ZOFRAN-ODT) disintegrating tablet 4 mg (4 mg Oral Given 08/02/17 1418)  levofloxacin (LEVAQUIN) IVPB 750 mg (750 mg Intravenous New Bag/Given 08/02/17 1624)  aztreonam (AZACTAM) 2 g in sodium chloride 0.9 % 100 mL IVPB (0 g Intravenous Stopped 08/02/17 1702)  vancomycin (VANCOCIN) IVPB 1000 mg/200 mL premix (1,000 mg Intravenous New Bag/Given 08/02/17 1705)  sodium chloride 0.9 % bolus 500 mL (0 mLs Intravenous Stopped 08/02/17 1737)  acetaminophen (TYLENOL) tablet 650 mg (650 mg Oral Given 08/02/17 1726)     Initial Impression / Assessment and Plan / ED Course  I have reviewed the  triage vital signs and the nursing notes.  Pertinent labs & imaging results that were available during my care of the patient were reviewed by me and considered in my medical decision making (see chart for details).    Patient arrived with vital signs consistent with possible sepsis.  Significant fever tachycardia.  Workup is positive for influenza B.  Also hypokalemia.  Patient received IV potassium 10 mEq x2 since she is having trouble with nausea and vomiting.  Patient  also received IV fluids.  Because of the sepsis concern was started on sepsis protocol.  Patient received protocol antibiotics but probably does not need them based on the workup.  Chest x-ray negative for pneumonia.  Lactic acid was less than 2.  Blood cultures are pending.  Patient also started on Tamiflu.  Contacted hospitalist who will admit.  Patient with some improvement with IV fluids.   Final Clinical Impressions(s) / ED Diagnoses   Final diagnoses:  Influenza  Hypokalemia    ED Discharge Orders    None       Vanetta Mulders, MD 08/02/17 1910

## 2017-08-02 NOTE — ED Notes (Signed)
Date and time results received: 08/02/17 4:35 PM  (use smartphrase ".now" to insert current time)  Test: K+ Critical Value: 2.7  Name of Provider Notified: Zackowski  Orders Received? Or Actions Taken?: Orders Received - See Orders for details

## 2017-08-02 NOTE — Progress Notes (Signed)
Pharmacy Note:  Initial antibiotic(s) regimen of Vancomycin, Aztreonam, and Levaquin ordered by EDP to treat sepsis.  Estimated Creatinine Clearance: 84.4 mL/min (by C-G formula based on SCr of 0.73 mg/dL).   Allergies  Allergen Reactions  . Lotensin [Benazepril Hcl]     Face turns red swelling of face  . Plaquenil [Hydroxychloroquine Sulfate] Nausea And Vomiting and Other (See Comments)    GI UPSET  . Doxycycline Other (See Comments)    blisters  . Ibuprofen Hives  . Penicillins Other (See Comments)    Breaks out  . Sulfa Antibiotics Other (See Comments)    Breaks out    Vitals:   08/02/17 1610 08/02/17 1630  BP:  122/67  Pulse:    Resp:  (!) 25  Temp: (!) 103.7 F (39.8 C)   SpO2:      Anti-infectives (From admission, onward)   Start     Dose/Rate Route Frequency Ordered Stop   08/03/17 0500  vancomycin (VANCOCIN) IVPB 1000 mg/200 mL premix     1,000 mg 200 mL/hr over 60 Minutes Intravenous Every 12 hours 08/02/17 1657     08/02/17 1600  levofloxacin (LEVAQUIN) IVPB 750 mg     750 mg 100 mL/hr over 90 Minutes Intravenous  Once 08/02/17 1546     08/02/17 1600  aztreonam (AZACTAM) 2 g in sodium chloride 0.9 % 100 mL IVPB     2 g 200 mL/hr over 30 Minutes Intravenous  Once 08/02/17 1546 08/02/17 1657   08/02/17 1600  vancomycin (VANCOCIN) IVPB 1000 mg/200 mL premix     1,000 mg 200 mL/hr over 60 Minutes Intravenous  Once 08/02/17 1546        Plan: Initial dose(s) of Vancomycin 1000mg , Aztreonam 2gm, and Levaquin 750mg  X 1 ordered. F/U admission orders for further dosing if therapy continued.  , [facility] 08/02/2017 4:57 PM

## 2017-08-03 ENCOUNTER — Other Ambulatory Visit: Payer: Self-pay

## 2017-08-03 DIAGNOSIS — J101 Influenza due to other identified influenza virus with other respiratory manifestations: Secondary | ICD-10-CM | POA: Diagnosis not present

## 2017-08-03 DIAGNOSIS — E876 Hypokalemia: Secondary | ICD-10-CM | POA: Diagnosis not present

## 2017-08-03 DIAGNOSIS — M069 Rheumatoid arthritis, unspecified: Secondary | ICD-10-CM

## 2017-08-03 DIAGNOSIS — F329 Major depressive disorder, single episode, unspecified: Secondary | ICD-10-CM

## 2017-08-03 DIAGNOSIS — M5416 Radiculopathy, lumbar region: Secondary | ICD-10-CM

## 2017-08-03 LAB — BASIC METABOLIC PANEL
Anion gap: 8 (ref 5–15)
BUN: 6 mg/dL (ref 6–20)
CO2: 25 mmol/L (ref 22–32)
Calcium: 7.5 mg/dL — ABNORMAL LOW (ref 8.9–10.3)
Chloride: 101 mmol/L (ref 101–111)
Creatinine, Ser: 0.57 mg/dL (ref 0.44–1.00)
GFR calc Af Amer: >60 mL/min (ref >60)
GFR calc non Af Amer: >60 mL/min (ref >60)
Glucose, Bld: 103 mg/dL — ABNORMAL HIGH (ref 65–99)
Potassium: 3.3 mmol/L — ABNORMAL LOW (ref 3.5–5.1)
Sodium: 134 mmol/L — ABNORMAL LOW (ref 135–145)

## 2017-08-03 LAB — MAGNESIUM: Magnesium: 1.4 mg/dL — ABNORMAL LOW (ref 1.7–2.4)

## 2017-08-03 MED ORDER — SODIUM CHLORIDE 0.9 % IV BOLUS (SEPSIS)
500.0000 mL | Freq: Once | INTRAVENOUS | Status: AC
  Administered 2017-08-03: 500 mL via INTRAVENOUS

## 2017-08-03 MED ORDER — POTASSIUM CHLORIDE CRYS ER 20 MEQ PO TBCR
40.0000 meq | EXTENDED_RELEASE_TABLET | ORAL | Status: AC
  Administered 2017-08-03 (×2): 40 meq via ORAL
  Filled 2017-08-03 (×2): qty 2

## 2017-08-03 MED ORDER — IBUPROFEN 600 MG PO TABS
600.0000 mg | ORAL_TABLET | Freq: Three times a day (TID) | ORAL | Status: DC | PRN
  Administered 2017-08-03: 600 mg via ORAL
  Filled 2017-08-03: qty 1

## 2017-08-03 MED ORDER — MAGNESIUM SULFATE 2 GM/50ML IV SOLN
2.0000 g | Freq: Once | INTRAVENOUS | Status: AC
  Administered 2017-08-03: 2 g via INTRAVENOUS
  Filled 2017-08-03: qty 50

## 2017-08-03 NOTE — Progress Notes (Signed)
[facility] PROGRESS NOTE  Kaitlin Fleming OIN:[id number] DOB: 1961-02-15 DOA: 08/02/2017 PCP: Samuel Jester, DO   Summary and HPI: 57 y/o female with history of Rheumatoid Arthritis, on infliximab infusions, osteoporosis, chronic radiculopathy/neuropathy, degenerative disc disease status post 3 back surgeries, comes to the hospital with fevers (up to 103.2), nausea and vomiting. Patient has a positive influenza B test.  Assessment/Plan: 1. Influenza type B -continue tamiflu - Fever- not resolving with acetaminophen. Ibuprofen ordered (despite history of hives reaction to liquid ibuprofen more than 20 years ago that patient says that she was told it was likely due to the dye in the liquid medication. Patient also said she had advil liquid gels since then, and had no reaction). -Continue IV fluids and supportive care.  2. Hypokalemia/Hypomagnesemia - Patient was found to have a potassium level of 2.7 yesterday (3/22), likely due to dehydration because of the flu. After repletion it went up to 3.3, still below the normal range. - Will give 2 more doses of K-dur 40 meq, follow B-med in am - Magnesium was also found to be below the normal range (at 1.4), also likely due to dehydration and poor PO intake with active flu infection. - Will replete as well, follow trend in am  3. Nausea and vomiting - improved -continue PRN Zofran  4. Rheumatoid Arthritis - patient appears well controlled with no complaints of joint pains. - Currently on infliximab, prednisone, and neurontin.  - Continue follow up with Rheumatology as an outpatient.   5. Chronic pain - due to history of 3 back surgeries - continue current PRN analgesics regimen  6. Depression -mood stable, no suicidal ideation, no hallucinations -continue Zoloft 25mg  daily  Code Status: FULL Family Communication: None at bedside Disposition Plan:  Replete electrolytes. Continue IV fluids and tamiflu. Control fever. Probably  discharge tomorrow if electrolytes are stable and patient afebrile.   Consultants:  none  Procedures:  none  Antibiotics:  none  HPI/Subjective: Patient reports she feels better than yesterday. Still has chest pain (likely due to coughing). Denies nausea, vomiting or abdominal pain. Feeling hot.  Objective: Vitals:   08/03/17 1418 08/03/17 1419  BP:  (!) 117/57  Pulse:  80  Resp:  20  Temp: 98.9 F (37.2 C)   SpO2:  98%    Intake/Output Summary (Last 24 hours) at 08/03/2017 1724 Last data filed at 08/03/2017 1100 Gross per 24 hour  Intake 2909.17 ml  Output -  Net 2909.17 ml   Filed Weights   08/02/17 1413  Weight: 79.8 kg (176 lb)    Exam:   General:  Patient febrile, hot to touch, lying in bed, answering questions coherently. Reports chest pain, probably due to coughing. Denies nausea/vomiting, dizziness. Tolerating diet.  Cardiovascular: RRR. S1 and S2 heard. No murmurs, gallops or rubs, no JVD  Respiratory: Chest clear to auscultation. No wheezing, crackles or rhonchi.   Abdomen: Soft. Non distended. Non tender to palpation. Positive BS x 4 quadrants.   Musculoskeletal: Pulses full throughout. 5/5 strength in all four extremities.   Data Reviewed: Basic Metabolic Panel: Recent Labs  Lab 08/02/17 1534 08/03/17 0656  NA 136 134*  K 2.7* 3.3*  CL 95* 101  CO2 29 25  GLUCOSE 97 103*  BUN 7 6  CREATININE 0.73 0.57  CALCIUM 9.0 7.5*  MG  --  1.4*   Liver Function Tests: Recent Labs  Lab 08/02/17 1534  AST 20  ALT 18  ALKPHOS 87  BILITOT 0.7  PROT 7.5  ALBUMIN 3.6   CBC: Recent Labs  Lab 08/02/17 1534  WBC 5.6  NEUTROABS 4.0  HGB 13.2  HCT 42.5  MCV 84.8  PLT 175   Cardiac Enzymes: Recent Labs  Lab 08/02/17 1534  TROPONINI <0.03     Recent Results (from the past 240 hour(s))  Culture, blood (Routine x 2)     Status: None (Preliminary result)   Collection Time: 08/02/17  3:35 PM  Result Value Ref Range Status   Specimen  Description   Final    RIGHT ANTECUBITAL BOTTLES DRAWN AEROBIC AND ANAEROBIC   Special Requests Blood Culture adequate volume  Final   Culture   Final    NO GROWTH < 24 HOURS Performed at [facility], [address] [place], [place] [address]    Report Status PENDING  Incomplete  Culture, blood (Routine x 2)     Status: None (Preliminary result)   Collection Time: 08/02/17  3:35 PM  Result Value Ref Range Status   Specimen Description   Final    LEFT ANTECUBITAL BOTTLES DRAWN AEROBIC AND ANAEROBIC   Special Requests Blood Culture adequate volume  Final   Culture   Final    NO GROWTH < 24 HOURS Performed at [facility], [address] [place], [place] [address]    Report Status PENDING  Incomplete     Studies: Dg Chest 2 View  Result Date: 08/02/2017 CLINICAL DATA:  Right chest pain for the past 1-2 days. Nausea and vomiting since this morning. EXAM: CHEST - 2 VIEW COMPARISON:  Chest radiographs dated 07/04/2011 and chest CTA dated 05/11/2014. FINDINGS: Stable elevation of the right hemidiaphragm. Normal sized heart. Clear lungs. Mild peribronchial thickening. Minimal thoracic spine degenerative changes IMPRESSION: Mild bronchitic changes with progression. Electronically Signed   By: Beckie Salts M.D.   On: 08/02/2017 14:37    Scheduled Meds: . enoxaparin (LOVENOX) injection  40 mg Subcutaneous Q24H  . gabapentin  300 mg Oral TID  . loratadine  10 mg Oral QHS  . oseltamivir  75 mg Oral BID  . potassium chloride  40 mEq Oral Q4H  . sertraline  25 mg Oral Daily  . Vitamin D (Ergocalciferol)  50,000 Units Oral Weekly   Continuous Infusions: . sodium chloride 125 mL/hr at 08/03/17 0734    Time spent: 25 minutes    Vassie Loll  [facility] Pager [phone]. If 7PM-7AM, please contact night-coverage at [email or web address], password [facility] 08/03/2017, 5:24 PM  LOS: 0 days

## 2017-08-03 NOTE — Progress Notes (Signed)
Patient's temperature elevated throughout night, Tylenol provided once without relief. MD made aware. Tylenol provided again. Also began placing cool compresses. Temperature elevation continued and BP decreased, MD made aware. Orders placed and followed. BP now WNL and temperature only slightly elevated, will continue to monitor.

## 2017-08-04 DIAGNOSIS — M5416 Radiculopathy, lumbar region: Secondary | ICD-10-CM | POA: Diagnosis not present

## 2017-08-04 DIAGNOSIS — M069 Rheumatoid arthritis, unspecified: Secondary | ICD-10-CM | POA: Diagnosis not present

## 2017-08-04 DIAGNOSIS — J101 Influenza due to other identified influenza virus with other respiratory manifestations: Secondary | ICD-10-CM | POA: Diagnosis not present

## 2017-08-04 DIAGNOSIS — E876 Hypokalemia: Secondary | ICD-10-CM | POA: Diagnosis not present

## 2017-08-04 LAB — BASIC METABOLIC PANEL
Anion gap: 6 (ref 5–15)
BUN: <5 mg/dL — ABNORMAL LOW (ref 6–20)
CO2: 25 mmol/L (ref 22–32)
Calcium: 7.9 mg/dL — ABNORMAL LOW (ref 8.9–10.3)
Chloride: 108 mmol/L (ref 101–111)
Creatinine, Ser: 0.48 mg/dL (ref 0.44–1.00)
GFR calc Af Amer: >60 mL/min (ref >60)
GFR calc non Af Amer: >60 mL/min (ref >60)
Glucose, Bld: 90 mg/dL (ref 65–99)
Potassium: 3.5 mmol/L (ref 3.5–5.1)
Sodium: 139 mmol/L (ref 135–145)

## 2017-08-04 MED ORDER — BENZONATATE 100 MG PO CAPS
200.0000 mg | ORAL_CAPSULE | Freq: Three times a day (TID) | ORAL | Status: DC | PRN
  Administered 2017-08-04: 200 mg via ORAL
  Filled 2017-08-04: qty 2

## 2017-08-04 MED ORDER — OSELTAMIVIR PHOSPHATE 75 MG PO CAPS: 75.0000 mg | ORAL_CAPSULE | Freq: Two times a day (BID) | ORAL | 0 refills | Status: AC

## 2017-08-04 MED ORDER — VITAMIN D (ERGOCALCIFEROL) 1.25 MG (50000 UNIT) PO CAPS: 50000.0000 [IU] | ORAL_CAPSULE | ORAL | Status: DC

## 2017-08-04 MED ORDER — LORATADINE 10 MG PO TABS: 10.0000 mg | ORAL_TABLET | Freq: Every day | ORAL | 1 refills | Status: AC

## 2017-08-04 MED ORDER — BENZONATATE 200 MG PO CAPS: 200.0000 mg | ORAL_CAPSULE | Freq: Three times a day (TID) | ORAL | 0 refills | Status: DC | PRN

## 2017-08-04 NOTE — Discharge Summary (Signed)
Physician Discharge Summary  Kaitlin Fleming PYP:[id number] DOB: April 24, 1961 DOA: 08/02/2017  PCP: Samuel Jester, DO  Admit date: 08/02/2017 Discharge date: 08/04/2017  Time spent: 35 minutes  Recommendations for Outpatient Follow-up:  Repeat basic metabolic panel to follow electrolytes and renal function. Reassess complete resolution of patient symptoms.  Discharge Diagnoses:  Principal Problem:   Influenza due to influenza virus, type B Active Problems:   Lumbar radiculopathy, chronic   Rheumatoid arthritis (HCC)   Influenza B   Discharge Condition: Stable and improved.  Patient discharged home with instruction to follow-up with PCP in 10 days.  Diet recommendation: Regular diet.   Filed Weights   08/02/17 1413  Weight: 79.8 kg (176 lb)    Brief History of present illness:  57 y/o female with history of Rheumatoid Arthritis, on infliximab infusions, osteoporosis, chronic radiculopathy/neuropathy, degenerative disc disease status post 3 back surgeries, comes to the hospital with fevers (up to 103.2), nausea and vomiting. Patient has a positive influenza B test.  Hospital Course:  1. Influenza type B -continue tamiflu -afebrile for 24 hours at time of discharge -advise to continue use of tylenol/ibuprofen as needed for symptoms control and comfort  -instructed to keep herself well hydrated   2. Hypokalemia/Hypomagnesemia -in the setting of poor intake and GI loses  -repleted and WNL at discharge -advise to maintain adequate hydration and nutrition   3. Nausea and vomiting -improved/resolved -continue PRN Zofran  4. Rheumatoid Arthritis - patient appears well controlled with no complaints of joint pains. - Currently on infliximab, prednisone, and neurontin.  - Continue follow up with Rheumatology as an outpatient.   5. Chronic pain - due to history of 3 back surgeries - continue current PRN analgesics regimen  6. Depression -mood stable, no suicidal  ideation, no hallucinations -continue Zoloft 25mg  daily   Procedures:  See below for x-ray reports   Consultations:  None   Discharge Exam: Vitals:   08/03/17 2114 08/04/17 0646  BP: 139/67 138/64  Pulse: 82 85  Resp: 20 20  Temp: 98.9 F (37.2 C) 98.9 F (37.2 C)  SpO2: 100% 97%    General:  Patient afebrile over 24 hours now.  Feeling much better and in no acute distress. Reports just mild chest discomfort with coughing spells. Denies nausea/vomiting, dizziness. Tolerating diet well.  Cardiovascular: RRR. S1 and S2 heard. No murmurs, gallops or rubs, no JVD  Respiratory: Chest clear to auscultation. No wheezing, crackles or rhonchi.   Abdomen: Soft. Non distended. Non tender to palpation. Positive BS x 4 quadrants.   Musculoskeletal: Pulses full throughout. 5/5 strength in all four extremities.    Discharge Instructions   Discharge Instructions    Diet - low sodium heart healthy   Complete by:  As directed    Discharge instructions   Complete by:  As directed    Take medications as prescribed Keep yourself well-hydrated Arrange follow-up with PCP in 10 days.     Allergies as of 08/04/2017      Reactions   Lotensin [benazepril Hcl]    Face turns red swelling of face   Plaquenil [hydroxychloroquine Sulfate] Nausea And Vomiting, Other (See Comments)   GI UPSET   Doxycycline Other (See Comments)   blisters   Ibuprofen Hives   Penicillins Other (See Comments)   Breaks out Has patient had a PCN reaction causing immediate rash, facial/tongue/throat swelling, SOB or lightheadedness with hypotension: yes Has patient had a PCN reaction causing severe rash involving mucus membranes or skin necrosis: no  Has patient had a PCN reaction that required hospitalization: no Has patient had a PCN reaction occurring within the last 10 years:no If all of the above answers are "NO", then may proceed with Cephalosporin use.   Sulfa Antibiotics Other (See Comments)   Breaks  out      Medication List    TAKE these medications   benzonatate 200 MG capsule Commonly known as:  TESSALON Take 1 capsule (200 mg total) by mouth 3 (three) times daily as needed for cough.   hydrochlorothiazide 25 MG tablet Commonly known as:  HYDRODIURIL Take 25 mg by mouth daily.   inFLIXimab 100 MG injection Commonly known as:  REMICADE Inject into the vein.   loratadine 10 MG tablet Commonly known as:  CLARITIN Take 1 tablet (10 mg total) by mouth at bedtime.   oseltamivir 75 MG capsule Commonly known as:  TAMIFLU Take 1 capsule (75 mg total) by mouth 2 (two) times daily for 7 doses.   oxyCODONE-acetaminophen 10-325 MG tablet Commonly known as:  ENDOCET Take 1 tablet by mouth every 4 (four) hours as needed for pain.   predniSONE 10 MG tablet Commonly known as:  DELTASONE Take 10 mg by mouth daily.   sertraline 25 MG tablet Commonly known as:  ZOLOFT Take 50 tablets by mouth daily.   Vitamin D (Ergocalciferol) 50000 units Caps capsule Commonly known as:  DRISDOL Take 1 capsule (50,000 Units total) by mouth once a week. Start taking on:  08/10/2017      Allergies  Allergen Reactions  . Lotensin [Benazepril Hcl]     Face turns red swelling of face  . Plaquenil [Hydroxychloroquine Sulfate] Nausea And Vomiting and Other (See Comments)    GI UPSET  . Doxycycline Other (See Comments)    blisters  . Ibuprofen Hives  . Penicillins Other (See Comments)    Breaks out Has patient had a PCN reaction causing immediate rash, facial/tongue/throat swelling, SOB or lightheadedness with hypotension: yes Has patient had a PCN reaction causing severe rash involving mucus membranes or skin necrosis: no Has patient had a PCN reaction that required hospitalization: no Has patient had a PCN reaction occurring within the last 10 years:no If all of the above answers are "NO", then may proceed with Cephalosporin use.  . Sulfa Antibiotics Other (See Comments)    Breaks out    Follow-up Information    Samuel Jester, DO. Schedule an appointment as soon as possible for a visit in 10 day(s).   Contact information: [address] [place] [address] [phone]            The results of significant diagnostics from this hospitalization (including imaging, microbiology, ancillary and laboratory) are listed below for reference.    Significant Diagnostic Studies: Dg Chest 2 View  Result Date: 08/02/2017 CLINICAL DATA:  Right chest pain for the past 1-2 days. Nausea and vomiting since this morning. EXAM: CHEST - 2 VIEW COMPARISON:  Chest radiographs dated 07/04/2011 and chest CTA dated 05/11/2014. FINDINGS: Stable elevation of the right hemidiaphragm. Normal sized heart. Clear lungs. Mild peribronchial thickening. Minimal thoracic spine degenerative changes IMPRESSION: Mild bronchitic changes with progression. Electronically Signed   By: Beckie Salts M.D.   On: 08/02/2017 14:37    Microbiology: Recent Results (from the past 240 hour(s))  Culture, blood (Routine x 2)     Status: None (Preliminary result)   Collection Time: 08/02/17  3:35 PM  Result Value Ref Range Status   Specimen Description   Final  RIGHT ANTECUBITAL BOTTLES DRAWN AEROBIC AND ANAEROBIC   Special Requests Blood Culture adequate volume  Final   Culture   Final    NO GROWTH 2 DAYS Performed at [facility], [address] [place], [place] [address]    Report Status PENDING  Incomplete  Culture, blood (Routine x 2)     Status: None (Preliminary result)   Collection Time: 08/02/17  3:35 PM  Result Value Ref Range Status   Specimen Description   Final    LEFT ANTECUBITAL BOTTLES DRAWN AEROBIC AND ANAEROBIC   Special Requests Blood Culture adequate volume  Final   Culture   Final    NO GROWTH 2 DAYS Performed at [facility], [address] [place], [place] [address]    Report Status PENDING  Incomplete     Labs: Basic Metabolic Panel: Recent Labs  Lab 08/02/17 1534  08/03/17 0656 08/04/17 0656  NA 136 134* 139  K 2.7* 3.3* 3.5  CL 95* 101 108  CO2 29 25 25   GLUCOSE 97 103* 90  BUN 7 6 <5*  CREATININE 0.73 0.57 0.48  CALCIUM 9.0 7.5* 7.9*  MG  --  1.4*  --    Liver Function Tests: Recent Labs  Lab 08/02/17 1534  AST 20  ALT 18  ALKPHOS 87  BILITOT 0.7  PROT 7.5  ALBUMIN 3.6   No results for input(s): LIPASE, AMYLASE in the last 168 hours. No results for input(s): AMMONIA in the last 168 hours. CBC: Recent Labs  Lab 08/02/17 1534  WBC 5.6  NEUTROABS 4.0  HGB 13.2  HCT 42.5  MCV 84.8  PLT 175   Cardiac Enzymes: Recent Labs  Lab 08/02/17 1534  TROPONINI <0.03   BNP: BNP (last 3 results) No results for input(s): BNP in the last 8760 hours.  ProBNP (last 3 results) No results for input(s): PROBNP in the last 8760 hours.  CBG: No results for input(s): GLUCAP in the last 168 hours.     Signed:  08/04/17 MD.  [facility] 08/04/2017, 11:49 AM

## 2017-08-04 NOTE — Progress Notes (Signed)
Patient states understanding of discharge instructions, prescriptions given 

## 2017-08-05 ENCOUNTER — Telehealth: Payer: Self-pay | Admitting: Internal Medicine

## 2017-08-05 LAB — HIV 1/2 AB DIFFERENTIATION
HIV 1 Ab: NEGATIVE
HIV 2 Ab: NEGATIVE
Note: NEGATIVE

## 2017-08-05 LAB — RNA QUALITATIVE: HIV 1 RNA Qualitative: 1

## 2017-08-05 LAB — HIV ANTIBODY (ROUTINE TESTING W REFLEX): HIV Screen 4th Generation wRfx: REACTIVE — AB

## 2017-08-05 NOTE — Telephone Encounter (Signed)
She had a false positive HIV screen.

## 2017-08-07 LAB — CULTURE, BLOOD (ROUTINE X 2)
Culture: NO GROWTH
Culture: NO GROWTH
Special Requests: ADEQUATE
Special Requests: ADEQUATE

## 2017-09-09 ENCOUNTER — Other Ambulatory Visit (HOSPITAL_COMMUNITY): Payer: Self-pay | Admitting: Neurology

## 2017-09-09 DIAGNOSIS — M545 Low back pain: Secondary | ICD-10-CM

## 2017-09-18 ENCOUNTER — Ambulatory Visit (HOSPITAL_COMMUNITY): Payer: Medicaid Other

## 2017-09-23 ENCOUNTER — Other Ambulatory Visit (HOSPITAL_COMMUNITY): Payer: Self-pay | Admitting: Neurology

## 2017-09-23 ENCOUNTER — Ambulatory Visit (HOSPITAL_COMMUNITY)
Admission: RE | Admit: 2017-09-23 | Discharge: 2017-09-23 | Disposition: A | Discharge status: Home / Self Care | Payer: Medicaid Other | Source: Ambulatory Visit | Attending: Neurology | Admitting: Neurology

## 2017-09-23 DIAGNOSIS — R52 Pain, unspecified: Secondary | ICD-10-CM

## 2017-09-23 DIAGNOSIS — Z981 Arthrodesis status: Secondary | ICD-10-CM | POA: Insufficient documentation

## 2017-09-23 DIAGNOSIS — M5440 Lumbago with sciatica, unspecified side: Secondary | ICD-10-CM | POA: Diagnosis present

## 2017-09-23 DIAGNOSIS — M25552 Pain in left hip: Secondary | ICD-10-CM | POA: Diagnosis not present

## 2017-09-23 DIAGNOSIS — M545 Low back pain: Secondary | ICD-10-CM | POA: Diagnosis present

## 2017-09-23 DIAGNOSIS — M4854XA Collapsed vertebra, not elsewhere classified, thoracic region, initial encounter for fracture: Secondary | ICD-10-CM | POA: Diagnosis not present

## 2017-09-23 DIAGNOSIS — M48061 Spinal stenosis, lumbar region without neurogenic claudication: Secondary | ICD-10-CM | POA: Insufficient documentation

## 2017-11-12 DIAGNOSIS — M169 Osteoarthritis of hip, unspecified: Secondary | ICD-10-CM | POA: Diagnosis not present

## 2017-11-12 DIAGNOSIS — I10 Essential (primary) hypertension: Secondary | ICD-10-CM | POA: Diagnosis not present

## 2017-11-12 DIAGNOSIS — G629 Polyneuropathy, unspecified: Secondary | ICD-10-CM | POA: Diagnosis not present

## 2017-11-12 DIAGNOSIS — M5136 Other intervertebral disc degeneration, lumbar region: Secondary | ICD-10-CM | POA: Diagnosis not present

## 2017-11-12 DIAGNOSIS — M05741 Rheumatoid arthritis with rheumatoid factor of right hand without organ or systems involvement: Secondary | ICD-10-CM | POA: Diagnosis not present

## 2017-11-20 ENCOUNTER — Ambulatory Visit: Payer: Medicaid Other | Admitting: Orthopaedic Surgery

## 2017-11-20 ENCOUNTER — Encounter: Payer: Self-pay | Admitting: Orthopaedic Surgery

## 2017-11-20 VITALS — BP 151/90 | HR 87 | Ht 67.0 in | Wt 183.0 lb

## 2017-11-20 DIAGNOSIS — G8929 Other chronic pain: Secondary | ICD-10-CM

## 2017-11-20 DIAGNOSIS — G894 Chronic pain syndrome: Secondary | ICD-10-CM | POA: Diagnosis not present

## 2017-11-20 DIAGNOSIS — M5442 Lumbago with sciatica, left side: Secondary | ICD-10-CM

## 2017-11-20 DIAGNOSIS — M0579 Rheumatoid arthritis with rheumatoid factor of multiple sites without organ or systems involvement: Secondary | ICD-10-CM | POA: Diagnosis not present

## 2017-11-20 DIAGNOSIS — M25551 Pain in right hip: Secondary | ICD-10-CM | POA: Diagnosis not present

## 2017-11-20 NOTE — Progress Notes (Signed)
Subjective:    Patient ID: Kaitlin Fleming, female    DOB: 01-26-1961, 57 y.o.   MRN: [id number]  HPI She is here for left hip pain evaluation.  She has a long history of lower back pain,multiple back surgeries with resultant chronic lower back pain and chronic pain syndrome.  She has history of rheumatoid arthritis as well.  She reminded me that she saw me over 30 years ago and I made the diagnosis of rheumatoid arthritis.  She is seeing Dr. Danise Edge in [place] for this now. She is seeing Dr. Gerilyn Pilgrim and Dr. Charm Barges for her back.  She had multiple back surgeries by Dr. Jeral Fruit in the past.  She has good motion of the left hip.  She has no trauma.  She has pain from the lower back to the buttock down to the left hip, left thigh, past the knee to the lower leg and foot on the left.  She is taking Remicade IV for the RA, Zoloft and prednisone 5 mgm daily.  He back limits her activity not her hip.  She has no trauma., no redness.   Review of Systems  Constitutional: Positive for activity change.  Musculoskeletal: Positive for arthralgias, back pain, gait problem, joint swelling and myalgias.  All other systems reviewed and are negative.  For Review of Systems, all other systems reviewed and are negative.  Past Medical History:  Diagnosis Date  . Chronic back pain    stenosis  . Chronic pain    takes Lexapro daily  . Complication of anesthesia    pt states after surgery in 2000 had to wear a heart monitor for 3 days.  . DDD (degenerative disc disease)   . Depression   . GERD (gastroesophageal reflux disease)    takes OTC meds if needed  . History of blood transfusion    as a child  . History of bronchitis 21yrs ago  . Hyperlipidemia    takes Fenofibrate daily  . Joint pain   . Joint swelling   . Kidney stone    has one right now on the right side but not giving any problems  . Osteoarthritis   . Peripheral edema    takes HCTZ daily   . Pneumonia 52yrs ago   hx of  . PONV  (postoperative nausea and vomiting)   . Rheumatoid arthritis (HCC)   . Weakness    and tingling on left side    Past Surgical History:  Procedure Laterality Date  . ABDOMINAL HYSTERECTOMY  2000  . BACK SURGERY  03/10/14   lumbar fusion  . LUMBAR LAMINECTOMY/DECOMPRESSION MICRODISCECTOMY Left 12/08/2013   Procedure: LEFT LUMBAR FOUR-FIVEW, LUMBAR FIVE-SACRAL ONE LAMINECTOMY;  Surgeon: Karn Cassis, MD;  Location: [facility];  Service: Neurosurgery;  Laterality: Left;  . TUBAL LIGATION  24 yrs ago    Current Outpatient Medications on File Prior to Visit  Medication Sig Dispense Refill  . hydrochlorothiazide (HYDRODIURIL) 25 MG tablet Take 25 mg by mouth daily.    Marland Kitchen inFLIXimab (REMICADE) 100 MG injection Inject into the vein.    Marland Kitchen loratadine (CLARITIN) 10 MG tablet Take 1 tablet (10 mg total) by mouth at bedtime. 30 tablet 1  . predniSONE (DELTASONE) 5 MG tablet 6 pills x1day, 5x1,4x1,3x1,2x1,1x1    . sertraline (ZOLOFT) 25 MG tablet Take 50 tablets by mouth daily.     . folic acid (FOLVITE) 1 MG tablet Take by mouth.    . Vitamin D, Ergocalciferol, (DRISDOL) 50000 units CAPS  capsule Take 1 capsule (50,000 Units total) by mouth once a week. (Patient not taking: Reported on 11/20/2017)     No current facility-administered medications on file prior to visit.     Social History   Socioeconomic History  . Marital status: Married    Spouse name: Not on file  . Number of children: Not on file  . Years of education: Not on file  . Highest education level: Not on file  Occupational History  . Not on file  Social Needs  . Financial resource strain: Not on file  . Food insecurity:    Worry: Not on file    Inability: Not on file  . Transportation needs:    Medical: Not on file    Non-medical: Not on file  Tobacco Use  . Smoking status: Never Smoker  . Smokeless tobacco: Never Used  Substance and Sexual Activity  . Alcohol use: No  . Drug use: No  . Sexual activity: Yes     Birth control/protection: Surgical  Lifestyle  . Physical activity:    Days per week: Not on file    Minutes per session: Not on file  . Stress: Not on file  Relationships  . Social connections:    Talks on phone: Not on file    Gets together: Not on file    Attends religious service: Not on file    Active member of club or organization: Not on file    Attends meetings of clubs or organizations: Not on file    Relationship status: Not on file  . Intimate partner violence:    Fear of current or ex partner: Not on file    Emotionally abused: Not on file    Physically abused: Not on file    Forced sexual activity: Not on file  Other Topics Concern  . Not on file  Social History Narrative  . Not on file    Family history of rheumatoid arthritis and heart disease. Her father had cancer, leukemia.  Mother had RA and COPD.  BP (!) 151/90   Pulse 87   Ht 5\' 7"  (1.702 m)   Wt 183 lb (83 kg)   BMI 28.66 kg/m   Body mass index is 28.66 kg/m.      Objective:   Physical Exam  Constitutional: She is oriented to person, place, and time. She appears well-developed and well-nourished.  HENT:  Head: Normocephalic and atraumatic.  Eyes: Pupils are equal, round, and reactive to light. Conjunctivae and EOM are normal.  Neck: Normal range of motion. Neck supple.  Cardiovascular: Normal rate, regular rhythm and intact distal pulses.  Pulmonary/Chest: Effort normal.  Abdominal: Soft.  Musculoskeletal:       Back:       Legs: Neurological: She is alert and oriented to person, place, and time. She has normal reflexes. She displays normal reflexes. No cranial nerve deficit. She exhibits normal muscle tone. Coordination normal.  Skin: Skin is warm and dry.  Psychiatric: She has a normal mood and affect. Her behavior is normal. Judgment and thought content normal.     I have reviewed multiple notes, x-rays and reports.     Assessment & Plan:   Encounter Diagnoses  Name Primary?    . Chronic left-sided low back pain with left-sided sciatica Yes  . Right hip pain   . Rheumatoid arthritis involving multiple sites with positive rheumatoid factor (HCC)   . Chronic pain syndrome    I feel her hip pain  is from her lower back.  She has near full ROM and no pain of the left hip.  She does have underlying RA which may affect the hip and the back as well.  She is considering seeking a new rheumatologist physician.  She says her current one is retiring soon.    I will see her only as needed.  Electronically Signed Darreld Mclean, MD 7/10/20193:14 PM

## 2017-11-20 NOTE — Patient Instructions (Signed)
Dr. Hilda Lias recommended she go to a Rheumatologist.  She has been going to a Dr. At the [facility] but wants a second opinion.  She has a friend who is a Engineer, civil (consulting) at a place in [place] that she wants to go to.  She does not know the Dr.'s name.  She will get back in touch with [place] or she will have Dr. Charm Barges, her family doctor, make the referral.  I told her that she will need all of the notes from the Dr. she has been seeing before the new Rheumatologist will schedule her.  She will let us know what to do.

## 2017-11-26 ENCOUNTER — Emergency Department
Admission: EM | Admit: 2017-11-26 | Discharge: 2017-11-26 | Disposition: A | Discharge status: Home / Self Care | Payer: Medicaid Other | Attending: Emergency Medicine | Admitting: Emergency Medicine

## 2017-11-26 ENCOUNTER — Encounter: Payer: Self-pay | Admitting: Emergency Medicine

## 2017-11-26 ENCOUNTER — Emergency Department: Payer: Medicaid Other

## 2017-11-26 ENCOUNTER — Other Ambulatory Visit: Payer: Self-pay

## 2017-11-26 DIAGNOSIS — R070 Pain in throat: Secondary | ICD-10-CM | POA: Insufficient documentation

## 2017-11-26 DIAGNOSIS — R21 Rash and other nonspecific skin eruption: Secondary | ICD-10-CM | POA: Diagnosis not present

## 2017-11-26 DIAGNOSIS — R0789 Other chest pain: Secondary | ICD-10-CM | POA: Insufficient documentation

## 2017-11-26 DIAGNOSIS — R232 Flushing: Secondary | ICD-10-CM | POA: Diagnosis not present

## 2017-11-26 DIAGNOSIS — R0600 Dyspnea, unspecified: Secondary | ICD-10-CM | POA: Insufficient documentation

## 2017-11-26 DIAGNOSIS — R000 Tachycardia, unspecified: Secondary | ICD-10-CM | POA: Diagnosis not present

## 2017-11-26 DIAGNOSIS — Z79899 Other long term (current) drug therapy: Secondary | ICD-10-CM | POA: Diagnosis not present

## 2017-11-26 DIAGNOSIS — T782XXA Anaphylactic shock, unspecified, initial encounter: Secondary | ICD-10-CM

## 2017-11-26 DIAGNOSIS — L508 Other urticaria: Secondary | ICD-10-CM | POA: Diagnosis not present

## 2017-11-26 DIAGNOSIS — R0602 Shortness of breath: Secondary | ICD-10-CM | POA: Diagnosis not present

## 2017-11-26 DIAGNOSIS — T50905A Adverse effect of unspecified drugs, medicaments and biological substances, initial encounter: Secondary | ICD-10-CM | POA: Diagnosis not present

## 2017-11-26 DIAGNOSIS — T7840XA Allergy, unspecified, initial encounter: Secondary | ICD-10-CM | POA: Diagnosis present

## 2017-11-26 DIAGNOSIS — M0579 Rheumatoid arthritis with rheumatoid factor of multiple sites without organ or systems involvement: Secondary | ICD-10-CM | POA: Diagnosis not present

## 2017-11-26 LAB — BASIC METABOLIC PANEL
Anion gap: 11 (ref 5–15)
BUN: 6 mg/dL (ref 6–20)
CO2: 29 mmol/L (ref 22–32)
Calcium: 8.7 mg/dL — ABNORMAL LOW (ref 8.9–10.3)
Chloride: 94 mmol/L — ABNORMAL LOW (ref 98–111)
Creatinine, Ser: 0.55 mg/dL (ref 0.44–1.00)
GFR calc Af Amer: >60 mL/min (ref >60)
GFR calc non Af Amer: >60 mL/min (ref >60)
Glucose, Bld: 147 mg/dL — ABNORMAL HIGH (ref 70–99)
Potassium: 2.8 mmol/L — ABNORMAL LOW (ref 3.5–5.1)
Sodium: 134 mmol/L — ABNORMAL LOW (ref 135–145)

## 2017-11-26 LAB — CBC WITH DIFFERENTIAL/PLATELET
Basophils Absolute: 0 10*3/uL (ref 0–0.1)
Basophils Relative: 0 %
Eosinophils Absolute: 0 10*3/uL (ref 0–0.7)
Eosinophils Relative: 0 %
HCT: 41.7 % (ref 35.0–47.0)
Hemoglobin: 14.2 g/dL (ref 12.0–16.0)
Lymphocytes Relative: 36 %
Lymphs Abs: 3.7 10*3/uL — ABNORMAL HIGH (ref 1.0–3.6)
MCH: 27.4 pg (ref 26.0–34.0)
MCHC: 33.9 g/dL (ref 32.0–36.0)
MCV: 80.9 fL (ref 80.0–100.0)
Monocytes Absolute: 0.8 10*3/uL (ref 0.2–0.9)
Monocytes Relative: 8 %
Neutro Abs: 5.8 10*3/uL (ref 1.4–6.5)
Neutrophils Relative %: 56 %
Platelets: 249 10*3/uL (ref 150–440)
RBC: 5.16 MIL/uL (ref 3.80–5.20)
RDW: 15.6 % — ABNORMAL HIGH (ref 11.5–14.5)
WBC: 10.4 10*3/uL (ref 3.6–11.0)

## 2017-11-26 LAB — TROPONIN I
Troponin I: <0.03 ng/mL (ref <0.03)
Troponin I: <0.03 ng/mL (ref <0.03)

## 2017-11-26 MED ORDER — HYDROXYZINE HCL 25 MG PO TABS
50.0000 mg | ORAL_TABLET | Freq: Once | ORAL | Status: AC
  Administered 2017-11-26: 50 mg via ORAL
  Filled 2017-11-26: qty 2

## 2017-11-26 MED ORDER — EPINEPHRINE 0.3 MG/0.3ML IJ SOAJ
INTRAMUSCULAR | Status: AC
  Administered 2017-11-26: 0.3 mg via INTRAMUSCULAR
  Filled 2017-11-26: qty 0.3

## 2017-11-26 MED ORDER — PREDNISONE 20 MG PO TABS: 40.0000 mg | ORAL_TABLET | Freq: Every day | ORAL | 0 refills | Status: AC

## 2017-11-26 MED ORDER — HYDROXYZINE HCL 25 MG PO TABS: 25.0000 mg | ORAL_TABLET | Freq: Three times a day (TID) | ORAL | 0 refills | Status: DC | PRN

## 2017-11-26 MED ORDER — FAMOTIDINE IN NACL 20-0.9 MG/50ML-% IV SOLN
INTRAVENOUS | Status: AC
  Administered 2017-11-26: 20 mg via INTRAVENOUS
  Filled 2017-11-26: qty 50

## 2017-11-26 MED ORDER — EPINEPHRINE 0.3 MG/0.3ML IJ SOAJ: 0.3000 mg | Freq: Once | INTRAMUSCULAR | Status: DC

## 2017-11-26 MED ORDER — EPINEPHRINE 0.3 MG/0.3ML IJ SOAJ
0.3000 mg | Freq: Once | INTRAMUSCULAR | Status: AC
  Administered 2017-11-26: 0.3 mg via INTRAMUSCULAR

## 2017-11-26 MED ORDER — FAMOTIDINE IN NACL 20-0.9 MG/50ML-% IV SOLN
20.0000 mg | Freq: Once | INTRAVENOUS | Status: AC
  Administered 2017-11-26: 20 mg via INTRAVENOUS

## 2017-11-26 MED ORDER — EPINEPHRINE 0.3 MG/0.3ML IJ SOAJ: 0.3000 mg | Freq: Once | INTRAMUSCULAR | 0 refills | Status: AC

## 2017-11-26 NOTE — ED Notes (Signed)
Pt ambulatory upon discharge; declined wheel chair. Verbalized understanding of discharge instructions, follow-up care and prescription. VSS. Skin warm and dry. A&O x4.  

## 2017-11-26 NOTE — ED Notes (Signed)
Pt's rash starting to come back on bilateral legs. Pt reports it is itchy. No change in airway at this time. MD aware. No orders at this time d/t medications already administered PTA. Will continue to monitor.

## 2017-11-26 NOTE — ED Notes (Signed)
Pt's rash has improved. Not itching as much but still present. No chest pain at this time. No difficulties breathing. O2 sat 96-99% on room air depending on pt's position. Pt talking in full sentences. States she feels a lot better. MD aware. Will continue to monitor.

## 2017-11-26 NOTE — ED Notes (Signed)
Pt's rash returning to bilateral lower legs, right elbow, bilateral hands (knuckles). MD aware and present at bedside at this time to assess.

## 2017-11-26 NOTE — ED Notes (Signed)
Pt reporting that she feels better at this time. Face is not as flushed as when she came in, although still a little flush. Pt reports airway feels "much better." Will continue to monitor.

## 2017-11-26 NOTE — ED Triage Notes (Addendum)
Pt arrives via stretcher from [facility] after receiving Remicade injection. Pt has had 4-5 of these injections in the past with no adverse reactions. Pt states she feels as though her throat is closing. A&O at this time. Responsive and answering questions. Pt received 50mg  benedryl and 125mg  solu-medrol PTA at [facility].  Hx rheumatoid arthritis, DDD.

## 2017-11-26 NOTE — ED Provider Notes (Signed)
[facility] Emergency Department Provider Note ____________________________________________   First MD Initiated Contact with Patient 11/26/17 1413     (approximate)  I have reviewed the triage vital signs and the nursing notes.   HISTORY  Chief Complaint Allergic Reaction  HPI Kaitlin Fleming is a 57 y.o. female a history of rheumatoid arthritis was presenting to the emergency department today with an anaphylactic reaction.  She was receiving a Remicade infusion when she began to feel flush with a rash to her face.  She then says that she had difficulty breathing, chest pressure as well as itching, diffusely.  She was given Solu-Medrol as well as Benadryl via IV prior to arrival and was transported to the emergency room emergently.  She says that she feels like she is having her throat closed.  Has never had a similar reaction before and this is not her first Remicade infusion.  She describes the pain is to the center of her chest and is a 10 out of 10.  No radiation.  Past Medical History:  Diagnosis Date  . Chronic back pain    stenosis  . Chronic pain    takes Lexapro daily  . Complication of anesthesia    pt states after surgery in 2000 had to wear a heart monitor for 3 days.  . DDD (degenerative disc disease)   . Depression   . GERD (gastroesophageal reflux disease)    takes OTC meds if needed  . History of blood transfusion    as a child  . History of bronchitis 23yrs ago  . Hyperlipidemia    takes Fenofibrate daily  . Joint pain   . Joint swelling   . Kidney stone    has one right now on the right side but not giving any problems  . Osteoarthritis   . Peripheral edema    takes HCTZ daily   . Pneumonia 30yrs ago   hx of  . PONV (postoperative nausea and vomiting)   . Rheumatoid arthritis (HCC)   . Rheumatoid arthritis (HCC)   . Weakness    and tingling on left side    Patient Active Problem List   Diagnosis Date Noted  . Rheumatoid  arthritis (HCC) 08/02/2017  . Influenza due to influenza virus, type B 08/02/2017  . Influenza B 08/02/2017  . Lumbar radiculopathy, chronic 06/22/2014  . Lumbar degenerative disc disease 03/10/2014  . Lumbar stenosis without neurogenic claudication 12/08/2013    Past Surgical History:  Procedure Laterality Date  . ABDOMINAL HYSTERECTOMY  2000  . BACK SURGERY  03/10/14   lumbar fusion  . LUMBAR LAMINECTOMY/DECOMPRESSION MICRODISCECTOMY Left 12/08/2013   Procedure: LEFT LUMBAR FOUR-FIVEW, LUMBAR FIVE-SACRAL ONE LAMINECTOMY;  Surgeon: Karn Cassis, MD;  Location: [facility];  Service: Neurosurgery;  Laterality: Left;  . TUBAL LIGATION  24 yrs ago    Prior to Admission medications   Medication Sig Start Date End Date Taking? Authorizing Provider  folic acid (FOLVITE) 1 MG tablet Take by mouth. 10/01/17 10/01/18  [provider]  hydrochlorothiazide (HYDRODIURIL) 25 MG tablet Take 25 mg by mouth daily.    [provider]  inFLIXimab (REMICADE) 100 MG injection Inject into the vein.    [provider]  loratadine (CLARITIN) 10 MG tablet Take 1 tablet (10 mg total) by mouth at bedtime. 08/04/17   Vassie Loll, MD  predniSONE (DELTASONE) 5 MG tablet 6 pills x1day, 5x1,4x1,3x1,2x1,1x1 10/01/17   [provider]  sertraline (ZOLOFT) 25 MG tablet  Take 50 tablets by mouth daily.     [provider]  Vitamin D, Ergocalciferol, (DRISDOL) 50000 units CAPS capsule Take 1 capsule (50,000 Units total) by mouth once a week. Patient not taking: Reported on 11/20/2017 08/10/17   Vassie Loll, MD    Allergies Lotensin [benazepril hcl]; Plaquenil [hydroxychloroquine sulfate]; Doxycycline; Ibuprofen; Penicillins; and Sulfa antibiotics  No family history on file.  Social History Social History   Tobacco Use  . Smoking status: Never Smoker  . Smokeless tobacco: Never Used  Substance Use Topics  . Alcohol use: No  . Drug use: No    Review of  Systems  Constitutional: No fever/chills Eyes: No visual changes. ENT: No sore throat. Cardiovascular: Denies chest pain. Respiratory: As above Gastrointestinal: No abdominal pain.  No diarrhea.  No constipation. Genitourinary: Negative for dysuria. Musculoskeletal: Negative for back pain. Skin: As above Neurological: Negative for headaches, focal weakness or numbness.   ____________________________________________   PHYSICAL EXAM:  VITAL SIGNS: ED Triage Vitals  Enc Vitals Group     BP 11/26/17 1418 (!) 140/105     Pulse Rate 11/26/17 1418 100     Resp 11/26/17 1418 18     Temp 11/26/17 1418 98 F (36.7 C)     Temp Source 11/26/17 1418 Oral     SpO2 11/26/17 1418 100 %     Weight 11/26/17 1420 183 lb (83 kg)     Height 11/26/17 1420 5\' 7"  (1.702 m)     Head Circumference --      Peak Flow --      Pain Score 11/26/17 1419 8     Pain Loc --      Pain Edu? --      Excl. in GC? --     Constitutional: Alert and oriented.  Patient appears uncomfortable.  Is coughing and retching into an emesis bag. Eyes: Conjunctivae are normal.  Head: Atraumatic. Nose: No congestion/rhinnorhea. Mouth/Throat: Mucous membranes are moist.  No tongue or pharyngeal swelling.   Neck: No stridor.   Cardiovascular: Normal rate, regular rhythm. Grossly normal heart sounds.   Respiratory: Slightly tachypneic but speaking in a normal voice.  Lungs are clear. Gastrointestinal: Soft and nontender. No distention.  Musculoskeletal: No lower extremity tenderness nor edema.  No joint effusions. Neurologic:  Normal speech and language. No gross focal neurologic deficits are appreciated. Skin:  Skin is warm, dry and intact. No rash noted. Psychiatric: Mood and affect are normal. Speech and behavior are normal.  ____________________________________________   LABS (all labs ordered are listed, but only abnormal results are displayed)  Labs Reviewed  CBC WITH DIFFERENTIAL/PLATELET - Abnormal; Notable  for the following components:      Result Value   RDW 15.6 (*)    Lymphs Abs 3.7 (*)    All other components within normal limits  BASIC METABOLIC PANEL - Abnormal; Notable for the following components:   Sodium 134 (*)    Potassium 2.8 (*)    Chloride 94 (*)    Glucose, Bld 147 (*)    Calcium 8.7 (*)    All other components within normal limits  TROPONIN I   ____________________________________________  EKG  ED ECG REPORT I, 11/28/17, the attending physician, personally viewed and interpreted this ECG.   Date: 11/26/2017  EKG Time: 1406  Rate: 101  Rhythm: sinus tachycardia  Axis: Normal  Intervals:none  ST&T Change: No ST segment elevation or depression.  No abnormal T wave inversion.  ____________________________________________  RADIOLOGY  Slight right base atelectasis ____________________________________________   PROCEDURES  Procedure(s) performed:   .Critical Care Performed by: Myrna Blazer, MD Authorized by: Myrna Blazer, MD   Critical care provider statement:    Critical care time (minutes):  35   Critical care time was exclusive of:  Separately billable procedures and treating other patients   Critical care was necessary to treat or prevent imminent or life-threatening deterioration of the following conditions: anaphylaxis.   Critical care was time spent personally by me on the following activities:  Development of treatment plan with patient or surrogate, discussions with consultants, evaluation of patient's response to treatment, examination of patient, obtaining history from patient or surrogate, ordering and performing treatments and interventions, ordering and review of laboratory studies, ordering and review of radiographic studies, pulse oximetry, re-evaluation of patient's condition and review of old charts    Critical Care performed:   ____________________________________________   INITIAL IMPRESSION /  ASSESSMENT AND PLAN / ED COURSE  Pertinent labs & imaging results that were available during my care of the patient were reviewed by me and considered in my medical decision making (see chart for details).  DDX: Anaphylaxis, anaphylactoid reaction, allergic reaction, nausea and vomiting, chest pain, AMI As part of my medical decision making, I reviewed the following data within the electronic MEDICAL RECORD NUMBER Notes from prior ED visits  ----------------------------------------- 4:41 PM on 11/26/2017 -----------------------------------------  Patient was given epinephrine and her throat closing was relieved.  We will continue to observe.  Chest pain decreasing as well as rash.  ----------------------------------------- 6:35 PM on 11/26/2017 -----------------------------------------  Patient required dose of Atarax because return of rash and itching but now rash is almost completely gone.  Will discharge home with Atarax.  Patient on 5 mg of daily prednisone and I will increase to 40 mg for the next 7 days.  We will also discharged with an EpiPen as well as prescription for Atarax.  Patient to follow-up with her rheumatologist, Dr. Gavin Potters regarding further Remicade or other rheumatoid arthritis treatments.  She is understanding of the treatment plan willing to comply.  Continues to have no respiratory distress.  No chest pain at this time.  2- troponins.  Likely all constellation of symptoms related to anaphylactic reaction. ____________________________________________   FINAL CLINICAL IMPRESSION(S) / ED DIAGNOSES  Anaphylactic reaction.    NEW MEDICATIONS STARTED DURING THIS VISIT:  New Prescriptions   No medications on file     Note:  This document was prepared using Dragon voice recognition software and may include unintentional dictation errors.     Myrna Blazer, MD 11/26/17 [phone]

## 2017-11-26 NOTE — ED Notes (Signed)
ED Provider at bedside. 

## 2017-11-26 NOTE — ED Notes (Signed)
Portable XR at bedside

## 2017-11-26 NOTE — ED Notes (Signed)
Pt given handheld phone. Requested to call her mother so "she can pay for [pt]."

## 2017-12-04 DIAGNOSIS — H6981 Other specified disorders of Eustachian tube, right ear: Secondary | ICD-10-CM | POA: Diagnosis not present

## 2017-12-04 DIAGNOSIS — B880 Other acariasis: Secondary | ICD-10-CM | POA: Diagnosis not present

## 2017-12-16 DIAGNOSIS — G603 Idiopathic progressive neuropathy: Secondary | ICD-10-CM | POA: Diagnosis not present

## 2017-12-16 DIAGNOSIS — M069 Rheumatoid arthritis, unspecified: Secondary | ICD-10-CM | POA: Diagnosis not present

## 2017-12-16 DIAGNOSIS — M25552 Pain in left hip: Secondary | ICD-10-CM | POA: Diagnosis not present

## 2017-12-16 DIAGNOSIS — Z79891 Long term (current) use of opiate analgesic: Secondary | ICD-10-CM | POA: Diagnosis not present

## 2017-12-18 ENCOUNTER — Other Ambulatory Visit: Payer: Self-pay | Admitting: Neurology

## 2017-12-18 DIAGNOSIS — M5416 Radiculopathy, lumbar region: Secondary | ICD-10-CM

## 2018-01-08 ENCOUNTER — Inpatient Hospital Stay
Admission: RE | Admit: 2018-01-08 | Discharge: 2018-01-08 | Disposition: A | Discharge status: Home / Self Care | Payer: Medicaid Other | Source: Ambulatory Visit | Attending: Neurology | Admitting: Neurology

## 2018-02-04 DIAGNOSIS — M79671 Pain in right foot: Secondary | ICD-10-CM | POA: Diagnosis not present

## 2018-02-04 DIAGNOSIS — M0579 Rheumatoid arthritis with rheumatoid factor of multiple sites without organ or systems involvement: Secondary | ICD-10-CM | POA: Diagnosis not present

## 2018-02-04 DIAGNOSIS — Z79899 Other long term (current) drug therapy: Secondary | ICD-10-CM | POA: Diagnosis not present

## 2018-02-04 DIAGNOSIS — M79641 Pain in right hand: Secondary | ICD-10-CM | POA: Diagnosis not present

## 2018-02-14 DIAGNOSIS — M25552 Pain in left hip: Secondary | ICD-10-CM | POA: Diagnosis not present

## 2018-02-14 DIAGNOSIS — G603 Idiopathic progressive neuropathy: Secondary | ICD-10-CM | POA: Diagnosis not present

## 2018-02-14 DIAGNOSIS — Z79891 Long term (current) use of opiate analgesic: Secondary | ICD-10-CM | POA: Diagnosis not present

## 2018-02-14 DIAGNOSIS — M069 Rheumatoid arthritis, unspecified: Secondary | ICD-10-CM | POA: Diagnosis not present

## 2018-03-26 DIAGNOSIS — J209 Acute bronchitis, unspecified: Secondary | ICD-10-CM | POA: Diagnosis not present

## 2018-03-26 DIAGNOSIS — J020 Streptococcal pharyngitis: Secondary | ICD-10-CM | POA: Diagnosis not present

## 2018-04-04 DIAGNOSIS — Z79891 Long term (current) use of opiate analgesic: Secondary | ICD-10-CM | POA: Diagnosis not present

## 2018-04-04 DIAGNOSIS — M069 Rheumatoid arthritis, unspecified: Secondary | ICD-10-CM | POA: Diagnosis not present

## 2018-04-04 DIAGNOSIS — M545 Low back pain: Secondary | ICD-10-CM | POA: Diagnosis not present

## 2018-04-04 DIAGNOSIS — M25552 Pain in left hip: Secondary | ICD-10-CM | POA: Diagnosis not present

## 2018-04-04 DIAGNOSIS — G603 Idiopathic progressive neuropathy: Secondary | ICD-10-CM | POA: Diagnosis not present

## 2018-04-27 DIAGNOSIS — J069 Acute upper respiratory infection, unspecified: Secondary | ICD-10-CM | POA: Diagnosis not present

## 2018-04-27 DIAGNOSIS — J029 Acute pharyngitis, unspecified: Secondary | ICD-10-CM | POA: Diagnosis not present

## 2018-05-09 DIAGNOSIS — J209 Acute bronchitis, unspecified: Secondary | ICD-10-CM | POA: Diagnosis not present

## 2018-05-09 DIAGNOSIS — R05 Cough: Secondary | ICD-10-CM | POA: Diagnosis not present

## 2018-05-09 DIAGNOSIS — R52 Pain, unspecified: Secondary | ICD-10-CM | POA: Diagnosis not present

## 2018-05-09 DIAGNOSIS — H10023 Other mucopurulent conjunctivitis, bilateral: Secondary | ICD-10-CM | POA: Diagnosis not present

## 2018-05-12 ENCOUNTER — Other Ambulatory Visit: Payer: Self-pay

## 2018-05-12 ENCOUNTER — Encounter (HOSPITAL_COMMUNITY): Payer: Self-pay | Admitting: *Deleted

## 2018-05-12 ENCOUNTER — Emergency Department (HOSPITAL_COMMUNITY): Payer: Medicaid Other

## 2018-05-12 ENCOUNTER — Emergency Department (HOSPITAL_COMMUNITY)
Admission: EM | Admit: 2018-05-12 | Discharge: 2018-05-12 | Disposition: A | Discharge status: Home / Self Care | Payer: Medicaid Other | Attending: Emergency Medicine | Admitting: Emergency Medicine

## 2018-05-12 DIAGNOSIS — J209 Acute bronchitis, unspecified: Secondary | ICD-10-CM | POA: Diagnosis not present

## 2018-05-12 DIAGNOSIS — R05 Cough: Secondary | ICD-10-CM | POA: Diagnosis not present

## 2018-05-12 DIAGNOSIS — Z79899 Other long term (current) drug therapy: Secondary | ICD-10-CM | POA: Diagnosis not present

## 2018-05-12 MED ORDER — PREDNISONE 50 MG PO TABS
60.0000 mg | ORAL_TABLET | Freq: Once | ORAL | Status: AC
  Administered 2018-05-12: 60 mg via ORAL
  Filled 2018-05-12: qty 1

## 2018-05-12 MED ORDER — PREDNISONE 20 MG PO TABS: ORAL_TABLET | ORAL | 0 refills | Status: DC

## 2018-05-12 MED ORDER — IPRATROPIUM-ALBUTEROL 0.5-2.5 (3) MG/3ML IN SOLN
3.0000 mL | Freq: Once | RESPIRATORY_TRACT | Status: AC
  Administered 2018-05-12: 3 mL via RESPIRATORY_TRACT
  Filled 2018-05-12: qty 3

## 2018-05-12 MED ORDER — ALBUTEROL SULFATE HFA 108 (90 BASE) MCG/ACT IN AERS
2.0000 | INHALATION_SPRAY | RESPIRATORY_TRACT | Status: DC | PRN
  Administered 2018-05-12: 2 via RESPIRATORY_TRACT
  Filled 2018-05-12: qty 6.7

## 2018-05-12 MED ORDER — ALBUTEROL SULFATE (2.5 MG/3ML) 0.083% IN NEBU
2.5000 mg | INHALATION_SOLUTION | Freq: Once | RESPIRATORY_TRACT | Status: AC
  Administered 2018-05-12: 2.5 mg via RESPIRATORY_TRACT
  Filled 2018-05-12: qty 3

## 2018-05-12 NOTE — ED Triage Notes (Signed)
Pt c/o cough and sob x 2 weeks; pt states she has been to urgent care twice and has been given cough meds, antibiotics and prednisone with no relief

## 2018-05-12 NOTE — ED Provider Notes (Signed)
[facility] EMERGENCY DEPARTMENT Provider Note   CSN: [id number] Arrival date & time: 05/12/18  0020     History   Chief Complaint Chief Complaint  Patient presents with  . Cough    HPI Kaitlin Fleming is a 57 y.o. female.  Patient presents to the emergency department for evaluation of persistent cough.  Patient reports symptoms ongoing for 1-1/2 weeks.  She has been to urgent care twice.  She reports the first time she got [employer] and it did not help.  She went back 2 days ago and was started on Biaxin, prednisone taper and hydrocodone cough syrup.  She reports that she is still feeling poorly.  When she was at urgent care she received a nebulizer treatment and reports that it significantly helped.  She thinks she might need to be put on albuterol.     Past Medical History:  Diagnosis Date  . Chronic back pain    stenosis  . Chronic pain    takes Lexapro daily  . Complication of anesthesia    pt states after surgery in 2000 had to wear a heart monitor for 3 days.  . DDD (degenerative disc disease)   . Depression   . GERD (gastroesophageal reflux disease)    takes OTC meds if needed  . History of blood transfusion    as a child  . History of bronchitis 75yrs ago  . Hyperlipidemia    takes Fenofibrate daily  . Joint pain   . Joint swelling   . Kidney stone    has one right now on the right side but not giving any problems  . Osteoarthritis   . Peripheral edema    takes HCTZ daily   . Pneumonia 77yrs ago   hx of  . PONV (postoperative nausea and vomiting)   . Rheumatoid arthritis (HCC)   . Rheumatoid arthritis (HCC)   . Weakness    and tingling on left side    Patient Active Problem List   Diagnosis Date Noted  . Rheumatoid arthritis (HCC) 08/02/2017  . Influenza due to influenza virus, type B 08/02/2017  . Influenza B 08/02/2017  . Lumbar radiculopathy, chronic 06/22/2014  . Lumbar degenerative disc disease 03/10/2014  . Lumbar stenosis without  neurogenic claudication 12/08/2013    Past Surgical History:  Procedure Laterality Date  . ABDOMINAL HYSTERECTOMY  2000  . BACK SURGERY  03/10/14   lumbar fusion  . LUMBAR LAMINECTOMY/DECOMPRESSION MICRODISCECTOMY Left 12/08/2013   Procedure: LEFT LUMBAR FOUR-FIVEW, LUMBAR FIVE-SACRAL ONE LAMINECTOMY;  Surgeon: Karn Cassis, MD;  Location: [facility];  Service: Neurosurgery;  Laterality: Left;  . TUBAL LIGATION  24 yrs ago     OB History   No obstetric history on file.      Home Medications    Prior to Admission medications   Medication Sig Start Date End Date Taking? Authorizing Provider  folic acid (FOLVITE) 1 MG tablet Take by mouth. 10/01/17 10/01/18  [provider]  hydrochlorothiazide (HYDRODIURIL) 25 MG tablet Take 25 mg by mouth daily.    [provider]  hydrOXYzine (ATARAX/VISTARIL) 25 MG tablet Take 1 tablet (25 mg total) by mouth 3 (three) times daily as needed for itching. 11/26/17   Myrna Blazer, MD  inFLIXimab (REMICADE) 100 MG injection Inject into the vein.    [provider]  loratadine (CLARITIN) 10 MG tablet Take 1 tablet (10 mg total) by mouth at bedtime. 08/04/17   Vassie Loll, MD  predniSONE (DELTASONE)  20 MG tablet Take 2 tablets (40 mg total) by mouth daily. 11/26/17 11/26/18  Myrna Blazer, MD  sertraline (ZOLOFT) 25 MG tablet Take 50 tablets by mouth daily.     [provider]  Vitamin D, Ergocalciferol, (DRISDOL) 50000 units CAPS capsule Take 1 capsule (50,000 Units total) by mouth once a week. Patient not taking: Reported on 11/20/2017 08/10/17   Vassie Loll, MD    Family History History reviewed. No pertinent family history.  Social History Social History   Tobacco Use  . Smoking status: Never Smoker  . Smokeless tobacco: Never Used  Substance Use Topics  . Alcohol use: No  . Drug use: No     Allergies   Lotensin [benazepril hcl]; Plaquenil [hydroxychloroquine sulfate];  Macrolides and ketolides; Remicade [infliximab]; Doxycycline; Ibuprofen; Penicillins; and Sulfa antibiotics   Review of Systems Review of Systems  Respiratory: Positive for cough and shortness of breath.   All other systems reviewed and are negative.    Physical Exam Updated Vital Signs BP (!) 156/83 (BP Location: Right Arm)   Pulse 79   Temp 98.1 F (36.7 C) (Oral)   Resp 20   Ht 5\' 7"  (1.702 m)   Wt 79.4 kg   SpO2 96%   BMI 27.41 kg/m   Physical Exam Vitals signs and nursing note reviewed.  Constitutional:      General: She is not in acute distress.    Appearance: Normal appearance. She is well-developed.  HENT:     Head: Normocephalic and atraumatic.     Right Ear: Hearing normal.     Left Ear: Hearing normal.     Nose: Nose normal.  Eyes:     Conjunctiva/sclera: Conjunctivae normal.     Pupils: Pupils are equal, round, and reactive to light.  Neck:     Musculoskeletal: Normal range of motion and neck supple.  Cardiovascular:     Rate and Rhythm: Regular rhythm.     Heart sounds: S1 normal and S2 normal. No murmur. No friction rub. No gallop.   Pulmonary:     Effort: Pulmonary effort is normal. No respiratory distress.     Breath sounds: Normal breath sounds.  Chest:     Chest wall: No tenderness.  Abdominal:     General: Bowel sounds are normal.     Palpations: Abdomen is soft.     Tenderness: There is no abdominal tenderness. There is no guarding or rebound. Negative signs include Murphy's sign and McBurney's sign.     Hernia: No hernia is present.  Musculoskeletal: Normal range of motion.  Skin:    General: Skin is warm and dry.     Findings: No rash.  Neurological:     Mental Status: She is alert and oriented to person, place, and time.     GCS: GCS eye subscore is 4. GCS verbal subscore is 5. GCS motor subscore is 6.     Cranial Nerves: No cranial nerve deficit.     Sensory: No sensory deficit.     Coordination: Coordination normal.  Psychiatric:         Speech: Speech normal.        Behavior: Behavior normal.        Thought Content: Thought content normal.      ED Treatments / Results  Labs (all labs ordered are listed, but only abnormal results are displayed) Labs Reviewed - No data to display  EKG None  Radiology Dg Chest 2 View  Result Date: 05/12/2018  CLINICAL DATA:  Cough EXAM: CHEST - 2 VIEW COMPARISON:  11/26/2017 FINDINGS: The heart size and mediastinal contours are within normal limits. Both lungs are clear. The visualized skeletal structures are unremarkable. IMPRESSION: No active cardiopulmonary disease. Electronically Signed   By: Deatra Robinson M.D.   On: 05/12/2018 01:25    Procedures Procedures (including critical care time)  Medications Ordered in ED Medications  ipratropium-albuterol (DUONEB) 0.5-2.5 (3) MG/3ML nebulizer solution 3 mL (has no administration in time range)  albuterol (PROVENTIL) (2.5 MG/3ML) 0.083% nebulizer solution 2.5 mg (has no administration in time range)     Initial Impression / Assessment and Plan / ED Course  I have reviewed the triage vital signs and the nursing notes.  Pertinent labs & imaging results that were available during my care of the patient were reviewed by me and considered in my medical decision making (see chart for details).     Patient with persistent cough.  She is currently on maximal outpatient therapy except bronchodilators.  Her chest x-ray today is normal.  Patient administered a nebulizer treatment here in the ER with some improvement, will prescribe albuterol inhaler for home use.  Final Clinical Impressions(s) / ED Diagnoses   Final diagnoses:  Acute bronchitis, unspecified organism    ED Discharge Orders    None       Gilda Crease, MD 05/12/18 [phone]

## 2018-05-12 NOTE — Discharge Instructions (Addendum)
Take the tapering prednisone dose that I have prescribed until it is finished, then go back to your normal daily prednisone dosing

## 2018-05-13 DIAGNOSIS — R0602 Shortness of breath: Secondary | ICD-10-CM | POA: Diagnosis not present

## 2018-05-13 DIAGNOSIS — R062 Wheezing: Secondary | ICD-10-CM | POA: Diagnosis not present

## 2018-05-13 DIAGNOSIS — J209 Acute bronchitis, unspecified: Secondary | ICD-10-CM | POA: Diagnosis not present

## 2018-05-13 DIAGNOSIS — B370 Candidal stomatitis: Secondary | ICD-10-CM | POA: Diagnosis not present

## 2018-05-22 DIAGNOSIS — J0100 Acute maxillary sinusitis, unspecified: Secondary | ICD-10-CM | POA: Diagnosis not present

## 2018-05-30 ENCOUNTER — Encounter (HOSPITAL_COMMUNITY): Payer: Self-pay

## 2018-05-30 ENCOUNTER — Emergency Department (HOSPITAL_COMMUNITY)
Admission: EM | Admit: 2018-05-30 | Discharge: 2018-05-30 | Disposition: A | Discharge status: Home / Self Care | Payer: Medicaid Other | Attending: Emergency Medicine | Admitting: Emergency Medicine

## 2018-05-30 ENCOUNTER — Emergency Department (HOSPITAL_COMMUNITY): Payer: Medicaid Other

## 2018-05-30 ENCOUNTER — Other Ambulatory Visit: Payer: Self-pay

## 2018-05-30 DIAGNOSIS — B9789 Other viral agents as the cause of diseases classified elsewhere: Secondary | ICD-10-CM | POA: Insufficient documentation

## 2018-05-30 DIAGNOSIS — Z79899 Other long term (current) drug therapy: Secondary | ICD-10-CM | POA: Diagnosis not present

## 2018-05-30 DIAGNOSIS — R05 Cough: Secondary | ICD-10-CM | POA: Diagnosis not present

## 2018-05-30 DIAGNOSIS — J069 Acute upper respiratory infection, unspecified: Secondary | ICD-10-CM | POA: Diagnosis not present

## 2018-05-30 MED ORDER — LORATADINE-PSEUDOEPHEDRINE ER 5-120 MG PO TB12: 1.0000 | ORAL_TABLET | Freq: Two times a day (BID) | ORAL | 0 refills | Status: DC

## 2018-05-30 MED ORDER — HYDROCODONE-HOMATROPINE 5-1.5 MG/5ML PO SYRP: 5.0000 mL | ORAL_SOLUTION | Freq: Four times a day (QID) | ORAL | 0 refills | Status: DC | PRN

## 2018-05-30 MED ORDER — HYDROCOD POLST-CPM POLST ER 10-8 MG/5ML PO SUER
5.0000 mL | Freq: Once | ORAL | Status: AC
  Administered 2018-05-30: 5 mL via ORAL
  Filled 2018-05-30: qty 5

## 2018-05-30 NOTE — ED Triage Notes (Addendum)
Pt has had bronchitis, a sinus infection, strep throat, and a cough for the last 2 months. Cough just started back 2 days ago. States eyes feel like they are swollen. Headache for the last 3 days and a sore throat for the last 2. Is on her 3rd round of Clarithromycin

## 2018-05-30 NOTE — ED Provider Notes (Signed)
[facility] Provider Note   CSN: [id number] Arrival date & time: 05/30/18  1135     History   Chief Complaint Chief Complaint  Patient presents with  . Cough    HPI Kaitlin OharaBarbara Fleming is a [age] y.o. female.  Patient is a [age] year old female who presents to the emergency department with complaint of cough.  Patient states she has been sick off and on for the past 2months.  She has been treated during this time for bronchitis, strep throat, and upper respiratory symptoms.  She has been on 3 different prescriptions for clarithromycin.  She has not completed the third 1 yet.  The patient states that 2 days ago she regained a cough.  She has been having problems with some headaches, and feeling as though her eyes were swollen and seem to be a little more red than usual.  Over the past 3 days she has been having a sore throat.  She can swallow both solids and liquids.  She has had subjective temperature changes.  No diarrhea or vomiting reported.  No unusual rash noted.  The patient has not been out of the country recently.  The history is provided by the patient.  Cough  Associated symptoms: fever, headaches and sore throat   Associated symptoms: no chest pain, no eye discharge, no shortness of breath and no wheezing     Past Medical History:  Diagnosis Date  . Chronic back pain    stenosis  . Chronic pain    takes Lexapro daily  . Complication of anesthesia    pt states after surgery in 2000 had to wear a heart monitor for 3 days.  . DDD (degenerative disc disease)   . Depression   . GERD (gastroesophageal reflux disease)    takes OTC meds if needed  . History of blood transfusion    as a child  . History of bronchitis 6561yrs ago  . Hyperlipidemia    takes Fenofibrate daily  . Joint pain   . Joint swelling   . Kidney stone    has one right now on the right side but not giving any problems  . Osteoarthritis   . Peripheral edema    takes HCTZ daily   .  Pneumonia 2666yrs ago   hx of  . PONV (postoperative nausea and vomiting)   . Rheumatoid arthritis (HCC)   . Rheumatoid arthritis (HCC)   . Weakness    and tingling on left side    Patient Active Problem List   Diagnosis Date Noted  . Rheumatoid arthritis (HCC) 08/02/2017  . Influenza due to influenza virus, type B 08/02/2017  . Influenza B 08/02/2017  . Lumbar radiculopathy, chronic 06/22/2014  . Lumbar degenerative disc disease 03/10/2014  . Lumbar stenosis without neurogenic claudication 12/08/2013    Past Surgical History:  Procedure Laterality Date  . ABDOMINAL HYSTERECTOMY  2000  . BACK SURGERY  03/10/14   lumbar fusion  . LUMBAR LAMINECTOMY/DECOMPRESSION MICRODISCECTOMY Left 12/08/2013   Procedure: LEFT LUMBAR FOUR-FIVEW, LUMBAR FIVE-SACRAL ONE LAMINECTOMY;  Surgeon: Karn CassisErnesto M Botero, MD;  Location: [facility];  Service: Neurosurgery;  Laterality: Left;  . TUBAL LIGATION  24 yrs ago     OB History   No obstetric history on file.      Home Medications    Prior to Admission medications   Medication Sig Start Date End Date Taking? Authorizing Provider  folic acid (FOLVITE) 1 MG tablet Take by mouth. 10/01/17 10/01/18  [provider]  hydrochlorothiazide (HYDRODIURIL) 25 MG tablet Take 25 mg by mouth daily.    [provider]  hydrOXYzine (ATARAX/VISTARIL) 25 MG tablet Take 1 tablet (25 mg total) by mouth 3 (three) times daily as needed for itching. 11/26/17   Myrna Blazer, MD  inFLIXimab (REMICADE) 100 MG injection Inject into the vein.    [provider]  loratadine (CLARITIN) 10 MG tablet Take 1 tablet (10 mg total) by mouth at bedtime. 08/04/17   Vassie Loll, MD  predniSONE (DELTASONE) 20 MG tablet Take 2 tablets (40 mg total) by mouth daily. 11/26/17 11/26/18  Schaevitz, Myra Rude, MD  predniSONE (DELTASONE) 20 MG tablet 3 tabs po daily x 3 days, then 2.5 tabs x 3 days, then 2 tabs x 3 days, then 1 tab x 3 days 05/12/18    Gilda Crease, MD  sertraline (ZOLOFT) 25 MG tablet Take 50 tablets by mouth daily.     [provider]  Vitamin D, Ergocalciferol, (DRISDOL) 50000 units CAPS capsule Take 1 capsule (50,000 Units total) by mouth once a week. Patient not taking: Reported on 11/20/2017 08/10/17   Vassie Loll, MD    Family History No family history on file.  Social History Social History   Tobacco Use  . Smoking status: Never Smoker  . Smokeless tobacco: Never Used  Substance Use Topics  . Alcohol use: No  . Drug use: No     Allergies   Lotensin [benazepril hcl]; Plaquenil [hydroxychloroquine sulfate]; Macrolides and ketolides; Remicade [infliximab]; Doxycycline; Ibuprofen; Penicillins; and Sulfa antibiotics   Review of Systems Review of Systems  Constitutional: Positive for activity change, appetite change and fever.       All ROS Neg except as noted in HPI  HENT: Positive for congestion and sore throat. Negative for nosebleeds.   Eyes: Positive for redness. Negative for photophobia and discharge.  Respiratory: Positive for cough. Negative for shortness of breath and wheezing.   Cardiovascular: Negative for chest pain and palpitations.  Gastrointestinal: Negative for abdominal pain and blood in stool.  Genitourinary: Negative for dysuria, frequency and hematuria.  Musculoskeletal: Negative for arthralgias, back pain and neck pain.  Skin: Negative.   Neurological: Positive for headaches. Negative for dizziness, seizures and speech difficulty.  Psychiatric/Behavioral: Negative for confusion and hallucinations.     Physical Exam Updated Vital Signs BP (!) 123/96 (BP Location: Right Arm)   Pulse 88   Temp 98.9 F (37.2 C) (Oral)   Resp 20   SpO2 98%   Physical Exam Vitals signs and nursing note reviewed.  Constitutional:      Appearance: She is well-developed. She is not toxic-appearing.  HENT:     Head: Normocephalic.     Right Ear: Tympanic membrane and external  ear normal.     Left Ear: Tympanic membrane and external ear normal.     Nose: Congestion present.  Eyes:     General: Lids are normal.     Conjunctiva/sclera:     Right eye: Right conjunctiva is injected.     Left eye: Left conjunctiva is injected.     Pupils: Pupils are equal, round, and reactive to light.  Neck:     Musculoskeletal: Normal range of motion and neck supple. No neck rigidity or muscular tenderness.     Vascular: No carotid bruit.  Cardiovascular:     Rate and Rhythm: Normal rate and regular rhythm.     Pulses: Normal pulses.     Heart sounds: Normal heart  sounds.  Pulmonary:     Effort: No respiratory distress.     Breath sounds: Normal breath sounds.     Comments: Course breath sounds with occasional rhonchi. Abdominal:     General: Bowel sounds are normal.     Palpations: Abdomen is soft.     Tenderness: There is no abdominal tenderness. There is no guarding.  Musculoskeletal: Normal range of motion.  Lymphadenopathy:     Head:     Right side of head: No submandibular adenopathy.     Left side of head: No submandibular adenopathy.     Cervical: No cervical adenopathy.  Skin:    General: Skin is warm and dry.     Findings: No rash.  Neurological:     Mental Status: She is alert and oriented to person, place, and time.     Cranial Nerves: No cranial nerve deficit.     Sensory: No sensory deficit.  Psychiatric:        Speech: Speech normal.      ED Treatments / Results  Labs (all labs ordered are listed, but only abnormal results are displayed) Labs Reviewed - No data to display  EKG None  Radiology Dg Chest 2 View  Result Date: 05/30/2018 CLINICAL DATA:  Cough for a couple days with headache. History pneumonia. EXAM: CHEST - 2 VIEW COMPARISON:  Radiographs 05/12/2018 and 11/26/2017. FINDINGS: The heart size and mediastinal contours are stable. There is stable asymmetric elevation of the right hemidiaphragm with adjacent mild subsegmental  atelectasis. No airspace disease, edema, pleural effusion or pneumothorax. No acute osseous findings are evident. IMPRESSION: Stable chest with chronic right basilar atelectasis related to hemidiaphragm elevation. No acute cardiopulmonary process. Electronically Signed   By: Carey BullocksWilliam  Veazey M.D.   On: 05/30/2018 12:22    Procedures Procedures (including critical care time)  Medications Ordered in ED Medications - No data to display   Initial Impression / Assessment and Plan / ED Course  I have reviewed the triage vital signs and the nursing notes.  Pertinent labs & imaging results that were available during my care of the patient were reviewed by me and considered in my medical decision making (see chart for details).       Final Clinical Impressions(s) / ED Diagnoses MDM  Vital signs are essentially within normal limits.  The pulse oximetry is 98% on room air.  Within normal limits by my interpretation.  The patient states she has felt similar to this when she has had pneumonia in the past.  A chest x-ray was obtained and no pneumonia was noted.  The patient has a chronic right basilar atelectasis related to hemidiaphragm elevation, but no acute cardiopulmonary process was noted.  The patient speaks in complete sentences.  Capillary refill is less than 2seconds.  The mucous membranes are pink.  The examination favors upper respiratory infection.  I have asked the patient to stop the clarithromycin being that this is the third prescription for this medication.  Prescription for Hycodan and Claritin-D given to the patient.  Patient asked to use Tylenol extra strength every 4 hours for aching, and/or fever.  We discussed the importance of good hydration.  The patient is to use a mask until symptoms have resolved.  Patient is in agreement with this plan.   Final diagnoses:  Viral URI with cough    ED Discharge Orders         Ordered    HYDROcodone-homatropine (HYCODAN) 5-1.5 MG/5ML  syrup  Every 6  hours PRN     05/30/18 1305    loratadine-pseudoephedrine (CLARITIN-D 12 HOUR) 5-120 MG tablet  2 times daily     05/30/18 1305           Ivery Quale, PA-C 05/31/18 1642    Samuel Jester, DO 06/01/18 [id number]

## 2018-05-30 NOTE — Discharge Instructions (Addendum)
Your blood pressure is minimally elevated, otherwise your vital signs within normal limits.  Your oxygen level is 98% on room air.  Within normal limits by my interpretation.  Your chest x-ray shows some bronchial thickening, but no pneumonia, collapsed lung, or other emergent changes.  Your examination favors an upper respiratory infection with some sinus drainage, and mild bronchitis type symptoms.  Please increase water, juices, Gatorade, etc..  Please wash hands frequently.  Usual mask until symptoms have resolved.  Use Claritin-D every 12 hours or 2 times daily to assist with the pressure in the sinuses, as well as the drip in the back of your throat.  Use Hycodan for cough.  This medication may cause drowsiness, please use it with caution.  Please do not drive, drink alcohol, operate machinery, or participate in activities requiring concentration when taking this medication.  Please see your primary physician for recheck.

## 2018-05-30 NOTE — ED Notes (Signed)
Pt taken to xray 

## 2018-06-13 DIAGNOSIS — R062 Wheezing: Secondary | ICD-10-CM | POA: Diagnosis not present

## 2018-06-13 DIAGNOSIS — R0602 Shortness of breath: Secondary | ICD-10-CM | POA: Diagnosis not present

## 2018-06-14 DIAGNOSIS — R062 Wheezing: Secondary | ICD-10-CM | POA: Diagnosis not present

## 2018-06-14 DIAGNOSIS — R0602 Shortness of breath: Secondary | ICD-10-CM | POA: Diagnosis not present

## 2018-06-16 DIAGNOSIS — Z79891 Long term (current) use of opiate analgesic: Secondary | ICD-10-CM | POA: Diagnosis not present

## 2018-06-16 DIAGNOSIS — M069 Rheumatoid arthritis, unspecified: Secondary | ICD-10-CM | POA: Diagnosis not present

## 2018-06-16 DIAGNOSIS — G603 Idiopathic progressive neuropathy: Secondary | ICD-10-CM | POA: Diagnosis not present

## 2018-06-16 DIAGNOSIS — M25552 Pain in left hip: Secondary | ICD-10-CM | POA: Diagnosis not present

## 2018-06-16 DIAGNOSIS — M545 Low back pain: Secondary | ICD-10-CM | POA: Diagnosis not present

## 2018-07-13 DIAGNOSIS — R0602 Shortness of breath: Secondary | ICD-10-CM | POA: Diagnosis not present

## 2018-07-13 DIAGNOSIS — R062 Wheezing: Secondary | ICD-10-CM | POA: Diagnosis not present

## 2018-08-13 DIAGNOSIS — R0602 Shortness of breath: Secondary | ICD-10-CM | POA: Diagnosis not present

## 2018-08-13 DIAGNOSIS — R062 Wheezing: Secondary | ICD-10-CM | POA: Diagnosis not present

## 2018-08-15 DIAGNOSIS — J0110 Acute frontal sinusitis, unspecified: Secondary | ICD-10-CM | POA: Diagnosis not present

## 2018-09-10 DIAGNOSIS — G603 Idiopathic progressive neuropathy: Secondary | ICD-10-CM | POA: Diagnosis not present

## 2018-09-10 DIAGNOSIS — M069 Rheumatoid arthritis, unspecified: Secondary | ICD-10-CM | POA: Diagnosis not present

## 2018-09-10 DIAGNOSIS — M25552 Pain in left hip: Secondary | ICD-10-CM | POA: Diagnosis not present

## 2018-09-10 DIAGNOSIS — Z79891 Long term (current) use of opiate analgesic: Secondary | ICD-10-CM | POA: Diagnosis not present

## 2018-09-10 DIAGNOSIS — M545 Low back pain: Secondary | ICD-10-CM | POA: Diagnosis not present

## 2018-09-12 DIAGNOSIS — R0602 Shortness of breath: Secondary | ICD-10-CM | POA: Diagnosis not present

## 2018-09-12 DIAGNOSIS — R062 Wheezing: Secondary | ICD-10-CM | POA: Diagnosis not present

## 2018-10-13 DIAGNOSIS — R062 Wheezing: Secondary | ICD-10-CM | POA: Diagnosis not present

## 2018-10-13 DIAGNOSIS — R0602 Shortness of breath: Secondary | ICD-10-CM | POA: Diagnosis not present

## 2018-10-13 DIAGNOSIS — E8881 Metabolic syndrome: Secondary | ICD-10-CM | POA: Diagnosis not present

## 2018-10-13 DIAGNOSIS — I10 Essential (primary) hypertension: Secondary | ICD-10-CM | POA: Diagnosis not present

## 2018-10-13 DIAGNOSIS — M0579 Rheumatoid arthritis with rheumatoid factor of multiple sites without organ or systems involvement: Secondary | ICD-10-CM | POA: Diagnosis not present

## 2018-10-13 DIAGNOSIS — F418 Other specified anxiety disorders: Secondary | ICD-10-CM | POA: Diagnosis not present

## 2018-10-22 DIAGNOSIS — E8881 Metabolic syndrome: Secondary | ICD-10-CM | POA: Diagnosis not present

## 2018-10-22 DIAGNOSIS — M0579 Rheumatoid arthritis with rheumatoid factor of multiple sites without organ or systems involvement: Secondary | ICD-10-CM | POA: Diagnosis not present

## 2018-11-03 DIAGNOSIS — M79672 Pain in left foot: Secondary | ICD-10-CM | POA: Diagnosis not present

## 2018-11-03 DIAGNOSIS — M79641 Pain in right hand: Secondary | ICD-10-CM | POA: Diagnosis not present

## 2018-11-03 DIAGNOSIS — M0579 Rheumatoid arthritis with rheumatoid factor of multiple sites without organ or systems involvement: Secondary | ICD-10-CM | POA: Diagnosis not present

## 2018-11-03 DIAGNOSIS — M79671 Pain in right foot: Secondary | ICD-10-CM | POA: Diagnosis not present

## 2018-11-06 DIAGNOSIS — R5383 Other fatigue: Secondary | ICD-10-CM | POA: Diagnosis not present

## 2018-11-06 DIAGNOSIS — M2550 Pain in unspecified joint: Secondary | ICD-10-CM | POA: Diagnosis not present

## 2018-11-12 DIAGNOSIS — R0602 Shortness of breath: Secondary | ICD-10-CM | POA: Diagnosis not present

## 2018-11-12 DIAGNOSIS — R062 Wheezing: Secondary | ICD-10-CM | POA: Diagnosis not present

## 2018-12-08 DIAGNOSIS — M069 Rheumatoid arthritis, unspecified: Secondary | ICD-10-CM | POA: Diagnosis not present

## 2018-12-08 DIAGNOSIS — Z79891 Long term (current) use of opiate analgesic: Secondary | ICD-10-CM | POA: Diagnosis not present

## 2018-12-08 DIAGNOSIS — M25552 Pain in left hip: Secondary | ICD-10-CM | POA: Diagnosis not present

## 2018-12-08 DIAGNOSIS — G603 Idiopathic progressive neuropathy: Secondary | ICD-10-CM | POA: Diagnosis not present

## 2018-12-13 DIAGNOSIS — R062 Wheezing: Secondary | ICD-10-CM | POA: Diagnosis not present

## 2018-12-13 DIAGNOSIS — R0602 Shortness of breath: Secondary | ICD-10-CM | POA: Diagnosis not present

## 2018-12-15 DIAGNOSIS — M810 Age-related osteoporosis without current pathological fracture: Secondary | ICD-10-CM | POA: Diagnosis not present

## 2018-12-15 DIAGNOSIS — M0579 Rheumatoid arthritis with rheumatoid factor of multiple sites without organ or systems involvement: Secondary | ICD-10-CM | POA: Diagnosis not present

## 2018-12-15 DIAGNOSIS — M79641 Pain in right hand: Secondary | ICD-10-CM | POA: Diagnosis not present

## 2018-12-15 DIAGNOSIS — Z79899 Other long term (current) drug therapy: Secondary | ICD-10-CM | POA: Diagnosis not present

## 2018-12-29 DIAGNOSIS — R300 Dysuria: Secondary | ICD-10-CM | POA: Diagnosis not present

## 2019-01-07 DIAGNOSIS — Z0000 Encounter for general adult medical examination without abnormal findings: Secondary | ICD-10-CM | POA: Diagnosis not present

## 2019-01-12 DIAGNOSIS — Z1231 Encounter for screening mammogram for malignant neoplasm of breast: Secondary | ICD-10-CM | POA: Diagnosis not present

## 2019-01-13 DIAGNOSIS — R0602 Shortness of breath: Secondary | ICD-10-CM | POA: Diagnosis not present

## 2019-01-13 DIAGNOSIS — R062 Wheezing: Secondary | ICD-10-CM | POA: Diagnosis not present

## 2019-02-12 DIAGNOSIS — R062 Wheezing: Secondary | ICD-10-CM | POA: Diagnosis not present

## 2019-02-12 DIAGNOSIS — R0602 Shortness of breath: Secondary | ICD-10-CM | POA: Diagnosis not present

## 2019-02-25 DIAGNOSIS — M069 Rheumatoid arthritis, unspecified: Secondary | ICD-10-CM | POA: Diagnosis not present

## 2019-02-25 DIAGNOSIS — M25552 Pain in left hip: Secondary | ICD-10-CM | POA: Diagnosis not present

## 2019-02-25 DIAGNOSIS — M545 Low back pain: Secondary | ICD-10-CM | POA: Diagnosis not present

## 2019-02-25 DIAGNOSIS — Z79891 Long term (current) use of opiate analgesic: Secondary | ICD-10-CM | POA: Diagnosis not present

## 2019-02-25 DIAGNOSIS — G603 Idiopathic progressive neuropathy: Secondary | ICD-10-CM | POA: Diagnosis not present

## 2019-03-15 DIAGNOSIS — R0602 Shortness of breath: Secondary | ICD-10-CM | POA: Diagnosis not present

## 2019-03-15 DIAGNOSIS — R062 Wheezing: Secondary | ICD-10-CM | POA: Diagnosis not present

## 2019-03-20 ENCOUNTER — Ambulatory Visit: Payer: Medicaid Other | Admitting: Physician Assistant

## 2019-03-24 DIAGNOSIS — M810 Age-related osteoporosis without current pathological fracture: Secondary | ICD-10-CM | POA: Diagnosis not present

## 2019-03-24 DIAGNOSIS — M0579 Rheumatoid arthritis with rheumatoid factor of multiple sites without organ or systems involvement: Secondary | ICD-10-CM | POA: Diagnosis not present

## 2019-03-24 DIAGNOSIS — M79641 Pain in right hand: Secondary | ICD-10-CM | POA: Diagnosis not present

## 2019-03-24 DIAGNOSIS — Z79899 Other long term (current) drug therapy: Secondary | ICD-10-CM | POA: Diagnosis not present

## 2019-04-24 IMAGING — DX DG CHEST 2V
2 series · 2 of 2 positions shown · non-contrast
Comparison: Chest radiographs dated 07/04/2011 and chest CTA dated
05/11/2014.

CLINICAL DATA: Right chest pain for the past 1-2 days. Nausea and
vomiting since this morning.

EXAM:
CHEST - 2 VIEW

[chest pa]
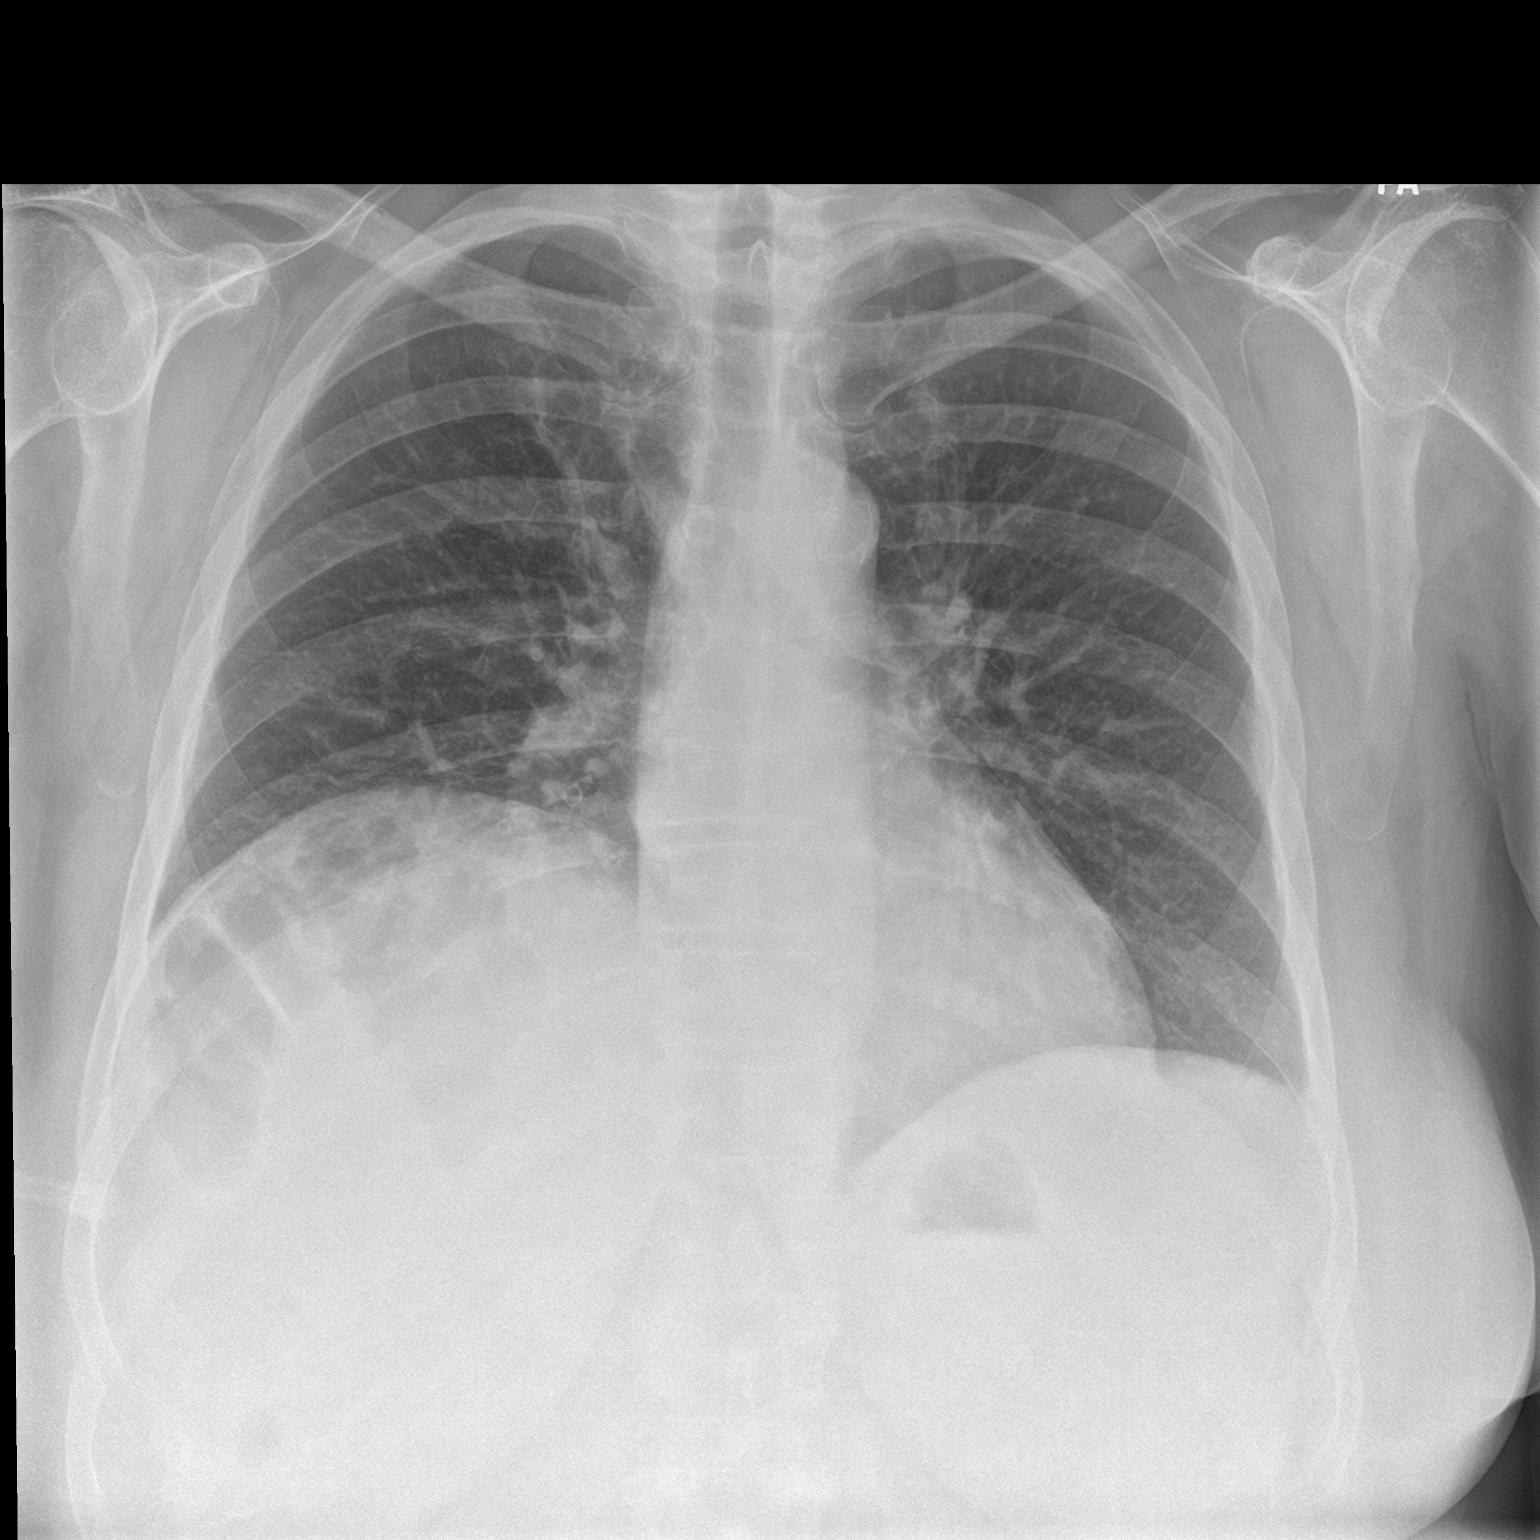

[chest lat]
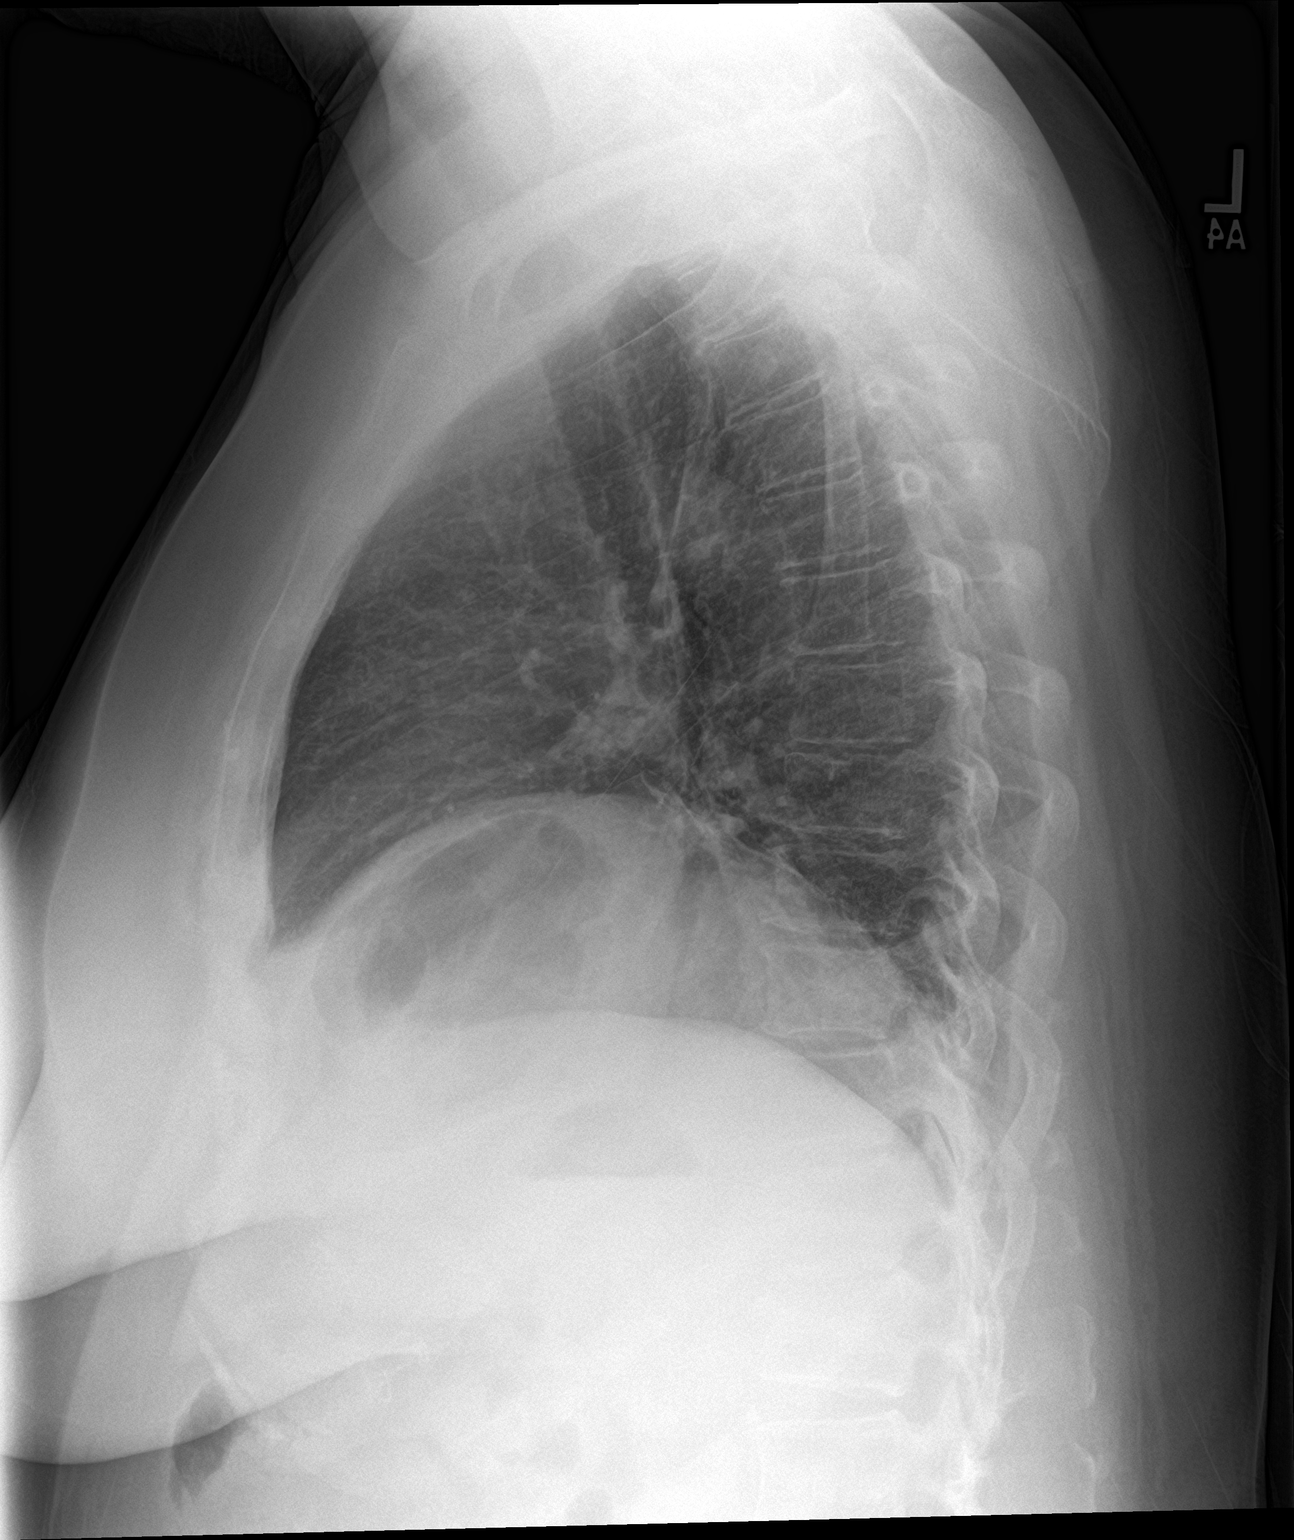

[2 of 2 positions shown; findings below may reference images not displayed]

FINDINGS: Stable elevation of the right hemidiaphragm. Normal sized heart.
Clear lungs. Mild peribronchial thickening. Minimal thoracic spine
degenerative changes
IMPRESSION: Mild bronchitic changes with progression.

## 2019-05-25 DIAGNOSIS — M069 Rheumatoid arthritis, unspecified: Secondary | ICD-10-CM | POA: Diagnosis not present

## 2019-05-25 DIAGNOSIS — G603 Idiopathic progressive neuropathy: Secondary | ICD-10-CM | POA: Diagnosis not present

## 2019-05-25 DIAGNOSIS — M545 Low back pain: Secondary | ICD-10-CM | POA: Diagnosis not present

## 2019-05-25 DIAGNOSIS — M25552 Pain in left hip: Secondary | ICD-10-CM | POA: Diagnosis not present

## 2019-05-25 DIAGNOSIS — Z79891 Long term (current) use of opiate analgesic: Secondary | ICD-10-CM | POA: Diagnosis not present

## 2019-05-29 ENCOUNTER — Encounter: Payer: Self-pay | Admitting: Physician Assistant

## 2019-05-29 ENCOUNTER — Ambulatory Visit: Payer: Medicaid Other | Admitting: Physician Assistant

## 2019-05-29 ENCOUNTER — Other Ambulatory Visit: Payer: Self-pay

## 2019-05-29 VITALS — BP 129/77 | HR 78 | Temp 97.8°F | Ht 67.0 in | Wt 195.2 lb

## 2019-05-29 DIAGNOSIS — K760 Fatty (change of) liver, not elsewhere classified: Secondary | ICD-10-CM

## 2019-05-29 DIAGNOSIS — M0579 Rheumatoid arthritis with rheumatoid factor of multiple sites without organ or systems involvement: Secondary | ICD-10-CM

## 2019-05-29 DIAGNOSIS — M5136 Other intervertebral disc degeneration, lumbar region: Secondary | ICD-10-CM | POA: Diagnosis not present

## 2019-05-29 DIAGNOSIS — R1011 Right upper quadrant pain: Secondary | ICD-10-CM

## 2019-05-29 DIAGNOSIS — K2100 Gastro-esophageal reflux disease with esophagitis, without bleeding: Secondary | ICD-10-CM | POA: Diagnosis not present

## 2019-05-29 DIAGNOSIS — Z0000 Encounter for general adult medical examination without abnormal findings: Secondary | ICD-10-CM | POA: Diagnosis not present

## 2019-05-29 DIAGNOSIS — I10 Essential (primary) hypertension: Secondary | ICD-10-CM

## 2019-05-29 DIAGNOSIS — M51369 Other intervertebral disc degeneration, lumbar region without mention of lumbar back pain or lower extremity pain: Secondary | ICD-10-CM

## 2019-05-29 MED ORDER — HYDROCHLOROTHIAZIDE 50 MG PO TABS: 25.0000 mg | ORAL_TABLET | Freq: Every day | ORAL | 1 refills | Status: DC

## 2019-05-29 MED ORDER — SERTRALINE HCL 50 MG PO TABS: 25.0000 mg | ORAL_TABLET | Freq: Every day | ORAL | 3 refills | Status: DC

## 2019-05-29 MED ORDER — LANSOPRAZOLE 30 MG PO CPDR: 30.0000 mg | DELAYED_RELEASE_CAPSULE | Freq: Every day | ORAL | 11 refills | Status: DC

## 2019-05-29 MED ORDER — CYCLOBENZAPRINE HCL 10 MG PO TABS: 10.0000 mg | ORAL_TABLET | Freq: Three times a day (TID) | ORAL | 11 refills | Status: DC | PRN

## 2019-05-29 MED ORDER — PREDNISONE 20 MG PO TABS: 10.0000 mg | ORAL_TABLET | Freq: Every day | ORAL | 5 refills | Status: DC

## 2019-05-29 NOTE — Progress Notes (Signed)
[facility]   Acute Office Visit  Subjective:    Patient ID: Kaitlin Fleming, female    DOB: 01/21/1961, 59 y.o.   MRN: [id number]  Chief Complaint  Patient presents with  . New Patient (Initial Visit)    Hypertension This is a chronic problem. The problem is controlled. Past treatments include diuretics.  Arthritis Presents for follow-up visit. She complains of pain, stiffness, joint swelling and joint warmth. The symptoms have been worsening. Affected locations include the left wrist, right wrist, left MCP, right PIP, left PIP, right DIP, right MCP, left DIP, right knee and left knee. Her pain is at a severity of 6/10. Pertinent negatives include no rash or Raynaud's syndrome. Compliance with total regimen is 76-100%.  Depression        This is a chronic problem.  The current episode started more than 1 year ago.   The problem has been resolved since onset.  Associated symptoms include myalgias.     The symptoms are aggravated by work stress and family issues.  Past treatments include SSRIs - Selective serotonin reuptake inhibitors.  Past medical history includes chronic illness and physical disability.   Gastroesophageal Reflux She complains of abdominal pain, belching, dysphagia and heartburn. She reports no choking, no coughing, no early satiety, no globus sensation, no hoarse voice or no nausea. This is a chronic problem. The current episode started more than 1 year ago. The heartburn duration is more than one hour. The heartburn is located in the RUQ. The heartburn is of moderate intensity. The heartburn does not wake her from sleep. The heartburn does not limit her activity. The heartburn changes with position. The treatment provided mild relief.     Past Medical History:  Diagnosis Date  . Chronic back pain    stenosis  . Chronic pain    takes Lexapro daily  . Complication of anesthesia    pt states after surgery in 2000 had to wear a heart monitor for 3 days.  . DDD (degenerative  disc disease)   . Depression   . GERD (gastroesophageal reflux disease)    takes OTC meds if needed  . History of blood transfusion    as a child  . History of bronchitis 4yr ago  . Hyperlipidemia    takes Fenofibrate daily  . Joint pain   . Joint swelling   . Kidney stone    has one right now on the right side but not giving any problems  . Osteoarthritis   . Peripheral edema    takes HCTZ daily   . Pneumonia 355yrago   hx of  . PONV (postoperative nausea and vomiting)   . Rheumatoid arthritis ([place]  . Rheumatoid arthritis (H[place]  . Weakness    and tingling on left side    Past Surgical History:  Procedure Laterality Date  . ABDOMINAL HYSTERECTOMY  2000  . BACK SURGERY  03/10/14   lumbar fusion  . LUMBAR LAMINECTOMY/DECOMPRESSION MICRODISCECTOMY Left 12/08/2013   Procedure: LEFT LUMBAR FOUR-FIVEW, LUMBAR FIVE-SACRAL ONE LAMINECTOMY;  Surgeon: ErFloyce StakesMD;  Location: [facility];  Service: Neurosurgery;  Laterality: Left;  . TUBAL LIGATION  24 yrs ago    History reviewed. No pertinent family history.  Social History   Socioeconomic History  . Marital status: Married    Spouse name: Not on file  . Number of children: Not on file  . Years of education: Not on file  . Highest education level: Not on file  Occupational History  . Not on file  Tobacco Use  . Smoking status: Never Smoker  . Smokeless tobacco: Never Used  Substance and Sexual Activity  . Alcohol use: No  . Drug use: No  . Sexual activity: Yes    Birth control/protection: Surgical  Other Topics Concern  . Not on file  Social History Narrative  . Not on file   Social Determinants of Health   Financial Resource Strain:   . Difficulty of Paying Living Expenses: Not on file  Food Insecurity:   . Worried About Charity fundraiser in the Last Year: Not on file  . Ran Out of Food in the Last Year: Not on file  Transportation Needs:   . Lack of Transportation (Medical): Not on file  .  Lack of Transportation (Non-Medical): Not on file  Physical Activity:   . Days of Exercise per Week: Not on file  . Minutes of Exercise per Session: Not on file  Stress:   . Feeling of Stress : Not on file  Social Connections:   . Frequency of Communication with Friends and Family: Not on file  . Frequency of Social Gatherings with Friends and Family: Not on file  . Attends Religious Services: Not on file  . Active Member of Clubs or Organizations: Not on file  . Attends Archivist Meetings: Not on file  . Marital Status: Not on file  Intimate Partner Violence:   . Fear of Current or Ex-Partner: Not on file  . Emotionally Abused: Not on file  . Physically Abused: Not on file  . Sexually Abused: Not on file    Outpatient Medications Prior to Visit  Medication Sig Dispense Refill  . cyclobenzaprine (FLEXERIL) 10 MG tablet Take 10 mg by mouth 3 (three) times daily as needed for muscle spasms.    . hydrochlorothiazide (HYDRODIURIL) 25 MG tablet Take 25 mg by mouth daily.    . lansoprazole (PREVACID) 15 MG capsule Take 15 mg by mouth daily at 12 noon.    . loratadine (CLARITIN) 10 MG tablet Take 1 tablet (10 mg total) by mouth at bedtime. 30 tablet 1  . oxyCODONE-acetaminophen (PERCOCET) 10-325 MG tablet Take 1 tablet by mouth every 6 (six) hours as needed for pain.    . predniSONE (DELTASONE) 20 MG tablet Take 10 mg by mouth daily with breakfast.    . sertraline (ZOLOFT) 25 MG tablet Take 50 tablets by mouth daily.     . valACYclovir (VALTREX) 1000 MG tablet Take 1,000 mg by mouth 2 (two) times daily.    Marland Kitchen HYDROcodone-homatropine (HYCODAN) 5-1.5 MG/5ML syrup Take 5 mLs by mouth every 6 (six) hours as needed. 120 mL 0  . hydrOXYzine (ATARAX/VISTARIL) 25 MG tablet Take 1 tablet (25 mg total) by mouth 3 (three) times daily as needed for itching. 15 tablet 0  . inFLIXimab (REMICADE) 100 MG injection Inject into the vein.    Marland Kitchen loratadine-pseudoephedrine (CLARITIN-D 12 HOUR) 5-120 MG  tablet Take 1 tablet by mouth 2 (two) times daily. 20 tablet 0  . predniSONE (DELTASONE) 20 MG tablet 3 tabs po daily x 3 days, then 2.5 tabs x 3 days, then 2 tabs x 3 days, then 1 tab x 3 days 26 tablet 0  . Vitamin D, Ergocalciferol, (DRISDOL) 50000 units CAPS capsule Take 1 capsule (50,000 Units total) by mouth once a week. (Patient not taking: Reported on 11/20/2017)     No facility-administered medications prior to visit.    Allergies  Allergen Reactions  . Lotensin [Benazepril Hcl]     Face turns red swelling of face  . Plaquenil [Hydroxychloroquine Sulfate] Nausea And Vomiting and Other (See Comments)    GI UPSET  . Macrolides And Ketolides   . Methotrexate   . Remicade [Infliximab]   . Tofacitinib Other (See Comments)    Upper respiratory infections  . Doxycycline Other (See Comments)    blisters  . Ibuprofen Hives  . Penicillins Other (See Comments)    Breaks out Has patient had a PCN reaction causing immediate rash, facial/tongue/throat swelling, SOB or lightheadedness with hypotension: yes Has patient had a PCN reaction causing severe rash involving mucus membranes or skin necrosis: no Has patient had a PCN reaction that required hospitalization: no Has patient had a PCN reaction occurring within the last 10 years:no If all of the above answers are "NO", then may proceed with Cephalosporin use.  . Sulfa Antibiotics Other (See Comments)    Breaks out    Review of Systems  Constitutional: Negative.   HENT: Negative.  Negative for hoarse voice.   Eyes: Negative.   Respiratory: Negative.  Negative for cough and choking.   Gastrointestinal: Positive for abdominal distention, abdominal pain, constipation, dysphagia and heartburn. Negative for nausea.  Genitourinary: Negative.   Musculoskeletal: Positive for arthralgias, arthritis, back pain, joint swelling, myalgias and stiffness.  Skin: Negative for rash.  Psychiatric/Behavioral: Positive for depression.         Objective:    Physical Exam Constitutional:      General: She is not in acute distress.    Appearance: Normal appearance. She is well-developed.  HENT:     Head: Normocephalic and atraumatic.  Cardiovascular:     Rate and Rhythm: Normal rate.  Pulmonary:     Effort: Pulmonary effort is normal.  Abdominal:     General: There is distension.     Palpations: There is no mass.     Tenderness: There is no rebound.     Hernia: No hernia is present.  Skin:    General: Skin is warm and dry.     Findings: No rash.  Neurological:     Mental Status: She is alert and oriented to person, place, and time.     Deep Tendon Reflexes: Reflexes are normal and symmetric.     BP 129/77   Pulse 78   Temp 97.8 F (36.6 C) (Temporal)   Ht _0  (1.702 m)   Wt 195 lb 3.2 oz (88.5 kg)   SpO2 94%   BMI 30.57 kg/m  Wt Readings from Last 3 Encounters:  05/29/19 195 lb 3.2 oz (88.5 kg)  05/12/18 175 lb (79.4 kg)  11/26/17 183 lb (83 kg)    Health Maintenance Due  Topic Date Due  . Hepatitis C Screening  1961-01-06  . TETANUS/TDAP  03/19/1979  . PAP SMEAR-Modifier  03/18/1981  . MAMMOGRAM  03/18/2010  . COLONOSCOPY  03/18/2010    There are no preventive care reminders to display for this patient.   No results found for: TSH Lab Results  Component Value Date   WBC 10.4 11/26/2017   HGB 14.2 11/26/2017   HCT 41.7 11/26/2017   MCV 80.9 11/26/2017   PLT 249 11/26/2017   Lab Results  Component Value Date   NA 134 (L) 11/26/2017   K 2.8 (L) 11/26/2017   CO2 29 11/26/2017   GLUCOSE 147 (H) 11/26/2017   BUN 6 11/26/2017   CREATININE 0.55 11/26/2017   BILITOT 0.7  08/02/2017   ALKPHOS 87 08/02/2017   AST 20 08/02/2017   ALT 18 08/02/2017   PROT 7.5 08/02/2017   ALBUMIN 3.6 08/02/2017   CALCIUM 8.7 (L) 11/26/2017   ANIONGAP 11 11/26/2017   No results found for: CHOL No results found for: HDL No results found for: LDLCALC No results found for: TRIG No results found for:  CHOLHDL No results found for: HGBA1C     Assessment & Plan:   1. Rheumatoid arthritis involving multiple sites with positive rheumatoid factor ([place]) Continue rheumatology  2. Lumbar degenerative disc disease Continue pain  3. Gastroesophageal reflux disease with esophagitis, unspecified whether hemorrhage - CMP14+EGFR; Future - Lipid Panel; Future - TSH; Future - CBC with Differential/Platelet; Future - US Abdomen Complete; Future  4. Fatty liver - CMP14+EGFR; Future - US Abdomen Complete; Future  5. Essential hypertension - CMP14+EGFR; Future  6. Well adult exam - CMP14+EGFR; Future - Lipid Panel; Future - TSH; Future - CBC with Differential/Platelet; Future  7. RUQ pain - US Abdomen Complete; Future   No orders of the defined types were placed in this encounter.    Terald Sleeper, PA-C

## 2019-06-03 ENCOUNTER — Ambulatory Visit: Payer: Medicaid Other | Admitting: Physician Assistant

## 2019-06-04 ENCOUNTER — Other Ambulatory Visit: Payer: Self-pay

## 2019-06-04 ENCOUNTER — Ambulatory Visit (HOSPITAL_COMMUNITY)
Admission: RE | Admit: 2019-06-04 | Discharge: 2019-06-04 | Disposition: A | Discharge status: Home / Self Care | Payer: Medicaid Other | Source: Ambulatory Visit | Attending: Physician Assistant | Admitting: Physician Assistant

## 2019-06-04 ENCOUNTER — Other Ambulatory Visit: Payer: Medicaid Other

## 2019-06-04 DIAGNOSIS — K2100 Gastro-esophageal reflux disease with esophagitis, without bleeding: Secondary | ICD-10-CM

## 2019-06-04 DIAGNOSIS — R1011 Right upper quadrant pain: Secondary | ICD-10-CM | POA: Diagnosis not present

## 2019-06-04 DIAGNOSIS — K760 Fatty (change of) liver, not elsewhere classified: Secondary | ICD-10-CM

## 2019-06-04 DIAGNOSIS — I10 Essential (primary) hypertension: Secondary | ICD-10-CM | POA: Diagnosis not present

## 2019-06-04 DIAGNOSIS — Z0000 Encounter for general adult medical examination without abnormal findings: Secondary | ICD-10-CM | POA: Diagnosis not present

## 2019-06-05 LAB — CBC WITH DIFFERENTIAL/PLATELET
Basophils Absolute: 0 10*3/uL (ref 0.0–0.2)
Basos: 1 %
EOS (ABSOLUTE): 0.1 10*3/uL (ref 0.0–0.4)
Eos: 3 %
Hematocrit: 39.9 % (ref 34.0–46.6)
Hemoglobin: 13 g/dL (ref 11.1–15.9)
Immature Grans (Abs): 0 10*3/uL (ref 0.0–0.1)
Immature Granulocytes: 0 %
Lymphocytes Absolute: 1.4 10*3/uL (ref 0.7–3.1)
Lymphs: 26 %
MCH: 28.8 pg (ref 26.6–33.0)
MCHC: 32.6 g/dL (ref 31.5–35.7)
MCV: 88 fL (ref 79–97)
Monocytes Absolute: 0.7 10*3/uL (ref 0.1–0.9)
Monocytes: 14 %
Neutrophils Absolute: 3 10*3/uL (ref 1.4–7.0)
Neutrophils: 56 %
Platelets: 211 10*3/uL (ref 150–450)
RBC: 4.52 x10E6/uL (ref 3.77–5.28)
RDW: 13 % (ref 11.7–15.4)
WBC: 5.3 10*3/uL (ref 3.4–10.8)

## 2019-06-05 LAB — CMP14+EGFR
ALT: 13 IU/L (ref 0–32)
AST: 15 IU/L (ref 0–40)
Albumin/Globulin Ratio: 1.2 (ref 1.2–2.2)
Albumin: 4 g/dL (ref 3.8–4.9)
Alkaline Phosphatase: 104 IU/L (ref 39–117)
BUN/Creatinine Ratio: 10 (ref 9–23)
BUN: 7 mg/dL (ref 6–24)
Bilirubin Total: 0.3 mg/dL (ref 0.0–1.2)
CO2: 28 mmol/L (ref 20–29)
Calcium: 9.3 mg/dL (ref 8.7–10.2)
Chloride: 99 mmol/L (ref 96–106)
Creatinine, Ser: 0.7 mg/dL (ref 0.57–1.00)
GFR calc Af Amer: 110 mL/min/{1.73_m2} (ref >59)
GFR calc non Af Amer: 95 mL/min/{1.73_m2} (ref >59)
Globulin, Total: 3.3 g/dL (ref 1.5–4.5)
Glucose: 93 mg/dL (ref 65–99)
Potassium: 3.7 mmol/L (ref 3.5–5.2)
Sodium: 142 mmol/L (ref 134–144)
Total Protein: 7.3 g/dL (ref 6.0–8.5)

## 2019-06-05 LAB — LIPID PANEL
Chol/HDL Ratio: 4.2 ratio (ref 0.0–4.4)
Cholesterol, Total: 242 mg/dL — ABNORMAL HIGH (ref 100–199)
HDL: 58 mg/dL (ref >39)
LDL Chol Calc (NIH): 142 mg/dL — ABNORMAL HIGH (ref 0–99)
Triglycerides: 237 mg/dL — ABNORMAL HIGH (ref 0–149)
VLDL Cholesterol Cal: 42 mg/dL — ABNORMAL HIGH (ref 5–40)

## 2019-06-05 LAB — TSH: TSH: 2.87 u[IU]/mL (ref 0.450–4.500)

## 2019-06-08 ENCOUNTER — Other Ambulatory Visit: Payer: Self-pay | Admitting: *Deleted

## 2019-06-08 DIAGNOSIS — K760 Fatty (change of) liver, not elsewhere classified: Secondary | ICD-10-CM

## 2019-06-09 ENCOUNTER — Other Ambulatory Visit: Payer: Self-pay | Admitting: Physician Assistant

## 2019-06-09 ENCOUNTER — Encounter: Payer: Self-pay | Admitting: Gastroenterology

## 2019-06-09 MED ORDER — ATORVASTATIN CALCIUM 10 MG PO TABS: 10.0000 mg | ORAL_TABLET | Freq: Every day | ORAL | 1 refills | Status: DC

## 2019-06-25 ENCOUNTER — Ambulatory Visit: Payer: Medicaid Other | Admitting: Nurse Practitioner

## 2019-06-25 ENCOUNTER — Other Ambulatory Visit: Payer: Self-pay

## 2019-06-25 ENCOUNTER — Encounter: Payer: Self-pay | Admitting: Nurse Practitioner

## 2019-06-25 DIAGNOSIS — R1319 Other dysphagia: Secondary | ICD-10-CM

## 2019-06-25 DIAGNOSIS — R1310 Dysphagia, unspecified: Secondary | ICD-10-CM | POA: Insufficient documentation

## 2019-06-25 DIAGNOSIS — K219 Gastro-esophageal reflux disease without esophagitis: Secondary | ICD-10-CM | POA: Diagnosis not present

## 2019-06-25 DIAGNOSIS — Z0000 Encounter for general adult medical examination without abnormal findings: Secondary | ICD-10-CM | POA: Diagnosis not present

## 2019-06-25 DIAGNOSIS — K760 Fatty (change of) liver, not elsewhere classified: Secondary | ICD-10-CM

## 2019-06-25 HISTORY — DX: Encounter for general adult medical examination without abnormal findings: Z00.00

## 2019-06-25 MED ORDER — PANTOPRAZOLE SODIUM 40 MG PO TBEC: 40.0000 mg | DELAYED_RELEASE_TABLET | Freq: Two times a day (BID) | ORAL | 5 refills | Status: DC

## 2019-06-25 NOTE — Assessment & Plan Note (Signed)
Persistent GERD symptoms despite lansoprazole 30 mg daily.  I will switch her to Protonix 40 mg twice daily to see if this improves her symptoms.  EGD will also help shed light on her symptoms.  Return for follow-up in 6 months.

## 2019-06-25 NOTE — Progress Notes (Signed)
Cc'ed to pcp °

## 2019-06-25 NOTE — Assessment & Plan Note (Signed)
Intermittent dysphagia symptoms typically every 5 to 6 months.  However, couple of these episodes have been quite severe and she felt she might need to present to the emergency department.  She has had recent dental work and is thinking this may be contributing.  However, she does have uncontrolled GERD symptoms.  Given her dysphagia and associated odynophagia in the setting of chronic GERD we will plan for an upper endoscopy to further evaluate with possible dilation.  Follow-up in 6 months.  GERD management above.  Proceed with EGD +/- dilation on propofol/MAC with Dr. Gala Romney in near future: the risks, benefits, and alternatives have been discussed with the patient in detail. The patient states understanding and desires to proceed.  The patient is currently on Zoloft and oxycodone.  We will plan for the procedure on propofol/MAC to promote adequate sedation.

## 2019-06-25 NOTE — Assessment & Plan Note (Signed)
The patient is asked about hepatitis C screening.  She has never been screened before.  Based on CDC recommendations a one-time screening for all adults without high risk factors we will proceed with hep C antibody treatment.  Given no high risk behaviors if this is negative this will satisfy her requirement and no further testing would be needed.  Of note she was born in 1962 which is the high risk of birth cohort of your first 43 - 1966.

## 2019-06-25 NOTE — Progress Notes (Signed)
Primary Care Physician:  Remus Loffler, PA-C Primary Gastroenterologist:  Dr. Darrick Penna  Chief Complaint  Patient presents with  . Gastroesophageal Reflux    acid coming up in throat,fatty liver    HPI:   Kaitlin Fleming is a 59 y.o. female who presents on referral from primary care for fatty liver disease.  Reviewed information associated with referral including office visit 05/29/2019 for multiple complaints including fatty liver.  History of GERD including abdominal pain, belching, dysphagia, and heartburn with no apparent dysphagia symptoms.  This is chronic started over a year ago, pain in the right upper quadrant with treatment providing mild relief.  Recommended labs and abdominal ultrasound.  Referral to GI.  Labs completed 06/04/2019 which found normal CBC, grossly elevated lipid panel including elevated total cholesterol, triglycerides, LDL, VLDL.  HDL was sufficient at 58.  CMP was normal.  TSH was normal.  Abdominal ultrasound completed 06/04/2019 found fatty liver disease without evidence of cholelithiasis or acute cholecystitis.  Specifically liver had diffusely increased echogenicity of the liver parenchyma, no mention of nodular border.  Today she states she's doing ok overall. Having GERD symptoms about twice a week despite PPI (lansoprazole 30 mg daily) which hasn't helped much. Has reflux and esophageal burning. Rare morning time nausea but no vomiting. Some solid food dysphagia about every 5-6 months. Typically worse with meats. She chews well but has had some recent dental work. Denies hematochezia, melena, fever, chills, unintentional weight loss. Has hypercholesterolemia and hypertension. No diabetes. BMI is 30. Chronic RA pain making walking difficult. Has a swimming pool, hasn't tried water aerobics. Diet is "part healthy, part unhealthy." Has not seen a nutritionist. Has lost 6 lbs in the past few weeks with diet changes.l Bakes meats instead of frying. Her "problem" is  [employer]; typically drinks 3 a day. Only 1 glass of water a day. Denies URI or flu-like symptoms. Denies loss of sense of taste or smell. Denies chest pain, dyspnea, dizziness, lightheadedness, syncope, near syncope. Denies any other upper or lower GI symptoms.  Past Medical History:  Diagnosis Date  . Chronic back pain    stenosis  . Chronic pain    takes Lexapro daily  . Complication of anesthesia    pt states after surgery in 2000 had to wear a heart monitor for 3 days.  . DDD (degenerative disc disease)   . Depression   . GERD (gastroesophageal reflux disease)    takes OTC meds if needed  . History of blood transfusion    as a child  . History of bronchitis 65yrs ago  . Hyperlipidemia    takes Fenofibrate daily  . Joint pain   . Joint swelling   . Kidney stone    has one right now on the right side but not giving any problems  . Osteoarthritis   . Peripheral edema    takes HCTZ daily   . Pneumonia 65yrs ago   hx of  . PONV (postoperative nausea and vomiting)   . Rheumatoid arthritis (HCC)   . Rheumatoid arthritis (HCC)   . Weakness    and tingling on left side    Past Surgical History:  Procedure Laterality Date  . ABDOMINAL HYSTERECTOMY  2000  . BACK SURGERY  03/10/14   lumbar fusion  . LUMBAR LAMINECTOMY/DECOMPRESSION MICRODISCECTOMY Left 12/08/2013   Procedure: LEFT LUMBAR FOUR-FIVEW, LUMBAR FIVE-SACRAL ONE LAMINECTOMY;  Surgeon: Karn Cassis, MD;  Location: [facility];  Service: Neurosurgery;  Laterality: Left;  .  TUBAL LIGATION  24 yrs ago    Current Outpatient Medications  Medication Sig Dispense Refill  . atorvastatin (LIPITOR) 10 MG tablet Take 1 tablet (10 mg total) by mouth daily. 90 tablet 1  . cyclobenzaprine (FLEXERIL) 10 MG tablet Take 1 tablet (10 mg total) by mouth 3 (three) times daily as needed for muscle spasms. (Patient taking differently: Take by mouth as needed for muscle spasms. ) 45 tablet 11  . hydrochlorothiazide (HYDRODIURIL) 50 MG  tablet Take 0.5 tablets (25 mg total) by mouth daily. (Patient taking differently: Take 25 mg by mouth every other day. ) 90 tablet 1  . lansoprazole (PREVACID) 30 MG capsule Take 1 capsule (30 mg total) by mouth daily at 12 noon. 60 capsule 11  . loratadine (CLARITIN) 10 MG tablet Take 1 tablet (10 mg total) by mouth at bedtime. 30 tablet 1  . oxyCODONE-acetaminophen (PERCOCET) 10-325 MG tablet Take 1 tablet by mouth as needed for pain.     . predniSONE (DELTASONE) 20 MG tablet Take 0.5 tablets (10 mg total) by mouth daily with breakfast. 30 tablet 5  . sertraline (ZOLOFT) 50 MG tablet Take 0.5 tablets (25 mg total) by mouth daily. 90 tablet 3  . valACYclovir (VALTREX) 1000 MG tablet Take 1,000 mg by mouth as needed.      No current facility-administered medications for this visit.    Allergies as of 06/25/2019 - Review Complete 06/25/2019  Allergen Reaction Noted  . Lotensin [benazepril hcl]  09/01/2016  . Plaquenil [hydroxychloroquine sulfate] Nausea And Vomiting and Other (See Comments) 03/21/2014  . Macrolides and ketolides  05/12/2018  . Methotrexate  12/04/2017  . Remicade [infliximab]  05/12/2018  . Tofacitinib Other (See Comments) 11/01/2016  . Doxycycline Other (See Comments) 12/02/2013  . Ibuprofen Hives 03/21/2014  . Penicillins Other (See Comments) 12/02/2013  . Sulfa antibiotics Other (See Comments) 12/02/2013    No family history on file.  Social History   Socioeconomic History  . Marital status: Married    Spouse name: Not on file  . Number of children: Not on file  . Years of education: Not on file  . Highest education level: Not on file  Occupational History  . Not on file  Tobacco Use  . Smoking status: Never Smoker  . Smokeless tobacco: Never Used  Substance and Sexual Activity  . Alcohol use: No  . Drug use: No  . Sexual activity: Yes    Birth control/protection: Surgical  Other Topics Concern  . Not on file  Social History Narrative  . Not on file    Social Determinants of Health   Financial Resource Strain:   . Difficulty of Paying Living Expenses: Not on file  Food Insecurity:   . Worried About Programme researcher, broadcasting/film/video in the Last Year: Not on file  . Ran Out of Food in the Last Year: Not on file  Transportation Needs:   . Lack of Transportation (Medical): Not on file  . Lack of Transportation (Non-Medical): Not on file  Physical Activity:   . Days of Exercise per Week: Not on file  . Minutes of Exercise per Session: Not on file  Stress:   . Feeling of Stress : Not on file  Social Connections:   . Frequency of Communication with Friends and Family: Not on file  . Frequency of Social Gatherings with Friends and Family: Not on file  . Attends Religious Services: Not on file  . Active Member of Clubs or Organizations: Not on  file  . Attends Archivist Meetings: Not on file  . Marital Status: Not on file  Intimate Partner Violence:   . Fear of Current or Ex-Partner: Not on file  . Emotionally Abused: Not on file  . Physically Abused: Not on file  . Sexually Abused: Not on file    Review of Systems: General: Negative for anorexia, weight loss, fever, chills, fatigue, weakness. ENT: Negative for hoarseness, difficulty swallowing. CV: Negative for chest pain, angina, palpitations, peripheral edema.  Respiratory: Negative for dyspnea at rest, cough, sputum, wheezing.  GI: See history of present illness. MS: Chronic RA pain.  Derm: Negative for rash or itching.  Endo: Negative for unusual weight change.  Heme: Negative for bruising or bleeding. Allergy: Negative for rash or hives.    Physical Exam: BP (!) 148/88   Pulse 83   Temp (!) 97.3 F (36.3 C) (Temporal)   Ht 5\' 7"  (1.702 m)   Wt 192 lb 9.6 oz (87.4 kg)   BMI 30.17 kg/m  General:   Alert and oriented. Pleasant and cooperative. Well-nourished and well-developed.  Head:  Normocephalic and atraumatic. Eyes:  Without icterus, sclera clear and conjunctiva  pink.  Ears:  Normal auditory acuity. Cardiovascular:  S1, S2 present without murmurs appreciated. Extremities without clubbing or edema. Respiratory:  Clear to auscultation bilaterally. No wheezes, rales, or rhonchi. No distress.  Gastrointestinal:  +BS, soft, non-tender and non-distended. No HSM noted. No guarding or rebound. No masses appreciated.  Rectal:  Deferred  Musculoskalatal:  Symmetrical without gross deformities. Neurologic:  Alert and oriented x4;  grossly normal neurologically. Psych:  Alert and cooperative. Normal mood and affect. Heme/Lymph/Immune: No excessive bruising noted.    06/25/2019 9:30 AM   Disclaimer: This note was dictated with voice recognition software. Similar sounding words can inadvertently be transcribed and may not be corrected upon review.

## 2019-06-25 NOTE — Assessment & Plan Note (Signed)
Noted fatty liver disease on abdominal ultrasound.  Most recent LFTs 3 weeks ago were normal.  The patient does have hypertension, hyperlipidemia/hypercholesterolemia, overweight.  She does not have diabetes.  She is at risk for fatty liver disease.  I discussed with her the hallmarks of treatment including diet and exercise.  Provided written information about diet.  I have offered dietitian consult if desired.  She has rheumatoid arthritis which limits her ability to exercise.  She does have a pool at home and we discussed water aerobics.  I also discussed chair yoga and chair resistance training.  Reinforced diet and exercise and follow-up in 6 months.

## 2019-06-25 NOTE — Patient Instructions (Addendum)
Your health issues we discussed today were:   GERD (reflux/heartburn): Start taking lansoprazole. I sent a prescription to your pharmacy for Protonix 40 mg twice daily.  Take this first thing in the morning before you eat and 30 minutes for your last meal the day Avoid trigger foods that can make your symptoms worse  Dysphagia (swallowing difficulties): 1. We will schedule an upper endoscopy with possible dilation ("esophagus stretching") to help your symptoms 2. Call us or have any worsening or severe symptoms  Fatty liver disease: 1. As we discussed the hallmarks of treatment of fatty liver disease include diet and exercise 2. Strive for better cholesterol, blood pressure control, moderate, sustained weight loss. 3. As we discussed, given your rheumatoid arthritis you should not look into chair aerobics, chair resistance training, and water aerobics when it becomes worn off 4. We can refer you to a dietitian if you decide you would like to  Overall I recommend:  1. I have put in an order for hepatitis C screening based on CDC recommendations 2. Continue your other current medications 3. Return for follow-up in 6 months 4. Call us for any questions or concerns   ---------------------------------------------------------------  COVID-19 Vaccine Information can be found at: [email or web address] For questions related to vaccine distribution or appointments, please email [email or web address] or call [phone].   ---------------------------------------------------------------   At [facility] we value your feedback. You may receive a survey about your visit today. Please share your experience as we strive to create trusting relationships with our patients to provide genuine, compassionate, quality care.  We appreciate your understanding and patience as we review any laboratory studies, imaging, and other  diagnostic tests that are ordered as we care for you. Our office policy is 5 business days for review of these results, and any emergent or urgent results are addressed in a timely manner for your best interest. If you do not hear from our office in 1 week, please contact us.   We also encourage the use of MyChart, which contains your medical information for your review as well. If you are not enrolled in this feature, an access code is on this after visit summary for your convenience. Thank you for allowing [place] to be involved in your care.  It was great to see you today!  I hope you have a great day!!      Fatty Liver Disease  Fatty liver disease occurs when too much fat has built up in your liver cells. Fatty liver disease is also called hepatic steatosis or steatohepatitis. The liver removes harmful substances from your bloodstream and produces fluids that your body needs. It also helps your body use and store energy from the food you eat. In many cases, fatty liver disease does not cause symptoms or problems. It is often diagnosed when tests are being done for other reasons. However, over time, fatty liver can cause inflammation that may lead to more serious liver problems, such as scarring of the liver (cirrhosis) and liver failure. Fatty liver is associated with insulin resistance, increased body fat, high blood pressure (hypertension), and high cholesterol. These are features of metabolic syndrome and increase your risk for stroke, diabetes, and heart disease. What are the causes? This condition may be caused by:  Drinking too much alcohol.  Poor nutrition.  Obesity.  Cushing's syndrome.  Diabetes.  High cholesterol.  Certain drugs.  Poisons.  Some viral infections.  Pregnancy. What increases the risk? You are more likely to develop this condition if  you:  Abuse alcohol.  Are overweight.  Have diabetes.  Have hepatitis.  Have a high triglyceride level.  Are  pregnant. What are the signs or symptoms? Fatty liver disease often does not cause symptoms. If symptoms do develop, they can include:  Fatigue.  Weakness.  Weight loss.  Confusion.  Abdominal pain.  Nausea and vomiting.  Yellowing of your skin and the white parts of your eyes (jaundice).  Itchy skin. How is this diagnosed? This condition may be diagnosed by:  A physical exam and medical history.  Blood tests.  Imaging tests, such as an ultrasound, CT scan, or MRI.  A liver biopsy. A small sample of liver tissue is removed using a needle. The sample is then looked at under a microscope. How is this treated? Fatty liver disease is often caused by other health conditions. Treatment for fatty liver may involve medicines and lifestyle changes to manage conditions such as:  Alcoholism.  High cholesterol.  Diabetes.  Being overweight or obese. Follow these instructions at home:   Do not drink alcohol. If you have trouble quitting, ask your health care provider how to safely quit with the help of medicine or a supervised program. This is important to keep your condition from getting worse.  Eat a healthy diet as told by your health care provider. Ask your health care provider about working with a diet and nutrition specialist (dietitian) to develop an eating plan.  Exercise regularly. This can help you lose weight and control your cholesterol and diabetes. Talk to your health care provider about an exercise plan and which activities are best for you.  Take over-the-counter and prescription medicines only as told by your health care provider.  Keep all follow-up visits as told by your health care provider. This is important. Contact a health care provider if: You have trouble controlling your:  Blood sugar. This is especially important if you have diabetes.  Cholesterol.  Drinking of alcohol. Get help right away if:  You have abdominal pain.  You have  jaundice.  You have nausea and vomiting.  You vomit blood or material that looks like coffee grounds.  You have stools that are black, tar-like, or bloody. Summary  Fatty liver disease develops when too much fat builds up in the cells of your liver.  Fatty liver disease often causes no symptoms or problems. However, over time, fatty liver can cause inflammation that may lead to more serious liver problems, such as scarring of the liver (cirrhosis).  You are more likely to develop this condition if you abuse alcohol, are pregnant, are overweight, have diabetes, have hepatitis, or have high triglyceride levels.  Contact your health care provider if you have trouble controlling your weight, blood sugar, cholesterol, or drinking of alcohol. This information is not intended to replace advice given to you by your health care provider. Make sure you discuss any questions you have with your health care provider. Document Revised: 04/12/2017 Document Reviewed: 02/06/2017 Elsevier Patient Education  2020 [employer].

## 2019-07-03 DIAGNOSIS — M79641 Pain in right hand: Secondary | ICD-10-CM | POA: Diagnosis not present

## 2019-07-03 DIAGNOSIS — M810 Age-related osteoporosis without current pathological fracture: Secondary | ICD-10-CM | POA: Diagnosis not present

## 2019-07-03 DIAGNOSIS — M0579 Rheumatoid arthritis with rheumatoid factor of multiple sites without organ or systems involvement: Secondary | ICD-10-CM | POA: Diagnosis not present

## 2019-07-06 DIAGNOSIS — K760 Fatty (change of) liver, not elsewhere classified: Secondary | ICD-10-CM | POA: Diagnosis not present

## 2019-07-06 DIAGNOSIS — R1310 Dysphagia, unspecified: Secondary | ICD-10-CM | POA: Diagnosis not present

## 2019-07-06 DIAGNOSIS — K219 Gastro-esophageal reflux disease without esophagitis: Secondary | ICD-10-CM | POA: Diagnosis not present

## 2019-07-06 DIAGNOSIS — Z0000 Encounter for general adult medical examination without abnormal findings: Secondary | ICD-10-CM | POA: Diagnosis not present

## 2019-07-07 LAB — HEPATITIS C ANTIBODY
Hepatitis C Ab: NONREACTIVE
SIGNAL TO CUT-OFF: 0.01 (ref <1.00)

## 2019-08-17 DIAGNOSIS — G603 Idiopathic progressive neuropathy: Secondary | ICD-10-CM | POA: Diagnosis not present

## 2019-08-17 DIAGNOSIS — M545 Low back pain, unspecified: Secondary | ICD-10-CM | POA: Insufficient documentation

## 2019-08-17 DIAGNOSIS — Z79891 Long term (current) use of opiate analgesic: Secondary | ICD-10-CM | POA: Diagnosis not present

## 2019-08-17 DIAGNOSIS — M25552 Pain in left hip: Secondary | ICD-10-CM | POA: Diagnosis not present

## 2019-08-17 DIAGNOSIS — M069 Rheumatoid arthritis, unspecified: Secondary | ICD-10-CM | POA: Diagnosis not present

## 2019-08-18 IMAGING — DX DG CHEST 1V
1 series · 1 of 1 positions shown · non-contrast
Comparison: None.

CLINICAL DATA: Apparent allergic reaction.  Shortness of breath.

EXAM:
CHEST  1 VIEW

[chest ap]
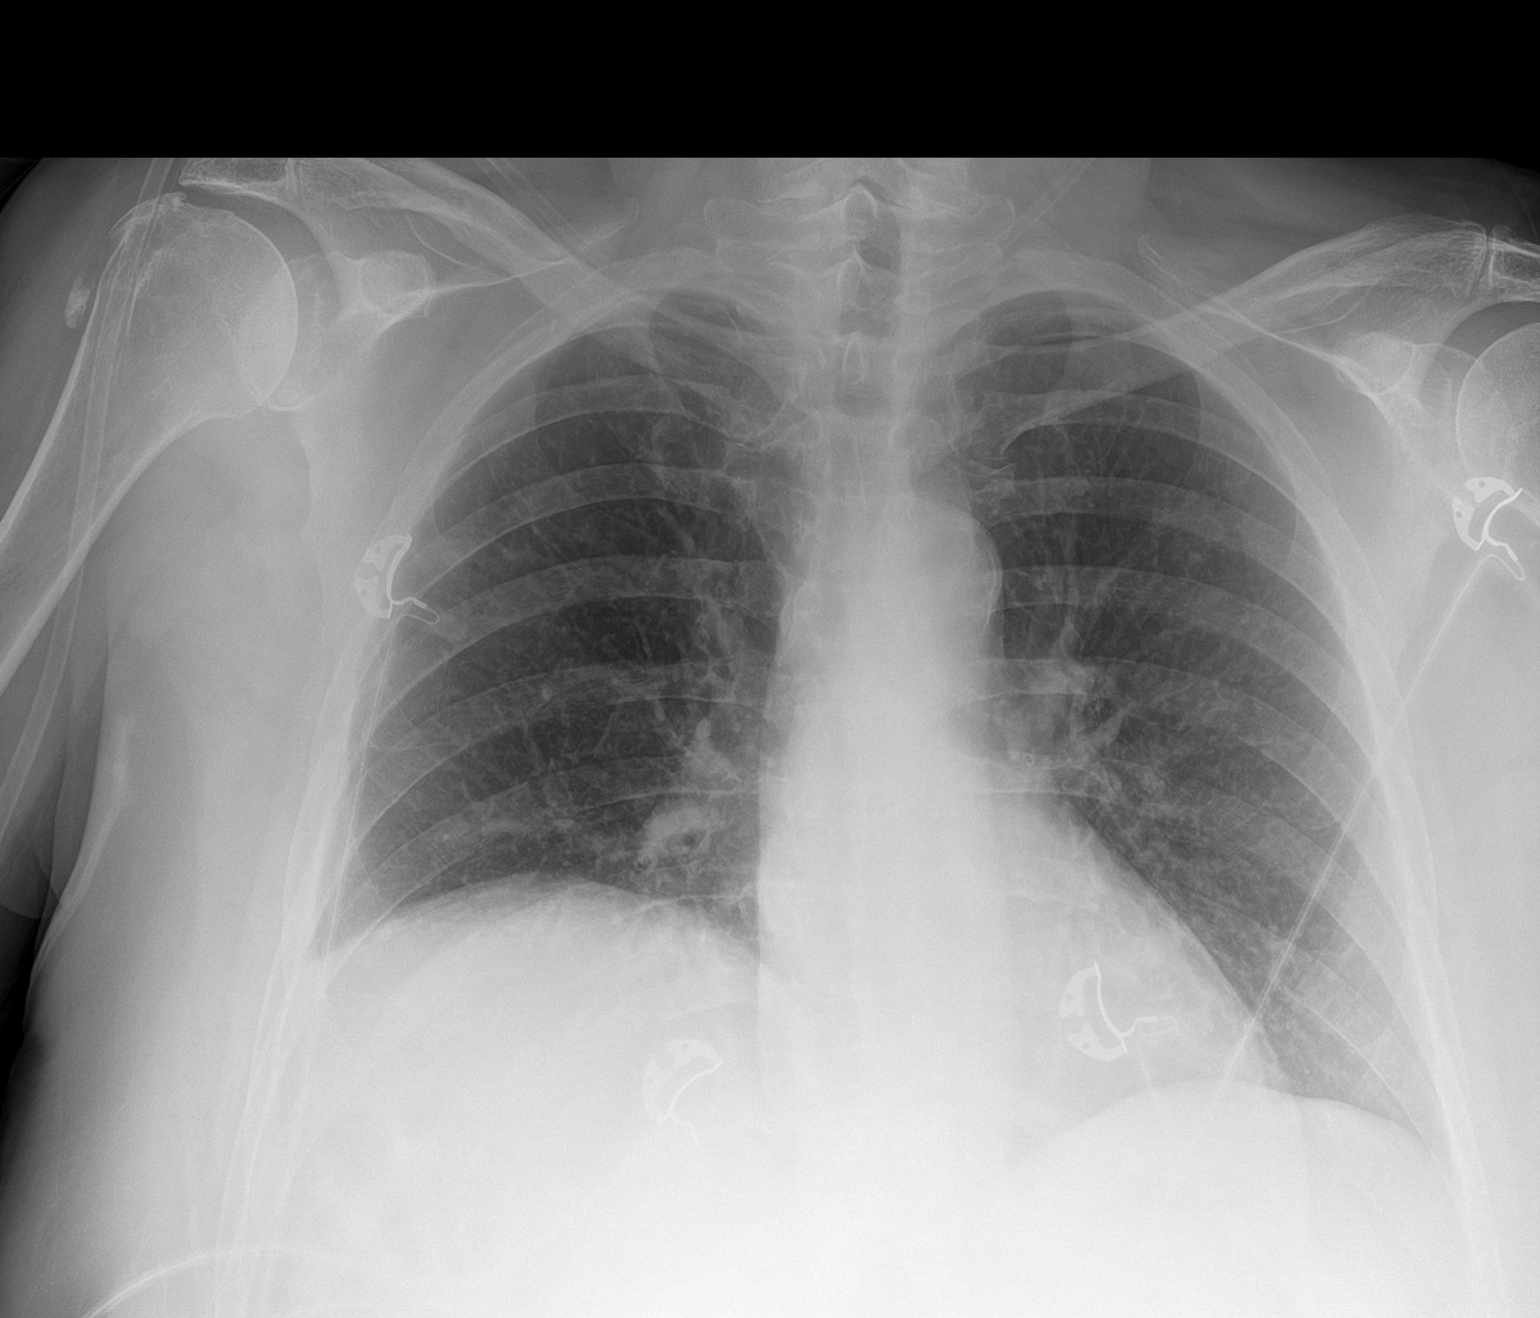

[1 of 1 positions shown; findings below may reference images not displayed]

FINDINGS: There is slight right base atelectasis. Lungs are otherwise clear.
The heart size and pulmonary vascularity are normal. No adenopathy.
There is aortic atherosclerosis. There is degenerative change in the
right shoulder region.
IMPRESSION: Slight right base atelectasis. No edema or consolidation. Aortic
atherosclerosis.

Aortic Atherosclerosis ([id number]-CD6.6).

## 2019-08-19 ENCOUNTER — Telehealth: Payer: Self-pay | Admitting: Physician Assistant

## 2019-08-19 MED ORDER — SERTRALINE HCL 50 MG PO TABS: 50.0000 mg | ORAL_TABLET | Freq: Every day | ORAL | 3 refills | Status: DC

## 2019-08-19 NOTE — Addendum Note (Signed)
Addended by: Jannifer Rodney A on: 08/19/2019 01:26 PM   Modules accepted: Orders

## 2019-08-19 NOTE — Telephone Encounter (Signed)
°  Prescription Request  08/19/2019  What is the name of the medication or equipment? sertraline (ZOLOFT) 50 MG tablet/ pt said Kaitlin Fleming changed it to 1/2 a pill because pt wanted to try it. She said it is not working and she needs to go back to a whole pill. Pharmacy said she would need new rx  Have you contacted your pharmacy to request a refill? (if applicable) yes  Which pharmacy would you like this sent to? [facility] drug [facility]   Patient notified that their request is being sent to the clinical staff for review and that they should receive a response within 2 business days.

## 2019-08-19 NOTE — Telephone Encounter (Signed)
Prescription sent to pharmacy.

## 2019-08-24 ENCOUNTER — Telehealth: Payer: Self-pay | Admitting: Emergency Medicine

## 2019-08-24 DIAGNOSIS — R1319 Other dysphagia: Secondary | ICD-10-CM

## 2019-08-24 DIAGNOSIS — R1310 Dysphagia, unspecified: Secondary | ICD-10-CM

## 2019-08-24 DIAGNOSIS — K219 Gastro-esophageal reflux disease without esophagitis: Secondary | ICD-10-CM

## 2019-08-24 NOTE — Telephone Encounter (Signed)
Pt called and stated the protonix 40mg  bid  is not helping and never really has and wants to know if there is anything else you can place her on for acid reflux. She has a procedure with doctor rmr  In two weeks and a f/u appt in aug. Pt wants it sent into "the drug store" in [place]

## 2019-08-25 MED ORDER — ESOMEPRAZOLE MAGNESIUM 40 MG PO CPDR: 40.0000 mg | DELAYED_RELEASE_CAPSULE | Freq: Two times a day (BID) | ORAL | 3 refills | Status: DC

## 2019-08-25 NOTE — Telephone Encounter (Signed)
Yes, we can try a different PPI. There are several of them and sometimes it takes chaging a couple times that finds one that works well for her. Has tried/failed lansoprazole and pantoprazole. I'll send in an Rx for esomeprazole (Nexium). Call if any problems at the pharmacy.

## 2019-08-25 NOTE — Telephone Encounter (Signed)
Called lmom

## 2019-08-25 NOTE — Addendum Note (Signed)
Addended by: Delane Ginger, Fanny Agan A on: 08/25/2019 02:02 PM   Modules accepted: Orders

## 2019-08-26 NOTE — Telephone Encounter (Signed)
Notified pt nexium was sent into the drug store

## 2019-08-28 ENCOUNTER — Ambulatory Visit: Payer: Medicaid Other | Admitting: Physician Assistant

## 2019-09-04 NOTE — Patient Instructions (Signed)
50    Your procedure is scheduled on: 09/10/2019  Report to [facility] at  8:30   AM.  Call this number if you have problems the morning of surgery: [phone]   Remember:   Do not drink or eat food:After Midnight.        No Smoking the day of procedure      Take these medicines the morning of surgery with A SIP OF WATER: Nexium, Claritin, protonix, zoloft and prednisone   Do not wear jewelry, make-up or nail polish.  Do not wear lotions, powders, or perfumes. You may wear deodorant.                Do not bring valuables to the hospital.  Contacts, dentures or bridgework may not be worn into surgery.  Leave suitcase in the car. After surgery it may be brought to your room.  For patients admitted to the hospital, checkout time is 11:00 AM the day of discharge.   Patients discharged the day of surgery will not be allowed to drive home. Upper Endoscopy, Adult Upper endoscopy is a procedure to look inside the upper GI (gastrointestinal) tract. The upper GI tract is made up of:  The part of the body that moves food from your mouth to your stomach (esophagus).  The stomach.  The first part of your small intestine (duodenum). This procedure is also called esophagogastroduodenoscopy (EGD) or gastroscopy. In this procedure, your health care provider passes a thin, flexible tube (endoscope) through your mouth and down your esophagus into your stomach. A small camera is attached to the end of the tube. Images from the camera appear on a monitor in the exam room. During this procedure, your health care provider may also remove a small piece of tissue to be sent to a lab and examined under a microscope (biopsy). Your health care provider may do an upper endoscopy to diagnose cancers of the upper GI tract. You may also have this procedure to find the cause of other conditions, such as:  Stomach pain.  Heartburn.  Pain or problems when swallowing.  Nausea and vomiting.  Stomach bleeding.   Stomach ulcers. Tell a health care provider about:  Any allergies you have.  All medicines you are taking, including vitamins, herbs, eye drops, creams, and over-the-counter medicines.  Any problems you or family members have had with anesthetic medicines.  Any blood disorders you have.  Any surgeries you have had.  Any medical conditions you have.  Whether you are pregnant or may be pregnant. What are the risks? Generally, this is a safe procedure. However, problems may occur, including:  Infection.  Bleeding.  Allergic reactions to medicines.  A tear or hole (perforation) in the esophagus, stomach, or duodenum. What happens before the procedure? Staying hydrated Follow instructions from your health care provider about hydration, which may include:  Up to 2 hours before the procedure - you may continue to drink clear liquids, such as water, clear fruit juice, black coffee, and plain tea.  Eating and drinking restrictions Follow instructions from your health care provider about eating and drinking, which may include:  8 hours before the procedure - stop eating heavy meals or foods, such as meat, fried foods, or fatty foods.  6 hours before the procedure - stop eating light meals or foods, such as toast or cereal.  6 hours before the procedure - stop drinking milk or drinks that contain milk.  2 hours before the procedure - stop drinking  clear liquids. Medicines Ask your health care provider about:  Changing or stopping your regular medicines. This is especially important if you are taking diabetes medicines or blood thinners.  Taking medicines such as aspirin and ibuprofen. These medicines can thin your blood. Do not take these medicines unless your health care provider tells you to take them.  Taking over-the-counter medicines, vitamins, herbs, and supplements. General instructions  Plan to have someone take you home from the hospital or clinic.  If you will be  going home right after the procedure, plan to have someone with you for 24 hours.  Ask your health care provider what steps will be taken to help prevent infection. What happens during the procedure?  1. An IV will be inserted into one of your veins. 2. You may be given one or more of the following: ? A medicine to help you relax (sedative). ? A medicine to numb the throat (local anesthetic). 3. You will lie on your left side on an exam table. 4. Your health care provider will pass the endoscope through your mouth and down your esophagus. 5. Your health care provider will use the scope to check the inside of your esophagus, stomach, and duodenum. Biopsies may be taken. 6. The endoscope will be removed. The procedure may vary among health care providers and hospitals. What happens after the procedure?  Your blood pressure, heart rate, breathing rate, and blood oxygen level will be monitored until you leave the hospital or clinic.  Do not drive for 24 hours if you were given a sedative during your procedure.  When your throat is no longer numb, you may be given some fluids to drink.  It is up to you to get the results of your procedure. Ask your health care provider, or the department that is doing the procedure, when your results will be ready. Summary  Upper endoscopy is a procedure to look inside the upper GI tract.  During the procedure, an IV will be inserted into one of your veins. You may be given a medicine to help you relax.  A medicine will be used to numb your throat.  The endoscope will be passed through your mouth and down your esophagus. This information is not intended to replace advice given to you by your health care provider. Make sure you discuss any questions you have with your health care provider. Document Revised: 10/23/2017 Document Reviewed: 09/30/2017 Elsevier Patient Education  2020 [employer]                                                                                                                                       EndoscopyCare After  Please read the instructions outlined below and refer to this sheet in the next few weeks. These discharge instructions provide you with general information on caring for yourself after you leave the hospital. Your doctor may also give you specific  instructions. While your treatment has been planned according to the most current medical practices available, unavoidable complications occasionally occur. If you have any problems or questions after discharge, please call your doctor. HOME CARE INSTRUCTIONS Activity  You may resume your regular activity but move at a slower pace for the next 24 hours.   Take frequent rest periods for the next 24 hours.   Walking will help expel (get rid of) the air and reduce the bloated feeling in your abdomen.   No driving for 24 hours (because of the anesthesia (medicine) used during the test).   You may shower.   Do not sign any important legal documents or operate any machinery for 24 hours (because of the anesthesia used during the test).  Nutrition  Drink plenty of fluids.   You may resume your normal diet.   Begin with a light meal and progress to your normal diet.   Avoid alcoholic beverages for 24 hours or as instructed by your caregiver.  Medications You may resume your normal medications unless your caregiver tells you otherwise. What you can expect today  You may experience abdominal discomfort such as a feeling of fullness or "gas" pains.   You may experience a sore throat for 2 to 3 days. This is normal. Gargling with salt water may help this.  Follow-up Your doctor will discuss the results of your test with you. SEEK IMMEDIATE MEDICAL CARE IF:  You have excessive nausea (feeling sick to your stomach) and/or vomiting.   You have severe abdominal pain and distention (swelling).   You have trouble swallowing.   You have a temperature  over 100 F (37.8 C).   You have rectal bleeding or vomiting of blood.  Document Released: 12/13/2003 Document Revised: 04/19/2011 Document Reviewed: 06/25/2007

## 2019-09-06 DIAGNOSIS — J029 Acute pharyngitis, unspecified: Secondary | ICD-10-CM | POA: Diagnosis not present

## 2019-09-06 DIAGNOSIS — H9202 Otalgia, left ear: Secondary | ICD-10-CM | POA: Diagnosis not present

## 2019-09-07 ENCOUNTER — Telehealth: Payer: Self-pay | Admitting: Gastroenterology

## 2019-09-07 NOTE — Telephone Encounter (Signed)
Called pt, EGD/-/+DIL w/Prop w/RMR rescheduled to 11/19/19 at 3:00pm. LMOVM for endo scheduler.

## 2019-09-07 NOTE — Telephone Encounter (Signed)
PLEASE CALL PATIENT TO RESCHEDULE HER PROCEDURE   HAS AN EAR INFECTION

## 2019-09-07 NOTE — Telephone Encounter (Signed)
Pre-op and COVID test 11/17/19. Letter mailed with new procedure instructions.

## 2019-09-08 ENCOUNTER — Other Ambulatory Visit (HOSPITAL_COMMUNITY): Payer: Medicaid Other

## 2019-09-08 ENCOUNTER — Encounter (HOSPITAL_COMMUNITY)
Admission: RE | Admit: 2019-09-08 | Discharge: 2019-09-08 | Disposition: A | Discharge status: Home / Self Care | Payer: Medicaid Other | Source: Ambulatory Visit | Attending: Internal Medicine | Admitting: Internal Medicine

## 2019-09-29 DIAGNOSIS — M06321 Rheumatoid nodule, right elbow: Secondary | ICD-10-CM | POA: Diagnosis not present

## 2019-09-29 DIAGNOSIS — M810 Age-related osteoporosis without current pathological fracture: Secondary | ICD-10-CM | POA: Diagnosis not present

## 2019-09-29 DIAGNOSIS — M0579 Rheumatoid arthritis with rheumatoid factor of multiple sites without organ or systems involvement: Secondary | ICD-10-CM | POA: Diagnosis not present

## 2019-10-06 DIAGNOSIS — L03116 Cellulitis of left lower limb: Secondary | ICD-10-CM | POA: Diagnosis not present

## 2019-10-06 DIAGNOSIS — W57XXXA Bitten or stung by nonvenomous insect and other nonvenomous arthropods, initial encounter: Secondary | ICD-10-CM | POA: Diagnosis not present

## 2019-10-06 DIAGNOSIS — R110 Nausea: Secondary | ICD-10-CM | POA: Diagnosis not present

## 2019-10-15 DIAGNOSIS — R21 Rash and other nonspecific skin eruption: Secondary | ICD-10-CM | POA: Diagnosis not present

## 2019-10-15 DIAGNOSIS — R110 Nausea: Secondary | ICD-10-CM | POA: Diagnosis not present

## 2019-10-15 DIAGNOSIS — W57XXXA Bitten or stung by nonvenomous insect and other nonvenomous arthropods, initial encounter: Secondary | ICD-10-CM | POA: Diagnosis not present

## 2019-11-12 NOTE — Patient Instructions (Signed)
20    Your procedure is scheduled on: 11/19/2019  Report to [facility] at   1:00  PM.  Call this number if you have problems the morning of surgery: [phone]   Remember:   Do not drink or eat food:After Midnight.        No Smoking the day of procedure      Take these medicines the morning of surgery with A SIP OF WATER: Nexium, Flexeril, claritin, Zoloft and Prednisone   Do not wear jewelry, make-up or nail polish.  Do not wear lotions, powders, or perfumes. You may wear deodorant.                Do not bring valuables to the hospital.  Contacts, dentures or bridgework may not be worn into surgery.  Leave suitcase in the car. After surgery it may be brought to your room.  For patients admitted to the hospital, checkout time is 11:00 AM the day of discharge.   Patients discharged the day of surgery will not be allowed to drive home. Upper Endoscopy, Adult Upper endoscopy is a procedure to look inside the upper GI (gastrointestinal) tract. The upper GI tract is made up of:  The part of the body that moves food from your mouth to your stomach (esophagus).  The stomach.  The first part of your small intestine (duodenum). This procedure is also called esophagogastroduodenoscopy (EGD) or gastroscopy. In this procedure, your health care provider passes a thin, flexible tube (endoscope) through your mouth and down your esophagus into your stomach. A small camera is attached to the end of the tube. Images from the camera appear on a monitor in the exam room. During this procedure, your health care provider may also remove a small piece of tissue to be sent to a lab and examined under a microscope (biopsy). Your health care provider may do an upper endoscopy to diagnose cancers of the upper GI tract. You may also have this procedure to find the cause of other conditions, such as:  Stomach pain.  Heartburn.  Pain or problems when swallowing.  Nausea and vomiting.  Stomach bleeding.   Stomach ulcers. Tell a health care provider about:  Any allergies you have.  All medicines you are taking, including vitamins, herbs, eye drops, creams, and over-the-counter medicines.  Any problems you or family members have had with anesthetic medicines.  Any blood disorders you have.  Any surgeries you have had.  Any medical conditions you have.  Whether you are pregnant or may be pregnant. What are the risks? Generally, this is a safe procedure. However, problems may occur, including:  Infection.  Bleeding.  Allergic reactions to medicines.  A tear or hole (perforation) in the esophagus, stomach, or duodenum. What happens before the procedure? Staying hydrated Follow instructions from your health care provider about hydration, which may include:  Up to 4 hours before the procedure - you may continue to drink clear liquids, such as water, clear fruit juice, black coffee, and plain tea.   . Medicines Ask your health care provider about:  Changing or stopping your regular medicines. This is especially important if you are taking diabetes medicines or blood thinners.  Taking medicines such as aspirin and ibuprofen. These medicines can thin your blood. Do not take these medicines unless your health care provider tells you to take them.  Taking over-the-counter medicines, vitamins, herbs, and supplements. General instructions  Plan to have someone take you home from the hospital or clinic.  If you will be going home right after the procedure, plan to have someone with you for 24 hours.  Ask your health care provider what steps will be taken to help prevent infection. What happens during the procedure?  1. An IV will be inserted into one of your veins. 2. You may be given one or more of the following: ? A medicine to help you relax (sedative). ? A medicine to numb the throat (local anesthetic). 3. You will lie on your left side on an exam table. 4. Your health  care provider will pass the endoscope through your mouth and down your esophagus. 5. Your health care provider will use the scope to check the inside of your esophagus, stomach, and duodenum. Biopsies may be taken. 6. The endoscope will be removed. The procedure may vary among health care providers and hospitals. What happens after the procedure?  Your blood pressure, heart rate, breathing rate, and blood oxygen level will be monitored until you leave the hospital or clinic.  Do not drive for 24 hours if you were given a sedative during your procedure.  When your throat is no longer numb, you may be given some fluids to drink.  It is up to you to get the results of your procedure. Ask your health care provider, or the department that is doing the procedure, when your results will be ready. Summary  Upper endoscopy is a procedure to look inside the upper GI tract.  During the procedure, an IV will be inserted into one of your veins. You may be given a medicine to help you relax.  A medicine will be used to numb your throat.  The endoscope will be passed through your mouth and down your esophagus. This information is not intended to replace advice given to you by your health care provider. Make sure you discuss any questions you have with your health care provider. Document Revised: 10/23/2017 Document Reviewed: 09/30/2017 Elsevier Patient Education  2020 [employer] Inc.                                                                                                                                      EndoscopyCare After  Please read the instructions outlined below and refer to this sheet in the next few weeks. These discharge instructions provide you with general information on caring for yourself after you leave the hospital. Your doctor may also give you specific instructions. While your treatment has been planned according to the most current medical practices available, unavoidable  complications occasionally occur. If you have any problems or questions after discharge, please call your doctor. HOME CARE INSTRUCTIONS Activity  You may resume your regular activity but move at a slower pace for the next 24 hours.   Take frequent rest periods for the next 24 hours.   Walking will help expel (get rid of) the air and reduce the bloated feeling in  your abdomen.   No driving for 24 hours (because of the anesthesia (medicine) used during the test).   You may shower.   Do not sign any important legal documents or operate any machinery for 24 hours (because of the anesthesia used during the test).  Nutrition  Drink plenty of fluids.   You may resume your normal diet.   Begin with a light meal and progress to your normal diet.   Avoid alcoholic beverages for 24 hours or as instructed by your caregiver.  Medications You may resume your normal medications unless your caregiver tells you otherwise. What you can expect today  You may experience abdominal discomfort such as a feeling of fullness or "gas" pains.   You may experience a sore throat for 2 to 3 days. This is normal. Gargling with salt water may help this.  Follow-up Your doctor will discuss the results of your test with you. SEEK IMMEDIATE MEDICAL CARE IF:  You have excessive nausea (feeling sick to your stomach) and/or vomiting.   You have severe abdominal pain and distention (swelling).   You have trouble swallowing.   You have a temperature over 100 F (37.8 C).   You have rectal bleeding or vomiting of blood.  Document Released: 12/13/2003 Document Revised: 04/19/2011 Document Reviewed: 06/25/2007  Esophageal Dilatation Esophageal dilatation, also called esophageal dilation, is a procedure to widen or open (dilate) a blocked or narrowed part of the esophagus. The esophagus is the part of the body that moves food and liquid from the mouth to the stomach. You may need this procedure if:  You have  a buildup of scar tissue in your esophagus that makes it difficult, painful, or impossible to swallow. This can be caused by gastroesophageal reflux disease (GERD).  You have cancer of the esophagus.  There is a problem with how food moves through your esophagus. In some cases, you may need this procedure repeated at a later time to dilate the esophagus gradually. Tell a health care provider about:  Any allergies you have.  All medicines you are taking, including vitamins, herbs, eye drops, creams, and over-the-counter medicines.  Any problems you or family members have had with anesthetic medicines.  Any blood disorders you have.  Any surgeries you have had.  Any medical conditions you have.  Any antibiotic medicines you are required to take before dental procedures.  Whether you are pregnant or may be pregnant. What are the risks? Generally, this is a safe procedure. However, problems may occur, including:  Bleeding due to a tear in the lining of the esophagus.  A hole (perforation) in the esophagus. What happens before the procedure?  Follow instructions from your health care provider about eating or drinking restrictions.  Ask your health care provider about changing or stopping your regular medicines. This is especially important if you are taking diabetes medicines or blood thinners.  Plan to have someone take you home from the hospital or clinic.  Plan to have a responsible adult care for you for at least 24 hours after you leave the hospital or clinic. This is important. What happens during the procedure?  You may be given a medicine to help you relax (sedative).  A numbing medicine may be sprayed into the back of your throat, or you may gargle the medicine.  Your health care provider may perform the dilatation using various surgical instruments, such as: ? Simple dilators. This instrument is carefully placed in the esophagus to stretch it. ? Guided  wire bougies.  This involves using an endoscope to insert a wire into the esophagus. A dilator is passed over this wire to enlarge the esophagus. Then the wire is removed. ? Balloon dilators. An endoscope with a small balloon at the end is inserted into the esophagus. The balloon is inflated to stretch the esophagus and open it up. The procedure may vary among health care providers and hospitals. What happens after the procedure?  Your blood pressure, heart rate, breathing rate, and blood oxygen level will be monitored until the medicines you were given have worn off.  Your throat may feel slightly sore and numb. This will improve slowly over time.  You will not be allowed to eat or drink until your throat is no longer numb.  When you are able to drink, urinate, and sit on the edge of the bed without nausea or dizziness, you may be able to return home. Follow these instructions at home:  Take over-the-counter and prescription medicines only as told by your health care provider.  Do not drive for 24 hours if you were given a sedative during your procedure.  You should have a responsible adult with you for 24 hours after the procedure.  Follow instructions from your health care provider about any eating or drinking restrictions.  Do not use any products that contain nicotine or tobacco, such as cigarettes and e-cigarettes. If you need help quitting, ask your health care provider.  Keep all follow-up visits as told by your health care provider. This is important. Get help right away if you:  Have a fever.  Have chest pain.  Have pain that is not relieved by medication.  Have trouble breathing.  Have trouble swallowing.  Vomit blood. Summary  Esophageal dilatation, also called esophageal dilation, is a procedure to widen or open (dilate) a blocked or narrowed part of the esophagus.  Plan to have someone take you home from the hospital or clinic.  For this procedure, a numbing medicine may be  sprayed into the back of your throat, or you may gargle the medicine.  Do not drive for 24 hours if you were given a sedative during your procedure. This information is not intended to replace advice given to you by your health care provider. Make sure you discuss any questions you have with your health care provider. Document Revised: 02/25/2019 Document Reviewed: 03/05/2017 Elsevier Patient Education  2020 [employer].

## 2019-11-17 ENCOUNTER — Telehealth: Payer: Self-pay | Admitting: Internal Medicine

## 2019-11-17 ENCOUNTER — Encounter (HOSPITAL_COMMUNITY): Payer: Self-pay

## 2019-11-17 ENCOUNTER — Other Ambulatory Visit (HOSPITAL_COMMUNITY)
Admission: RE | Admit: 2019-11-17 | Discharge: 2019-11-17 | Disposition: A | Discharge status: Home / Self Care | Payer: Medicaid Other | Source: Ambulatory Visit | Attending: Internal Medicine | Admitting: Internal Medicine

## 2019-11-17 ENCOUNTER — Encounter (HOSPITAL_COMMUNITY)
Admission: RE | Admit: 2019-11-17 | Discharge: 2019-11-17 | Disposition: A | Discharge status: Home / Self Care | Payer: Medicaid Other | Source: Ambulatory Visit | Attending: Internal Medicine | Admitting: Internal Medicine

## 2019-11-17 NOTE — Telephone Encounter (Signed)
Pt called to say she will need to cancel her procedure with RMR for 7/8 because she is taking care of her mother and her mother is in poor health. [phone]

## 2019-11-17 NOTE — Telephone Encounter (Signed)
Noted  

## 2019-11-17 NOTE — Telephone Encounter (Signed)
FYI to EG 

## 2019-11-17 NOTE — Telephone Encounter (Signed)
LMOVM advising to call back when ready to r/s. Called endo and made aware to cancel

## 2019-11-19 ENCOUNTER — Ambulatory Visit (HOSPITAL_COMMUNITY): Admission: RE | Admit: 2019-11-19 | Payer: Medicaid Other | Source: Home / Self Care | Admitting: Internal Medicine

## 2019-11-19 ENCOUNTER — Encounter (HOSPITAL_COMMUNITY): Admission: RE | Payer: Self-pay | Source: Home / Self Care

## 2019-11-19 SURGERY — ESOPHAGOGASTRODUODENOSCOPY (EGD) WITH PROPOFOL
Anesthesia: Monitor Anesthesia Care

## 2019-11-23 ENCOUNTER — Other Ambulatory Visit: Payer: Self-pay | Admitting: Nurse Practitioner

## 2019-11-23 DIAGNOSIS — K219 Gastro-esophageal reflux disease without esophagitis: Secondary | ICD-10-CM

## 2019-11-23 DIAGNOSIS — Z79891 Long term (current) use of opiate analgesic: Secondary | ICD-10-CM | POA: Diagnosis not present

## 2019-11-23 DIAGNOSIS — R1319 Other dysphagia: Secondary | ICD-10-CM

## 2019-11-23 DIAGNOSIS — M25552 Pain in left hip: Secondary | ICD-10-CM | POA: Diagnosis not present

## 2019-11-23 DIAGNOSIS — M545 Low back pain: Secondary | ICD-10-CM | POA: Diagnosis not present

## 2019-11-23 DIAGNOSIS — M069 Rheumatoid arthritis, unspecified: Secondary | ICD-10-CM | POA: Diagnosis not present

## 2019-11-23 DIAGNOSIS — G603 Idiopathic progressive neuropathy: Secondary | ICD-10-CM | POA: Diagnosis not present

## 2019-11-25 ENCOUNTER — Telehealth: Payer: Self-pay | Admitting: Family Medicine

## 2019-11-25 DIAGNOSIS — E782 Mixed hyperlipidemia: Secondary | ICD-10-CM

## 2019-11-25 MED ORDER — PRAVASTATIN SODIUM 20 MG PO TABS: 20.0000 mg | ORAL_TABLET | Freq: Every day | ORAL | 3 refills | Status: DC

## 2019-11-25 NOTE — Telephone Encounter (Signed)
Pt called stating that she brought her mother in for an appt yesterday and while here, she briefly spoke to [place] regarding her Lipitor Rx. Says Britney told her to call office and leave her a message about it. Pt wants Britney to change her Lipitor Rx to something else to help her cholesterol. [facility] [place].

## 2019-11-25 NOTE — Telephone Encounter (Signed)
Lmtcb.

## 2019-11-25 NOTE — Telephone Encounter (Signed)
lmtcb

## 2019-11-25 NOTE — Telephone Encounter (Signed)
Lipitor D/C and added to allergy list. Rx'd Pravastatin as it is often tolerated better.

## 2019-11-26 ENCOUNTER — Telehealth: Payer: Self-pay | Admitting: Family Medicine

## 2019-11-26 DIAGNOSIS — E782 Mixed hyperlipidemia: Secondary | ICD-10-CM

## 2019-11-26 MED ORDER — LIVALO 2 MG PO TABS: 2.0000 mg | ORAL_TABLET | Freq: Every evening | ORAL | 0 refills | Status: DC

## 2019-11-26 NOTE — Telephone Encounter (Signed)
There is a one more than I am willing to try.  If she does not tolerate it we will not try any other statins.

## 2019-11-26 NOTE — Telephone Encounter (Signed)
Pt aware Pitvastatin was sent in for her to try.

## 2019-11-30 DIAGNOSIS — Z79899 Other long term (current) drug therapy: Secondary | ICD-10-CM | POA: Diagnosis not present

## 2019-11-30 DIAGNOSIS — M810 Age-related osteoporosis without current pathological fracture: Secondary | ICD-10-CM | POA: Diagnosis not present

## 2019-11-30 DIAGNOSIS — Z780 Asymptomatic menopausal state: Secondary | ICD-10-CM | POA: Diagnosis not present

## 2019-11-30 DIAGNOSIS — M0579 Rheumatoid arthritis with rheumatoid factor of multiple sites without organ or systems involvement: Secondary | ICD-10-CM | POA: Diagnosis not present

## 2019-11-30 DIAGNOSIS — M545 Low back pain: Secondary | ICD-10-CM | POA: Diagnosis not present

## 2019-12-22 ENCOUNTER — Telehealth: Payer: Self-pay | Admitting: Nurse Practitioner

## 2019-12-22 ENCOUNTER — Other Ambulatory Visit: Payer: Self-pay | Admitting: Neurology

## 2019-12-22 DIAGNOSIS — G8929 Other chronic pain: Secondary | ICD-10-CM

## 2019-12-22 NOTE — Telephone Encounter (Signed)
Phone call to patient to verify medication list and allergies for myelogram procedure. Pt instructed to hold zoloft for 48hrs prior to myelogram appointment time. Pt verbalized understanding. Pre and post procedure instructions reviewed with pt. 

## 2019-12-23 ENCOUNTER — Encounter: Payer: Self-pay | Admitting: Nurse Practitioner

## 2019-12-23 ENCOUNTER — Ambulatory Visit: Payer: Medicaid Other | Admitting: Nurse Practitioner

## 2019-12-23 ENCOUNTER — Other Ambulatory Visit: Payer: Self-pay

## 2019-12-23 VITALS — BP 141/87 | HR 91 | Temp 96.2°F | Ht 67.0 in | Wt 185.4 lb

## 2019-12-23 DIAGNOSIS — K219 Gastro-esophageal reflux disease without esophagitis: Secondary | ICD-10-CM | POA: Diagnosis not present

## 2019-12-23 DIAGNOSIS — R1310 Dysphagia, unspecified: Secondary | ICD-10-CM

## 2019-12-23 DIAGNOSIS — K760 Fatty (change of) liver, not elsewhere classified: Secondary | ICD-10-CM | POA: Diagnosis not present

## 2019-12-23 DIAGNOSIS — R1319 Other dysphagia: Secondary | ICD-10-CM

## 2019-12-23 MED ORDER — SUCRALFATE 1 GM/10ML PO SUSP: 1.0000 g | Freq: Four times a day (QID) | ORAL | 2 refills | Status: DC

## 2019-12-23 NOTE — Progress Notes (Signed)
Referring Provider: Remus Loffler, PA-C Primary Care Physician:  Gwenlyn Fudge, FNP Primary GI:  Dr. Marletta Lor  Chief Complaint  Patient presents with  . Gastroesophageal Reflux    had to cancel EGD d/t sickness  . Dysphagia    some better    HPI:   Kaitlin Fleming is a 59 y.o. female who presents for follow-up on GERD and dysphagia.  The patient was last seen in our office 06/25/2019 for the same as well as NAFLD.  Noted history of GERD including abdominal pain, dysphagia.  Abdominal ultrasound 06/04/2019 with fatty liver disease without cholelithiasis or cholecystitis, diffusely increased echogenicity of the liver parenchyma with no mention of nodular border.  At her last visit on regular PPIs (lansoprazole 30 mg daily) with recurrent GERD symptoms at least twice a week and PPI has not helped.  Some morning time nausea and vomiting, solid food dysphagia typically worse after meats.  Notes recent dental work.  Chronic RA pain.  Diet is "heart healthy in part unhealthy" but has not seen the nutritionist.  Her major trigger is [employer] and typically drinks 3 a day, only 1 glass of water a day.  Recommended change PPI to Protonix 40 mg twice daily, schedule EGD with possible dilation, gave education and printed information about NAFLD.  One-time screening for hepatitis C based on CDC recommendations.  Follow-up in 6 months.  Hepatitis C antibody completed 07/06/2019 and was negative.  Results communicated to the patient.  EGD initially scheduled for 11/19/2019 but had to cancel due to a sinus infection.  Today she states she doing okay overall. She's doing a bit better overall. She started Nexium and has been doing ok, but her arthritis (RA) medication does aggravate her GERD. Mostly tolerable, but is asking for flare medication. Has a lot of mouth dryness and she feels like things are drying out a lot. Denies abdominal pain, N/V, hematochezia, melena, fever, chills, unintentional weight loss.  Denies URI or flu-like symptoms. Denies loss of sense of taste or smell. The patient has received COVID-19 vaccination(s). They were recommended to hold off on COVID-19 vaccine while her RA is "flared up" by rheumatologist. She does wear a maks all the time unless at home. Denies chest pain, dyspnea, dizziness, lightheadedness, syncope, near syncope. Denies any other upper or lower GI symptoms.  She needs to hold off on EGD for now, may need back surgery soon (possibly broken screw)  Past Medical History:  Diagnosis Date  . Chronic back pain    stenosis  . Chronic pain    takes Lexapro daily  . Complication of anesthesia    pt states after surgery in 2000 had to wear a heart monitor for 3 days.  . DDD (degenerative disc disease)   . Depression   . GERD (gastroesophageal reflux disease)    takes OTC meds if needed  . History of blood transfusion    as a child  . History of bronchitis 52yrs ago  . Hyperlipidemia    takes Fenofibrate daily  . Joint pain   . Joint swelling   . Kidney stone    has one right now on the right side but not giving any problems  . Osteoarthritis   . Peripheral edema    takes HCTZ daily   . Pneumonia 61yrs ago   hx of  . PONV (postoperative nausea and vomiting)   . Rheumatoid arthritis (HCC)   . Rheumatoid arthritis (HCC)   . Weakness  and tingling on left side    Past Surgical History:  Procedure Laterality Date  . ABDOMINAL HYSTERECTOMY  2000  . BACK SURGERY  03/10/14   lumbar fusion  . LUMBAR LAMINECTOMY/DECOMPRESSION MICRODISCECTOMY Left 12/08/2013   Procedure: LEFT LUMBAR FOUR-FIVEW, LUMBAR FIVE-SACRAL ONE LAMINECTOMY;  Surgeon: Karn Cassis, MD;  Location: [facility];  Service: Neurosurgery;  Laterality: Left;  . TUBAL LIGATION  24 yrs ago    Current Outpatient Medications  Medication Sig Dispense Refill  . cyclobenzaprine (FLEXERIL) 10 MG tablet Take 1 tablet (10 mg total) by mouth 3 (three) times daily as needed for muscle spasms.  45 tablet 11  . esomeprazole (NEXIUM) 40 MG capsule TAKE 1 CAPSULE TWICE DAILY BEFORE A MEAL 60 capsule 3  . hydrochlorothiazide (HYDRODIURIL) 50 MG tablet Take 0.5 tablets (25 mg total) by mouth daily. 90 tablet 1  . loratadine (CLARITIN) 10 MG tablet Take 1 tablet (10 mg total) by mouth at bedtime. 30 tablet 1  . oxyCODONE-acetaminophen (PERCOCET) 10-325 MG tablet Take 1 tablet by mouth every 6 (six) hours as needed for pain.     . predniSONE (DELTASONE) 5 MG tablet Take 5 mg by mouth daily.    . sertraline (ZOLOFT) 50 MG tablet Take 1 tablet (50 mg total) by mouth daily. 90 tablet 3  . Upadacitinib ER (RINVOQ) 15 MG TB24 Take 1 tablet by mouth daily.    . valACYclovir (VALTREX) 1000 MG tablet Take 1,000 mg by mouth daily as needed (fever blisters).      No current facility-administered medications for this visit.    Allergies as of 12/23/2019 - Review Complete 12/23/2019  Allergen Reaction Noted  . Macrobid [nitrofurantoin] Shortness Of Breath and Rash 12/22/2019  . Lotensin [benazepril hcl]  09/01/2016  . Plaquenil [hydroxychloroquine sulfate] Nausea And Vomiting and Other (See Comments) 03/21/2014  . Macrolides and ketolides  05/12/2018  . Methotrexate  12/04/2017  . Remicade [infliximab]  05/12/2018  . Tofacitinib Other (See Comments) 11/01/2016  . Atorvastatin Other (See Comments) 11/25/2019  . Doxycycline Other (See Comments) 12/02/2013  . Ibuprofen Hives 03/21/2014  . Penicillins Other (See Comments) 12/02/2013  . Pravastatin Other (See Comments) 11/26/2019  . Sulfa antibiotics Other (See Comments) 12/02/2013    Family History  Problem Relation Age of Onset  . Colon cancer Neg Hx     Social History   Socioeconomic History  . Marital status: Married    Spouse name: Not on file  . Number of children: Not on file  . Years of education: Not on file  . Highest education level: Not on file  Occupational History  . Not on file  Tobacco Use  . Smoking status: Never  Smoker  . Smokeless tobacco: Never Used  Vaping Use  . Vaping Use: Never used  Substance and Sexual Activity  . Alcohol use: No  . Drug use: No  . Sexual activity: Yes    Birth control/protection: Surgical  Other Topics Concern  . Not on file  Social History Narrative  . Not on file   Social Determinants of Health   Financial Resource Strain:   . Difficulty of Paying Living Expenses:   Food Insecurity:   . Worried About Programme researcher, broadcasting/film/video in the Last Year:   . Barista in the Last Year:   Transportation Needs:   . Freight forwarder (Medical):   Marland Kitchen Lack of Transportation (Non-Medical):   Physical Activity:   . Days of Exercise per Week:   .  Minutes of Exercise per Session:   Stress:   . Feeling of Stress :   Social Connections:   . Frequency of Communication with Friends and Family:   . Frequency of Social Gatherings with Friends and Family:   . Attends Religious Services:   . Active Member of Clubs or Organizations:   . Attends Banker Meetings:   Marland Kitchen Marital Status:     Subjective: Review of Systems  Constitutional: Negative for chills, fever, malaise/fatigue and weight loss.  HENT: Negative for congestion and sore throat.   Respiratory: Negative for cough and shortness of breath.   Cardiovascular: Negative for chest pain and palpitations.  Gastrointestinal: Negative for abdominal pain, blood in stool, diarrhea, melena, nausea and vomiting.  Musculoskeletal: Negative for joint pain and myalgias.  Skin: Negative for rash.  Neurological: Negative for dizziness and weakness.  Endo/Heme/Allergies: Does not bruise/bleed easily.  Psychiatric/Behavioral: Negative for depression. The patient is not nervous/anxious.   All other systems reviewed and are negative.    Objective: BP (!) 141/87   Pulse 91   Temp (!) 96.2 F (35.7 C) (Temporal)   Ht 5\' 7"  (1.702 m)   Wt 185 lb 6.4 oz (84.1 kg)   BMI 29.04 kg/m  Physical Exam Vitals and nursing  note reviewed.  Constitutional:      General: She is not in acute distress.    Appearance: Normal appearance. She is well-developed. She is not ill-appearing, toxic-appearing or diaphoretic.  HENT:     Head: Normocephalic and atraumatic.     Nose: No congestion or rhinorrhea.  Eyes:     General: No scleral icterus. Cardiovascular:     Rate and Rhythm: Normal rate and regular rhythm.     Heart sounds: Normal heart sounds.  Pulmonary:     Effort: Pulmonary effort is normal. No respiratory distress.     Breath sounds: Normal breath sounds.  Abdominal:     General: Bowel sounds are normal.     Palpations: Abdomen is soft. There is no hepatomegaly, splenomegaly or mass.     Tenderness: There is no abdominal tenderness. There is no guarding or rebound.     Hernia: No hernia is present.  Skin:    General: Skin is warm and dry.     Coloration: Skin is not jaundiced.     Findings: No rash.  Neurological:     General: No focal deficit present.     Mental Status: She is alert and oriented to person, place, and time.  Psychiatric:        Attention and Perception: Attention normal.        Mood and Affect: Mood normal.        Speech: Speech normal.        Behavior: Behavior normal.        Thought Content: Thought content normal.        Cognition and Memory: Cognition and memory normal.      Assessment: Pleasant 59 year old female with known fatty liver disease and family history of such.  She is making an attempt to exercise and lose weight, which is difficult with her existing rheumatoid arthritis.  She has lost about 10 pounds intentionally.  Recommend she continue diet and exercise related to fatty liver disease.  Patient also has GERD with breakthrough symptoms about 2 or 3 times a week, overall improved on Nexium as prescribed at her last visit.  Her GERD is aggravated by her rheumatoid medications.  She is asking for  something for breakthrough Salena to happen.  Overall she feels her  symptoms are better. Dysphagia is improved but persistent, likely related to GERD. Has to cancel recent EGD due to sinus infection. Has upcomming possible back surgery and would liek to hold off for now.  Plan: 1. Continue GERD medications 2. I will send in Carafate qid prn for breakthrough 3. Call when ready to schedule EGD +/- dilation 4. Follow-up in 3 months 5. Continued diet and exercise for NAFLD 6. Call for worsening symptoms    12/23/2019 11:47 AM   Disclaimer: This note was dictated with voice recognition software. Similar sounding words can inadvertently be transcribed and may not be corrected upon review.

## 2019-12-23 NOTE — Patient Instructions (Addendum)
Your health issues we discussed today were:   GERD (heartburn/reflux) with dysphagia (swallowing difficulties): 1. Your swallowing difficulties are likely due to your GERD symptoms 2. Continue your current medications as you have been 3. I sent a prescription for Carafate that she can take 4 times a day as needed for breakthrough GERD symptoms 4. Call us if you have any worsening or severe symptoms 5. Call us when you are ready to schedule your upper endoscopy with possible dilation to help treat your swallowing difficulties  Fatty liver disease: 1. Continue your great work with diet and exercise to help reduce your fatty liver disease 2. Call us if you have any worsening or worrisome symptoms  Overall I recommend:  1. Continue your other current medications 2. Return for follow-up in 3 months 3. Call us for any questions or concerns   At [facility] we value your feedback. You may receive a survey about your visit today. Please share your experience as we strive to create trusting relationships with our patients to provide genuine, compassionate, quality care.  We appreciate your understanding and patience as we review any laboratory studies, imaging, and other diagnostic tests that are ordered as we care for you. Our office policy is 5 business days for review of these results, and any emergent or urgent results are addressed in a timely manner for your best interest. If you do not hear from our office in 1 week, please contact us.   We also encourage the use of MyChart, which contains your medical information for your review as well. If you are not enrolled in this feature, an access code is on this after visit summary for your convenience. Thank you for allowing [place] to be involved in your care.  It was great to see you today!  I hope you have a great rest of your summer!!

## 2019-12-25 ENCOUNTER — Other Ambulatory Visit: Payer: Medicaid Other

## 2019-12-25 ENCOUNTER — Inpatient Hospital Stay: Admission: RE | Admit: 2019-12-25 | Payer: Medicaid Other | Source: Ambulatory Visit

## 2019-12-31 ENCOUNTER — Other Ambulatory Visit: Payer: Medicaid Other

## 2020-01-01 ENCOUNTER — Other Ambulatory Visit: Payer: Medicaid Other

## 2020-01-07 ENCOUNTER — Ambulatory Visit
Admission: RE | Admit: 2020-01-07 | Discharge: 2020-01-07 | Disposition: A | Discharge status: Home / Self Care | Payer: Medicaid Other | Source: Ambulatory Visit | Attending: Neurology | Admitting: Neurology

## 2020-01-07 DIAGNOSIS — M5126 Other intervertebral disc displacement, lumbar region: Secondary | ICD-10-CM | POA: Diagnosis not present

## 2020-01-07 DIAGNOSIS — G8929 Other chronic pain: Secondary | ICD-10-CM

## 2020-01-07 DIAGNOSIS — M4856XA Collapsed vertebra, not elsewhere classified, lumbar region, initial encounter for fracture: Secondary | ICD-10-CM | POA: Diagnosis not present

## 2020-01-07 DIAGNOSIS — M48061 Spinal stenosis, lumbar region without neurogenic claudication: Secondary | ICD-10-CM | POA: Diagnosis not present

## 2020-01-07 DIAGNOSIS — M545 Low back pain, unspecified: Secondary | ICD-10-CM

## 2020-01-07 MED ORDER — IOPAMIDOL (ISOVUE-M 200) INJECTION 41%
18.0000 mL | Freq: Once | INTRAMUSCULAR | Status: AC
  Administered 2020-01-07: 18 mL via INTRATHECAL

## 2020-01-07 MED ORDER — DIAZEPAM 5 MG PO TABS
5.0000 mg | ORAL_TABLET | Freq: Once | ORAL | Status: AC
  Administered 2020-01-07: 5 mg via ORAL

## 2020-01-07 NOTE — Discharge Instructions (Signed)
Myelogram Discharge Instructions  1. Go home and rest quietly for the next 24 hours.  It is important to lie flat for the next 24 hours.  Get up only to go to the restroom.  You may lie in the bed or on a couch on your back, your stomach, your left side or your right side.  You may have one pillow under your head.  You may have pillows between your knees while you are on your side or under your knees while you are on your back.  2. DO NOT drive today.  Recline the seat as far back as it will go, while still wearing your seat belt, on the way home.  3. You may get up to go to the bathroom as needed.  You may sit up for 10 minutes to eat.  You may resume your normal diet and medications unless otherwise indicated.  Drink lots of extra fluids today and tomorrow.  4. The incidence of headache, nausea, or vomiting is about 5% (one in 20 patients).  If you develop a headache, lie flat and drink plenty of fluids until the headache goes away.  Caffeinated beverages may be helpful.  If you develop severe nausea and vomiting or a headache that does not go away with flat bed rest, call [phone].  5. You may resume normal activities after your 24 hours of bed rest is over; however, do not exert yourself strongly or do any heavy lifting tomorrow. If when you get up you have a headache when standing, go back to bed and force fluids for another 24 hours.  6. Call your physician for a follow-up appointment.  The results of your myelogram will be sent directly to your physician by the following day.  7. If you have any questions or if complications develop after you arrive home, please call [phone].  Discharge instructions have been explained to the patient.  The patient, or the person responsible for the patient, fully understands these instructions.  YOU MAY RESTART YOUR ZOLOFT TOMORROW 01/08/2020 AT 10:30AM.

## 2020-01-19 ENCOUNTER — Telehealth: Payer: Self-pay | Admitting: Family Medicine

## 2020-01-19 NOTE — Telephone Encounter (Signed)
Pt wants BJoyce opinion on CT scan done in August even though Alona Bene did not order it. Please call back

## 2020-01-20 NOTE — Telephone Encounter (Signed)
Patient aware and verbalizes understanding. 

## 2020-01-20 NOTE — Telephone Encounter (Signed)
That she should be seeing a specialist due to the pain and imaging findings.

## 2020-01-28 DIAGNOSIS — S86812A Strain of other muscle(s) and tendon(s) at lower leg level, left leg, initial encounter: Secondary | ICD-10-CM | POA: Diagnosis not present

## 2020-02-04 ENCOUNTER — Emergency Department (HOSPITAL_COMMUNITY): Payer: Medicaid Other

## 2020-02-04 ENCOUNTER — Emergency Department (HOSPITAL_COMMUNITY)
Admission: EM | Admit: 2020-02-04 | Discharge: 2020-02-04 | Disposition: A | Discharge status: Home / Self Care | Payer: Medicaid Other | Attending: Emergency Medicine | Admitting: Emergency Medicine

## 2020-02-04 ENCOUNTER — Other Ambulatory Visit: Payer: Self-pay

## 2020-02-04 ENCOUNTER — Encounter (HOSPITAL_COMMUNITY): Payer: Self-pay | Admitting: *Deleted

## 2020-02-04 DIAGNOSIS — M79605 Pain in left leg: Secondary | ICD-10-CM | POA: Diagnosis not present

## 2020-02-04 DIAGNOSIS — S86912A Strain of unspecified muscle(s) and tendon(s) at lower leg level, left leg, initial encounter: Secondary | ICD-10-CM | POA: Insufficient documentation

## 2020-02-04 DIAGNOSIS — X58XXXA Exposure to other specified factors, initial encounter: Secondary | ICD-10-CM | POA: Diagnosis not present

## 2020-02-04 DIAGNOSIS — T148XXA Other injury of unspecified body region, initial encounter: Secondary | ICD-10-CM

## 2020-02-04 DIAGNOSIS — S8992XA Unspecified injury of left lower leg, initial encounter: Secondary | ICD-10-CM | POA: Diagnosis present

## 2020-02-04 DIAGNOSIS — S39012A Strain of muscle, fascia and tendon of lower back, initial encounter: Secondary | ICD-10-CM | POA: Diagnosis not present

## 2020-02-04 NOTE — ED Triage Notes (Signed)
Left leg pain x 1 week, states pain is radiating from calf to knee

## 2020-02-04 NOTE — Discharge Instructions (Signed)
Your ultrasound is negative for any evidence of a blood clot.  Your exam suggests you have a muscle strain in your lower leg.  Apply a heating pad for 15 minutes several times daily and work on flexing and extending your ankle to help exercise and stretch this muscle as it heals. Gentle massage may also be helpful.

## 2020-02-05 ENCOUNTER — Telehealth: Payer: Self-pay

## 2020-02-05 NOTE — Telephone Encounter (Signed)
Transition Care Management Follow-up Telephone Call  Date of discharge and from where: Kaitlin Fleming 02/04/2020  How have you been since you were released from the hospital? The same, some leg pain but she is feeling ok  Any questions or concerns? No  Items Reviewed:  Did the pt receive and understand the discharge instructions provided? Yes   Medications obtained and verified? Yes   Any new allergies since your discharge? No   Dietary orders reviewed? Yes  Do you have support at home? Yes   Functional Questionnaire: (I = Independent and D = Dependent) ADLs: I  Bathing/Dressing- I  Meal Prep- I  Eating- I  Maintaining continence- I  Transferring/Ambulation- I  Managing Meds- I  Follow up appointments reviewed:   PCP Hospital f/u appt confirmed? No.  Specialist Hospital f/u appt confirmed? Yes  Scheduled to see Dr. Helen Hashimoto, MD on 03/29/2020 @ 11:00  Are transportation arrangements needed? No  If their condition worsens, is the pt aware to call PCP or go to the Emergency Dept.? Yes  Was the patient provided with contact information for the PCP's office or ED? Yes  Was to pt encouraged to call back with questions or concerns? Yes  TA/CMA

## 2020-02-07 NOTE — ED Provider Notes (Signed)
[facility] EMERGENCY DEPARTMENT Provider Note   CSN: [id number] Arrival date & time: 02/04/20  1434     History Chief Complaint  Patient presents with  . Leg Pain    Kaitlin Fleming is a 59 y.o. female with a history of lumbar stenosis with ddd, had a recent flare of her lower back pain which has improved, but has now developed pain in her left lateral calf which radiates to her lower knee region.  Denies injury or fall but endorses change of gait due to back pain.  No swelling or numbness in the leg, is concerned about possible dvt. No hx of dvt.  Denies sob, cp, also no fevers, chills, rash.  She has used elevation and rest without improvement.   The history is provided by the patient.       Past Medical History:  Diagnosis Date  . Chronic back pain    stenosis  . Chronic pain    takes Lexapro daily  . Complication of anesthesia    pt states after surgery in 2000 had to wear a heart monitor for 3 days.  . DDD (degenerative disc disease)   . Depression   . GERD (gastroesophageal reflux disease)    takes OTC meds if needed  . History of blood transfusion    as a child  . History of bronchitis 13yrs ago  . Hyperlipidemia    takes Fenofibrate daily  . Joint pain   . Joint swelling   . Kidney stone    has one right now on the right side but not giving any problems  . Osteoarthritis   . Peripheral edema    takes HCTZ daily   . Pneumonia 73yrs ago   hx of  . PONV (postoperative nausea and vomiting)   . Rheumatoid arthritis (HCC)   . Rheumatoid arthritis (HCC)   . Weakness    and tingling on left side    Patient Active Problem List   Diagnosis Date Noted  . GERD (gastroesophageal reflux disease) 06/25/2019  . Dysphagia 06/25/2019  . Fatty liver disease, nonalcoholic 06/25/2019  . Preventative health care 06/25/2019  . Rheumatoid arthritis (HCC) 08/02/2017  . Influenza due to influenza virus, type B 08/02/2017  . Influenza B 08/02/2017  . Lumbar radiculopathy,  chronic 06/22/2014  . Lumbar degenerative disc disease 03/10/2014  . Lumbar stenosis without neurogenic claudication 12/08/2013    Past Surgical History:  Procedure Laterality Date  . ABDOMINAL HYSTERECTOMY  2000  . BACK SURGERY  03/10/14   lumbar fusion  . LUMBAR LAMINECTOMY/DECOMPRESSION MICRODISCECTOMY Left 12/08/2013   Procedure: LEFT LUMBAR FOUR-FIVEW, LUMBAR FIVE-SACRAL ONE LAMINECTOMY;  Surgeon: Karn Cassis, MD;  Location: [facility];  Service: Neurosurgery;  Laterality: Left;  . TUBAL LIGATION  24 yrs ago     OB History   No obstetric history on file.     Family History  Problem Relation Age of Onset  . Colon cancer Neg Hx     Social History   Tobacco Use  . Smoking status: Never Smoker  . Smokeless tobacco: Never Used  Vaping Use  . Vaping Use: Never used  Substance Use Topics  . Alcohol use: No  . Drug use: No    Home Medications Prior to Admission medications   Medication Sig Start Date End Date Taking? Authorizing Provider  cyclobenzaprine (FLEXERIL) 10 MG tablet Take 1 tablet (10 mg total) by mouth 3 (three) times daily as needed for muscle spasms. 05/29/19   Prudy Feeler  S, PA-C  esomeprazole (NEXIUM) 40 MG capsule TAKE 1 CAPSULE TWICE DAILY BEFORE A MEAL 11/27/19   Anice Paganini, NP  hydrochlorothiazide (HYDRODIURIL) 50 MG tablet Take 0.5 tablets (25 mg total) by mouth daily. 05/29/19   Remus Loffler, PA-C  loratadine (CLARITIN) 10 MG tablet Take 1 tablet (10 mg total) by mouth at bedtime. 08/04/17   Vassie Loll, MD  oxyCODONE-acetaminophen (PERCOCET) 10-325 MG tablet Take 1 tablet by mouth every 6 (six) hours as needed for pain.     [provider]  predniSONE (DELTASONE) 5 MG tablet Take 5 mg by mouth daily.    [provider]  sertraline (ZOLOFT) 50 MG tablet Take 1 tablet (50 mg total) by mouth daily. 08/19/19   Junie Spencer, FNP  sucralfate (CARAFATE) 1 GM/10ML suspension Take 10 mLs (1 g total) by mouth 4 (four) times daily.  12/23/19   Anice Paganini, NP  Upadacitinib ER (RINVOQ) 15 MG TB24 Take 1 tablet by mouth daily. 11/16/19   [provider]  valACYclovir (VALTREX) 1000 MG tablet Take 1,000 mg by mouth daily as needed (fever blisters).     [provider]    Allergies    Macrobid [nitrofurantoin], Lotensin [benazepril hcl], Plaquenil [hydroxychloroquine sulfate], Macrolides and ketolides, Methotrexate, Remicade [infliximab], Tofacitinib, Atorvastatin, Doxycycline, Ibuprofen, Penicillins, Pravastatin, and Sulfa antibiotics  Review of Systems   Review of Systems  Constitutional: Negative for fever.  Musculoskeletal: Positive for arthralgias, back pain and myalgias. Negative for joint swelling.  Neurological: Negative for weakness and numbness.    Physical Exam Updated Vital Signs BP (!) 148/76   Pulse 79   Temp 98.9 F (37.2 C)   Resp 19   SpO2 99%   Physical Exam Constitutional:      Appearance: She is well-developed.  HENT:     Head: Atraumatic.  Cardiovascular:     Pulses:          Dorsalis pedis pulses are 2+ on the right side and 2+ on the left side.     Comments: Pulses equal bilaterally Musculoskeletal:        General: Tenderness present. No swelling or deformity.     Cervical back: Normal range of motion.     Right lower leg: No edema.     Left lower leg: No edema.       Legs:     Comments: Localized tenderness left lateral upper calf, no edema, no cords or nodules, no red streaking or rash. Negative homans.   Skin:    General: Skin is warm and dry.  Neurological:     Mental Status: She is alert.     Sensory: No sensory deficit.     Deep Tendon Reflexes: Reflexes normal.     ED Results / Procedures / Treatments   Labs (all labs ordered are listed, but only abnormal results are displayed) Labs Reviewed - No data to display  EKG None  Radiology No results found.  Results for orders placed or performed in visit on 06/25/19  Hepatitis C antibody  Result  Value Ref Range   Hepatitis C Ab NON-REACTIVE NON-REACTI   SIGNAL TO CUT-OFF 0.01 <1.00   US Venous Img Lower Unilateral Left  Result Date: 02/04/2020 CLINICAL DATA:  59 year old female with a history of leg pain EXAM: LEFT LOWER EXTREMITY VENOUS DOPPLER ULTRASOUND TECHNIQUE: Gray-scale sonography with graded compression, as well as color Doppler and duplex ultrasound were performed to evaluate the lower extremity deep venous systems from the  level of the common femoral vein and including the common femoral, femoral, profunda femoral, popliteal and calf veins including the posterior tibial, peroneal and gastrocnemius veins when visible. The superficial great saphenous vein was also interrogated. Spectral Doppler was utilized to evaluate flow at rest and with distal augmentation maneuvers in the common femoral, femoral and popliteal veins. COMPARISON:  None. FINDINGS: Contralateral Common Femoral Vein: Respiratory phasicity is normal and symmetric with the symptomatic side. No evidence of thrombus. Normal compressibility. Common Femoral Vein: No evidence of thrombus. Normal compressibility, respiratory phasicity and response to augmentation. Saphenofemoral Junction: No evidence of thrombus. Normal compressibility and flow on color Doppler imaging. Profunda Femoral Vein: No evidence of thrombus. Normal compressibility and flow on color Doppler imaging. Femoral Vein: No evidence of thrombus. Normal compressibility, respiratory phasicity and response to augmentation. Popliteal Vein: No evidence of thrombus. Normal compressibility, respiratory phasicity and response to augmentation. Calf Veins: No evidence of thrombus. Normal compressibility and flow on color Doppler imaging. Superficial Great Saphenous Vein: No evidence of thrombus. Normal compressibility and flow on color Doppler imaging. Other Findings:  None. IMPRESSION: Sonographic survey of the left lower extremity negative for DVT Electronically Signed   By:  Gilmer Mor D.O.   On: 02/04/2020 16:48    Procedures Procedures (including critical care time)  Medications Ordered in ED Medications - No data to display  ED Course  I have reviewed the triage vital signs and the nursing notes.  Pertinent labs & imaging results that were available during my care of the patient were reviewed by me and considered in my medical decision making (see chart for details).    MDM Rules/Calculators/A&P                          Imaging reviewed and negative for acute dvt.  Suspect muscle strain secondary to gait change with recent low back flare.  No neuro deficit on exam or by history to suggest emergent or surgical presentation. Advised heat, massage, stretching. Prn f/u pcp for new or worsened sx.        Final Clinical Impression(s) / ED Diagnoses Final diagnoses:  Muscle strain    Rx / DC Orders ED Discharge Orders    None       Victoriano Lain 02/07/20 1235    Bethann Berkshire, MD 02/09/20 1023

## 2020-02-15 DIAGNOSIS — M069 Rheumatoid arthritis, unspecified: Secondary | ICD-10-CM | POA: Diagnosis not present

## 2020-02-15 DIAGNOSIS — M25552 Pain in left hip: Secondary | ICD-10-CM | POA: Diagnosis not present

## 2020-02-15 DIAGNOSIS — G603 Idiopathic progressive neuropathy: Secondary | ICD-10-CM | POA: Diagnosis not present

## 2020-02-15 DIAGNOSIS — M545 Low back pain, unspecified: Secondary | ICD-10-CM | POA: Diagnosis not present

## 2020-02-15 DIAGNOSIS — Z79891 Long term (current) use of opiate analgesic: Secondary | ICD-10-CM | POA: Diagnosis not present

## 2020-02-24 ENCOUNTER — Other Ambulatory Visit: Payer: Self-pay

## 2020-02-24 ENCOUNTER — Other Ambulatory Visit: Payer: Medicaid Other

## 2020-02-24 DIAGNOSIS — E782 Mixed hyperlipidemia: Secondary | ICD-10-CM | POA: Diagnosis not present

## 2020-02-24 LAB — CBC WITH DIFFERENTIAL/PLATELET

## 2020-02-24 LAB — LIPID PANEL

## 2020-02-25 LAB — CMP14+EGFR
ALT: 14 IU/L (ref 0–32)
AST: 13 IU/L (ref 0–40)
Albumin/Globulin Ratio: 1.5 (ref 1.2–2.2)
Albumin: 4.2 g/dL (ref 3.8–4.9)
Alkaline Phosphatase: 112 IU/L (ref 44–121)
BUN/Creatinine Ratio: 10 (ref 9–23)
BUN: 7 mg/dL (ref 6–24)
Bilirubin Total: 0.2 mg/dL (ref 0.0–1.2)
CO2: 29 mmol/L (ref 20–29)
Calcium: 9.4 mg/dL (ref 8.7–10.2)
Chloride: 98 mmol/L (ref 96–106)
Creatinine, Ser: 0.69 mg/dL (ref 0.57–1.00)
GFR calc Af Amer: 110 mL/min/{1.73_m2} (ref >59)
GFR calc non Af Amer: 96 mL/min/{1.73_m2} (ref >59)
Globulin, Total: 2.8 g/dL (ref 1.5–4.5)
Glucose: 85 mg/dL (ref 65–99)
Potassium: 3.5 mmol/L (ref 3.5–5.2)
Sodium: 143 mmol/L (ref 134–144)
Total Protein: 7 g/dL (ref 6.0–8.5)

## 2020-02-25 LAB — CBC WITH DIFFERENTIAL/PLATELET
Basophils Absolute: 0 10*3/uL (ref 0.0–0.2)
Basos: 0 %
EOS (ABSOLUTE): 0.2 10*3/uL (ref 0.0–0.4)
Eos: 2 %
Hematocrit: 40.3 % (ref 34.0–46.6)
Hemoglobin: 13.4 g/dL (ref 11.1–15.9)
Immature Grans (Abs): 0 10*3/uL (ref 0.0–0.1)
Immature Granulocytes: 0 %
Lymphocytes Absolute: 1.6 10*3/uL (ref 0.7–3.1)
Lymphs: 22 %
MCH: 27.6 pg (ref 26.6–33.0)
MCHC: 33.3 g/dL (ref 31.5–35.7)
MCV: 83 fL (ref 79–97)
Monocytes Absolute: 0.7 10*3/uL (ref 0.1–0.9)
Monocytes: 10 %
Neutrophils Absolute: 4.6 10*3/uL (ref 1.4–7.0)
Neutrophils: 66 %
Platelets: 226 10*3/uL (ref 150–450)
RBC: 4.86 x10E6/uL (ref 3.77–5.28)
RDW: 14.1 % (ref 11.7–15.4)
WBC: 7 10*3/uL (ref 3.4–10.8)

## 2020-02-25 LAB — LIPID PANEL
Chol/HDL Ratio: 5 ratio — ABNORMAL HIGH (ref 0.0–4.4)
Cholesterol, Total: 265 mg/dL — ABNORMAL HIGH (ref 100–199)
HDL: 53 mg/dL (ref >39)
LDL Chol Calc (NIH): 166 mg/dL — ABNORMAL HIGH (ref 0–99)
Triglycerides: 246 mg/dL — ABNORMAL HIGH (ref 0–149)
VLDL Cholesterol Cal: 46 mg/dL — ABNORMAL HIGH (ref 5–40)

## 2020-02-29 DIAGNOSIS — M5136 Other intervertebral disc degeneration, lumbar region: Secondary | ICD-10-CM | POA: Diagnosis not present

## 2020-02-29 DIAGNOSIS — G894 Chronic pain syndrome: Secondary | ICD-10-CM | POA: Diagnosis not present

## 2020-02-29 DIAGNOSIS — M47816 Spondylosis without myelopathy or radiculopathy, lumbar region: Secondary | ICD-10-CM | POA: Diagnosis not present

## 2020-02-29 DIAGNOSIS — M48061 Spinal stenosis, lumbar region without neurogenic claudication: Secondary | ICD-10-CM | POA: Diagnosis not present

## 2020-03-04 ENCOUNTER — Telehealth: Payer: Self-pay

## 2020-03-04 ENCOUNTER — Ambulatory Visit: Payer: Medicaid Other | Admitting: Nurse Practitioner

## 2020-03-04 ENCOUNTER — Other Ambulatory Visit: Payer: Self-pay

## 2020-03-04 ENCOUNTER — Encounter: Payer: Self-pay | Admitting: Nurse Practitioner

## 2020-03-04 VITALS — BP 125/68 | HR 73 | Temp 96.0°F | Ht 67.0 in | Wt 184.8 lb

## 2020-03-04 DIAGNOSIS — K2100 Gastro-esophageal reflux disease with esophagitis, without bleeding: Secondary | ICD-10-CM

## 2020-03-04 DIAGNOSIS — E782 Mixed hyperlipidemia: Secondary | ICD-10-CM | POA: Diagnosis not present

## 2020-03-04 DIAGNOSIS — M069 Rheumatoid arthritis, unspecified: Secondary | ICD-10-CM

## 2020-03-04 MED ORDER — NEXLETOL 180 MG PO TABS: 180.0000 mg | ORAL_TABLET | Freq: Once | ORAL | 0 refills | Status: DC

## 2020-03-04 MED ORDER — NEXLETOL 180 MG PO TABS: 1.0000 | ORAL_TABLET | Freq: Every day | ORAL | 0 refills | Status: DC

## 2020-03-04 MED ORDER — PREDNISONE 5 MG PO TABS: 5.0000 mg | ORAL_TABLET | Freq: Every day | ORAL | 0 refills | Status: DC

## 2020-03-04 MED ORDER — ICOSAPENT ETHYL 1 G PO CAPS: 2.0000 g | ORAL_CAPSULE | Freq: Two times a day (BID) | ORAL | 0 refills | Status: DC

## 2020-03-04 NOTE — Telephone Encounter (Signed)
Please advise on medication and also changing PCP

## 2020-03-04 NOTE — Telephone Encounter (Signed)
Arlys John called from [facility] requesting that the directions be corrected for the [facility] Rx and resent to them.   Directions says pt is only supposed to take 1 dose but its for 60 tablets.

## 2020-03-04 NOTE — Assessment & Plan Note (Signed)
Patient well managed on current medication.  Protonix 40 mg daily.  No changes to medication dose.  Provided education with printed handouts given Follow-up with unresolved or worsening symptoms.

## 2020-03-04 NOTE — Assessment & Plan Note (Signed)
Patient's elevated triglycerides with high cholesterol is not well managed.  Started patient on Nexletol 180 mg daily and Vascepa.  Provided education to patient with printed handouts given.  Encouraged diet and exercise modification. follow-up in 3 months, repeat lipid panels.  Rx sent to pharmacy.

## 2020-03-04 NOTE — Progress Notes (Signed)
Established Patient Office Visit  Subjective:  Patient ID: Kaitlin Fleming, female    DOB: 1960-08-03  Age: 59 y.o. MRN: [id number]  CC:  Chief Complaint  Patient presents with  . Medical Management of Chronic Issues    HPI Kaitlin Fleming presents for Mixed hyperlipidemia  Pt presents with hyperlipidemia. Patient was diagnosed in 03/05/19 Compliance with treatment has been poor; The patient is compliant with medications, maintains a low cholesterol diet , follows up as directed , and maintains an exercise regimen . The patient is experiencing some hypercholesterolemia related symptoms, like muscle aches.  GERD, Follow up:  The patient was last seen for GERD 8 months ago. Changes made since that visit include none.  She reports excellent compliance with treatment. She is not having side effects.   She is NOT experiencing choking on food or cough  ----------------------------------------------------------------------------------------- Rheumatoid Arthritis Presents for follow-up visit. She complains of pain. The symptoms have been stable. Affected locations include the right wrist and right elbow. Her pain is at a severity of 5/10. Associated symptoms include fatigue. Pertinent negatives include no fever. Compliance with total regimen is 76-100%. Compliance with medications is 76-100%. Side effects of treatment include joint pain.    Past Medical History:  Diagnosis Date  . Chronic back pain    stenosis  . Chronic pain    takes Lexapro daily  . Complication of anesthesia    pt states after surgery in 2000 had to wear a heart monitor for 3 days.  . DDD (degenerative disc disease)   . Depression   . GERD (gastroesophageal reflux disease)    takes OTC meds if needed  . History of blood transfusion    as a child  . History of bronchitis 30yrs ago  . Hyperlipidemia    takes Fenofibrate daily  . Joint pain   . Joint swelling   . Kidney stone    has one right now on the  right side but not giving any problems  . Osteoarthritis   . Peripheral edema    takes HCTZ daily   . Pneumonia 56yrs ago   hx of  . PONV (postoperative nausea and vomiting)   . Rheumatoid arthritis (HCC)   . Rheumatoid arthritis (HCC)   . Weakness    and tingling on left side    Past Surgical History:  Procedure Laterality Date  . ABDOMINAL HYSTERECTOMY  2000  . BACK SURGERY  03/10/14   lumbar fusion  . LUMBAR LAMINECTOMY/DECOMPRESSION MICRODISCECTOMY Left 12/08/2013   Procedure: LEFT LUMBAR FOUR-FIVEW, LUMBAR FIVE-SACRAL ONE LAMINECTOMY;  Surgeon: Karn Cassis, MD;  Location: [facility];  Service: Neurosurgery;  Laterality: Left;  . TUBAL LIGATION  24 yrs ago    Family History  Problem Relation Age of Onset  . Colon cancer Neg Hx     Social History   Socioeconomic History  . Marital status: Married    Spouse name: Not on file  . Number of children: Not on file  . Years of education: Not on file  . Highest education level: Not on file  Occupational History  . Not on file  Tobacco Use  . Smoking status: Never Smoker  . Smokeless tobacco: Never Used  Vaping Use  . Vaping Use: Never used  Substance and Sexual Activity  . Alcohol use: No  . Drug use: No  . Sexual activity: Yes    Birth control/protection: Surgical  Other Topics Concern  . Not on file  Social History Narrative  .  Not on file   Social Determinants of Health   Financial Resource Strain:   . Difficulty of Paying Living Expenses: Not on file  Food Insecurity:   . Worried About Programme researcher, broadcasting/film/video in the Last Year: Not on file  . Ran Out of Food in the Last Year: Not on file  Transportation Needs:   . Lack of Transportation (Medical): Not on file  . Lack of Transportation (Non-Medical): Not on file  Physical Activity:   . Days of Exercise per Week: Not on file  . Minutes of Exercise per Session: Not on file  Stress:   . Feeling of Stress : Not on file  Social Connections:   . Frequency  of Communication with Friends and Family: Not on file  . Frequency of Social Gatherings with Friends and Family: Not on file  . Attends Religious Services: Not on file  . Active Member of Clubs or Organizations: Not on file  . Attends Banker Meetings: Not on file  . Marital Status: Not on file  Intimate Partner Violence:   . Fear of Current or Ex-Partner: Not on file  . Emotionally Abused: Not on file  . Physically Abused: Not on file  . Sexually Abused: Not on file    Outpatient Medications Prior to Visit  Medication Sig Dispense Refill  . cyclobenzaprine (FLEXERIL) 10 MG tablet Take 1 tablet (10 mg total) by mouth 3 (three) times daily as needed for muscle spasms. 45 tablet 11  . esomeprazole (NEXIUM) 40 MG capsule TAKE 1 CAPSULE TWICE DAILY BEFORE A MEAL 60 capsule 3  . hydrochlorothiazide (HYDRODIURIL) 50 MG tablet Take 0.5 tablets (25 mg total) by mouth daily. 90 tablet 1  . loratadine (CLARITIN) 10 MG tablet Take 1 tablet (10 mg total) by mouth at bedtime. 30 tablet 1  . oxyCODONE-acetaminophen (PERCOCET) 10-325 MG tablet Take 1 tablet by mouth every 6 (six) hours as needed for pain.     Marland Kitchen sertraline (ZOLOFT) 50 MG tablet Take 1 tablet (50 mg total) by mouth daily. 90 tablet 3  . sucralfate (CARAFATE) 1 GM/10ML suspension Take 10 mLs (1 g total) by mouth 4 (four) times daily. 420 mL 2  . Upadacitinib ER (RINVOQ) 15 MG TB24 Take 1 tablet by mouth daily.    . valACYclovir (VALTREX) 1000 MG tablet Take 1,000 mg by mouth daily as needed (fever blisters).     . predniSONE (DELTASONE) 5 MG tablet Take 5 mg by mouth daily.     No facility-administered medications prior to visit.    Allergies  Allergen Reactions  . Macrobid [Nitrofurantoin] Shortness Of Breath and Rash  . Ciprofloxacin Nausea And Vomiting    Sick on stomach Sick on stomach  . Lotensin [Benazepril Hcl]     Face turns red swelling of face  . Plaquenil [Hydroxychloroquine Sulfate] Nausea And Vomiting and  Other (See Comments)    GI UPSET  . Macrolides And Ketolides   . Methotrexate   . Remicade [Infliximab]   . Tofacitinib Other (See Comments)    Upper respiratory infections  . Atorvastatin Other (See Comments)    Joint pain.   . Doxycycline Other (See Comments)    blisters  . Ibuprofen Hives  . Penicillins Other (See Comments)    Breaks out Has patient had a PCN reaction causing immediate rash, facial/tongue/throat swelling, SOB or lightheadedness with hypotension: yes Has patient had a PCN reaction causing severe rash involving mucus membranes or skin necrosis: no Has patient  had a PCN reaction that required hospitalization: no Has patient had a PCN reaction occurring within the last 10 years:no If all of the above answers are "NO", then may proceed with Cephalosporin use.  . Pravastatin Other (See Comments)    Rash & heart racing.  . Sulfa Antibiotics Other (See Comments)    Breaks out    ROS Review of Systems  Constitutional: Positive for fatigue. Negative for fever.  Musculoskeletal: Positive for arthritis.  All other systems reviewed and are negative.     Objective:    Physical Exam Vitals reviewed.  Constitutional:      Appearance: Normal appearance.  HENT:     Head: Normocephalic.  Eyes:     Conjunctiva/sclera: Conjunctivae normal.  Cardiovascular:     Rate and Rhythm: Normal rate and regular rhythm.     Pulses: Normal pulses.     Heart sounds: Normal heart sounds.  Pulmonary:     Effort: Pulmonary effort is normal.     Breath sounds: Normal breath sounds.  Abdominal:     General: Bowel sounds are normal.  Musculoskeletal:        General: Swelling and tenderness present.  Skin:    General: Skin is warm.  Neurological:     Mental Status: She is alert and oriented to person, place, and time.     BP 125/68   Pulse 73   Temp (!) 96 F (35.6 C) (Temporal)   Ht 5\' 7"  (1.702 m)   Wt 184 lb 12.8 oz (83.8 kg)   SpO2 96%   BMI 28.94 kg/m  Wt Readings  from Last 3 Encounters:  03/04/20 184 lb 12.8 oz (83.8 kg)  12/23/19 185 lb 6.4 oz (84.1 kg)  06/25/19 192 lb 9.6 oz (87.4 kg)     There are no preventive care reminders to display for this patient.  There are no preventive care reminders to display for this patient.  Lab Results  Component Value Date   TSH 2.870 06/04/2019   Lab Results  Component Value Date   WBC 7.0 02/24/2020   HGB 13.4 02/24/2020   HCT 40.3 02/24/2020   MCV 83 02/24/2020   PLT 226 02/24/2020   Lab Results  Component Value Date   NA 143 02/24/2020   K 3.5 02/24/2020   CO2 29 02/24/2020   GLUCOSE 85 02/24/2020   BUN 7 02/24/2020   CREATININE 0.69 02/24/2020   BILITOT 0.2 02/24/2020   ALKPHOS 112 02/24/2020   AST 13 02/24/2020   ALT 14 02/24/2020   PROT 7.0 02/24/2020   ALBUMIN 4.2 02/24/2020   CALCIUM 9.4 02/24/2020   ANIONGAP 11 11/26/2017   Lab Results  Component Value Date   CHOL 265 (H) 02/24/2020   Lab Results  Component Value Date   HDL 53 02/24/2020   Lab Results  Component Value Date   LDLCALC 166 (H) 02/24/2020   Lab Results  Component Value Date   TRIG 246 (H) 02/24/2020   Lab Results  Component Value Date   CHOLHDL 5.0 (H) 02/24/2020   No results found for: HGBA1C    Assessment & Plan:   Problem List Items Addressed This Visit      Digestive   GERD (gastroesophageal reflux disease)    Patient well managed on current medication.  Protonix 40 mg daily.  No changes to medication dose.  Provided education with printed handouts given Follow-up with unresolved or worsening symptoms.        Musculoskeletal and Integument  Rheumatoid arthritis (HCC)    Rheumatoid arthritis stable.  No changes to current medication.  Patient is managed by rheumatologist.      Relevant Medications   predniSONE (DELTASONE) 5 MG tablet     Other   Elevated triglycerides with high cholesterol - Primary    Patient's elevated triglycerides with high cholesterol is not well managed.   Started patient on Nexletol 180 mg daily and Vascepa.  Provided education to patient with printed handouts given.  Encouraged diet and exercise modification. follow-up in 3 months, repeat lipid panels.  Rx sent to pharmacy.       Relevant Medications   icosapent Ethyl (VASCEPA) 1 g capsule   Bempedoic Acid (NEXLETOL) 180 MG TABS      Meds ordered this encounter  Medications  . predniSONE (DELTASONE) 5 MG tablet    Sig: Take 1 tablet (5 mg total) by mouth daily.    Dispense:  30 tablet    Refill:  0  . DISCONTD: Bempedoic Acid (NEXLETOL) 180 MG TABS    Sig: Take 180 mg by mouth once for 1 dose.    Dispense:  60 tablet    Refill:  0    Order Specific Question:   Supervising Provider    Answer:   Arville Care A [id number]  . icosapent Ethyl (VASCEPA) 1 g capsule    Sig: Take 2 capsules (2 g total) by mouth 2 (two) times daily.    Dispense:  120 capsule    Refill:  0    Order Specific Question:   Supervising Provider    Answer:   Arville Care A [id number]  . Bempedoic Acid (NEXLETOL) 180 MG TABS    Sig: Take 1 tablet by mouth daily.    Dispense:  60 tablet    Refill:  0    Order Specific Question:   Supervising Provider    Answer:   Arville Care A [[id number]]    Follow-up: Return in about 3 months (around 06/04/2020).    Daryll Drown, NP

## 2020-03-04 NOTE — Telephone Encounter (Signed)
Please also advise on switching to you as PCP.  Will send to recent PCP and requesting PCP for approval from both.

## 2020-03-04 NOTE — Telephone Encounter (Signed)
Corrected and resent

## 2020-03-04 NOTE — Assessment & Plan Note (Signed)
Rheumatoid arthritis stable.  No changes to current medication.  Patient is managed by rheumatologist.

## 2020-03-04 NOTE — Patient Instructions (Signed)
Preventing High Cholesterol Cholesterol is a white, waxy substance similar to fat that the human body needs to help build cells. The liver makes all the cholesterol that a person's body needs. Having high cholesterol (hypercholesterolemia) increases a person's risk for heart disease and stroke. Extra (excess) cholesterol comes from the food the person eats. High cholesterol can often be prevented with diet and lifestyle changes. If you already have high cholesterol, you can control it with diet and lifestyle changes and with medicine. How can high cholesterol affect me? If you have high cholesterol, deposits (plaques) may build up on the walls of your arteries. The arteries are the blood vessels that carry blood away from your heart. Plaques make the arteries narrower and stiffer. This can limit or block blood flow and cause blood clots to form. Blood clots:  Are tiny balls of cells that form in your blood.  Can move to the heart or brain, causing a heart attack or stroke. Plaques in arteries greatly increase your risk for heart attack and stroke.Making diet and lifestyle changes can reduce your risk for these conditions that may threaten your life. What can increase my risk? This condition is more likely to develop in people who:  Eat foods that are high in saturated fat or cholesterol. Saturated fat is mostly found in: ? Foods that contain animal fat, such as red meat and some dairy products. ? Certain fatty foods made from plants, such as tropical oils.  Are overweight.  Are not getting enough exercise.  Have a family history of high cholesterol. What actions can I take to prevent this? Nutrition   Eat less saturated fat.  Avoid trans fats (partially hydrogenated oils). These are often found in margarine and in some baked goods, fried foods, and snacks bought in packages.  Avoid precooked or cured meat, such as sausages or meat loaves.  Avoid foods and drinks that have added  sugars.  Eat more fruits, vegetables, and whole grains.  Choose healthy sources of protein, such as fish, poultry, lean cuts of red meat, beans, peas, lentils, and nuts.  Choose healthy sources of fat, such as: ? Nuts. ? Vegetable oils, especially olive oil. ? Fish that have healthy fats (omega-3 fatty acids), such as mackerel or salmon. The items listed above may not be a complete list of recommended foods and beverages. Contact a dietitian for more information. Lifestyle  Lose weight if you are overweight. Losing 5-10 lb (2.3-4.5 kg) can help prevent or control high cholesterol. It can also lower your risk for diabetes and high blood pressure. Ask your health care provider to help you with a diet and exercise plan to lose weight safely.  Do not use any products that contain nicotine or tobacco, such as cigarettes, e-cigarettes, and chewing tobacco. If you need help quitting, ask your health care provider.  Limit your alcohol intake. ? Do not drink alcohol if:  Your health care provider tells you not to drink.  You are pregnant, may be pregnant, or are planning to become pregnant. ? If you drink alcohol:  Limit how much you use to:  0-1 drink a day for women.  0-2 drinks a day for men.  Be aware of how much alcohol is in your drink. In the [place] one drink equals one 12 oz bottle of beer (355 mL), one 5 oz glass of wine (148 mL), or one 1 oz glass of hard liquor (44 mL). Activity   Get enough exercise. Each week, do  at least 150 minutes of exercise that takes a medium level of effort (moderate-intensity exercise). ? This is exercise that:  Makes your heart beat faster and makes you breathe harder than usual.  Allows you to still be able to talk. ? You could exercise in short sessions several times a day or longer sessions a few times a week. For example, on 5 days each week, you could walk fast or ride your bike 3 times a day for 10 minutes each time.  Do exercises as told  by your health care provider. Medicines  In addition to diet and lifestyle changes, your health care provider may recommend medicines to help lower cholesterol. This may be a medicine to lower the amount of cholesterol your liver makes. You may need medicine if: ? Diet and lifestyle changes do not lower your cholesterol enough. ? You have high cholesterol and other risk factors for heart disease or stroke.  Take over-the-counter and prescription medicines only as told by your health care provider. General information  Manage your risk factors for high cholesterol. Talk with your health care provider about all your risk factors and how to lower your risk.  Manage other conditions that you have, such as diabetes or high blood pressure (hypertension).  Have blood tests to check your cholesterol levels at regular points in time as told by your health care provider.  Keep all follow-up visits as told by your health care provider. This is important. Where to find more information  American Heart Association: www.heart.org  National Heart, Lung, and Blood Institute: [employer] Summary  High cholesterol increases your risk for heart disease and stroke. By keeping your cholesterol level low, you can reduce your risk for these conditions.  High cholesterol can often be prevented with diet and lifestyle changes.  Work with your health care provider to manage your risk factors, and have your blood tested regularly. This information is not intended to replace advice given to you by your health care provider. Make sure you discuss any questions you have with your health care provider. Document Revised: 08/22/2018 Document Reviewed: 01/07/2016 Elsevier Patient Education  2020 [employer]   Rheumatoid Arthritis Rheumatoid arthritis (RA) is a long-term (chronic) disease that causes inflammation in your joints. RA may start slowly. It most often affects the small joints of the hands and  feet. Usually, the same joints are affected on both sides of your body. Inflammation from RA can also affect other parts of your body, including your heart, eyes, or lungs. There is no cure for RA, but medicines can help your symptoms and halt or slow down the progression of the disease. What are the causes? RA is an autoimmune disease. When you have an autoimmune disease, your body's defense system (immune system) mistakenly attacks healthy body tissues. The exact cause of RA is not known. What increases the risk? You are more likely to develop this condition if you:  Are a woman.  Have a family history of RA or other autoimmune diseases.  Have a history of smoking.  Are obese.  Have been exposed to pollutants or chemicals. What are the signs or symptoms? The first symptom of this condition may be morning stiffness that lasts longer than 30 minutes.  Symptoms usually start gradually. They are often worse in the morning. As RA progresses, symptoms may include:  Pain, stiffness, swelling, warmth, and tenderness in joints on both sides of your body.  Loss of energy.  Loss of appetite.  Weight loss.  Low-grade fever.  Dry eyes and dry mouth.  Firm lumps (rheumatoid nodules) that grow beneath your skin in areas such as your forearm bones near your elbows and on your hands.  Changes in the appearance of joints (deformity) and loss of joint function. Symptoms of this condition vary from person to person.  Symptoms of RA often come and go.  Sometimes, symptoms get worse for a period of time. These are called flares. How is this diagnosed? This condition is diagnosed based on your symptoms, medical history, and physical exam.  You may have X-rays or an MRI to check for the type of joint changes that are caused by RA. You may also have blood tests to look for:  Proteins (antibodies) that your immune system may make if you have RA. These include rheumatoid factor (RF) and  anti-CCP. ? When blood tests show these proteins, you are said to have "seropositive RA." ? When blood tests do not show these proteins, you may have "seronegative RA."  Inflammation in your blood.  A low number of red blood cells (anemia). How is this treated? The goals of treatment are to relieve pain, reduce inflammation, and slow down or stop joint damage and disability. Treatment may include:  Lifestyle changes. It is important to rest as needed, eat a healthy diet, and exercise.  Medicines. Your health care provider may adjust your medicines every 3 months until treatment goals are reached. Common medicines include: ? Pain relievers (analgesics). ? Corticosteroids and NSAIDs to reduce inflammation. ? Disease-modifying antirheumatic drugs (DMARDs) to try to slow the course of the disease. ? Biologic response modifiers to reduce inflammation and damage.  Physical therapy and occupational therapy.  Surgery, if you have severe joint damage. Joint replacement or fusing of joints may be needed. Your health care provider will work with you to identify the best treatment option for you based on assessment of the overall disease activity in your body. Follow these instructions at home: Activity  Return to your normal activities as told by your health care provider. Ask your health care provider what activities are safe for you.  Rest when you are having a flare.  Start an exercise program as told by your health care provider. General instructions  Keep all follow-up visits as told by your health care provider. This is important.  Take over-the-counter and prescription medicines only as told by your health care provider. Where to find more information  [employer]: www.rheumatology.org  Arthritis Foundation: www.arthritis.org Contact a health care provider if:  You have a flare-up of RA symptoms.  You have a fever.  You have side effects from your  medicines. Get help right away if:  You have chest pain.  You have trouble breathing.  You quickly develop a hot, painful joint that is more severe than your usual joint aches. Summary  Rheumatoid arthritis (RA) is a long-term (chronic) disease that causes inflammation in your joints.  RA is an autoimmune disease.  The goals of treatment are to relieve pain, reduce inflammation, and slow down or stop joint damage and disability. This information is not intended to replace advice given to you by your health care provider. Make sure you discuss any questions you have with your health care provider. Document Revised: 11/11/2018 Document Reviewed: 12/31/2017 Elsevier Patient Education  2020 [employer].

## 2020-03-05 NOTE — Telephone Encounter (Signed)
Yes its okay by me.

## 2020-03-07 ENCOUNTER — Telehealth: Payer: Self-pay

## 2020-03-07 NOTE — Telephone Encounter (Signed)
Pt called stating that the Rx that Je sent in for her for fish oil requires a PA with her insurance. Says her insurance wont even pay for a generic fish oil.  Please call pt once we get more info on this.

## 2020-03-07 NOTE — Telephone Encounter (Signed)
Patient was last seen here by Prudy Feeler in January of 2021, she has seen no other provider since then.  She was assigned Deliah Boston as her PCP but had never seen her.  With Je's approval, this patient's PCP was changed to [facility].  Patient informed.

## 2020-03-07 NOTE — Telephone Encounter (Signed)
   Left VM notifying patient of approval

## 2020-03-21 ENCOUNTER — Telehealth: Payer: Self-pay

## 2020-03-22 NOTE — Telephone Encounter (Signed)
Patient aware.

## 2020-03-22 NOTE — Telephone Encounter (Signed)
Patient states that she thinks Nexletol was causing nausea, facial redness and tightness in calves.  Patient stopped Nexletol 3 days ago and is no longer having any of the above symptoms.  Patient also states that medications prescribed by rheumatology can cause elevated cholesterol levels and that she has an appointment with rheumatologist on Monday and will discuss changing medication

## 2020-03-22 NOTE — Telephone Encounter (Signed)
We can recheck her cholesterol in 3 months. Patient should keep her appointment with Rheumatologist and discuss the medication prescribed by them. And follow up with unresolved or worsening symptoms.

## 2020-03-24 ENCOUNTER — Ambulatory Visit: Payer: Medicaid Other | Admitting: Nurse Practitioner

## 2020-03-28 DIAGNOSIS — M0579 Rheumatoid arthritis with rheumatoid factor of multiple sites without organ or systems involvement: Secondary | ICD-10-CM | POA: Diagnosis not present

## 2020-03-28 DIAGNOSIS — M79641 Pain in right hand: Secondary | ICD-10-CM | POA: Diagnosis not present

## 2020-03-28 DIAGNOSIS — M810 Age-related osteoporosis without current pathological fracture: Secondary | ICD-10-CM | POA: Diagnosis not present

## 2020-04-05 ENCOUNTER — Other Ambulatory Visit: Payer: Self-pay | Admitting: Nurse Practitioner

## 2020-04-05 DIAGNOSIS — K219 Gastro-esophageal reflux disease without esophagitis: Secondary | ICD-10-CM

## 2020-04-05 DIAGNOSIS — R1319 Other dysphagia: Secondary | ICD-10-CM

## 2020-04-16 DIAGNOSIS — N3001 Acute cystitis with hematuria: Secondary | ICD-10-CM | POA: Diagnosis not present

## 2020-04-16 DIAGNOSIS — M2550 Pain in unspecified joint: Secondary | ICD-10-CM | POA: Diagnosis not present

## 2020-05-16 DIAGNOSIS — M25552 Pain in left hip: Secondary | ICD-10-CM | POA: Diagnosis not present

## 2020-05-16 DIAGNOSIS — M069 Rheumatoid arthritis, unspecified: Secondary | ICD-10-CM | POA: Diagnosis not present

## 2020-05-16 DIAGNOSIS — Z79891 Long term (current) use of opiate analgesic: Secondary | ICD-10-CM | POA: Diagnosis not present

## 2020-05-16 DIAGNOSIS — M545 Low back pain, unspecified: Secondary | ICD-10-CM | POA: Diagnosis not present

## 2020-05-16 DIAGNOSIS — G603 Idiopathic progressive neuropathy: Secondary | ICD-10-CM | POA: Diagnosis not present

## 2020-05-18 ENCOUNTER — Ambulatory Visit: Payer: Medicaid Other | Admitting: Nurse Practitioner

## 2020-06-07 ENCOUNTER — Other Ambulatory Visit: Payer: Self-pay | Admitting: *Deleted

## 2020-06-07 DIAGNOSIS — M069 Rheumatoid arthritis, unspecified: Secondary | ICD-10-CM

## 2020-06-07 MED ORDER — PREDNISONE 5 MG PO TABS: 5.0000 mg | ORAL_TABLET | Freq: Every day | ORAL | 0 refills | Status: DC

## 2020-06-08 ENCOUNTER — Ambulatory Visit: Payer: Medicaid Other | Admitting: Nurse Practitioner

## 2020-06-09 ENCOUNTER — Ambulatory Visit: Payer: Medicaid Other | Admitting: Nurse Practitioner

## 2020-06-09 ENCOUNTER — Telehealth: Payer: Self-pay

## 2020-06-09 ENCOUNTER — Other Ambulatory Visit: Payer: Self-pay | Admitting: Nurse Practitioner

## 2020-06-09 ENCOUNTER — Encounter: Payer: Self-pay | Admitting: Nurse Practitioner

## 2020-06-09 ENCOUNTER — Other Ambulatory Visit: Payer: Self-pay

## 2020-06-09 VITALS — BP 151/78 | HR 98 | Temp 95.6°F | Ht 67.0 in | Wt 186.4 lb

## 2020-06-09 DIAGNOSIS — R0981 Nasal congestion: Secondary | ICD-10-CM | POA: Insufficient documentation

## 2020-06-09 DIAGNOSIS — E782 Mixed hyperlipidemia: Secondary | ICD-10-CM | POA: Diagnosis not present

## 2020-06-09 DIAGNOSIS — I10 Essential (primary) hypertension: Secondary | ICD-10-CM

## 2020-06-09 LAB — LIPID PANEL
Chol/HDL Ratio: 4.2 ratio (ref 0.0–4.4)
Cholesterol, Total: 185 mg/dL (ref 100–199)
HDL: 44 mg/dL (ref >39)
LDL Chol Calc (NIH): 106 mg/dL — ABNORMAL HIGH (ref 0–99)
Triglycerides: 205 mg/dL — ABNORMAL HIGH (ref 0–149)
VLDL Cholesterol Cal: 35 mg/dL (ref 5–40)

## 2020-06-09 MED ORDER — DM-GUAIFENESIN ER 30-600 MG PO TB12: 1.0000 | ORAL_TABLET | Freq: Two times a day (BID) | ORAL | 0 refills | Status: DC

## 2020-06-09 MED ORDER — ACETAMINOPHEN 500 MG PO TABS: 500.0000 mg | ORAL_TABLET | Freq: Four times a day (QID) | ORAL | 0 refills | Status: DC | PRN

## 2020-06-09 MED ORDER — HYDROCHLOROTHIAZIDE 25 MG PO TABS: 25.0000 mg | ORAL_TABLET | Freq: Every day | ORAL | 3 refills | Status: DC

## 2020-06-09 MED ORDER — HYDROCHLOROTHIAZIDE 50 MG PO TABS: 25.0000 mg | ORAL_TABLET | Freq: Every day | ORAL | 1 refills | Status: DC

## 2020-06-09 MED ORDER — FLUTICASONE PROPIONATE 50 MCG/ACT NA SUSP: 2.0000 | Freq: Every day | NASAL | 6 refills | Status: DC

## 2020-06-09 NOTE — Assessment & Plan Note (Addendum)
Symptoms new for patient in the last 4 days.  Patient is reporting sore throat, headache, sinus pressure, slight cough.  Patient denies nausea, vomiting, diarrhea, abdominal pain, loss of taste. Rapid test negative outside clinic in the last 2 days. Education provided with printed handouts given. Started patient on , Tylenol for headache,flonase, guaifenesin for congestion cough.  Repeat COVID-19 swab. Follow-up with worsening unresolved symptoms.

## 2020-06-09 NOTE — Telephone Encounter (Signed)
Fixed per Arlys John

## 2020-06-09 NOTE — Patient Instructions (Addendum)
High Cholesterol  High cholesterol is a condition in which the blood has high levels of a white, waxy substance similar to fat (cholesterol). The liver makes all the cholesterol that the body needs. The human body needs small amounts of cholesterol to help build cells. A person gets extra or excess cholesterol from the food that he or she eats. The blood carries cholesterol from the liver to the rest of the body. If you have high cholesterol, deposits (plaques) may build up on the walls of your arteries. Arteries are the blood vessels that carry blood away from your heart. These plaques make the arteries narrow and stiff. Cholesterol plaques increase your risk for heart attack and stroke. Work with your health care provider to keep your cholesterol levels in a healthy range. What increases the risk? The following factors may make you more likely to develop this condition:  Eating foods that are high in animal fat (saturated fat) or cholesterol.  Being overweight.  Not getting enough exercise.  A family history of high cholesterol (familial hypercholesterolemia).  Use of tobacco products.  Having diabetes. What are the signs or symptoms? There are no symptoms of this condition. How is this diagnosed? This condition may be diagnosed based on the results of a blood test.  If you are older than 61 years of age, your health care provider may check your cholesterol levels every 4-6 years.  You may be checked more often if you have high cholesterol or other risk factors for heart disease. The blood test for cholesterol measures:  "Bad" cholesterol, or LDL cholesterol. This is the main type of cholesterol that causes heart disease. The desired level is less than 100 mg/dL.  "Good" cholesterol, or HDL cholesterol. HDL helps protect against heart disease by cleaning the arteries and carrying the LDL to the liver for processing. The desired level for HDL is 60 mg/dL or higher.  Triglycerides.  These are fats that your body can store or burn for energy. The desired level is less than 150 mg/dL.  Total cholesterol. This measures the total amount of cholesterol in your blood and includes LDL, HDL, and triglycerides. The desired level is less than 200 mg/dL. How is this treated? This condition may be treated with:  Diet changes. You may be asked to eat foods that have more fiber and less saturated fats or added sugar.  Lifestyle changes. These may include regular exercise, maintaining a healthy weight, and quitting use of tobacco products.  Medicines. These are given when diet and lifestyle changes have not worked. You may be prescribed a statin medicine to help lower your cholesterol levels. Follow these instructions at home: Eating and drinking  Eat a healthy, balanced diet. This diet includes: ? Daily servings of a variety of fresh, frozen, or canned fruits and vegetables. ? Daily servings of whole grain foods that are rich in fiber. ? Foods that are low in saturated fats and trans fats. These include poultry and fish without skin, lean cuts of meat, and low-fat dairy products. ? A variety of fish, especially oily fish that contain omega-3 fatty acids. Aim to eat fish at least 2 times a week.  Avoid foods and drinks that have added sugar.  Use healthy cooking methods, such as roasting, grilling, broiling, baking, poaching, steaming, and stir-frying. Do not fry your food except for stir-frying.   Lifestyle  Get regular exercise. Aim to exercise for a total of 150 minutes a week. Increase your activity level by doing activities   such as gardening, walking, and taking the stairs.  Do not use any products that contain nicotine or tobacco, such as cigarettes, e-cigarettes, and chewing tobacco. If you need help quitting, ask your health care provider.   General instructions  Take over-the-counter and prescription medicines only as told by your health care provider.  Keep all  follow-up visits as told by your health care provider. This is important. Where to find more information  American Heart Association: www.heart.org  National Heart, Lung, and Blood Institute: [employer].is Contact a health care provider if:  You have trouble achieving or maintaining a healthy diet or weight.  You are starting an exercise program.  You are unable to stop smoking. Get help right away if:  You have chest pain.  You have trouble breathing.  You have any symptoms of a stroke. "BE FAST" is an easy way to remember the main warning signs of a stroke: ? B - Balance. Signs are dizziness, sudden trouble walking, or loss of balance. ? E - Eyes. Signs are trouble seeing or a sudden change in vision. ? F - Face. Signs are sudden weakness or numbness of the face, or the face or eyelid drooping on one side. ? A - Arms. Signs are weakness or numbness in an arm. This happens suddenly and usually on one side of the body. ? S - Speech. Signs are sudden trouble speaking, slurred speech, or trouble understanding what people say. ? T - Time. Time to call emergency services. Write down what time symptoms started.  You have other signs of a stroke, such as: ? A sudden, severe headache with no known cause. ? Nausea or vomiting. ? Seizure. These symptoms may represent a serious problem that is an emergency. Do not wait to see if the symptoms will go away. Get medical help right away. Call your local emergency services (911 in the [place] Do not drive yourself to the hospital. Summary  Cholesterol plaques increase your risk for heart attack and stroke. Work with your health care provider to keep your cholesterol levels in a healthy range.  Eat a healthy, balanced diet, get regular exercise, and maintain a healthy weight.  Do not use any products that contain nicotine or tobacco, such as cigarettes, e-cigarettes, and chewing tobacco.  Get help right away if you have any symptoms of a  stroke. This information is not intended to replace advice given to you by your health care provider. Make sure you discuss any questions you have with your health care provider. Document Revised: 03/30/2019 Document Reviewed: 03/30/2019 Elsevier Patient Education  2021 [employer] Inc.  Sinusitis, Adult Sinusitis is inflammation of your sinuses. Sinuses are hollow spaces in the bones around your face. Your sinuses are located:  Around your eyes.  In the middle of your forehead.  Behind your nose.  In your cheekbones. Mucus normally drains out of your sinuses. When your nasal tissues become inflamed or swollen, mucus can become trapped or blocked. This allows bacteria, viruses, and fungi to grow, which leads to infection. Most infections of the sinuses are caused by a virus. Sinusitis can develop quickly. It can last for up to 4 weeks (acute) or for more than 12 weeks (chronic). Sinusitis often develops after a cold. What are the causes? This condition is caused by anything that creates swelling in the sinuses or stops mucus from draining. This includes:  Allergies.  Asthma.  Infection from bacteria or viruses.  Deformities or blockages in your nose or sinuses.  Abnormal growths in the nose (nasal polyps).  Pollutants, such as chemicals or irritants in the air.  Infection from fungi (rare). What increases the risk? You are more likely to develop this condition if you:  Have a weak body defense system (immune system).  Do a lot of swimming or diving.  Overuse nasal sprays.  Smoke. What are the signs or symptoms? The main symptoms of this condition are pain and a feeling of pressure around the affected sinuses. Other symptoms include:  Stuffy nose or congestion.  Thick drainage from your nose.  Swelling and warmth over the affected sinuses.  Headache.  Upper toothache.  A cough that may get worse at night.  Extra mucus that collects in the throat or the back of  the nose (postnasal drip).  Decreased sense of smell and taste.  Fatigue.  A fever.  Sore throat.  Bad breath. How is this diagnosed? This condition is diagnosed based on:  Your symptoms.  Your medical history.  A physical exam.  Tests to find out if your condition is acute or chronic. This may include: ? Checking your nose for nasal polyps. ? Viewing your sinuses using a device that has a light (endoscope). ? Testing for allergies or bacteria. ? Imaging tests, such as an MRI or CT scan. In rare cases, a bone biopsy may be done to rule out more serious types of fungal sinus disease. How is this treated? Treatment for sinusitis depends on the cause and whether your condition is chronic or acute.  If caused by a virus, your symptoms should go away on their own within 10 days. You may be given medicines to relieve symptoms. They include: ? Medicines that shrink swollen nasal passages (topical intranasal decongestants). ? Medicines that treat allergies (antihistamines). ? A spray that eases inflammation of the nostrils (topical intranasal corticosteroids). ? Rinses that help get rid of thick mucus in your nose (nasal saline washes).  If caused by bacteria, your health care provider may recommend waiting to see if your symptoms improve. Most bacterial infections will get better without antibiotic medicine. You may be given antibiotics if you have: ? A severe infection. ? A weak immune system.  If caused by narrow nasal passages or nasal polyps, you may need to have surgery. Follow these instructions at home: Medicines  Take, use, or apply over-the-counter and prescription medicines only as told by your health care provider. These may include nasal sprays.  If you were prescribed an antibiotic medicine, take it as told by your health care provider. Do not stop taking the antibiotic even if you start to feel better. Hydrate and humidify  Drink enough fluid to keep your urine  pale yellow. Staying hydrated will help to thin your mucus.  Use a cool mist humidifier to keep the humidity level in your home above 50%.  Inhale steam for 10-15 minutes, 3-4 times a day, or as told by your health care provider. You can do this in the bathroom while a hot shower is running.  Limit your exposure to cool or dry air.   Rest  Rest as much as possible.  Sleep with your head raised (elevated).  Make sure you get enough sleep each night. General instructions  Apply a warm, moist washcloth to your face 3-4 times a day or as told by your health care provider. This will help with discomfort.  Wash your hands often with soap and water to reduce your exposure to germs. If soap and water  are not available, use hand sanitizer.  Do not smoke. Avoid being around people who are smoking (secondhand smoke).  Keep all follow-up visits as told by your health care provider. This is important.   Contact a health care provider if:  You have a fever.  Your symptoms get worse.  Your symptoms do not improve within 10 days. Get help right away if:  You have a severe headache.  You have persistent vomiting.  You have severe pain or swelling around your face or eyes.  You have vision problems.  You develop confusion.  Your neck is stiff.  You have trouble breathing. Summary  Sinusitis is soreness and inflammation of your sinuses. Sinuses are hollow spaces in the bones around your face.  This condition is caused by nasal tissues that become inflamed or swollen. The swelling traps or blocks the flow of mucus. This allows bacteria, viruses, and fungi to grow, which leads to infection.  If you were prescribed an antibiotic medicine, take it as told by your health care provider. Do not stop taking the antibiotic even if you start to feel better.  Keep all follow-up visits as told by your health care provider. This is important. This information is not intended to replace advice  given to you by your health care provider. Make sure you discuss any questions you have with your health care provider. Document Revised: 09/30/2017 Document Reviewed: 09/30/2017 Elsevier Patient Education  2021 [employer].

## 2020-06-09 NOTE — Telephone Encounter (Signed)
Patient wants to know if an antibiotic was supposed to get sent in for her ? Aware that prednisone was sent in but patient states she thought she was sending in an antibiotic.  Also she would like to know if her hydrochlorothiazide can be sent in as a 25mg  tablet ? Patient just takes half of a 50mg  tablet.

## 2020-06-09 NOTE — Telephone Encounter (Signed)
No antibiotic was sent due to allergies, I will send BP medication as 25 mg to pharmacy

## 2020-06-09 NOTE — Assessment & Plan Note (Signed)
Patient not taking statin due to side effect of itching.  Repeat lipid panel today results pending.  Continue low-cholesterol healthy diet and exercise regimen as tolerated.  Follow-up in 3 months.

## 2020-06-09 NOTE — Telephone Encounter (Signed)
Patient aware and verbalizes understanding. 

## 2020-06-09 NOTE — Progress Notes (Signed)
Established Patient Office Visit  Subjective:  Patient ID: Kaitlin Fleming, female    DOB: 1960-10-18  Age: 60 y.o. MRN: [id number]  CC:  Chief Complaint  Patient presents with  . Hyperlipidemia    3 month follow up    . Facial Pain    Patient states she has been having facial pressure x 2 weeks     HPI Kaitlin Fleming presents for Mixed hyperlipidemia  Patient presents with hyperlipidemia. Patient was diagnosed in 20/22/2021. Compliance with treatment has been fair; The patient is compliant with her fish oil but not taking her statin medications, maintains a low cholesterol diet , follows up as directed , and maintains an exercise regimen as tolerated. The patient denies experiencing any hypercholesterolemia related symptoms.   Sinusitis: Patient presents with acute sinusitis. The patient reports acute sinus infections for a few days.  Her symptoms include nasal congestion, cough, headaches, puffiness of the eyes.  There has not been a history of purulent rhinorrhea, periorbital venous congestion. There has not been a history of chronic otitis media or pharyngotonsillitis.  Prior antibiotic therapy has included: does not recall. Other medications have included nothing additional.  She has not had allergy testing which was positive.     Past Medical History:  Diagnosis Date  . Chronic back pain    stenosis  . Chronic pain    takes Lexapro daily  . Complication of anesthesia    pt states after surgery in 2000 had to wear a heart monitor for 3 days.  . DDD (degenerative disc disease)   . Depression   . GERD (gastroesophageal reflux disease)    takes OTC meds if needed  . History of blood transfusion    as a child  . History of bronchitis 80yrs ago  . Hyperlipidemia    takes Fenofibrate daily  . Joint pain   . Joint swelling   . Kidney stone    has one right now on the right side but not giving any problems  . Osteoarthritis   . Peripheral edema    takes HCTZ daily   .  Pneumonia 80yrs ago   hx of  . PONV (postoperative nausea and vomiting)   . Rheumatoid arthritis (HCC)   . Rheumatoid arthritis (HCC)   . Weakness    and tingling on left side    Past Surgical History:  Procedure Laterality Date  . ABDOMINAL HYSTERECTOMY  2000  . BACK SURGERY  03/10/14   lumbar fusion  . LUMBAR LAMINECTOMY/DECOMPRESSION MICRODISCECTOMY Left 12/08/2013   Procedure: LEFT LUMBAR FOUR-FIVEW, LUMBAR FIVE-SACRAL ONE LAMINECTOMY;  Surgeon: Karn Cassis, MD;  Location: [facility];  Service: Neurosurgery;  Laterality: Left;  . TUBAL LIGATION  24 yrs ago    Family History  Problem Relation Age of Onset  . Colon cancer Neg Hx     Social History   Socioeconomic History  . Marital status: Married    Spouse name: Not on file  . Number of children: Not on file  . Years of education: Not on file  . Highest education level: Not on file  Occupational History  . Not on file  Tobacco Use  . Smoking status: Never Smoker  . Smokeless tobacco: Never Used  Vaping Use  . Vaping Use: Never used  Substance and Sexual Activity  . Alcohol use: No  . Drug use: No  . Sexual activity: Yes    Birth control/protection: Surgical  Other Topics Concern  . Not on file  Social History Narrative  . Not on file   Social Determinants of Health   Financial Resource Strain: Not on file  Food Insecurity: Not on file  Transportation Needs: Not on file  Physical Activity: Not on file  Stress: Not on file  Social Connections: Not on file  Intimate Partner Violence: Not on file    Outpatient Medications Prior to Visit  Medication Sig Dispense Refill  . esomeprazole (NEXIUM) 40 MG capsule TAKE 1 CAPSULE TWICE DAILY BEFORE A MEAL 60 capsule 3  . loratadine (CLARITIN) 10 MG tablet Take 1 tablet (10 mg total) by mouth at bedtime. 30 tablet 1  . oxyCODONE-acetaminophen (PERCOCET) 10-325 MG tablet Take 1 tablet by mouth every 6 (six) hours as needed for pain.     . predniSONE  (DELTASONE) 5 MG tablet Take 1 tablet (5 mg total) by mouth daily. 30 tablet 0  . sertraline (ZOLOFT) 50 MG tablet Take 1 tablet (50 mg total) by mouth daily. 90 tablet 3  . hydrochlorothiazide (HYDRODIURIL) 50 MG tablet Take 0.5 tablets (25 mg total) by mouth daily. 90 tablet 1  . Bempedoic Acid (NEXLETOL) 180 MG TABS Take 1 tablet by mouth daily. 60 tablet 0  . cyclobenzaprine (FLEXERIL) 10 MG tablet Take 1 tablet (10 mg total) by mouth 3 (three) times daily as needed for muscle spasms. 45 tablet 11  . icosapent Ethyl (VASCEPA) 1 g capsule Take 2 capsules (2 g total) by mouth 2 (two) times daily. 120 capsule 0  . sucralfate (CARAFATE) 1 GM/10ML suspension Take 10 mLs (1 g total) by mouth 4 (four) times daily. 420 mL 2  . Upadacitinib ER (RINVOQ) 15 MG TB24 Take 1 tablet by mouth daily.    . valACYclovir (VALTREX) 1000 MG tablet Take 1,000 mg by mouth daily as needed (fever blisters).      No facility-administered medications prior to visit.      ROS Review of Systems  Constitutional: Negative for appetite change, chills, fatigue and fever.  HENT: Positive for ear pain, sinus pressure and sinus pain. Negative for sneezing, sore throat, tinnitus and voice change.   Eyes: Negative.   Respiratory: Positive for cough. Negative for shortness of breath.   Gastrointestinal: Negative.   Genitourinary: Negative.   Musculoskeletal: Negative.   Psychiatric/Behavioral: Negative.   All other systems reviewed and are negative.     Objective:    Physical Exam Vitals reviewed.  Constitutional:      Appearance: Normal appearance.  HENT:     Nose: Congestion present.     Mouth/Throat:     Pharynx: No posterior oropharyngeal erythema.  Eyes:     Conjunctiva/sclera: Conjunctivae normal.  Cardiovascular:     Rate and Rhythm: Normal rate and regular rhythm.     Pulses: Normal pulses.     Heart sounds: Normal heart sounds.  Pulmonary:     Breath sounds: Normal breath sounds.  Abdominal:      General: Bowel sounds are normal.  Musculoskeletal:        General: Normal range of motion.  Skin:    General: Skin is warm.  Neurological:     Mental Status: She is alert and oriented to person, place, and time.  Psychiatric:        Behavior: Behavior normal.     BP (!) 151/78   Pulse 98   Temp (!) 95.6 F (35.3 C) (Temporal)   Ht 5\' 7"  (1.702 m)   Wt 186 lb 6.4 oz (84.6 kg)  SpO2 96%   BMI 29.19 kg/m  Wt Readings from Last 3 Encounters:  06/09/20 186 lb 6.4 oz (84.6 kg)  03/04/20 184 lb 12.8 oz (83.8 kg)  12/23/19 185 lb 6.4 oz (84.1 kg)     There are no preventive care reminders to display for this patient.  There are no preventive care reminders to display for this patient.  Lab Results  Component Value Date   TSH 2.870 06/04/2019   Lab Results  Component Value Date   WBC 7.0 02/24/2020   HGB 13.4 02/24/2020   HCT 40.3 02/24/2020   MCV 83 02/24/2020   PLT 226 02/24/2020   Lab Results  Component Value Date   NA 143 02/24/2020   K 3.5 02/24/2020   CO2 29 02/24/2020   GLUCOSE 85 02/24/2020   BUN 7 02/24/2020   CREATININE 0.69 02/24/2020   BILITOT 0.2 02/24/2020   ALKPHOS 112 02/24/2020   AST 13 02/24/2020   ALT 14 02/24/2020   PROT 7.0 02/24/2020   ALBUMIN 4.2 02/24/2020   CALCIUM 9.4 02/24/2020   ANIONGAP 11 11/26/2017   Lab Results  Component Value Date   CHOL 265 (H) 02/24/2020   Lab Results  Component Value Date   HDL 53 02/24/2020   Lab Results  Component Value Date   LDLCALC 166 (H) 02/24/2020   Lab Results  Component Value Date   TRIG 246 (H) 02/24/2020   Lab Results  Component Value Date   CHOLHDL 5.0 (H) 02/24/2020   No results found for: HGBA1C    Assessment & Plan:   Problem List Items Addressed This Visit      Respiratory   Congestion of nasal sinus    Symptoms new for patient in the last 4 days.  Patient is reporting sore throat, headache, sinus pressure, slight cough.  Patient denies nausea, vomiting, diarrhea,  abdominal pain, loss of taste. Rapid test negative outside clinic in the last 2 days. Education provided with printed handouts given. Started patient on , Tylenol for headache,flonase, guaifenesin for congestion cough.  Repeat COVID-19 swab. Follow-up with worsening unresolved symptoms.      Relevant Medications   fluticasone (FLONASE) 50 MCG/ACT nasal spray   acetaminophen (TYLENOL) 500 MG tablet   dextromethorphan-guaiFENesin (MUCINEX DM) 30-600 MG 12hr tablet   Other Relevant Orders   Novel Coronavirus, NAA ([facility])     Other   Elevated triglycerides with high cholesterol - Primary    Patient not taking statin due to side effect of itching.  Repeat lipid panel today results pending.  Continue low-cholesterol healthy diet and exercise regimen as tolerated.  Follow-up in 3 months.      Relevant Medications   hydrochlorothiazide (HYDRODIURIL) 50 MG tablet   Other Relevant Orders   Lipid panel   Lipid Panel      Meds ordered this encounter  Medications  . hydrochlorothiazide (HYDRODIURIL) 50 MG tablet    Sig: Take 0.5 tablets (25 mg total) by mouth daily.    Dispense:  90 tablet    Refill:  1  . fluticasone (FLONASE) 50 MCG/ACT nasal spray    Sig: Place 2 sprays into both nostrils daily.    Dispense:  16 g    Refill:  6    Order Specific Question:   Supervising Provider    Answer:   Raliegh Ip [[id number]]  . acetaminophen (TYLENOL) 500 MG tablet    Sig: Take 1 tablet (500 mg total) by mouth every 6 (six) hours as needed.  Dispense:  30 tablet    Refill:  0    Order Specific Question:   Supervising Provider    Answer:   Raliegh Ip [[id number]]  . dextromethorphan-guaiFENesin (MUCINEX DM) 30-600 MG 12hr tablet    Sig: Take 1 tablet by mouth 2 (two) times daily.    Dispense:  30 tablet    Refill:  0    Order Specific Question:   Supervising Provider    Answer:   Raliegh Ip [[id number]]    Follow-up: Return in about 3 months (around 09/07/2020).     Daryll Drown, NP

## 2020-06-10 LAB — SARS-COV-2, NAA 2 DAY TAT

## 2020-06-10 LAB — NOVEL CORONAVIRUS, NAA: SARS-CoV-2, NAA: NOT DETECTED

## 2020-06-11 ENCOUNTER — Other Ambulatory Visit: Payer: Self-pay

## 2020-06-11 ENCOUNTER — Emergency Department (HOSPITAL_COMMUNITY)
Admission: EM | Admit: 2020-06-11 | Discharge: 2020-06-11 | Disposition: A | Discharge status: Home / Self Care | Payer: Medicaid Other | Attending: Emergency Medicine | Admitting: Emergency Medicine

## 2020-06-11 ENCOUNTER — Encounter (HOSPITAL_COMMUNITY): Payer: Self-pay

## 2020-06-11 DIAGNOSIS — Z79899 Other long term (current) drug therapy: Secondary | ICD-10-CM | POA: Insufficient documentation

## 2020-06-11 DIAGNOSIS — M069 Rheumatoid arthritis, unspecified: Secondary | ICD-10-CM | POA: Insufficient documentation

## 2020-06-11 DIAGNOSIS — R0981 Nasal congestion: Secondary | ICD-10-CM | POA: Diagnosis present

## 2020-06-11 DIAGNOSIS — Z20822 Contact with and (suspected) exposure to covid-19: Secondary | ICD-10-CM | POA: Insufficient documentation

## 2020-06-11 DIAGNOSIS — J0100 Acute maxillary sinusitis, unspecified: Secondary | ICD-10-CM | POA: Diagnosis not present

## 2020-06-11 DIAGNOSIS — E860 Dehydration: Secondary | ICD-10-CM | POA: Diagnosis not present

## 2020-06-11 DIAGNOSIS — E876 Hypokalemia: Secondary | ICD-10-CM | POA: Diagnosis not present

## 2020-06-11 LAB — BASIC METABOLIC PANEL
Anion gap: 11 (ref 5–15)
BUN: 8 mg/dL (ref 6–20)
CO2: 30 mmol/L (ref 22–32)
Calcium: 9.2 mg/dL (ref 8.9–10.3)
Chloride: 90 mmol/L — ABNORMAL LOW (ref 98–111)
Creatinine, Ser: 0.63 mg/dL (ref 0.44–1.00)
GFR, Estimated: >60 mL/min (ref >60)
Glucose, Bld: 116 mg/dL — ABNORMAL HIGH (ref 70–99)
Potassium: 2.9 mmol/L — ABNORMAL LOW (ref 3.5–5.1)
Sodium: 131 mmol/L — ABNORMAL LOW (ref 135–145)

## 2020-06-11 LAB — CBC WITH DIFFERENTIAL/PLATELET
Abs Immature Granulocytes: 0.02 10*3/uL (ref 0.00–0.07)
Basophils Absolute: 0 10*3/uL (ref 0.0–0.1)
Basophils Relative: 0 %
Eosinophils Absolute: 0.2 10*3/uL (ref 0.0–0.5)
Eosinophils Relative: 2 %
HCT: 40.4 % (ref 36.0–46.0)
Hemoglobin: 12.9 g/dL (ref 12.0–15.0)
Immature Granulocytes: 0 %
Lymphocytes Relative: 10 %
Lymphs Abs: 0.8 10*3/uL (ref 0.7–4.0)
MCH: 27.2 pg (ref 26.0–34.0)
MCHC: 31.9 g/dL (ref 30.0–36.0)
MCV: 85.1 fL (ref 80.0–100.0)
Monocytes Absolute: 0.9 10*3/uL (ref 0.1–1.0)
Monocytes Relative: 12 %
Neutro Abs: 5.8 10*3/uL (ref 1.7–7.7)
Neutrophils Relative %: 76 %
Platelets: 252 10*3/uL (ref 150–400)
RBC: 4.75 MIL/uL (ref 3.87–5.11)
RDW: 13.2 % (ref 11.5–15.5)
WBC: 7.7 10*3/uL (ref 4.0–10.5)
nRBC: 0 % (ref 0.0–0.2)

## 2020-06-11 MED ORDER — ONDANSETRON HCL 4 MG/2ML IJ SOLN
4.0000 mg | Freq: Once | INTRAMUSCULAR | Status: AC
  Administered 2020-06-11: 4 mg via INTRAVENOUS
  Filled 2020-06-11: qty 2

## 2020-06-11 MED ORDER — POTASSIUM CHLORIDE CRYS ER 20 MEQ PO TBCR
40.0000 meq | EXTENDED_RELEASE_TABLET | Freq: Once | ORAL | Status: AC
  Administered 2020-06-11: 40 meq via ORAL
  Filled 2020-06-11: qty 2

## 2020-06-11 MED ORDER — ONDANSETRON 4 MG PO TBDP: ORAL_TABLET | ORAL | 0 refills | Status: DC

## 2020-06-11 MED ORDER — METHYLPREDNISOLONE SODIUM SUCC 125 MG IJ SOLR
125.0000 mg | Freq: Once | INTRAMUSCULAR | Status: AC
  Administered 2020-06-11: 125 mg via INTRAVENOUS
  Filled 2020-06-11: qty 2

## 2020-06-11 MED ORDER — CEPHALEXIN 500 MG PO CAPS: 500.0000 mg | ORAL_CAPSULE | Freq: Four times a day (QID) | ORAL | 0 refills | Status: DC

## 2020-06-11 MED ORDER — SODIUM CHLORIDE 0.9 % IV BOLUS (SEPSIS)
1000.0000 mL | Freq: Once | INTRAVENOUS | Status: AC
  Administered 2020-06-11: 1000 mL via INTRAVENOUS

## 2020-06-11 NOTE — ED Triage Notes (Signed)
Pt arrives from home via POV for sinus pressure, congestion and headache. Pt recently seen and treated by PCP for Sinusitis. Pt reports decreased appetite. Pt recently tested negative for covid. Pt reports she just needs something because "im sick and need something."

## 2020-06-11 NOTE — ED Provider Notes (Signed)
[name] EMERGENCY DEPARTMENT Provider Note   CSN: [id number] Arrival date & time: 06/11/20  1843     History Chief Complaint  Patient presents with  . Nasal Congestion    Kaitlin Fleming is a 60 y.o. female.  Patient complains of sinus congestion.  She has had sinusitis before she also has been vomiting.  The history is provided by the patient and medical records. No language interpreter was used.  URI Presenting symptoms: congestion   Presenting symptoms: no cough and no fatigue   Severity:  Moderate Onset quality:  Sudden Timing:  Constant Progression:  Worsening Chronicity:  New Relieved by:  Nothing Worsened by:  Nothing Associated symptoms: no headaches        Past Medical History:  Diagnosis Date  . Chronic back pain    stenosis  . Chronic pain    takes Lexapro daily  . Complication of anesthesia    pt states after surgery in 2000 had to wear a heart monitor for 3 days.  . DDD (degenerative disc disease)   . Depression   . GERD (gastroesophageal reflux disease)    takes OTC meds if needed  . History of blood transfusion    as a child  . History of bronchitis 4yrs ago  . Hyperlipidemia    takes Fenofibrate daily  . Joint pain   . Joint swelling   . Kidney stone    has one right now on the right side but not giving any problems  . Osteoarthritis   . Peripheral edema    takes HCTZ daily   . Pneumonia 50yrs ago   hx of  . PONV (postoperative nausea and vomiting)   . Rheumatoid arthritis (HCC)   . Rheumatoid arthritis (HCC)   . Weakness    and tingling on left side    Patient Active Problem List   Diagnosis Date Noted  . Congestion of nasal sinus 06/09/2020  . Elevated triglycerides with high cholesterol 03/04/2020  . GERD (gastroesophageal reflux disease) 06/25/2019  . Dysphagia 06/25/2019  . Fatty liver disease, nonalcoholic 06/25/2019  . Preventative health care 06/25/2019  . Rheumatoid arthritis (HCC) 08/02/2017  . Influenza due to  influenza virus, type B 08/02/2017  . Influenza B 08/02/2017  . Lumbar radiculopathy, chronic 06/22/2014  . Lumbar degenerative disc disease 03/10/2014  . Lumbar stenosis without neurogenic claudication 12/08/2013    Past Surgical History:  Procedure Laterality Date  . ABDOMINAL HYSTERECTOMY  2000  . BACK SURGERY  03/10/14   lumbar fusion  . LUMBAR LAMINECTOMY/DECOMPRESSION MICRODISCECTOMY Left 12/08/2013   Procedure: LEFT LUMBAR FOUR-FIVEW, LUMBAR FIVE-SACRAL ONE LAMINECTOMY;  Surgeon: Karn Cassis, MD;  Location: [facility];  Service: Neurosurgery;  Laterality: Left;  . TUBAL LIGATION  24 yrs ago     OB History    Gravida      Para      Term      Preterm      AB      Living  2     SAB      IAB      Ectopic      Multiple      Live Births              Family History  Problem Relation Age of Onset  . Colon cancer Neg Hx     Social History   Tobacco Use  . Smoking status: Never Smoker  . Smokeless tobacco: Never Used  Vaping Use  .  Vaping Use: Never used  Substance Use Topics  . Alcohol use: No  . Drug use: No    Home Medications Prior to Admission medications   Medication Sig Start Date End Date Taking? Authorizing Provider  cephALEXin (KEFLEX) 500 MG capsule Take 1 capsule (500 mg total) by mouth 4 (four) times daily. 06/11/20  Yes Bethann Berkshire, MD  ondansetron (ZOFRAN ODT) 4 MG disintegrating tablet 4mg  ODT q4 hours prn nausea/vomit 06/11/20  Yes Bethann Berkshire, MD  acetaminophen (TYLENOL) 500 MG tablet Take 1 tablet (500 mg total) by mouth every 6 (six) hours as needed. 06/09/20   Daryll Drown, NP  dextromethorphan-guaiFENesin (MUCINEX DM) 30-600 MG 12hr tablet Take 1 tablet by mouth 2 (two) times daily. 06/09/20   Daryll Drown, NP  esomeprazole (NEXIUM) 40 MG capsule TAKE 1 CAPSULE TWICE DAILY BEFORE A MEAL 04/06/20   Tiffany Kocher, PA-C  fluticasone (FLONASE) 50 MCG/ACT nasal spray Place 2 sprays into both nostrils daily. 06/09/20    Daryll Drown, NP  hydrochlorothiazide (HYDRODIURIL) 25 MG tablet Take 1 tablet (25 mg total) by mouth daily. 06/09/20   Daryll Drown, NP  loratadine (CLARITIN) 10 MG tablet Take 1 tablet (10 mg total) by mouth at bedtime. 08/04/17   Vassie Loll, MD  oxyCODONE-acetaminophen (PERCOCET) 10-325 MG tablet Take 1 tablet by mouth every 6 (six) hours as needed for pain.     [provider]  predniSONE (DELTASONE) 5 MG tablet Take 1 tablet (5 mg total) by mouth daily. 06/07/20   Daryll Drown, NP  sertraline (ZOLOFT) 50 MG tablet Take 1 tablet (50 mg total) by mouth daily. 08/19/19   Junie Spencer, FNP    Allergies    Macrobid [nitrofurantoin], Ciprofloxacin, Lotensin [benazepril hcl], Plaquenil [hydroxychloroquine sulfate], Macrolides and ketolides, Methotrexate, Remicade [infliximab], Tofacitinib, Atorvastatin, Doxycycline, Ibuprofen, Penicillins, Pravastatin, and Sulfa antibiotics  Review of Systems   Review of Systems  Constitutional: Negative for appetite change and fatigue.  HENT: Positive for congestion. Negative for ear discharge and sinus pressure.        Sinus congestion  Eyes: Negative for discharge.  Respiratory: Negative for cough.   Cardiovascular: Negative for chest pain.  Gastrointestinal: Negative for abdominal pain and diarrhea.  Genitourinary: Negative for frequency and hematuria.  Musculoskeletal: Negative for back pain.  Skin: Negative for rash.  Neurological: Negative for seizures and headaches.  Psychiatric/Behavioral: Negative for hallucinations.    Physical Exam Updated Vital Signs BP 124/84 (BP Location: Right Arm)   Pulse (!) 124   Temp 98.6 F (37 C) (Oral)   Resp 20   Ht 5\' 7"  (1.702 m)   Wt 84.5 kg   SpO2 95%   BMI 29.19 kg/m   Physical Exam Vitals and nursing note reviewed.  Constitutional:      Appearance: She is well-developed.  HENT:     Head: Normocephalic.     Comments: Tender sinuses    Nose: Nose normal.  Eyes:      General: No scleral icterus.    Extraocular Movements: EOM normal.     Conjunctiva/sclera: Conjunctivae normal.  Neck:     Thyroid: No thyromegaly.  Cardiovascular:     Rate and Rhythm: Normal rate and regular rhythm.     Heart sounds: No murmur heard. No friction rub. No gallop.   Pulmonary:     Breath sounds: No stridor. No wheezing or rales.  Chest:     Chest wall: No tenderness.  Abdominal:  General: There is no distension.     Tenderness: There is no abdominal tenderness. There is no rebound.  Musculoskeletal:        General: No edema. Normal range of motion.     Cervical back: Neck supple.  Lymphadenopathy:     Cervical: No cervical adenopathy.  Skin:    Findings: No erythema or rash.  Neurological:     Mental Status: She is alert and oriented to person, place, and time.     Motor: No abnormal muscle tone.     Coordination: Coordination normal.  Psychiatric:        Mood and Affect: Mood and affect normal.        Behavior: Behavior normal.     ED Results / Procedures / Treatments   Labs (all labs ordered are listed, but only abnormal results are displayed) Labs Reviewed  BASIC METABOLIC PANEL - Abnormal; Notable for the following components:      Result Value   Sodium 131 (*)    Potassium 2.9 (*)    Chloride 90 (*)    Glucose, Bld 116 (*)    All other components within normal limits  SARS CORONAVIRUS 2 (TAT 6-24 HRS)  CBC WITH DIFFERENTIAL/PLATELET    EKG None  Radiology No results found.  Procedures Procedures   Medications Ordered in ED Medications  potassium chloride SA (KLOR-CON) CR tablet 40 mEq (has no administration in time range)  sodium chloride 0.9 % bolus 1,000 mL (0 mLs Intravenous Stopped 06/11/20 2028)  ondansetron (ZOFRAN) injection 4 mg (4 mg Intravenous Given 06/11/20 2006)  methylPREDNISolone sodium succinate (SOLU-MEDROL) 125 mg/2 mL injection 125 mg (125 mg Intravenous Given 06/11/20 2006)    ED Course  I have reviewed the  triage vital signs and the nursing notes.  Pertinent labs & imaging results that were available during my care of the patient were reviewed by me and considered in my medical decision making (see chart for details).    MDM Rules/Calculators/A&P                          Patient with sinusitis.  Also some hypokalemia.  And dehydration.  Patient given IV fluids potassium and will be treated with Keflex and follow-up with PCP Final Clinical Impression(s) / ED Diagnoses Final diagnoses:  Subacute maxillary sinusitis    Rx / DC Orders ED Discharge Orders         Ordered    cephALEXin (KEFLEX) 500 MG capsule  4 times daily        06/11/20 2055    ondansetron (ZOFRAN ODT) 4 MG disintegrating tablet        06/11/20 2055           Bethann Berkshire, MD 06/11/20 2105

## 2020-06-11 NOTE — Discharge Instructions (Addendum)
Drink plenty of fluids and follow-up with your doctor next week for recheck °

## 2020-06-12 LAB — SARS CORONAVIRUS 2 (TAT 6-24 HRS): SARS Coronavirus 2: NEGATIVE

## 2020-06-13 ENCOUNTER — Other Ambulatory Visit: Payer: Self-pay | Admitting: Nurse Practitioner

## 2020-06-13 DIAGNOSIS — E782 Mixed hyperlipidemia: Secondary | ICD-10-CM

## 2020-06-13 MED ORDER — EZETIMIBE 10 MG PO TABS: 10.0000 mg | ORAL_TABLET | Freq: Every day | ORAL | 1 refills | Status: DC

## 2020-06-16 ENCOUNTER — Telehealth: Payer: Self-pay

## 2020-06-16 DIAGNOSIS — E876 Hypokalemia: Secondary | ICD-10-CM | POA: Diagnosis not present

## 2020-06-16 DIAGNOSIS — R630 Anorexia: Secondary | ICD-10-CM | POA: Diagnosis not present

## 2020-06-16 DIAGNOSIS — R059 Cough, unspecified: Secondary | ICD-10-CM | POA: Diagnosis not present

## 2020-06-16 DIAGNOSIS — R110 Nausea: Secondary | ICD-10-CM | POA: Diagnosis not present

## 2020-06-16 DIAGNOSIS — J329 Chronic sinusitis, unspecified: Secondary | ICD-10-CM | POA: Diagnosis not present

## 2020-06-16 DIAGNOSIS — R509 Fever, unspecified: Secondary | ICD-10-CM | POA: Diagnosis not present

## 2020-06-16 DIAGNOSIS — E860 Dehydration: Secondary | ICD-10-CM | POA: Diagnosis not present

## 2020-06-16 DIAGNOSIS — J029 Acute pharyngitis, unspecified: Secondary | ICD-10-CM | POA: Diagnosis not present

## 2020-06-16 DIAGNOSIS — E871 Hypo-osmolality and hyponatremia: Secondary | ICD-10-CM | POA: Diagnosis not present

## 2020-06-16 NOTE — Telephone Encounter (Signed)
Pt called stating that she recently had a visit with Je for a sinus infection. Was advised to take Mucinex and flonase. Pt said she did that but medicines were not helping so she went to the ER last Saturday and was prescribed Keflex. Pt says she still feels bad and yesterday she only took 2 pills of the Keflex.  Needs advise on what to do or if Je can prescribe her something else.  Symptoms: Pressure from eye to jaw/tooth area, drainage in throat, nausea

## 2020-06-17 DIAGNOSIS — E871 Hypo-osmolality and hyponatremia: Secondary | ICD-10-CM | POA: Diagnosis not present

## 2020-06-17 DIAGNOSIS — R059 Cough, unspecified: Secondary | ICD-10-CM | POA: Diagnosis not present

## 2020-06-17 DIAGNOSIS — J329 Chronic sinusitis, unspecified: Secondary | ICD-10-CM | POA: Diagnosis not present

## 2020-06-17 DIAGNOSIS — R630 Anorexia: Secondary | ICD-10-CM | POA: Diagnosis not present

## 2020-06-17 DIAGNOSIS — R509 Fever, unspecified: Secondary | ICD-10-CM | POA: Diagnosis not present

## 2020-06-17 DIAGNOSIS — J029 Acute pharyngitis, unspecified: Secondary | ICD-10-CM | POA: Diagnosis not present

## 2020-06-17 DIAGNOSIS — R110 Nausea: Secondary | ICD-10-CM | POA: Diagnosis not present

## 2020-06-17 DIAGNOSIS — E860 Dehydration: Secondary | ICD-10-CM | POA: Diagnosis not present

## 2020-06-17 DIAGNOSIS — E876 Hypokalemia: Secondary | ICD-10-CM | POA: Diagnosis not present

## 2020-06-17 NOTE — Telephone Encounter (Signed)
Keflex was given for 10days from the emergency department.  Please advise patient to complete antibiotics.  And follow-up with worsening symptoms next week.  If over the weekend symptoms are getting worse to please seek emergency care.

## 2020-06-25 DIAGNOSIS — R5383 Other fatigue: Secondary | ICD-10-CM | POA: Diagnosis not present

## 2020-06-25 DIAGNOSIS — I10 Essential (primary) hypertension: Secondary | ICD-10-CM | POA: Diagnosis not present

## 2020-06-25 DIAGNOSIS — E876 Hypokalemia: Secondary | ICD-10-CM | POA: Diagnosis not present

## 2020-06-25 DIAGNOSIS — E871 Hypo-osmolality and hyponatremia: Secondary | ICD-10-CM | POA: Diagnosis not present

## 2020-06-25 DIAGNOSIS — D72820 Lymphocytosis (symptomatic): Secondary | ICD-10-CM | POA: Diagnosis not present

## 2020-06-25 DIAGNOSIS — M05739 Rheumatoid arthritis with rheumatoid factor of unspecified wrist without organ or systems involvement: Secondary | ICD-10-CM | POA: Diagnosis not present

## 2020-06-27 ENCOUNTER — Other Ambulatory Visit: Payer: Self-pay | Admitting: *Deleted

## 2020-06-27 DIAGNOSIS — M069 Rheumatoid arthritis, unspecified: Secondary | ICD-10-CM

## 2020-06-27 MED ORDER — PREDNISONE 5 MG PO TABS: 5.0000 mg | ORAL_TABLET | Freq: Every day | ORAL | 1 refills | Status: DC

## 2020-07-11 DIAGNOSIS — Z79891 Long term (current) use of opiate analgesic: Secondary | ICD-10-CM | POA: Diagnosis not present

## 2020-07-11 DIAGNOSIS — M069 Rheumatoid arthritis, unspecified: Secondary | ICD-10-CM | POA: Diagnosis not present

## 2020-07-11 DIAGNOSIS — M545 Low back pain, unspecified: Secondary | ICD-10-CM | POA: Diagnosis not present

## 2020-07-11 DIAGNOSIS — M5416 Radiculopathy, lumbar region: Secondary | ICD-10-CM | POA: Diagnosis not present

## 2020-07-11 DIAGNOSIS — F419 Anxiety disorder, unspecified: Secondary | ICD-10-CM | POA: Diagnosis not present

## 2020-07-11 DIAGNOSIS — M25552 Pain in left hip: Secondary | ICD-10-CM | POA: Diagnosis not present

## 2020-07-11 DIAGNOSIS — G603 Idiopathic progressive neuropathy: Secondary | ICD-10-CM | POA: Diagnosis not present

## 2020-07-21 DIAGNOSIS — D72820 Lymphocytosis (symptomatic): Secondary | ICD-10-CM | POA: Diagnosis not present

## 2020-07-21 DIAGNOSIS — I10 Essential (primary) hypertension: Secondary | ICD-10-CM | POA: Diagnosis not present

## 2020-07-21 DIAGNOSIS — E876 Hypokalemia: Secondary | ICD-10-CM | POA: Diagnosis not present

## 2020-07-21 DIAGNOSIS — R5383 Other fatigue: Secondary | ICD-10-CM | POA: Diagnosis not present

## 2020-07-21 DIAGNOSIS — E871 Hypo-osmolality and hyponatremia: Secondary | ICD-10-CM | POA: Diagnosis not present

## 2020-07-21 DIAGNOSIS — M25571 Pain in right ankle and joints of right foot: Secondary | ICD-10-CM | POA: Diagnosis not present

## 2020-07-25 DIAGNOSIS — L0231 Cutaneous abscess of buttock: Secondary | ICD-10-CM | POA: Diagnosis not present

## 2020-08-02 ENCOUNTER — Telehealth: Payer: Self-pay

## 2020-08-02 DIAGNOSIS — I10 Essential (primary) hypertension: Secondary | ICD-10-CM | POA: Diagnosis not present

## 2020-08-02 DIAGNOSIS — Z90710 Acquired absence of both cervix and uterus: Secondary | ICD-10-CM | POA: Diagnosis not present

## 2020-08-02 DIAGNOSIS — L0231 Cutaneous abscess of buttock: Secondary | ICD-10-CM | POA: Diagnosis not present

## 2020-08-02 NOTE — Telephone Encounter (Signed)
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 She has taken 1/2 of her Clindamycin prescription. The antibiotic is causing severe burning in her throat. Nexium BID is not helping. Pt called the urgent care back to see if the ATB could be changed. They informed the pt that the only other option for her seen she has allergies to a lot of ATB's is IV Vancomycin.  Is there another option for pt? She is worried about the MRSA spreading to her bloodstream. The site where she has the infection is still painful, red and draining.  Informed pt that she needs an appointment but she would like to ask the provider before anything is scheduled.

## 2020-08-03 NOTE — Telephone Encounter (Signed)
Lmtcb.

## 2020-08-03 NOTE — Telephone Encounter (Signed)
MRSA is a serious infection most often time is treated inpatient with IV antibiotic, if patient is unable to take oral clindamycin then IV Vancomycin may be the way to go.

## 2020-08-09 DIAGNOSIS — M545 Low back pain, unspecified: Secondary | ICD-10-CM | POA: Diagnosis not present

## 2020-08-09 DIAGNOSIS — M0579 Rheumatoid arthritis with rheumatoid factor of multiple sites without organ or systems involvement: Secondary | ICD-10-CM | POA: Diagnosis not present

## 2020-08-09 DIAGNOSIS — M06321 Rheumatoid nodule, right elbow: Secondary | ICD-10-CM | POA: Diagnosis not present

## 2020-08-09 DIAGNOSIS — M810 Age-related osteoporosis without current pathological fracture: Secondary | ICD-10-CM | POA: Diagnosis not present

## 2020-08-12 NOTE — Telephone Encounter (Signed)
Spoke with patient, she went back to urgent care and they told her it looked much better.

## 2020-09-05 ENCOUNTER — Telehealth: Payer: Self-pay

## 2020-09-05 NOTE — Telephone Encounter (Signed)
Form typed up & placed on providers desk 

## 2020-09-05 NOTE — Telephone Encounter (Signed)
Patient needs [place] to fill out a handicap form for her and call her when it is ready for pickup. Pt requested to get 2 handicap stickers; one for each vehicle.  Call when ready.. [phone]

## 2020-09-06 NOTE — Telephone Encounter (Signed)
Pt aware form ready for pickup 

## 2020-09-09 ENCOUNTER — Other Ambulatory Visit: Payer: Self-pay

## 2020-09-09 ENCOUNTER — Encounter: Payer: Self-pay | Admitting: Nurse Practitioner

## 2020-09-09 ENCOUNTER — Ambulatory Visit: Payer: Medicaid Other | Admitting: Nurse Practitioner

## 2020-09-09 VITALS — BP 125/72 | HR 75 | Temp 97.2°F | Ht 67.0 in | Wt 181.0 lb

## 2020-09-09 DIAGNOSIS — F324 Major depressive disorder, single episode, in partial remission: Secondary | ICD-10-CM | POA: Diagnosis not present

## 2020-09-09 DIAGNOSIS — K219 Gastro-esophageal reflux disease without esophagitis: Secondary | ICD-10-CM

## 2020-09-09 DIAGNOSIS — E782 Mixed hyperlipidemia: Secondary | ICD-10-CM | POA: Diagnosis not present

## 2020-09-09 NOTE — Patient Instructions (Signed)
Preventing High Cholesterol Cholesterol is a white, waxy substance similar to fat that the human body needs to help build cells. The liver makes all the cholesterol that a person's body needs. Having high cholesterol (hypercholesterolemia) increases your risk for heart disease and stroke. Extra or excess cholesterol comes from the food that you eat. High cholesterol can often be prevented with diet and lifestyle changes. If you already have high cholesterol, you can control it with diet, lifestyle changes, and medicines. How can high cholesterol affect me? If you have high cholesterol, fatty deposits (plaques) may build up on the walls of your blood vessels. The blood vessels that carry blood away from your heart are called arteries. Plaques make the arteries narrower and stiffer. This in turn can:  Restrict or block blood flow and cause blood clots to form.  Increase your risk for heart attack and stroke. What can increase my risk for high cholesterol? This condition is more likely to develop in people who:  Eat foods that are high in saturated fat or cholesterol. Saturated fat is mostly found in foods that come from animal sources.  Are overweight.  Are not getting enough exercise.  Have a family history of high cholesterol (familial hypercholesterolemia). What actions can I take to prevent this? Nutrition  Eat less saturated fat.  Avoid trans fats (partially hydrogenated oils). These are often found in margarine and in some baked goods, fried foods, and snacks bought in packages.  Avoid precooked or cured meat, such as bacon, sausages, or meat loaves.  Avoid foods and drinks that have added sugars.  Eat more fruits, vegetables, and whole grains.  Choose healthy sources of protein, such as fish, poultry, lean cuts of red meat, beans, peas, lentils, and nuts.  Choose healthy sources of fat, such as: ? Nuts. ? Vegetable oils, especially olive oil. ? Fish that have healthy fats,  such as omega-3 fatty acids. These fish include mackerel or salmon.   Lifestyle  Lose weight if you are overweight. Maintaining a healthy body mass index (BMI) can help prevent or control high cholesterol. It can also lower your risk for diabetes and high blood pressure. Ask your health care provider to help you with a diet and exercise plan to lose weight safely.  Do not use any products that contain nicotine or tobacco, such as cigarettes, e-cigarettes, and chewing tobacco. If you need help quitting, ask your health care provider. Alcohol use  Do not drink alcohol if: ? Your health care provider tells you not to drink. ? You are pregnant, may be pregnant, or are planning to become pregnant.  If you drink alcohol: ? Limit how much you use to:  0-1 drink a day for women.  0-2 drinks a day for men. ? Be aware of how much alcohol is in your drink. In the [place] one drink equals one 12 oz bottle of beer (355 mL), one 5 oz glass of wine (148 mL), or one 1 oz glass of hard liquor (44 mL). Activity  Get enough exercise. Do exercises as told by your health care provider.  Each week, do at least 150 minutes of exercise that takes a medium level of effort (moderate-intensity exercise). This kind of exercise: ? Makes your heart beat faster while allowing you to still be able to talk. ? Can be done in short sessions several times a day or longer sessions a few times a week. For example, on 5 days each week, you could walk fast or ride   your bike 3 times a day for 10 minutes each time.   Medicines  Your health care provider may recommend medicines to help lower cholesterol. This may be a medicine to lower the amount of cholesterol that your liver makes. You may need medicine if: ? Diet and lifestyle changes have not lowered your cholesterol enough. ? You have high cholesterol and other risk factors for heart disease or stroke.  Take over-the-counter and prescription medicines only as told by your  health care provider. General information  Manage your risk factors for high cholesterol. Talk with your health care provider about all your risk factors and how to lower your risk.  Manage other conditions that you have, such as diabetes or high blood pressure (hypertension).  Have blood tests to check your cholesterol levels at regular points in time as told by your health care provider.  Keep all follow-up visits as told by your health care provider. This is important. Where to find more information  American Heart Association: www.heart.org  National Heart, Lung, and Blood Institute: https://wilson-eaton.com/ Summary  High cholesterol increases your risk for heart disease and stroke. By keeping your cholesterol level low, you can reduce your risk for these conditions.  High cholesterol can often be prevented with diet and lifestyle changes.  Work with your health care provider to manage your risk factors, and have your blood tested regularly. This information is not intended to replace advice given to you by your health care provider. Make sure you discuss any questions you have with your health care provider. Document Revised: 02/10/2019 Document Reviewed: 02/10/2019 Elsevier Patient Education  2021 [employer]. Hypertension, Adult Hypertension is another name for high blood pressure. High blood pressure forces your heart to work harder to pump blood. This can cause problems over time. There are two numbers in a blood pressure reading. There is a top number (systolic) over a bottom number (diastolic). It is best to have a blood pressure that is below 120/80. Healthy choices can help lower your blood pressure, or you may need medicine to help lower it. What are the causes? The cause of this condition is not known. Some conditions may be related to high blood pressure. What increases the risk?  Smoking.  Having type 2 diabetes mellitus, high cholesterol, or both.  Not getting enough  exercise or physical activity.  Being overweight.  Having too much fat, sugar, calories, or salt (sodium) in your diet.  Drinking too much alcohol.  Having long-term (chronic) kidney disease.  Having a family history of high blood pressure.  Age. Risk increases with age.  Race. You may be at higher risk if you are African American.  Gender. Men are at higher risk than women before age 2. After age 89, women are at higher risk than men.  Having obstructive sleep apnea.  Stress. What are the signs or symptoms?  High blood pressure may not cause symptoms. Very high blood pressure (hypertensive crisis) may cause: ? Headache. ? Feelings of worry or nervousness (anxiety). ? Shortness of breath. ? Nosebleed. ? A feeling of being sick to your stomach (nausea). ? Throwing up (vomiting). ? Changes in how you see. ? Very bad chest pain. ? Seizures. How is this treated?  This condition is treated by making healthy lifestyle changes, such as: ? Eating healthy foods. ? Exercising more. ? Drinking less alcohol.  Your health care provider may prescribe medicine if lifestyle changes are not enough to get your blood pressure under control, and  if: ? Your top number is above 130. ? Your bottom number is above 80.  Your personal target blood pressure may vary. Follow these instructions at home: Eating and drinking  If told, follow the DASH eating plan. To follow this plan: ? Fill one half of your plate at each meal with fruits and vegetables. ? Fill one fourth of your plate at each meal with whole grains. Whole grains include whole-wheat pasta, brown rice, and whole-grain bread. ? Eat or drink low-fat dairy products, such as skim milk or low-fat yogurt. ? Fill one fourth of your plate at each meal with low-fat (lean) proteins. Low-fat proteins include fish, chicken without skin, eggs, beans, and tofu. ? Avoid fatty meat, cured and processed meat, or chicken with skin. ? Avoid  pre-made or processed food.  Eat less than 1,500 mg of salt each day.  Do not drink alcohol if: ? Your doctor tells you not to drink. ? You are pregnant, may be pregnant, or are planning to become pregnant.  If you drink alcohol: ? Limit how much you use to:  0-1 drink a day for women.  0-2 drinks a day for men. ? Be aware of how much alcohol is in your drink. In the [place] one drink equals one 12 oz bottle of beer (355 mL), one 5 oz glass of wine (148 mL), or one 1 oz glass of hard liquor (44 mL).   Lifestyle  Work with your doctor to stay at a healthy weight or to lose weight. Ask your doctor what the best weight is for you.  Get at least 30 minutes of exercise most days of the week. This may include walking, swimming, or biking.  Get at least 30 minutes of exercise that strengthens your muscles (resistance exercise) at least 3 days a week. This may include lifting weights or doing Pilates.  Do not use any products that contain nicotine or tobacco, such as cigarettes, e-cigarettes, and chewing tobacco. If you need help quitting, ask your doctor.  Check your blood pressure at home as told by your doctor.  Keep all follow-up visits as told by your doctor. This is important.   Medicines  Take over-the-counter and prescription medicines only as told by your doctor. Follow directions carefully.  Do not skip doses of blood pressure medicine. The medicine does not work as well if you skip doses. Skipping doses also puts you at risk for problems.  Ask your doctor about side effects or reactions to medicines that you should watch for. Contact a doctor if you:  Think you are having a reaction to the medicine you are taking.  Have headaches that keep coming back (recurring).  Feel dizzy.  Have swelling in your ankles.  Have trouble with your vision. Get help right away if you:  Get a very bad headache.  Start to feel mixed up (confused).  Feel weak or numb.  Feel  faint.  Have very bad pain in your: ? Chest. ? Belly (abdomen).  Throw up more than once.  Have trouble breathing. Summary  Hypertension is another name for high blood pressure.  High blood pressure forces your heart to work harder to pump blood.  For most people, a normal blood pressure is less than 120/80.  Making healthy choices can help lower blood pressure. If your blood pressure does not get lower with healthy choices, you may need to take medicine. This information is not intended to replace advice given to you by your health  care provider. Make sure you discuss any questions you have with your health care provider. Document Revised: 01/08/2018 Document Reviewed: 01/08/2018 Elsevier Patient Education  2021 [employer].

## 2020-09-09 NOTE — Assessment & Plan Note (Signed)
Symptoms are controlled intermittently.  Patient is currently on Nexium 40 mg capsule twice daily before meals.  Patient reports scheduling  EGD with GI in the next few weeks.

## 2020-09-09 NOTE — Progress Notes (Signed)
Established Patient Office Visit  Subjective:  Patient ID: Kaitlin Fleming, female    DOB: [birth year]  Age: 60 y.o. MRN: [id number]  CC:  Chief Complaint  Patient presents with  . Hyperlipidemia  . Gastroesophageal Reflux    HPI Kaitlin Fleming presents for follow up Mixed hyperlipidemia. Patient was diagnosed in 03/04/2020. Compliance with treatment has been poor; the patient is compliant with medications, maintains a low cholesterol diet , follows up as directed , and maintains an exercise regimen . The patient denies experiencing any hypercholesterolemia related symptoms.  Zetia 10 mg tablet by mouth daily.  Patient reports not currently taking medication.  GERD, Follow up:  The patient was last seen for GERD 1 year ago. Changes made since that visit include continue Nexium 40 mg capsule twice daily before meals  She reports good compliance with treatment. She is not having side effects.   She IS experiencing belching and heartburn. She is NOT experiencing chest pain, cough or hematemesis  -----------------------------------------------------------------------------------------   Past Medical History:  Diagnosis Date  . Chronic back pain    stenosis  . Chronic pain    takes Lexapro daily  . Complication of anesthesia    pt states after surgery in 2000 had to wear a heart monitor for 3 days.  . DDD (degenerative disc disease)   . Depression   . GERD (gastroesophageal reflux disease)    takes OTC meds if needed  . History of blood transfusion    as a child  . History of bronchitis 54yrs ago  . Hyperlipidemia    takes Fenofibrate daily  . Joint pain   . Joint swelling   . Kidney stone    has one right now on the right side but not giving any problems  . Osteoarthritis   . Peripheral edema    takes HCTZ daily   . Pneumonia 68yrs ago   hx of  . PONV (postoperative nausea and vomiting)   . Rheumatoid arthritis (HCC)   . Rheumatoid arthritis (HCC)   . Weakness     and tingling on left side    Past Surgical History:  Procedure Laterality Date  . ABDOMINAL HYSTERECTOMY  2000  . BACK SURGERY  03/10/14   lumbar fusion  . LUMBAR LAMINECTOMY/DECOMPRESSION MICRODISCECTOMY Left 12/08/2013   Procedure: LEFT LUMBAR FOUR-FIVEW, LUMBAR FIVE-SACRAL ONE LAMINECTOMY;  Surgeon: Karn Cassis, MD;  Location: [facility];  Service: Neurosurgery;  Laterality: Left;  . TUBAL LIGATION  24 yrs ago    Family History  Problem Relation Age of Onset  . Colon cancer Neg Hx     Social History   Socioeconomic History  . Marital status: Married    Spouse name: Not on file  . Number of children: Not on file  . Years of education: Not on file  . Highest education level: Not on file  Occupational History  . Not on file  Tobacco Use  . Smoking status: Never Smoker  . Smokeless tobacco: Never Used  Vaping Use  . Vaping Use: Never used  Substance and Sexual Activity  . Alcohol use: No  . Drug use: No  . Sexual activity: Yes    Birth control/protection: Surgical  Other Topics Concern  . Not on file  Social History Narrative  . Not on file   Social Determinants of Health   Financial Resource Strain: Not on file  Food Insecurity: Not on file  Transportation Needs: Not on file  Physical Activity: Not on file  Stress: Not on file  Social Connections: Not on file  Intimate Partner Violence: Not on file    Outpatient Medications Prior to Visit  Medication Sig Dispense Refill  . acetaminophen (TYLENOL) 500 MG tablet Take 1 tablet (500 mg total) by mouth every 6 (six) hours as needed. 30 tablet 0  . esomeprazole (NEXIUM) 40 MG capsule TAKE 1 CAPSULE TWICE DAILY BEFORE A MEAL 60 capsule 3  . fluticasone (FLONASE) 50 MCG/ACT nasal spray Place 2 sprays into both nostrils daily. 16 g 6  . hydrochlorothiazide (HYDRODIURIL) 25 MG tablet Take 1 tablet (25 mg total) by mouth daily. 90 tablet 3  . loratadine (CLARITIN) 10 MG tablet Take 1 tablet (10 mg total) by  mouth at bedtime. 30 tablet 1  . oxyCODONE-acetaminophen (PERCOCET) 10-325 MG tablet Take 1 tablet by mouth every 6 (six) hours as needed for pain.     . predniSONE (DELTASONE) 5 MG tablet Take 1 tablet (5 mg total) by mouth daily. 30 tablet 1  . sertraline (ZOLOFT) 50 MG tablet Take 1 tablet (50 mg total) by mouth daily. 90 tablet 3  . ezetimibe (ZETIA) 10 MG tablet Take 1 tablet (10 mg total) by mouth daily. (Patient not taking: Reported on 09/09/2020) 90 tablet 1  . ondansetron (ZOFRAN ODT) 4 MG disintegrating tablet 4mg  ODT q4 hours prn nausea/vomit (Patient not taking: Reported on 09/09/2020) 12 tablet 0  . cephALEXin (KEFLEX) 500 MG capsule Take 1 capsule (500 mg total) by mouth 4 (four) times daily. 40 capsule 0  . dextromethorphan-guaiFENesin (MUCINEX DM) 30-600 MG 12hr tablet Take 1 tablet by mouth 2 (two) times daily. 30 tablet 0   No facility-administered medications prior to visit.    Allergies  Allergen Reactions  . Macrobid [Nitrofurantoin] Shortness Of Breath and Rash  . Ciprofloxacin Nausea And Vomiting    Sick on stomach Sick on stomach  . Lotensin [Benazepril Hcl]     Face turns red swelling of face  . Plaquenil [Hydroxychloroquine Sulfate] Nausea And Vomiting and Other (See Comments)    GI UPSET  . Macrolides And Ketolides   . Methotrexate   . Remicade [Infliximab]   . Tofacitinib Other (See Comments)    Upper respiratory infections  . Atorvastatin Other (See Comments)    Joint pain.   . Doxycycline Other (See Comments)    blisters  . Ibuprofen Hives  . Penicillins Other (See Comments)    Breaks out Has patient had a PCN reaction causing immediate rash, facial/tongue/throat swelling, SOB or lightheadedness with hypotension: yes Has patient had a PCN reaction causing severe rash involving mucus membranes or skin necrosis: no Has patient had a PCN reaction that required hospitalization: no Has patient had a PCN reaction occurring within the last 10 years:no If all  of the above answers are "NO", then may proceed with Cephalosporin use.  . Pravastatin Other (See Comments)    Rash & heart racing.  . Sulfa Antibiotics Other (See Comments)    Breaks out   Flowsheet Row Office Visit from 09/09/2020 in [facility]  PHQ-9 Total Score 0     ROS Review of Systems  Constitutional: Negative.   HENT: Negative.   Respiratory: Negative.   Cardiovascular: Negative.   Gastrointestinal: Negative.   Genitourinary: Negative.   Musculoskeletal: Negative.   Skin: Negative for rash.  All other systems reviewed and are negative.     Objective:    Physical Exam Vitals and nursing note reviewed.  HENT:  Head: Normocephalic.     Nose: Nose normal.  Eyes:     Conjunctiva/sclera: Conjunctivae normal.  Cardiovascular:     Rate and Rhythm: Normal rate and regular rhythm.  Pulmonary:     Effort: Pulmonary effort is normal.     Breath sounds: Normal breath sounds.  Abdominal:     General: Bowel sounds are normal.  Musculoskeletal:        General: Normal range of motion.  Skin:    Findings: No rash.  Neurological:     Mental Status: She is oriented to person, place, and time.  Psychiatric:        Attention and Perception: Attention and perception normal.        Mood and Affect: Mood normal. Mood is not anxious or depressed.        Speech: Speech normal.        Behavior: Behavior normal. Behavior is cooperative.     BP 125/72   Pulse 75   Temp (!) 97.2 F (36.2 C) (Temporal)   Ht 5\' 7"  (1.702 m)   Wt 181 lb (82.1 kg)   SpO2 93%   BMI 28.35 kg/m  Wt Readings from Last 3 Encounters:  09/09/20 181 lb (82.1 kg)  06/11/20 186 lb 6.4 oz (84.5 kg)  06/09/20 186 lb 6.4 oz (84.6 kg)     There are no preventive care reminders to display for this patient.  There are no preventive care reminders to display for this patient.  Lab Results  Component Value Date   TSH 2.870 06/04/2019   Lab Results  Component Value Date    WBC 7.7 06/11/2020   HGB 12.9 06/11/2020   HCT 40.4 06/11/2020   MCV 85.1 06/11/2020   PLT 252 06/11/2020   Lab Results  Component Value Date   NA 131 (L) 06/11/2020   K 2.9 (L) 06/11/2020   CO2 30 06/11/2020   GLUCOSE 116 (H) 06/11/2020   BUN 8 06/11/2020   CREATININE 0.63 06/11/2020   BILITOT 0.2 02/24/2020   ALKPHOS 112 02/24/2020   AST 13 02/24/2020   ALT 14 02/24/2020   PROT 7.0 02/24/2020   ALBUMIN 4.2 02/24/2020   CALCIUM 9.2 06/11/2020   ANIONGAP 11 06/11/2020   Lab Results  Component Value Date   CHOL 185 06/09/2020   Lab Results  Component Value Date   HDL 44 06/09/2020   Lab Results  Component Value Date   LDLCALC 106 (H) 06/09/2020   Lab Results  Component Value Date   TRIG 205 (H) 06/09/2020   Lab Results  Component Value Date   CHOLHDL 4.2 06/09/2020   No results found for: HGBA1C    Assessment & Plan:   Problem List Items Addressed This Visit      Digestive   GERD (gastroesophageal reflux disease)    Symptoms are controlled intermittently.  Patient is currently on Nexium 40 mg capsule twice daily before meals.  Patient reports scheduling  EGD with GI in the next few weeks.         Other   Elevated triglycerides with high cholesterol - Primary    Completed lipid panel results pending.  Patient is not currently taking medication as prescribed.  Provided education with printed handouts given.  Advised patient to take medication as prescribed and follow-up in 3 months repeat lipids panel      Relevant Orders   Lipid Panel   Comprehensive metabolic panel   Depression, major, single episode, in partial remission (HCC)  Depression symptoms well controlled on Zoloft 50 mg tablet by mouth daily.  Completed PHQ-9 results 0         No orders of the defined types were placed in this encounter.   Follow-up: Return in about 3 months (around 12/09/2020).    Daryll Drown, NP

## 2020-09-09 NOTE — Assessment & Plan Note (Signed)
Completed lipid panel results pending.  Patient is not currently taking medication as prescribed.  Provided education with printed handouts given.  Advised patient to take medication as prescribed and follow-up in 3 months repeat lipids panel

## 2020-09-09 NOTE — Assessment & Plan Note (Signed)
Depression symptoms well controlled on Zoloft 50 mg tablet by mouth daily.  Completed PHQ-9 results 0

## 2020-09-10 LAB — COMPREHENSIVE METABOLIC PANEL
ALT: 13 IU/L (ref 0–32)
AST: 12 IU/L (ref 0–40)
Albumin/Globulin Ratio: 1.2 (ref 1.2–2.2)
Albumin: 3.8 g/dL (ref 3.8–4.9)
Alkaline Phosphatase: 113 IU/L (ref 44–121)
BUN/Creatinine Ratio: 14 (ref 12–28)
BUN: 9 mg/dL (ref 8–27)
Bilirubin Total: 0.2 mg/dL (ref 0.0–1.2)
CO2: 27 mmol/L (ref 20–29)
Calcium: 8.9 mg/dL (ref 8.7–10.3)
Chloride: 103 mmol/L (ref 96–106)
Creatinine, Ser: 0.66 mg/dL (ref 0.57–1.00)
Globulin, Total: 3.2 g/dL (ref 1.5–4.5)
Glucose: 87 mg/dL (ref 65–99)
Potassium: 4 mmol/L (ref 3.5–5.2)
Sodium: 142 mmol/L (ref 134–144)
Total Protein: 7 g/dL (ref 6.0–8.5)
eGFR: 100 mL/min/{1.73_m2} (ref >59)

## 2020-09-10 LAB — LIPID PANEL
Chol/HDL Ratio: 4.7 ratio — ABNORMAL HIGH (ref 0.0–4.4)
Cholesterol, Total: 240 mg/dL — ABNORMAL HIGH (ref 100–199)
HDL: 51 mg/dL (ref >39)
LDL Chol Calc (NIH): 148 mg/dL — ABNORMAL HIGH (ref 0–99)
Triglycerides: 226 mg/dL — ABNORMAL HIGH (ref 0–149)
VLDL Cholesterol Cal: 41 mg/dL — ABNORMAL HIGH (ref 5–40)

## 2020-10-03 DIAGNOSIS — M25552 Pain in left hip: Secondary | ICD-10-CM | POA: Diagnosis not present

## 2020-10-03 DIAGNOSIS — M5416 Radiculopathy, lumbar region: Secondary | ICD-10-CM | POA: Diagnosis not present

## 2020-10-03 DIAGNOSIS — F419 Anxiety disorder, unspecified: Secondary | ICD-10-CM | POA: Diagnosis not present

## 2020-10-03 DIAGNOSIS — M069 Rheumatoid arthritis, unspecified: Secondary | ICD-10-CM | POA: Diagnosis not present

## 2020-10-03 DIAGNOSIS — G603 Idiopathic progressive neuropathy: Secondary | ICD-10-CM | POA: Diagnosis not present

## 2020-10-03 DIAGNOSIS — M545 Low back pain, unspecified: Secondary | ICD-10-CM | POA: Diagnosis not present

## 2020-10-03 DIAGNOSIS — Z79891 Long term (current) use of opiate analgesic: Secondary | ICD-10-CM | POA: Diagnosis not present

## 2020-10-17 ENCOUNTER — Other Ambulatory Visit: Payer: Self-pay | Admitting: Gastroenterology

## 2020-10-17 DIAGNOSIS — R1319 Other dysphagia: Secondary | ICD-10-CM

## 2020-10-17 DIAGNOSIS — K219 Gastro-esophageal reflux disease without esophagitis: Secondary | ICD-10-CM

## 2020-11-03 ENCOUNTER — Ambulatory Visit: Payer: Medicaid Other | Admitting: Nurse Practitioner

## 2020-11-03 ENCOUNTER — Encounter: Payer: Self-pay | Admitting: Nurse Practitioner

## 2020-11-03 ENCOUNTER — Other Ambulatory Visit: Payer: Self-pay

## 2020-11-03 VITALS — BP 138/76 | HR 86 | Temp 97.1°F | Ht 68.0 in | Wt 185.0 lb

## 2020-11-03 DIAGNOSIS — R591 Generalized enlarged lymph nodes: Secondary | ICD-10-CM | POA: Diagnosis not present

## 2020-11-03 NOTE — Patient Instructions (Signed)
Lymphadenopathy °Lymphadenopathy means that your lymph glands are swollen or larger than normal. Lymph glands, also called lymph nodes, are collections of tissue that filter excess fluid, bacteria, viruses, and waste from your bloodstream. They are part of your body's disease-fighting system (immune system), which protects your body from germs. °There may be different causes of lymphadenopathy, depending on where it is in your body. Some types go away on their own. Lymphadenopathy can occur anywhere that you have lymph glands, including these areas: °Neck (cervical lymphadenopathy). °Chest (mediastinal lymphadenopathy). °Lungs (hilar lymphadenopathy). °Underarms (axillary lymphadenopathy). °Groin (inguinal lymphadenopathy). °When your immune system responds to germs, infection-fighting cells and fluid build up in your lymph glands. This causes some swelling and enlargement. If the lymph nodes do not go back to normal size after you have an infection or disease, your health care provider may do tests. These tests help to monitor your condition and find the reason why the glands are still swollen and enlarged. °Follow these instructions at home: ° °Get plenty of rest. °Your health care provider may recommend over-the-counter medicines for pain. Take over-the-counter and prescription medicines only as told by your health care provider. °If directed, apply heat to swollen lymph glands as often as told by your health care provider. Use the heat source that your health care provider recommends, such as a moist heat pack or a heating pad. °Place a towel between your skin and the heat source. °Leave the heat on for 20-30 minutes. °Remove the heat if your skin turns bright red. This is especially important if you are unable to feel pain, heat, or cold. You may have a greater risk of getting burned. °Check your affected lymph glands every day for changes. Check other lymph gland areas as told by your health care provider.  Check for changes such as: °More swelling. °Sudden increase in size. °Redness or pain. °Hardness. °Keep all follow-up visits. This is important. °Contact a health care provider if you have: °Lymph glands that: °Are still swollen after 2 weeks. °Have suddenly gotten bigger or the swelling spreads. °Are red, painful, or hard. °Fluid leaking from the skin near an enlarged lymph gland. °Problems with breathing. °A fever, chills, or night sweats. °Fatigue. °A sore throat. °Pain in your abdomen. °Weight loss. °Get help right away if you have: °Severe pain. °Chest pain. °Shortness of breath. °These symptoms may represent a serious problem that is an emergency. Do not wait to see if the symptoms will go away. Get medical help right away. Call your local emergency services (911 in the [place] Do not drive yourself to the hospital. °Summary °Lymphadenopathy means that your lymph glands are swollen or larger than normal. °Lymph glands, also called lymph nodes, are collections of tissue that filter excess fluid, bacteria, viruses, and waste from the bloodstream. They are part of your body's disease-fighting system (immune system). °Lymphadenopathy can occur anywhere that you have lymph glands. °If the lymph nodes do not go back to normal size after you have an infection or disease, your health care provider may do tests to monitor your condition and find the reason why the glands are still swollen and enlarged. °Check your affected lymph glands every day for changes. Check other lymph gland areas as told by your health care provider. °This information is not intended to replace advice given to you by your health care provider. Make sure you discuss any questions you have with your health care provider. °Document Revised: 02/24/2020 Document Reviewed: 02/24/2020 °Elsevier Patient Education © 2022   [employer] ° °

## 2020-11-03 NOTE — Progress Notes (Signed)
Acute Office Visit  Subjective:    Patient ID: Kaitlin Fleming, female    DOB: 03/22/1961, 60 y.o.   MRN: [id number]  Chief Complaint  Patient presents with   Lymphadenopathy    HPI Patient is a 60 year old who is in clinic for swollen lymph nodes on left lateral neck.  Symptoms present in the last 1 week.  No fever, nausea or vomiting, flulike symptoms associated with symptoms. Patient is concerned because of current medication she believes is high risk for lymphoma.   Past Medical History:  Diagnosis Date   Chronic back pain    stenosis   Chronic pain    takes Lexapro daily   Complication of anesthesia    pt states after surgery in 2000 had to wear a heart monitor for 3 days.   DDD (degenerative disc disease)    Depression    GERD (gastroesophageal reflux disease)    takes OTC meds if needed   History of blood transfusion    as a child   History of bronchitis 60yr ago   Hyperlipidemia    takes Fenofibrate daily   Joint pain    Joint swelling    Kidney stone    has one right now on the right side but not giving any problems   Osteoarthritis    Peripheral edema    takes HCTZ daily    Pneumonia 89yrago   hx of   PONV (postoperative nausea and vomiting)    Rheumatoid arthritis (HCC)    Rheumatoid arthritis (HCC)    Weakness    and tingling on left side    Past Surgical History:  Procedure Laterality Date   ABDOMINAL HYSTERECTOMY  2000   BACK SURGERY  03/10/14   lumbar fusion   LUMBAR LAMINECTOMY/DECOMPRESSION MICRODISCECTOMY Left 12/08/2013   Procedure: LEFT LUMBAR FOUR-FIVEW, LUMBAR FIVE-SACRAL ONE LAMINECTOMY;  Surgeon: ErFloyce StakesMD;  Location: [facility];  Service: Neurosurgery;  Laterality: Left;   TUBAL LIGATION  24 yrs ago    Family History  Problem Relation Age of Onset   Colon cancer Neg Hx     Social History   Socioeconomic History   Marital status: Married    Spouse name: Not on file   Number of children: Not on file   Years  of education: Not on file   Highest education level: Not on file  Occupational History   Not on file  Tobacco Use   Smoking status: Never   Smokeless tobacco: Never  Vaping Use   Vaping Use: Never used  Substance and Sexual Activity   Alcohol use: No   Drug use: No   Sexual activity: Yes    Birth control/protection: Surgical  Other Topics Concern   Not on file  Social History Narrative   Not on file   Social Determinants of Health   Financial Resource Strain: Not on file  Food Insecurity: Not on file  Transportation Needs: Not on file  Physical Activity: Not on file  Stress: Not on file  Social Connections: Not on file  Intimate Partner Violence: Not on file    Outpatient Medications Prior to Visit  Medication Sig Dispense Refill   acetaminophen (TYLENOL) 500 MG tablet Take 1 tablet (500 mg total) by mouth every 6 (six) hours as needed. 30 tablet 0   esomeprazole (NEXIUM) 40 MG capsule TAKE 1 CAPSULE TWICE DAILY BEFORE A MEAL 60 capsule 3   ezetimibe (ZETIA) 10 MG tablet Take 1 tablet (  10 mg total) by mouth daily. 90 tablet 1   fluticasone (FLONASE) 50 MCG/ACT nasal spray Place 2 sprays into both nostrils daily. 16 g 6   hydrochlorothiazide (HYDRODIURIL) 25 MG tablet Take 1 tablet (25 mg total) by mouth daily. 90 tablet 3   loratadine (CLARITIN) 10 MG tablet Take 1 tablet (10 mg total) by mouth at bedtime. 30 tablet 1   ondansetron (ZOFRAN ODT) 4 MG disintegrating tablet 27m ODT q4 hours prn nausea/vomit 12 tablet 0   oxyCODONE-acetaminophen (PERCOCET) 10-325 MG tablet Take 1 tablet by mouth every 6 (six) hours as needed for pain.      predniSONE (DELTASONE) 5 MG tablet Take 1 tablet (5 mg total) by mouth daily. 30 tablet 1   sertraline (ZOLOFT) 50 MG tablet Take 1 tablet (50 mg total) by mouth daily. 90 tablet 3   No facility-administered medications prior to visit.    Allergies  Allergen Reactions   Macrobid [Nitrofurantoin] Shortness Of Breath and Rash    Ciprofloxacin Nausea And Vomiting    Sick on stomach Sick on stomach   Lotensin [Benazepril Hcl]     Face turns red swelling of face   Plaquenil [Hydroxychloroquine Sulfate] Nausea And Vomiting and Other (See Comments)    GI UPSET   Macrolides And Ketolides    Methotrexate    Remicade [Infliximab]    Tofacitinib Other (See Comments)    Upper respiratory infections   Atorvastatin Other (See Comments)    Joint pain.    Doxycycline Other (See Comments)    blisters   Ibuprofen Hives   Penicillins Other (See Comments)    Breaks out Has patient had a PCN reaction causing immediate rash, facial/tongue/throat swelling, SOB or lightheadedness with hypotension: yes Has patient had a PCN reaction causing severe rash involving mucus membranes or skin necrosis: no Has patient had a PCN reaction that required hospitalization: no Has patient had a PCN reaction occurring within the last 10 years:no If all of the above answers are "NO", then may proceed with Cephalosporin use.   Pravastatin Other (See Comments)    Rash & heart racing.   Sulfa Antibiotics Other (See Comments)    Breaks out    Review of Systems  Constitutional: Negative.   HENT: Negative.    Eyes: Negative.   Respiratory: Negative.    Gastrointestinal: Negative.   Genitourinary: Negative.   Musculoskeletal: Negative.   Skin: Negative.   All other systems reviewed and are negative.     Objective:    Physical Exam Vitals and nursing note reviewed.  Constitutional:      Appearance: Normal appearance.  HENT:     Head: Normocephalic.     Nose: Nose normal.     Mouth/Throat:     Mouth: Mucous membranes are moist.     Palate: No mass.  Eyes:     Conjunctiva/sclera: Conjunctivae normal.  Neck:      Comments: Swollen lymph nodes left lateral neck Cardiovascular:     Rate and Rhythm: Normal rate and regular rhythm.     Pulses: Normal pulses.     Heart sounds: Normal heart sounds.  Pulmonary:     Effort: Pulmonary  effort is normal.     Breath sounds: Normal breath sounds.  Abdominal:     General: Abdomen is flat.  Neurological:     General: No focal deficit present.     Mental Status: She is alert.    BP 138/76   Pulse 86   Temp (!)  97.1 F (36.2 C) (Temporal)   Ht '5\' 8"'  (1.727 m)   Wt 185 lb (83.9 kg)   SpO2 97%   BMI 28.13 kg/m  Wt Readings from Last 3 Encounters:  11/03/20 185 lb (83.9 kg)  09/09/20 181 lb (82.1 kg)  06/11/20 186 lb 6.4 oz (84.5 kg)    Health Maintenance Due  Topic Date Due   Pneumococcal Vaccine 85-27 Years old (1 - PCV) Never done    There are no preventive care reminders to display for this patient.   Lab Results  Component Value Date   TSH 2.870 06/04/2019   Lab Results  Component Value Date   WBC 7.7 06/11/2020   HGB 12.9 06/11/2020   HCT 40.4 06/11/2020   MCV 85.1 06/11/2020   PLT 252 06/11/2020   Lab Results  Component Value Date   NA 142 09/09/2020   K 4.0 09/09/2020   CO2 27 09/09/2020   GLUCOSE 87 09/09/2020   BUN 9 09/09/2020   CREATININE 0.66 09/09/2020   BILITOT 0.2 09/09/2020   ALKPHOS 113 09/09/2020   AST 12 09/09/2020   ALT 13 09/09/2020   PROT 7.0 09/09/2020   ALBUMIN 3.8 09/09/2020   CALCIUM 8.9 09/09/2020   ANIONGAP 11 06/11/2020   EGFR 100 09/09/2020   Lab Results  Component Value Date   CHOL 240 (H) 09/09/2020   Lab Results  Component Value Date   HDL 51 09/09/2020   Lab Results  Component Value Date   LDLCALC 148 (H) 09/09/2020   Lab Results  Component Value Date   TRIG 226 (H) 09/09/2020   Lab Results  Component Value Date   CHOLHDL 4.7 (H) 09/09/2020   No results found for: HGBA1C     Assessment & Plan:   Problem List Items Addressed This Visit       Immune and Lymphatic   Lymphadenopathy of head and neck - Primary    Ultrasound of neck completed results pending.  Advised patient to watch and wait if symptoms not resolved please return. Completed CBC and CMP to rule out elevated white counts  for infection.        Relevant Orders   CBC with Differential   Comprehensive metabolic panel   Thyroid Panel With TSH   US Soft Tissue Head/Neck (NON-THYROID)     No orders of the defined types were placed in this encounter.    Ivy Lynn, NP

## 2020-11-03 NOTE — Assessment & Plan Note (Signed)
Ultrasound of neck completed results pending.  Advised patient to watch and wait if symptoms not resolved please return. Completed CBC and CMP to rule out elevated white counts for infection.

## 2020-11-04 LAB — CBC WITH DIFFERENTIAL/PLATELET
Basophils Absolute: 0 10*3/uL (ref 0.0–0.2)
Basos: 0 %
EOS (ABSOLUTE): 0.1 10*3/uL (ref 0.0–0.4)
Eos: 1 %
Hematocrit: 40.5 % (ref 34.0–46.6)
Hemoglobin: 12.8 g/dL (ref 11.1–15.9)
Immature Grans (Abs): 0 10*3/uL (ref 0.0–0.1)
Immature Granulocytes: 0 %
Lymphocytes Absolute: 1 10*3/uL (ref 0.7–3.1)
Lymphs: 17 %
MCH: 26.4 pg — ABNORMAL LOW (ref 26.6–33.0)
MCHC: 31.6 g/dL (ref 31.5–35.7)
MCV: 84 fL (ref 79–97)
Monocytes Absolute: 0.5 10*3/uL (ref 0.1–0.9)
Monocytes: 9 %
Neutrophils Absolute: 4.4 10*3/uL (ref 1.4–7.0)
Neutrophils: 73 %
Platelets: 271 10*3/uL (ref 150–450)
RBC: 4.84 x10E6/uL (ref 3.77–5.28)
RDW: 15.3 % (ref 11.7–15.4)
WBC: 6 10*3/uL (ref 3.4–10.8)

## 2020-11-04 LAB — COMPREHENSIVE METABOLIC PANEL
ALT: 15 IU/L (ref 0–32)
AST: 13 IU/L (ref 0–40)
Albumin/Globulin Ratio: 1.5 (ref 1.2–2.2)
Albumin: 4.3 g/dL (ref 3.8–4.9)
Alkaline Phosphatase: 126 IU/L — ABNORMAL HIGH (ref 44–121)
BUN/Creatinine Ratio: 9 — ABNORMAL LOW (ref 12–28)
BUN: 6 mg/dL — ABNORMAL LOW (ref 8–27)
Bilirubin Total: <0.2 mg/dL (ref 0.0–1.2)
CO2: 27 mmol/L (ref 20–29)
Calcium: 9.4 mg/dL (ref 8.7–10.3)
Chloride: 101 mmol/L (ref 96–106)
Creatinine, Ser: 0.64 mg/dL (ref 0.57–1.00)
Globulin, Total: 2.9 g/dL (ref 1.5–4.5)
Glucose: 95 mg/dL (ref 65–99)
Potassium: 4.5 mmol/L (ref 3.5–5.2)
Sodium: 142 mmol/L (ref 134–144)
Total Protein: 7.2 g/dL (ref 6.0–8.5)
eGFR: 101 mL/min/{1.73_m2} (ref >59)

## 2020-11-04 LAB — THYROID PANEL WITH TSH
Free Thyroxine Index: 3.3 (ref 1.2–4.9)
T3 Uptake Ratio: 42 % — ABNORMAL HIGH (ref 24–39)
T4, Total: 7.8 ug/dL (ref 4.5–12.0)
TSH: 1.77 u[IU]/mL (ref 0.450–4.500)

## 2020-11-07 NOTE — Progress Notes (Signed)
Pt aware.

## 2020-11-07 NOTE — Progress Notes (Signed)
Pt returning call, call back at [phone]

## 2020-11-10 ENCOUNTER — Other Ambulatory Visit: Payer: Self-pay

## 2020-11-10 ENCOUNTER — Ambulatory Visit (HOSPITAL_COMMUNITY)
Admission: RE | Admit: 2020-11-10 | Discharge: 2020-11-10 | Disposition: A | Discharge status: Home / Self Care | Payer: Medicaid Other | Source: Ambulatory Visit | Attending: Nurse Practitioner | Admitting: Nurse Practitioner

## 2020-11-10 DIAGNOSIS — R591 Generalized enlarged lymph nodes: Secondary | ICD-10-CM | POA: Insufficient documentation

## 2020-11-10 DIAGNOSIS — R590 Localized enlarged lymph nodes: Secondary | ICD-10-CM | POA: Diagnosis not present

## 2020-12-09 ENCOUNTER — Ambulatory Visit: Payer: Medicaid Other | Admitting: Nurse Practitioner

## 2020-12-16 DIAGNOSIS — L258 Unspecified contact dermatitis due to other agents: Secondary | ICD-10-CM | POA: Diagnosis not present

## 2020-12-16 DIAGNOSIS — W57XXXA Bitten or stung by nonvenomous insect and other nonvenomous arthropods, initial encounter: Secondary | ICD-10-CM | POA: Diagnosis not present

## 2020-12-16 DIAGNOSIS — S30861A Insect bite (nonvenomous) of abdominal wall, initial encounter: Secondary | ICD-10-CM | POA: Diagnosis not present

## 2020-12-19 ENCOUNTER — Ambulatory Visit: Payer: Medicaid Other | Admitting: Nurse Practitioner

## 2020-12-23 DIAGNOSIS — F419 Anxiety disorder, unspecified: Secondary | ICD-10-CM | POA: Diagnosis not present

## 2020-12-23 DIAGNOSIS — M069 Rheumatoid arthritis, unspecified: Secondary | ICD-10-CM | POA: Diagnosis not present

## 2020-12-23 DIAGNOSIS — M545 Low back pain, unspecified: Secondary | ICD-10-CM | POA: Diagnosis not present

## 2020-12-23 DIAGNOSIS — I10 Essential (primary) hypertension: Secondary | ICD-10-CM | POA: Diagnosis not present

## 2020-12-23 DIAGNOSIS — Z79891 Long term (current) use of opiate analgesic: Secondary | ICD-10-CM | POA: Diagnosis not present

## 2020-12-23 DIAGNOSIS — G603 Idiopathic progressive neuropathy: Secondary | ICD-10-CM | POA: Diagnosis not present

## 2020-12-23 DIAGNOSIS — M5416 Radiculopathy, lumbar region: Secondary | ICD-10-CM | POA: Diagnosis not present

## 2020-12-23 DIAGNOSIS — M25552 Pain in left hip: Secondary | ICD-10-CM | POA: Diagnosis not present

## 2020-12-26 DIAGNOSIS — M0579 Rheumatoid arthritis with rheumatoid factor of multiple sites without organ or systems involvement: Secondary | ICD-10-CM | POA: Diagnosis not present

## 2020-12-26 DIAGNOSIS — M545 Low back pain, unspecified: Secondary | ICD-10-CM | POA: Diagnosis not present

## 2020-12-26 DIAGNOSIS — M810 Age-related osteoporosis without current pathological fracture: Secondary | ICD-10-CM | POA: Diagnosis not present

## 2020-12-26 DIAGNOSIS — G8929 Other chronic pain: Secondary | ICD-10-CM | POA: Diagnosis not present

## 2020-12-30 ENCOUNTER — Ambulatory Visit: Payer: Medicaid Other | Admitting: Nurse Practitioner

## 2020-12-30 ENCOUNTER — Encounter: Payer: Self-pay | Admitting: Nurse Practitioner

## 2020-12-30 ENCOUNTER — Other Ambulatory Visit: Payer: Self-pay | Admitting: *Deleted

## 2020-12-30 ENCOUNTER — Other Ambulatory Visit: Payer: Self-pay

## 2020-12-30 VITALS — BP 147/79 | HR 86 | Temp 97.6°F | Ht 68.0 in | Wt 182.0 lb

## 2020-12-30 DIAGNOSIS — K2100 Gastro-esophageal reflux disease with esophagitis, without bleeding: Secondary | ICD-10-CM | POA: Diagnosis not present

## 2020-12-30 DIAGNOSIS — F324 Major depressive disorder, single episode, in partial remission: Secondary | ICD-10-CM

## 2020-12-30 DIAGNOSIS — E782 Mixed hyperlipidemia: Secondary | ICD-10-CM | POA: Diagnosis not present

## 2020-12-30 DIAGNOSIS — R5383 Other fatigue: Secondary | ICD-10-CM | POA: Diagnosis not present

## 2020-12-30 LAB — COMPREHENSIVE METABOLIC PANEL
ALT: 10 IU/L (ref 0–32)
AST: 13 IU/L (ref 0–40)
Albumin/Globulin Ratio: 1.6 (ref 1.2–2.2)
Albumin: 4.2 g/dL (ref 3.8–4.9)
Alkaline Phosphatase: 117 IU/L (ref 44–121)
BUN/Creatinine Ratio: 9 — ABNORMAL LOW (ref 12–28)
BUN: 6 mg/dL — ABNORMAL LOW (ref 8–27)
Bilirubin Total: 0.3 mg/dL (ref 0.0–1.2)
CO2: 29 mmol/L (ref 20–29)
Calcium: 9.2 mg/dL (ref 8.7–10.3)
Chloride: 98 mmol/L (ref 96–106)
Creatinine, Ser: 0.66 mg/dL (ref 0.57–1.00)
Globulin, Total: 2.7 g/dL (ref 1.5–4.5)
Glucose: 67 mg/dL (ref 65–99)
Potassium: 3.8 mmol/L (ref 3.5–5.2)
Sodium: 143 mmol/L (ref 134–144)
Total Protein: 6.9 g/dL (ref 6.0–8.5)
eGFR: 100 mL/min/{1.73_m2} (ref >59)

## 2020-12-30 LAB — CBC WITH DIFFERENTIAL/PLATELET
Basophils Absolute: 0 10*3/uL (ref 0.0–0.2)
Basos: 0 %
EOS (ABSOLUTE): 0.1 10*3/uL (ref 0.0–0.4)
Eos: 2 %
Hematocrit: 39.3 % (ref 34.0–46.6)
Hemoglobin: 12.8 g/dL (ref 11.1–15.9)
Immature Grans (Abs): 0 10*3/uL (ref 0.0–0.1)
Immature Granulocytes: 0 %
Lymphocytes Absolute: 1.1 10*3/uL (ref 0.7–3.1)
Lymphs: 19 %
MCH: 27.6 pg (ref 26.6–33.0)
MCHC: 32.6 g/dL (ref 31.5–35.7)
MCV: 85 fL (ref 79–97)
Monocytes Absolute: 0.5 10*3/uL (ref 0.1–0.9)
Monocytes: 9 %
Neutrophils Absolute: 3.9 10*3/uL (ref 1.4–7.0)
Neutrophils: 70 %
Platelets: 211 10*3/uL (ref 150–450)
RBC: 4.63 x10E6/uL (ref 3.77–5.28)
RDW: 13.7 % (ref 11.7–15.4)
WBC: 5.6 10*3/uL (ref 3.4–10.8)

## 2020-12-30 LAB — LIPID PANEL
Chol/HDL Ratio: 4.9 ratio — ABNORMAL HIGH (ref 0.0–4.4)
Cholesterol, Total: 248 mg/dL — ABNORMAL HIGH (ref 100–199)
HDL: 51 mg/dL (ref >39)
LDL Chol Calc (NIH): 160 mg/dL — ABNORMAL HIGH (ref 0–99)
Triglycerides: 204 mg/dL — ABNORMAL HIGH (ref 0–149)
VLDL Cholesterol Cal: 37 mg/dL (ref 5–40)

## 2020-12-30 LAB — TSH: TSH: 2.03 u[IU]/mL (ref 0.450–4.500)

## 2020-12-30 MED ORDER — HYDROCHLOROTHIAZIDE 12.5 MG PO TABS: 6.2500 mg | ORAL_TABLET | Freq: Every day | ORAL | 0 refills | Status: DC

## 2020-12-30 NOTE — Assessment & Plan Note (Signed)
Symptoms well controlled on current medication dose.  Continue healthy diet, and exercise as tolerated.

## 2020-12-30 NOTE — Progress Notes (Signed)
Established Patient Office Visit  Subjective:  Patient ID: Kaitlin Fleming, female    DOB: 14-Nov-1960  Age: 60 y.o. MRN: [id number]  CC:  Chief Complaint  Patient presents with   Medical Management of Chronic Issues    HPI Kaitlin Fleming    Mixed hyperlipidemia   Patient presents with hyperlipidemia. Patient was diagnosed in 10/22 Compliance with treatment has been poor.; The patient is not compliant with medications due to side effect from medication, she tries to maintain a low cholesterol diet , follows up as directed , and maintains an exercise regimen . The patient denies experiencing any hypercholesterolemia related symptoms.      GERD, Follow up:  The patient was last seen for GERD 6 months ago. Changes made since that visit include none.  She reports good compliance with treatment. She is not having side effects. .  She IS experiencing no new symptoms She is NOT experiencing abdominal bloating, bilious reflux, choking on food, deep pressure at base of neck, or difficulty swallowing   Flowsheet Row Office Visit from 09/09/2020 in [facility]  PHQ-9 Total Score 0       GAD 7 : Generalized Anxiety Score 06/09/2020  Nervous, Anxious, on Edge 0  Control/stop worrying 2  Worry too much - different things 2  Trouble relaxing 1  Restless 0  Easily annoyed or irritable 1  Afraid - awful might happen 0  Total GAD 7 Score 6  Anxiety Difficulty Very difficult    Fatigue  She reports recurrent fatigue which she describes as a lack of energy, feeling exhausted, and feeling weak. It began several months ago and occurs all the time. It is described as marked and worsening. She has not started new medications around the time the fatigue started.   Associated symptoms: Yes arthralgias No bleeding  No melena No chest discomfort  No heart palpitations No heart racing   No dyspnea  Feeling depressed  No feeling anxious or under stress No fevers   No loss of appetite No nausea  No vomiting Yes sleeping problems    Wt Readings from Last 3 Encounters:  12/30/20 182 lb (82.6 kg)  11/03/20 185 lb (83.9 kg)  09/09/20 181 lb (82.1 kg)    Lab Results  Component Value Date   WBC 6.0 11/03/2020   HGB 12.8 11/03/2020   HCT 40.5 11/03/2020   MCV 84 11/03/2020   PLT 271 11/03/2020   Lab Results  Component Value Date   TSH 1.770 11/03/2020   Lab Results  Component Value Date   NA 142 11/03/2020   K 4.5 11/03/2020   CO2 27 11/03/2020   BUN 6 (L) 11/03/2020   CREATININE 0.64 11/03/2020   CALCIUM 9.4 11/03/2020   GLUCOSE 95 11/03/2020     ---------------------------------------------------------------------------------------------------   Past Medical History:  Diagnosis Date   Chronic back pain    stenosis   Chronic pain    takes Lexapro daily   Complication of anesthesia    pt states after surgery in 2000 had to wear a heart monitor for 3 days.   DDD (degenerative disc disease)    Depression    GERD (gastroesophageal reflux disease)    takes OTC meds if needed   History of blood transfusion    as a child   History of bronchitis 73yr ago   Hyperlipidemia    takes Fenofibrate daily   Joint pain    Joint swelling    Kidney stone  has one right now on the right side but not giving any problems   Osteoarthritis    Peripheral edema    takes HCTZ daily    Pneumonia 21yr ago   hx of   PONV (postoperative nausea and vomiting)    Rheumatoid arthritis (HCC)    Rheumatoid arthritis (HCC)    Weakness    and tingling on left side    Past Surgical History:  Procedure Laterality Date   ABDOMINAL HYSTERECTOMY  2000   BACK SURGERY  03/10/14   lumbar fusion   LUMBAR LAMINECTOMY/DECOMPRESSION MICRODISCECTOMY Left 12/08/2013   Procedure: LEFT LUMBAR FOUR-FIVEW, LUMBAR FIVE-SACRAL ONE LAMINECTOMY;  Surgeon: EFloyce Stakes MD;  [facility];  Service: Neurosurgery;  Laterality: Left;   TUBAL LIGATION  24  yrs ago    Family History  Problem Relation Age of Onset   Colon cancer Neg Hx     Social History   Socioeconomic History   Marital status: Married    Spouse name: Not on file   Number of children: Not on file   Years of education: Not on file   Highest education level: Not on file  Occupational History   Not on file  Tobacco Use   Smoking status: Never   Smokeless tobacco: Never  Vaping Use   Vaping Use: Never used  Substance and Sexual Activity   Alcohol use: No   Drug use: No   Sexual activity: Yes    Birth control/protection: Surgical  Other Topics Concern   Not on file  Social History Narrative   Not on file   Social Determinants of Health   Financial Resource Strain: Not on file  Food Insecurity: Not on file  Transportation Needs: Not on file  Physical Activity: Not on file  Stress: Not on file  Social Connections: Not on file  Intimate Partner Violence: Not on file    Outpatient Medications Prior to Visit  Medication Sig Dispense Refill   acetaminophen (TYLENOL) 500 MG tablet Take 1 tablet (500 mg total) by mouth every 6 (six) hours as needed. 30 tablet 0   esomeprazole (NEXIUM) 40 MG capsule TAKE 1 CAPSULE TWICE DAILY BEFORE A MEAL 60 capsule 3   ezetimibe (ZETIA) 10 MG tablet Take 1 tablet (10 mg total) by mouth daily. 90 tablet 1   fluticasone (FLONASE) 50 MCG/ACT nasal spray Place 2 sprays into both nostrils daily. 16 g 6   loratadine (CLARITIN) 10 MG tablet Take 1 tablet (10 mg total) by mouth at bedtime. 30 tablet 1   ondansetron (ZOFRAN ODT) 4 MG disintegrating tablet 450mODT q4 hours prn nausea/vomit 12 tablet 0   oxyCODONE-acetaminophen (PERCOCET) 10-325 MG tablet Take 1 tablet by mouth every 6 (six) hours as needed for pain.      predniSONE (DELTASONE) 5 MG tablet Take 1 tablet (5 mg total) by mouth daily. 30 tablet 1   sertraline (ZOLOFT) 50 MG tablet Take 1 tablet (50 mg total) by mouth daily. 90 tablet 3   hydrochlorothiazide (HYDRODIURIL) 25  MG tablet Take 1 tablet (25 mg total) by mouth daily. 90 tablet 3   No facility-administered medications prior to visit.    Allergies  Allergen Reactions   Macrobid [Nitrofurantoin] Shortness Of Breath and Rash   Ciprofloxacin Nausea And Vomiting    Sick on stomach Sick on stomach   Lotensin [Benazepril Hcl]     Face turns red swelling of face   Plaquenil [Hydroxychloroquine Sulfate] Nausea And Vomiting and Other (  See Comments)    GI UPSET   Macrolides And Ketolides    Methotrexate    Remicade [Infliximab]    Tofacitinib Other (See Comments)    Upper respiratory infections   Atorvastatin Other (See Comments)    Joint pain.    Doxycycline Other (See Comments)    blisters   Ibuprofen Hives   Penicillins Other (See Comments)    Breaks out Has patient had a PCN reaction causing immediate rash, facial/tongue/throat swelling, SOB or lightheadedness with hypotension: yes Has patient had a PCN reaction causing severe rash involving mucus membranes or skin necrosis: no Has patient had a PCN reaction that required hospitalization: no Has patient had a PCN reaction occurring within the last 10 years:no If all of the above answers are "NO", then may proceed with Cephalosporin use.   Pravastatin Other (See Comments)    Rash & heart racing.   Sulfa Antibiotics Other (See Comments)    Breaks out    ROS Review of Systems  Constitutional:  Positive for fatigue.  HENT: Negative.    Eyes: Negative.   Respiratory: Negative.    Gastrointestinal: Negative.   Musculoskeletal: Negative.   Skin: Negative.  Negative for rash.  All other systems reviewed and are negative.    Objective:    Physical Exam Vitals and nursing note reviewed.  Constitutional:      Appearance: Normal appearance.  HENT:     Head: Normocephalic.  Cardiovascular:     Rate and Rhythm: Normal rate and regular rhythm.     Pulses: Normal pulses.     Heart sounds: Normal heart sounds.  Pulmonary:     Effort:  Pulmonary effort is normal.     Breath sounds: Normal breath sounds.  Abdominal:     General: Bowel sounds are normal.  Musculoskeletal:        General: Normal range of motion.  Skin:    General: Skin is dry.  Neurological:     Mental Status: She is alert and oriented to person, place, and time.  Psychiatric:        Behavior: Behavior normal.    BP (!) 147/79   Pulse 86   Temp 97.6 F (36.4 C) (Temporal)   Ht _0  (1.727 m)   Wt 182 lb (82.6 kg)   SpO2 96%   BMI 27.67 kg/m  Wt Readings from Last 3 Encounters:  12/30/20 182 lb (82.6 kg)  11/03/20 185 lb (83.9 kg)  09/09/20 181 lb (82.1 kg)     Health Maintenance Due  Topic Date Due   Pneumococcal Vaccine 78-34 Years old (1 - PCV) Never done   INFLUENZA VACCINE  12/12/2020    There are no preventive care reminders to display for this patient.  Lab Results  Component Value Date   TSH 1.770 11/03/2020   Lab Results  Component Value Date   WBC 6.0 11/03/2020   HGB 12.8 11/03/2020   HCT 40.5 11/03/2020   MCV 84 11/03/2020   PLT 271 11/03/2020   Lab Results  Component Value Date   NA 142 11/03/2020   K 4.5 11/03/2020   CO2 27 11/03/2020   GLUCOSE 95 11/03/2020   BUN 6 (L) 11/03/2020   CREATININE 0.64 11/03/2020   BILITOT <0.2 11/03/2020   ALKPHOS 126 (H) 11/03/2020   AST 13 11/03/2020   ALT 15 11/03/2020   PROT 7.2 11/03/2020   ALBUMIN 4.3 11/03/2020   CALCIUM 9.4 11/03/2020   ANIONGAP 11 06/11/2020   EGFR 101  11/03/2020   Lab Results  Component Value Date   CHOL 240 (H) 09/09/2020   Lab Results  Component Value Date   HDL 51 09/09/2020   Lab Results  Component Value Date   LDLCALC 148 (H) 09/09/2020   Lab Results  Component Value Date   TRIG 226 (H) 09/09/2020   Lab Results  Component Value Date   CHOLHDL 4.7 (H) 09/09/2020   No results found for: HGBA1C    Assessment & Plan:   Problem List Items Addressed This Visit       Digestive   GERD (gastroesophageal reflux disease)     Symptoms well controlled on current medication dose.  Continue healthy diet, and exercise as tolerated.      Relevant Orders   CBC with Differential   Comprehensive metabolic panel     Other   Elevated triglycerides with high cholesterol - Primary    Repeated lipid panels, patient is unable to take any statin due to muscle ache and history of rheumatoid arthritis.  Patient also reports that her hydrochlorothiazide was decreased from 25 mg to 6.25 milligrams due to increased dehydration by her cardiologist.  She reports worsening pain with any type of statin and will continue to maintain cholesterol level with diet and exercise.  Repeat lipid panel results pending.      Relevant Medications   hydrochlorothiazide (HYDRODIURIL) 12.5 MG tablet   Other Relevant Orders   Lipid Panel   Depression, major, single episode, in partial remission ([place])    Completed PHQ-9, patient is doing well, symptoms well controlled on Zoloft 50 mg tablet by mouth daily.  Education provided to patient printed handouts given.      Fatigue    Patient is experiencing new signs and symptoms of fatigue in the past few weeks.  Patient reports having a new grandchild that has kept her up most nights.  No fever nausea, chills or any other symptoms associated.  Patient has not made changes to diet either.  Completed CBC and CMP results pending.      Relevant Orders   TSH    Meds ordered this encounter  Medications   hydrochlorothiazide (HYDRODIURIL) 12.5 MG tablet    Sig: Take 0.5 tablets (6.25 mg total) by mouth daily.    Dispense:  60 tablet    Refill:  0    Order Specific Question:   Supervising Provider    Answer:   Janora Norlander [[id number]]    Follow-up: Return in about 3 months (around 04/01/2021).    Ivy Lynn, NP

## 2020-12-30 NOTE — Assessment & Plan Note (Addendum)
Repeated lipid panels, patient is unable to take any statin due to muscle ache and history of rheumatoid arthritis.  Patient also reports that her hydrochlorothiazide was decreased from 25 mg to 6.25 milligrams due to increased dehydration by her cardiologist.  She reports worsening pain with any type of statin and will continue to maintain cholesterol level with diet and exercise.  Repeat lipid panel results pending.

## 2020-12-30 NOTE — Patient Instructions (Addendum)
High Cholesterol  High cholesterol is a condition in which the blood has high levels of a white, waxy substance similar to fat (cholesterol). The liver makes all the cholesterol that the body needs. The human body needs small amounts of cholesterol to help build cells. A person gets extra orexcess cholesterol from the food that he or she eats. The blood carries cholesterol from the liver to the rest of the body. If you have high cholesterol, deposits (plaques) may build up on the walls of your arteries. Arteries are the blood vessels that carry blood away from your heart. These plaques make the arteries narrowand stiff. Cholesterol plaques increase your risk for heart attack and stroke. Work withyour health care provider to keep your cholesterol levels in a healthy range. What increases the risk? The following factors may make you more likely to develop this condition: Eating foods that are high in animal fat (saturated fat) or cholesterol. Being overweight. Not getting enough exercise. A family history of high cholesterol (familial hypercholesterolemia). Use of tobacco products. Having diabetes. What are the signs or symptoms? There are no symptoms of this condition. How is this diagnosed? This condition may be diagnosed based on the results of a blood test. If you are older than 61 years of age, your health care provider may check your cholesterol levels every 4-6 years. You may be checked more often if you have high cholesterol or other risk factors for heart disease. The blood test for cholesterol measures: "Bad" cholesterol, or LDL cholesterol. This is the main type of cholesterol that causes heart disease. The desired level is less than 100 mg/dL. "Good" cholesterol, or HDL cholesterol. HDL helps protect against heart disease by cleaning the arteries and carrying the LDL to the liver for processing. The desired level for HDL is 60 mg/dL or higher. Triglycerides. These are fats that your  body can store or burn for energy. The desired level is less than 150 mg/dL. Total cholesterol. This measures the total amount of cholesterol in your blood and includes LDL, HDL, and triglycerides. The desired level is less than 200 mg/dL. How is this treated? This condition may be treated with: Diet changes. You may be asked to eat foods that have more fiber and less saturated fats or added sugar. Lifestyle changes. These may include regular exercise, maintaining a healthy weight, and quitting use of tobacco products. Medicines. These are given when diet and lifestyle changes have not worked. You may be prescribed a statin medicine to help lower your cholesterol levels. Follow these instructions at home: Eating and drinking  Eat a healthy, balanced diet. This diet includes: Daily servings of a variety of fresh, frozen, or canned fruits and vegetables. Daily servings of whole grain foods that are rich in fiber. Foods that are low in saturated fats and trans fats. These include poultry and fish without skin, lean cuts of meat, and low-fat dairy products. A variety of fish, especially oily fish that contain omega-3 fatty acids. Aim to eat fish at least 2 times a week. Avoid foods and drinks that have added sugar. Use healthy cooking methods, such as roasting, grilling, broiling, baking, poaching, steaming, and stir-frying. Do not fry your food except for stir-frying.  Lifestyle  Get regular exercise. Aim to exercise for a total of 150 minutes a week. Increase your activity level by doing activities such as gardening, walking, and taking the stairs. Do not use any products that contain nicotine or tobacco, such as cigarettes, e-cigarettes, and chewing tobacco.  If you need help quitting, ask your health care provider.  General instructions Take over-the-counter and prescription medicines only as told by your health care provider. Keep all follow-up visits as told by your health care provider.  This is important. Where to find more information American Heart Association: www.heart.org National Heart, Lung, and Blood Institute: https://wilson-eaton.com/ Contact a health care provider if: You have trouble achieving or maintaining a healthy diet or weight. You are starting an exercise program. You are unable to stop smoking. Get help right away if: You have chest pain. You have trouble breathing. You have any symptoms of a stroke. "BE FAST" is an easy way to remember the main warning signs of a stroke: B - Balance. Signs are dizziness, sudden trouble walking, or loss of balance. E - Eyes. Signs are trouble seeing or a sudden change in vision. F - Face. Signs are sudden weakness or numbness of the face, or the face or eyelid drooping on one side. A - Arms. Signs are weakness or numbness in an arm. This happens suddenly and usually on one side of the body. S - Speech. Signs are sudden trouble speaking, slurred speech, or trouble understanding what people say. T - Time. Time to call emergency services. Write down what time symptoms started. You have other signs of a stroke, such as: A sudden, severe headache with no known cause. Nausea or vomiting. Seizure. These symptoms may represent a serious problem that is an emergency. Do not wait to see if the symptoms will go away. Get medical help right away. Call your local emergency services (911 in the [place] Do not drive yourself to the hospital. Summary Cholesterol plaques increase your risk for heart attack and stroke. Work with your health care provider to keep your cholesterol levels in a healthy range. Eat a healthy, balanced diet, get regular exercise, and maintain a healthy weight. Do not use any products that contain nicotine or tobacco, such as cigarettes, e-cigarettes, and chewing tobacco. Get help right away if you have any symptoms of a stroke. This information is not intended to replace advice given to you by your health care  provider. Make sure you discuss any questions you have with your healthcare provider. Document Revised: 03/30/2019 Document Reviewed: 03/30/2019 Elsevier Patient Education  2022 [place]. Fatigue If you have fatigue, you feel tired all the time and have a lack of energy or a lack of motivation. Fatigue may make it difficult to start or complete tasks because of exhaustion. In general, occasional or mild fatigue is often a normal response to activity or life. However, long-lasting (chronic) or extreme fatigue may be a symptom of a medical condition. Follow these instructions at home: General instructions Watch your fatigue for any changes. Go to bed and get up at the same time every day. Avoid fatigue by pacing yourself during the day and getting enough sleep at night. Maintain a healthy weight. Medicines Take over-the-counter and prescription medicines only as told by your health care provider. Take a multivitamin, if told by your health care provider.  Do not use herbal or dietary supplements unless they are approved by your health care provider. Activity  Exercise regularly, as told by your health care provider. Use or practice techniques to help you relax, such as yoga, tai chi, meditation, or massage therapy.  Eating and drinking  Avoid heavy meals in the evening. Eat a well-balanced diet, which includes lean proteins, whole grains, plenty of fruits and vegetables, and low-fat dairy products.  Avoid consuming too much caffeine. Avoid the use of alcohol. Drink enough fluid to keep your urine pale yellow.  Lifestyle Change situations that cause you stress. Try to keep your work and personal schedule in balance. Do not use any products that contain nicotine or tobacco, such as cigarettes and e-cigarettes. If you need help quitting, ask your health care provider. Do not use drugs. Contact a health care provider if: Your fatigue does not get better. You have a fever. You  suddenly lose or gain weight. You have headaches. You have trouble falling asleep or sleeping through the night. You feel angry, guilty, anxious, or sad. You are unable to have a bowel movement (constipation). Your skin is dry. You have swelling in your legs or another part of your body. Get help right away if: You feel confused. Your vision is blurry. You feel faint or you pass out. You have a severe headache. You have severe pain in your abdomen, your back, or the area between your waist and hips (pelvis). You have chest pain, shortness of breath, or an irregular or fast heartbeat. You are unable to urinate, or you urinate less than normal. You have abnormal bleeding, such as bleeding from the rectum, vagina, nose, lungs, or nipples. You vomit blood. You have thoughts about hurting yourself or others. If you ever feel like you may hurt yourself or others, or have thoughts about taking your own life, get help right away. You can go to your nearest emergency department or call: Your local emergency services (911 in the [place] A suicide crisis helpline, such as the [place] at 409-807-6545. This is open 24 hours a day. Summary If you have fatigue, you feel tired all the time and have a lack of energy or a lack of motivation. Fatigue may make it difficult to start or complete tasks because of exhaustion. Long-lasting (chronic) or extreme fatigue may be a symptom of a medical condition. Exercise regularly, as told by your health care provider. Change situations that cause you stress. Try to keep your work and personal schedule in balance. This information is not intended to replace advice given to you by your health care provider. Make sure you discuss any questions you have with your healthcare provider. Document Revised: 03/10/2020 Document Reviewed: 03/10/2020 Elsevier Patient Education  [place]. [employer] Current Therapy 2021 (pp. 213-216).  [place], [place]: Elsevier.">  Gastroesophageal Reflux Disease, Adult Gastroesophageal reflux (GER) happens when acid from the stomach flows up into the tube that connects the mouth and the stomach (esophagus). Normally, food travels down the esophagus and stays in the stomach to be digested. However, when a person has GER, food and stomach acid sometimes move back up into the esophagus. If this becomes a more serious problem, the person may be diagnosed with a disease called gastroesophageal reflux disease (GERD). GERD occurs when the reflux: Happens often. Causes frequent or severe symptoms. Causes problems such as damage to the esophagus. When stomach acid comes in contact with the esophagus, the acid may cause inflammation in the esophagus. Over time, GERD may create small holes (ulcers) in the lining of the esophagus. What are the causes? This condition is caused by a problem with the muscle between the esophagus and the stomach (lower esophageal sphincter, or LES). Normally, the LES muscle closes after food passes through the esophagus to the stomach. When the LES is weakened or abnormal, it does not close properly, and that allows food and stomach acid to go  back up into theesophagus. The LES can be weakened by certain dietary substances, medicines, and medical conditions, including: Tobacco use. Pregnancy. Having a hiatal hernia. Alcohol use. Certain foods and beverages, such as coffee, chocolate, onions, and peppermint. What increases the risk? You are more likely to develop this condition if you: Have an increased body weight. Have a connective tissue disorder. Take NSAIDs, such as ibuprofen. What are the signs or symptoms? Symptoms of this condition include: Heartburn. Difficult or painful swallowing and the feeling of having a lump in the throat. A bitter taste in the mouth. Bad breath and having a large amount of saliva. Having an upset or bloated stomach and belching. Chest  pain. Different conditions can cause chest pain. Make sure you see your health care provider if you experience chest pain. Shortness of breath or wheezing. Ongoing (chronic) cough or a nighttime cough. Wearing away of tooth enamel. Weight loss. How is this diagnosed? This condition may be diagnosed based on a medical history and a physical exam. To determine if you have mild or severe GERD, your health care provider may also monitor how you respond to treatment. You may also have tests, including: A test to examine your stomach and esophagus with a small camera (endoscopy). A test that measures the acidity level in your esophagus. A test that measures how much pressure is on your esophagus. A barium swallow or modified barium swallow test to show the shape, size, and functioning of your esophagus. How is this treated? Treatment for this condition may vary depending on how severe your symptoms are. Your health care provider may recommend: Changes to your diet. Medicine. Surgery. The goal of treatment is to help relieve your symptoms and to preventcomplications. Follow these instructions at home: Eating and drinking  Follow a diet as recommended by your health care provider. This may involve avoiding foods and drinks such as: Coffee and tea, with or without caffeine. Drinks that contain alcohol. Energy drinks and sports drinks. Carbonated drinks or sodas. Chocolate and cocoa. Peppermint and mint flavorings. Garlic and onions. Horseradish. Spicy and acidic foods, including peppers, chili powder, curry powder, vinegar, hot sauces, and barbecue sauce. Citrus fruit juices and citrus fruits, such as oranges, lemons, and limes. Tomato-based foods, such as red sauce, chili, salsa, and pizza with red sauce. Fried and fatty foods, such as donuts, french fries, potato chips, and high-fat dressings. High-fat meats, such as hot dogs and fatty cuts of red and white meats, such as rib eye steak,  sausage, ham, and bacon. High-fat dairy items, such as whole milk, butter, and cream cheese. Eat small, frequent meals instead of large meals. Avoid drinking large amounts of liquid with your meals. Avoid eating meals during the 2-3 hours before bedtime. Avoid lying down right after you eat. Do not exercise right after you eat.  Lifestyle  Do not use any products that contain nicotine or tobacco. These products include cigarettes, chewing tobacco, and vaping devices, such as e-cigarettes. If you need help quitting, ask your health care provider. Try to reduce your stress by using methods such as yoga or meditation. If you need help reducing stress, ask your health care provider. If you are overweight, reduce your weight to an amount that is healthy for you. Ask your health care provider for guidance about a safe weight loss goal.  General instructions Pay attention to any changes in your symptoms. Take over-the-counter and prescription medicines only as told by your health care provider. Do not take aspirin,  ibuprofen, or other NSAIDs unless your health care provider told you to take these medicines. Wear loose-fitting clothing. Do not wear anything tight around your waist that causes pressure on your abdomen. Raise (elevate) the head of your bed about 6 inches (15 cm). You can use a wedge to do this. Avoid bending over if this makes your symptoms worse. Keep all follow-up visits. This is important. Contact a health care provider if: You have: New symptoms. Unexplained weight loss. Difficulty swallowing or it hurts to swallow. Wheezing or a persistent cough. A hoarse voice. Your symptoms do not improve with treatment. Get help right away if: You have sudden pain in your arms, neck, jaw, teeth, or back. You suddenly feel sweaty, dizzy, or light-headed. You have chest pain or shortness of breath. You vomit and the vomit is green, yellow, or black, or it looks like blood or coffee  grounds. You faint. You have stool that is red, bloody, or black. You cannot swallow, drink, or eat. These symptoms may represent a serious problem that is an emergency. Do not wait to see if the symptoms will go away. Get medical help right away. Call your local emergency services (911 in the [place] Do not drive yourself to the hospital. Summary Gastroesophageal reflux happens when acid from the stomach flows up into the esophagus. GERD is a disease in which the reflux happens often, causes frequent or severe symptoms, or causes problems such as damage to the esophagus. Treatment for this condition may vary depending on how severe your symptoms are. Your health care provider may recommend diet and lifestyle changes, medicine, or surgery. Contact a health care provider if you have new or worsening symptoms. Take over-the-counter and prescription medicines only as told by your health care provider. Do not take aspirin, ibuprofen, or other NSAIDs unless your health care provider told you to do so. Keep all follow-up visits as told by your health care provider. This is important. This information is not intended to replace advice given to you by your health care provider. Make sure you discuss any questions you have with your healthcare provider. Document Revised: 11/09/2019 Document Reviewed: 11/09/2019 Elsevier Patient Education  [place].

## 2020-12-30 NOTE — Assessment & Plan Note (Signed)
Patient is experiencing new signs and symptoms of fatigue in the past few weeks.  Patient reports having a new grandchild that has kept her up most nights.  No fever nausea, chills or any other symptoms associated.  Patient has not made changes to diet either.  Completed CBC and CMP results pending.

## 2020-12-30 NOTE — Assessment & Plan Note (Signed)
Completed PHQ-9, patient is doing well, symptoms well controlled on Zoloft 50 mg tablet by mouth daily.  Education provided to patient printed handouts given.

## 2021-01-04 NOTE — Progress Notes (Signed)
R/c about labs 

## 2021-01-06 ENCOUNTER — Encounter: Payer: Self-pay | Admitting: Nurse Practitioner

## 2021-01-06 ENCOUNTER — Ambulatory Visit (INDEPENDENT_AMBULATORY_CARE_PROVIDER_SITE_OTHER): Payer: Medicaid Other | Admitting: Nurse Practitioner

## 2021-01-06 DIAGNOSIS — R110 Nausea: Secondary | ICD-10-CM | POA: Insufficient documentation

## 2021-01-06 HISTORY — DX: Nausea: R11.0

## 2021-01-06 MED ORDER — ONDANSETRON HCL 4 MG PO TABS: 4.0000 mg | ORAL_TABLET | Freq: Three times a day (TID) | ORAL | 0 refills | Status: DC | PRN

## 2021-01-06 NOTE — Progress Notes (Signed)
   Virtual Visit  Note Due to COVID-19 pandemic this visit was conducted virtually. This visit type was conducted due to national recommendations for restrictions regarding the COVID-19 Pandemic (e.g. social distancing, sheltering in place) in an effort to limit this patient's exposure and mitigate transmission in our community. All issues noted in this document were discussed and addressed.  A physical exam was not performed with this format.  I connected with Kaitlin Fleming on 01/06/21 at 9 AM by telephone and verified that I am speaking with the correct person using two identifiers. Kaitlin Fleming is currently located at home during visit. The provider, Daryll Drown, NP is located in their office at time of visit.  I discussed the limitations, risks, security and privacy concerns of performing an evaluation and management service by telephone and the availability of in person appointments. I also discussed with the patient that there may be a patient responsible charge related to this service. The patient expressed understanding and agreed to proceed.   History and Present Illness:  HPI Patient is reporting nausea, symptoms not well controlled in the past week due to change in medication dose.  Zoloft 100 mg tablet by mouth daily. Patient reports decreasing dose to 100 mg tablet by mouth daily and feels slightly better than when she was on a higher dose.   ROS   Observations/Objective: Televisit patient not in distress.  Assessment and Plan: Patient is reporting nausea, symptoms not well controlled in the past week due to change in medication dose.  Zoloft 100 mg tablet by mouth daily. Patient reports decreasing dose to 100 mg tablet by mouth daily and feels slightly better than when she was on a higher dose.  Zofran 4 mg tablet by mouth as needed every 8 hours. Rx sent to pharmacy.   Follow Up Instructions: Follow-up with worsening unresolved symptoms.    I discussed the  assessment and treatment plan with the patient. The patient was provided an opportunity to ask questions and all were answered. The patient agreed with the plan and demonstrated an understanding of the instructions.   The patient was advised to call back or seek an in-person evaluation if the symptoms worsen or if the condition fails to improve as anticipated.  The above assessment and management plan was discussed with the patient. The patient verbalized understanding of and has agreed to the management plan. Patient is aware to call the clinic if symptoms persist or worsen. Patient is aware when to return to the clinic for a follow-up visit. Patient educated on when it is appropriate to go to the emergency department.   Time call ended: 9:08 AM  I provided 8 minutes of  non face-to-face time during this encounter.    Daryll Drown, NP

## 2021-01-06 NOTE — Assessment & Plan Note (Signed)
Patient is reporting nausea, symptoms not well controlled in the past week due to change in medication dose.  Zoloft 100 mg tablet by mouth daily. Patient reports decreasing dose to 100 mg tablet by mouth daily and feels slightly better than when she was on a higher dose.  Zofran 4 mg tablet by mouth as needed every 8 hours. Rx sent to pharmacy.

## 2021-01-19 DIAGNOSIS — S30861A Insect bite (nonvenomous) of abdominal wall, initial encounter: Secondary | ICD-10-CM | POA: Diagnosis not present

## 2021-01-19 DIAGNOSIS — R21 Rash and other nonspecific skin eruption: Secondary | ICD-10-CM | POA: Diagnosis not present

## 2021-01-19 DIAGNOSIS — R110 Nausea: Secondary | ICD-10-CM | POA: Diagnosis not present

## 2021-01-19 DIAGNOSIS — W57XXXA Bitten or stung by nonvenomous insect and other nonvenomous arthropods, initial encounter: Secondary | ICD-10-CM | POA: Diagnosis not present

## 2021-01-19 DIAGNOSIS — R5383 Other fatigue: Secondary | ICD-10-CM | POA: Diagnosis not present

## 2021-01-19 DIAGNOSIS — R1011 Right upper quadrant pain: Secondary | ICD-10-CM | POA: Diagnosis not present

## 2021-01-23 DIAGNOSIS — R1011 Right upper quadrant pain: Secondary | ICD-10-CM | POA: Diagnosis not present

## 2021-01-23 DIAGNOSIS — S30861A Insect bite (nonvenomous) of abdominal wall, initial encounter: Secondary | ICD-10-CM | POA: Diagnosis not present

## 2021-01-23 DIAGNOSIS — K760 Fatty (change of) liver, not elsewhere classified: Secondary | ICD-10-CM | POA: Diagnosis not present

## 2021-01-27 ENCOUNTER — Ambulatory Visit: Payer: Medicaid Other | Admitting: Gastroenterology

## 2021-01-27 ENCOUNTER — Encounter: Payer: Self-pay | Admitting: Gastroenterology

## 2021-01-27 ENCOUNTER — Other Ambulatory Visit: Payer: Self-pay

## 2021-01-27 VITALS — BP 130/82 | HR 94 | Temp 98.2°F | Ht 67.0 in | Wt 185.6 lb

## 2021-01-27 DIAGNOSIS — R1319 Other dysphagia: Secondary | ICD-10-CM

## 2021-01-27 DIAGNOSIS — Z800 Family history of malignant neoplasm of digestive organs: Secondary | ICD-10-CM

## 2021-01-27 DIAGNOSIS — K219 Gastro-esophageal reflux disease without esophagitis: Secondary | ICD-10-CM | POA: Diagnosis not present

## 2021-01-27 DIAGNOSIS — R110 Nausea: Secondary | ICD-10-CM

## 2021-01-27 DIAGNOSIS — Z91018 Allergy to other foods: Secondary | ICD-10-CM | POA: Diagnosis not present

## 2021-01-27 HISTORY — DX: Family history of malignant neoplasm of digestive organs: Z80.0

## 2021-01-27 MED ORDER — PANTOPRAZOLE SODIUM 40 MG PO TBEC: 40.0000 mg | DELAYED_RELEASE_TABLET | Freq: Two times a day (BID) | ORAL | 5 refills | Status: DC

## 2021-01-27 NOTE — Progress Notes (Signed)
Primary Care Physician: Daryll Drown, NP  Primary Gastroenterologist:  Roetta Sessions, MD  Chief Complaint  Patient presents with   alpha gal allergy    Reports she was told yesterday as why she is having a lot of nausea (sick on stomach).    Dysphagia    Solid foods   Nausea    Scheduled for HIDA monday   fh colon cancer    Mgm hx colon cancer, paternal uncle hx colon cancer. Wants to discuss having a TCS since she has never had one prior.    HPI: Kaitlin Fleming is a 60 y.o. female here for further evaluation of dysphagia, consideration of colonoscopy.  Patient was last seen in August 2021 with history of GERD, dysphagia, fatty liver.  She had an EGD scheduled in July 2021 but had to cancel it due to other illness.  Patient has been suffering with nausea and rash for over a year.  Rash was intermittent, involving abdomen, arms, legs.  Sometimes would note mouth discomfort/swelling.  Recently seen in urgent care for rash and right upper quadrant pain.  Alpha gal panel was positive.  She just received these results this week.  She has started to avoid beef, pork.  Does not eat lamb.  Symptoms have also been associated with significant nausea, this morning was significantly nauseated and almost canceled the appointment.  No vomiting.  Currently on Keflex for UTI.  She has had right upper quadrant pain off and on for couple of months.  Seems to be worse with food.  Recent right upper quadrant ultrasound showing fatty liver but otherwise unremarkable.  LFTs normal except for alkaline phosphatase of 122, minimally elevated.  HIDA scan scheduled for Monday.  Continues to have dysphagia, occurring for approximately 2 years.  Mouth stays dry in the setting of rheumatoid arthritis/medications.  Often feels like the food does not want to go down, has had to vomit food back up.  Has occurred about 5 times in the past year.  Continues to have heartburn on Nexium twice daily.  Bowel movements  regular.  No blood in the stool or melena.  No prior colonoscopy.  Both maternal grandmother and paternal uncle had colon cancer in their 10s.   Current Outpatient Medications  Medication Sig Dispense Refill   acetaminophen (TYLENOL) 500 MG tablet Take 1 tablet (500 mg total) by mouth every 6 (six) hours as needed. 30 tablet 0   esomeprazole (NEXIUM) 40 MG capsule TAKE 1 CAPSULE TWICE DAILY BEFORE A MEAL 60 capsule 3   fluticasone (FLONASE) 50 MCG/ACT nasal spray Place 2 sprays into both nostrils daily. 16 g 6   loratadine (CLARITIN) 10 MG tablet Take 1 tablet (10 mg total) by mouth at bedtime. 30 tablet 1   ondansetron (ZOFRAN) 4 MG tablet Take 1 tablet (4 mg total) by mouth every 8 (eight) hours as needed for nausea or vomiting. 20 tablet 0   oxyCODONE-acetaminophen (PERCOCET) 10-325 MG tablet Take 1 tablet by mouth every 6 (six) hours as needed for pain.      predniSONE (DELTASONE) 5 MG tablet Take 1 tablet (5 mg total) by mouth daily. 30 tablet 1   sertraline (ZOLOFT) 50 MG tablet Take 1 tablet (50 mg total) by mouth daily. 90 tablet 3   Upadacitinib ER (RINVOQ) 15 MG TB24 Take 1 tablet by mouth daily.     No current facility-administered medications for this visit.    Allergies as of 01/27/2021 - Review Complete  01/27/2021  Allergen Reaction Noted   Macrobid [nitrofurantoin] Shortness Of Breath and Rash 12/22/2019   Ciprofloxacin Nausea And Vomiting 10/27/2013   Lotensin [benazepril hcl]  09/01/2016   Plaquenil [hydroxychloroquine sulfate] Nausea And Vomiting and Other (See Comments) 03/21/2014   Macrolides and ketolides  05/12/2018   Methotrexate  12/04/2017   Remicade [infliximab]  05/12/2018   Tofacitinib Other (See Comments) 11/01/2016   Atorvastatin Other (See Comments) 11/25/2019   Doxycycline Other (See Comments) 12/02/2013   Ibuprofen Hives 03/21/2014   Penicillins Other (See Comments) 12/02/2013   Pravastatin Other (See Comments) 11/26/2019   Sulfa antibiotics Other  (See Comments) 12/02/2013    Past Medical History:  Diagnosis Date   Chronic back pain    stenosis   Chronic pain    takes Lexapro daily   Complication of anesthesia    pt states after surgery in 2000 had to wear a heart monitor for 3 days.   DDD (degenerative disc disease)    Depression    GERD (gastroesophageal reflux disease)    takes OTC meds if needed   History of blood transfusion    as a child   History of bronchitis 48yrs ago   Hyperlipidemia    takes Fenofibrate daily   Joint pain    Joint swelling    Kidney stone    has one right now on the right side but not giving any problems   Osteoarthritis    Peripheral edema    takes HCTZ daily    Pneumonia 51yrs ago   hx of   PONV (postoperative nausea and vomiting)    Rheumatoid arthritis (HCC)    Rheumatoid arthritis (HCC)    Weakness    and tingling on left side   Past Surgical History:  Procedure Laterality Date   ABDOMINAL HYSTERECTOMY  2000   BACK SURGERY  03/10/14   lumbar fusion   LUMBAR LAMINECTOMY/DECOMPRESSION MICRODISCECTOMY Left 12/08/2013   Procedure: LEFT LUMBAR FOUR-FIVEW, LUMBAR FIVE-SACRAL ONE LAMINECTOMY;  Surgeon: Karn Cassis, MD;  Location: [facility];  Service: Neurosurgery;  Laterality: Left;   TUBAL LIGATION  24 yrs ago   Family History  Problem Relation Age of Onset   Colon cancer Maternal Grandmother    Colon cancer Paternal Uncle    Social History   Tobacco Use   Smoking status: Never   Smokeless tobacco: Never  Vaping Use   Vaping Use: Never used  Substance Use Topics   Alcohol use: No   Drug use: No     ROS:  General: Negative for anorexia, weight loss, fever, chills, fatigue, weakness.+nausea ENT: Negative for hoarseness, difficulty swallowing , nasal congestion. CV: Negative for chest pain, angina, palpitations, dyspnea on exertion, peripheral edema.  Respiratory: Negative for dyspnea at rest, dyspnea on exertion, cough, sputum, wheezing.  GI: See history of  present illness. GU:  Negative for dysuria, hematuria, urinary incontinence, urinary frequency, nocturnal urination.+UTI  Endo: Negative for unusual weight change.    Physical Examination:   BP 130/82   Pulse 94   Temp 98.2 F (36.8 C)   Ht 5\' 7"  (1.702 m)   Wt 185 lb 9.6 oz (84.2 kg)   BMI 29.07 kg/m   General: Well-nourished, well-developed in no acute distress.  Eyes: No icterus. Mouth: masked Lungs: Clear to auscultation bilaterally.  Heart: Regular rate and rhythm, no murmurs rubs or gallops.  Abdomen: Bowel sounds are normal,  nondistended, no hepatosplenomegaly or masses, no abdominal bruits or hernia , no rebound or  guarding.  Mild ruq tenderness. Neg murphy's sign. Extremities: No lower extremity edema. No clubbing or deformities. Neuro: Alert and oriented x 4   Skin: Warm and dry, no jaundice.   Psych: Alert and cooperative, normal mood and affect.  Labs:  Lab Results  Component Value Date   CREATININE 0.66 12/30/2020   BUN 6 (L) 12/30/2020   NA 143 12/30/2020   K 3.8 12/30/2020   CL 98 12/30/2020   CO2 29 12/30/2020   Lab Results  Component Value Date   ALT 10 12/30/2020   AST 13 12/30/2020   ALKPHOS 117 12/30/2020   BILITOT 0.3 12/30/2020   Lab Results  Component Value Date   WBC 5.6 12/30/2020   HGB 12.8 12/30/2020   HCT 39.3 12/30/2020   MCV 85 12/30/2020   PLT 211 12/30/2020   Lab Results  Component Value Date   TSH 2.030 12/30/2020   Labs from January 19, 2021: Lipase 24, white blood cell count 6200, hemoglobin 12.6, platelets 204,000, sodium 140, potassium 5.3, creatinine 0.61, albumin 4.3, total bilirubin 0.2, alkaline phosphatase 122, AST 17, ALT 14.  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  IgE beef 13.70, IgE pork 13.20, IgE lamb 3.94.  Imaging Studies:  Right upper quadrant ultrasound January 23, 2021: Fatty liver  Assessment:  GERD/dysphagia: Heartburn despite Nexium 40 mg twice daily.  Reinforced  antireflux measures.  We will switch PPI to pantoprazole 40 mg twice daily.  Recommend EGD with esophageal dilation given history of dysphagia.  She may have eosinophilic esophagitis, reflux esophagitis, esophageal stricture.  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  She has stopped eating beef, pork.  Does not consume lamb products.  Monitor for rash closely.  Hopefully some of her nausea and GI symptoms would improve with food avoidance as well.  Would encourage her to see an allergist as EpiPen would likely be beneficial for her given high titers and previous reactions.  Patient was offered referral when diagnosed but declined.  She will discuss further with PCP.  Family history colon cancer: 2 second-degree relatives with diagnosis of colon cancer.  Patient has never had a colonoscopy.  Would recommend 1 in the near future.  Right upper quadrant pain/nausea: She is scheduled for HIDA on Monday.  She will complete gallbladder work-up.  It is difficult to tell with her symptoms are biliary versus related to alpha gal/gastritis/peptic ulcer disease.  Plan: Stop Nexium, start pantoprazole 40 mg twice daily before breakfast and evening meal. Complete HIDA scan as scheduled.  We will follow-up results. Plan on moving towards colonoscopy and upper endoscopy with esophageal dilation in the near future. Plan for propofol. ASA II.  I have discussed the risks, alternatives, benefits with regards to but not limited to the risk of reaction to medication, bleeding, infection, perforation and the patient is agreeable to proceed. Written consent to be obtained. Recommend she see allergist regarding alpha gal allergy.  Will benefit from having EpiPen available.  Patient will discuss further with PCP.

## 2021-01-27 NOTE — Patient Instructions (Signed)
Stop Nexium, start pantoprazole 40 mg twice daily before breakfast and evening meal.  Prescription sent to your pharmacy. We will follow-up upcoming HIDA scan.  Once results available, we will move forward with scheduling colonoscopy and upper endoscopy. Consider seeing an allergist regarding alpha gal allergy.  Given your allergic reactions over the past 1 year, you would benefit from carrying EpiPen.

## 2021-01-30 ENCOUNTER — Telehealth: Payer: Self-pay

## 2021-01-30 DIAGNOSIS — R1011 Right upper quadrant pain: Secondary | ICD-10-CM | POA: Diagnosis not present

## 2021-01-30 NOTE — Telephone Encounter (Signed)
Tana Coast advised pt needs to be scheduled for TCS/EGD/DIL w/Propofol ASA 2 with Dr. Jena Gauss. Tried to call pt, LMOVM for return call.

## 2021-01-30 NOTE — Telephone Encounter (Signed)
Spoke to pt, she had gallbladder scan this morning at [facility]. She wants to wait until she finds out these results. If she has to have gallbladder removed she wants to that before TCS/EGD/DIL.

## 2021-02-01 NOTE — Telephone Encounter (Signed)
noted 

## 2021-02-01 NOTE — Telephone Encounter (Signed)
Reviewed HIDA results which are negative. Recommend proceeding with TCS/EGD/ED as recommended at ov, whenever she is ready.

## 2021-02-01 NOTE — Telephone Encounter (Signed)
Called pt, she has been having trouble with Alpha-gal. She would like to wait until she has Alpha-gal better under control. She will call back to schedule procedure when she is feeling better.

## 2021-02-13 DIAGNOSIS — I10 Essential (primary) hypertension: Secondary | ICD-10-CM | POA: Diagnosis not present

## 2021-02-13 DIAGNOSIS — Z886 Allergy status to analgesic agent status: Secondary | ICD-10-CM | POA: Diagnosis not present

## 2021-02-13 DIAGNOSIS — M069 Rheumatoid arthritis, unspecified: Secondary | ICD-10-CM | POA: Diagnosis not present

## 2021-02-13 DIAGNOSIS — L299 Pruritus, unspecified: Secondary | ICD-10-CM | POA: Diagnosis not present

## 2021-02-13 DIAGNOSIS — T7840XA Allergy, unspecified, initial encounter: Secondary | ICD-10-CM | POA: Diagnosis not present

## 2021-02-13 DIAGNOSIS — R21 Rash and other nonspecific skin eruption: Secondary | ICD-10-CM | POA: Diagnosis not present

## 2021-02-13 DIAGNOSIS — L509 Urticaria, unspecified: Secondary | ICD-10-CM | POA: Diagnosis not present

## 2021-02-13 DIAGNOSIS — M810 Age-related osteoporosis without current pathological fracture: Secondary | ICD-10-CM | POA: Diagnosis not present

## 2021-02-15 ENCOUNTER — Telehealth: Payer: Self-pay

## 2021-02-15 NOTE — Telephone Encounter (Signed)
Transition Care Management Unsuccessful Follow-up Telephone Call  Date of discharge and from where:  02/14/2021-[facility]  Attempts:  1st Attempt  Reason for unsuccessful TCM follow-up call:  Left voice message

## 2021-02-16 NOTE — Telephone Encounter (Signed)
Transition Care Management Follow-up Telephone Call Date of discharge and from where: 02/14/2021 from [facility] How have you been since you were released from the hospital? Pt stated that she is feeling better and did not have any questions or concerns at this time.  Any questions or concerns? No  Items Reviewed: Did the pt receive and understand the discharge instructions provided? Yes  Medications obtained and verified? Yes  Other? No  Any new allergies since your discharge? No  Dietary orders reviewed? No Do you have support at home? Yes   Functional Questionnaire: (I = Independent and D = Dependent) ADLs: I  Bathing/Dressing- I  Meal Prep- I  Eating- I  Maintaining continence- I  Transferring/Ambulation- I  Managing Meds- I   Follow up appointments reviewed:  PCP Hospital f/u appt confirmed? Yes  Scheduled to see Lynnell Chad, NP on 04/03/2021 @ 11:00am. Specialist Hospital f/u appt confirmed? No   Are transportation arrangements needed? No  If their condition worsens, is the pt aware to call PCP or go to the Emergency Dept.? Yes Was the patient provided with contact information for the PCP's office or ED? Yes Was to pt encouraged to call back with questions or concerns? Yes

## 2021-03-07 DIAGNOSIS — J0100 Acute maxillary sinusitis, unspecified: Secondary | ICD-10-CM | POA: Diagnosis not present

## 2021-03-14 ENCOUNTER — Encounter: Payer: Self-pay | Admitting: Nurse Practitioner

## 2021-03-14 ENCOUNTER — Telehealth: Payer: Self-pay | Admitting: Nurse Practitioner

## 2021-03-14 ENCOUNTER — Ambulatory Visit: Payer: Medicaid Other | Admitting: Nurse Practitioner

## 2021-03-14 DIAGNOSIS — J069 Acute upper respiratory infection, unspecified: Secondary | ICD-10-CM | POA: Diagnosis not present

## 2021-03-14 LAB — VERITOR FLU A/B WAIVED
Influenza A: NEGATIVE
Influenza B: NEGATIVE

## 2021-03-14 NOTE — Telephone Encounter (Signed)
Pt calling to check on results from flu test. No notes added yet. Please review and call pt.

## 2021-03-14 NOTE — Assessment & Plan Note (Signed)
Take meds as prescribed - Use a cool mist humidifier  -Use saline nose sprays frequently -Force fluids -For fever or aches or pains- take Tylenol or ibuprofen. -If symptoms do not improve, she may need to be COVID tested to rule this out -Flu and COVID swab completed results pending. Follow up with worsening unresolved symptoms

## 2021-03-14 NOTE — Telephone Encounter (Signed)
Patient aware of flu results

## 2021-03-14 NOTE — Progress Notes (Signed)
   Virtual Visit  Note Due to COVID-19 pandemic this visit was conducted virtually. This visit type was conducted due to national recommendations for restrictions regarding the COVID-19 Pandemic (e.g. social distancing, sheltering in place) in an effort to limit this patient's exposure and mitigate transmission in our community. All issues noted in this document were discussed and addressed.  A physical exam was not performed with this format.  I connected with Kaitlin Fleming on 03/14/21 at 11:15 AM by telephone and verified that I am speaking with the correct person using two identifiers. Kaitlin Fleming is currently located at home during visit. The provider, Daryll Drown, NP is located in their office at time of visit.  I discussed the limitations, risks, security and privacy concerns of performing an evaluation and management service by telephone and the availability of in person appointments. I also discussed with the patient that there may be a patient responsible charge related to this service. The patient expressed understanding and agreed to proceed.   History and Present Illness:  URI  This is a new problem. Episode onset: In the past 2 to 4 days. There has been no fever. Associated symptoms include congestion, coughing, headaches and wheezing. Pertinent negatives include no nausea or vomiting. She has tried nothing for the symptoms.     Review of Systems  Constitutional:  Positive for chills and malaise/fatigue. Negative for fever.  HENT:  Positive for congestion.   Respiratory:  Positive for cough and wheezing.   Gastrointestinal:  Negative for nausea and vomiting.  Neurological:  Positive for headaches.  All other systems reviewed and are negative.   Observations/Objective: Televisit patient not in distress.  Assessment and Plan: Take meds as prescribed - Use a cool mist humidifier  -Use saline nose sprays frequently -Force fluids -For fever or aches or pains- take  Tylenol or ibuprofen. -If symptoms do not improve, she may need to be COVID tested to rule this out -Flu and COVID swab completed results pending. Follow up with worsening unresolved symptoms  50 Follow Up Instructions: Follow-up with worsening unresolved symptoms.    I discussed the assessment and treatment plan with the patient. The patient was provided an opportunity to ask questions and all were answered. The patient agreed with the plan and demonstrated an understanding of the instructions.   The patient was advised to call back or seek an in-person evaluation if the symptoms worsen or if the condition fails to improve as anticipated.  The above assessment and management plan was discussed with the patient. The patient verbalized understanding of and has agreed to the management plan. Patient is aware to call the clinic if symptoms persist or worsen. Patient is aware when to return to the clinic for a follow-up visit. Patient educated on when it is appropriate to go to the emergency department.   Time call ended:    I provided 10 10 minutes of  non face-to-face time during this encounter.    Daryll Drown, NP

## 2021-03-14 NOTE — Patient Instructions (Signed)

## 2021-03-15 ENCOUNTER — Encounter (HOSPITAL_COMMUNITY): Payer: Self-pay

## 2021-03-15 ENCOUNTER — Other Ambulatory Visit: Payer: Self-pay

## 2021-03-15 ENCOUNTER — Emergency Department (HOSPITAL_COMMUNITY): Payer: Medicaid Other

## 2021-03-15 ENCOUNTER — Emergency Department (HOSPITAL_COMMUNITY)
Admission: EM | Admit: 2021-03-15 | Discharge: 2021-03-15 | Disposition: A | Discharge status: Home / Self Care | Payer: Medicaid Other | Attending: Emergency Medicine | Admitting: Emergency Medicine

## 2021-03-15 DIAGNOSIS — J101 Influenza due to other identified influenza virus with other respiratory manifestations: Secondary | ICD-10-CM | POA: Diagnosis not present

## 2021-03-15 DIAGNOSIS — J111 Influenza due to unidentified influenza virus with other respiratory manifestations: Secondary | ICD-10-CM | POA: Diagnosis not present

## 2021-03-15 DIAGNOSIS — R059 Cough, unspecified: Secondary | ICD-10-CM | POA: Diagnosis not present

## 2021-03-15 DIAGNOSIS — Z20822 Contact with and (suspected) exposure to covid-19: Secondary | ICD-10-CM | POA: Diagnosis not present

## 2021-03-15 DIAGNOSIS — R0602 Shortness of breath: Secondary | ICD-10-CM | POA: Diagnosis not present

## 2021-03-15 HISTORY — DX: Allergy to other foods: Z91.018

## 2021-03-15 LAB — SARS-COV-2, NAA 2 DAY TAT

## 2021-03-15 LAB — RESP PANEL BY RT-PCR (FLU A&B, COVID) ARPGX2
Influenza A by PCR: POSITIVE — AB
Influenza B by PCR: NEGATIVE
SARS Coronavirus 2 by RT PCR: NEGATIVE

## 2021-03-15 LAB — NOVEL CORONAVIRUS, NAA: SARS-CoV-2, NAA: NOT DETECTED

## 2021-03-15 MED ORDER — ALBUTEROL SULFATE HFA 108 (90 BASE) MCG/ACT IN AERS
1.0000 | INHALATION_SPRAY | Freq: Once | RESPIRATORY_TRACT | Status: AC
  Administered 2021-03-15: 1 via RESPIRATORY_TRACT
  Filled 2021-03-15: qty 6.7

## 2021-03-15 MED ORDER — OSELTAMIVIR PHOSPHATE 75 MG PO CAPS: 75.0000 mg | ORAL_CAPSULE | Freq: Two times a day (BID) | ORAL | 0 refills | Status: AC

## 2021-03-15 NOTE — ED Triage Notes (Signed)
Pt presents to ED with complaints of congestion and cough x 10 days. Pt has been exposed to flu with in house relative.

## 2021-03-15 NOTE — Discharge Instructions (Signed)
Tested positive for the flu and unfortunately are outside the treatment window.  I have given you inhaler please use as needed for wheezing and or shortness of breath you may take 1 to 2 puffs every 4-6 hours.  I recommend nasal decongestions like Flonase as well as Claritin, please continue with over-the-counter pain medication like ibuprofen or Tylenol every 6 as needed is also help with fevers and chills.  Please stay hydrated.  Please call your PCP for further evaluation in a week's time if symptoms not fully improved.  Come back to the emergency department if you develop chest pain, shortness of breath, severe abdominal pain, uncontrolled nausea, vomiting, diarrhea.

## 2021-03-15 NOTE — ED Provider Notes (Signed)
[facility] EMERGENCY DEPARTMENT Provider Note   CSN: [id number] Arrival date & time: 03/15/21  1707     History Chief Complaint  Patient presents with   Cough    Kaitlin Fleming is a 60 y.o. female.  HPI  Patient with significant medical history including GERD, RA, OA, peripheral edema presents with chief complaint of URI-like symptoms.  Patient states over the last 10 days she has been having  nasal congestion, and a dry cough.  But starting 2 days ago she developed subjective fevers and chills, as well as general body aches.  She denies actual having fever at home, denies headaches, chest pain, shortness of breath, abdominal pain, nausea, vomit or diarrhea.  She states she has been tolerant p.o. without difficulty, she is vaccinated against COVID but has not gotten her flu vaccine yet.  She endorses that some of her family have contracted the flu but she has had multiple negative flu tests.  She has no other complaints at this time, she denies  alleviating or aggravating factors.  Past Medical History:  Diagnosis Date   Allergy to alpha-gal    Chronic back pain    stenosis   Chronic pain    takes Lexapro daily   Complication of anesthesia    pt states after surgery in 2000 had to wear a heart monitor for 3 days.   DDD (degenerative disc disease)    Depression    GERD (gastroesophageal reflux disease)    takes OTC meds if needed   History of blood transfusion    as a child   History of bronchitis 19yrs ago   Hyperlipidemia    takes Fenofibrate daily   Joint pain    Joint swelling    Kidney stone    has one right now on the right side but not giving any problems   Osteoarthritis    Peripheral edema    takes HCTZ daily    Pneumonia 17yrs ago   hx of   PONV (postoperative nausea and vomiting)    Rheumatoid arthritis (HCC)    Rheumatoid arthritis (HCC)    Weakness    and tingling on left side    Patient Active Problem List   Diagnosis Date Noted   Upper  respiratory tract infection 03/14/2021   Allergy to alpha-gal 01/27/2021   FH: colon cancer 01/27/2021   Nausea 01/06/2021   Fatigue 12/30/2020   Lymphadenopathy of head and neck 11/03/2020   Depression, major, single episode, in partial remission ([place]) 09/09/2020   Congestion of nasal sinus 06/09/2020   Elevated triglycerides with high cholesterol 03/04/2020   GERD (gastroesophageal reflux disease) 06/25/2019   Dysphagia 06/25/2019   Fatty liver disease, nonalcoholic [id number]   Preventative health care 06/25/2019   Rheumatoid arthritis ([place]) 08/02/2017   Influenza due to influenza virus, type B 08/02/2017   Influenza B 08/02/2017   Lumbar radiculopathy, chronic 06/22/2014   Lumbar degenerative disc disease 03/10/2014   Lumbar stenosis without neurogenic claudication 12/08/2013    Past Surgical History:  Procedure Laterality Date   ABDOMINAL HYSTERECTOMY  2000   BACK SURGERY  03/10/14   lumbar fusion   LUMBAR LAMINECTOMY/DECOMPRESSION MICRODISCECTOMY Left 12/08/2013   Procedure: LEFT LUMBAR FOUR-FIVEW, LUMBAR FIVE-SACRAL ONE LAMINECTOMY;  Surgeon: Floyce Stakes, MD;  Location: [facility];  Service: Neurosurgery;  Laterality: Left;   TUBAL LIGATION  24 yrs ago     OB History     Gravida      Para  Term      Preterm      AB      Living  2      SAB      IAB      Ectopic      Multiple      Live Births              Family History  Problem Relation Age of Onset   Colon cancer Maternal Grandmother    Colon cancer Paternal Uncle     Social History   Tobacco Use   Smoking status: Never   Smokeless tobacco: Never  Vaping Use   Vaping Use: Never used  Substance Use Topics   Alcohol use: No   Drug use: No    Home Medications Prior to Admission medications   Medication Sig Start Date End Date Taking? Authorizing Provider  oseltamivir (TAMIFLU) 75 MG capsule Take 1 capsule (75 mg total) by mouth every 12 (twelve) hours for 5 days. 03/15/21  03/20/21 Yes Marcello Fennel, PA-C  acetaminophen (TYLENOL) 500 MG tablet Take 1 tablet (500 mg total) by mouth every 6 (six) hours as needed. 06/09/20   Ivy Lynn, NP  fluticasone (FLONASE) 50 MCG/ACT nasal spray Place 2 sprays into both nostrils daily. 06/09/20   Ivy Lynn, NP  loratadine (CLARITIN) 10 MG tablet Take 1 tablet (10 mg total) by mouth at bedtime. 08/04/17   Barton Dubois, MD  ondansetron (ZOFRAN) 4 MG tablet Take 1 tablet (4 mg total) by mouth every 8 (eight) hours as needed for nausea or vomiting. 01/06/21   Ivy Lynn, NP  oxyCODONE-acetaminophen (PERCOCET) 10-325 MG tablet Take 1 tablet by mouth every 6 (six) hours as needed for pain.     [provider]  pantoprazole (PROTONIX) 40 MG tablet Take 1 tablet (40 mg total) by mouth 2 (two) times daily before a meal. 01/27/21   Mahala Menghini, PA-C  predniSONE (DELTASONE) 5 MG tablet Take 1 tablet (5 mg total) by mouth daily. 06/27/20   Ivy Lynn, NP  sertraline (ZOLOFT) 50 MG tablet Take 1 tablet (50 mg total) by mouth daily. 08/19/19   Hawks, Alyse Low A, FNP  Upadacitinib ER (RINVOQ) 15 MG TB24 Take 1 tablet by mouth daily. 11/16/19   [provider]    Allergies    Macrobid [nitrofurantoin], Ciprofloxacin, Lotensin [benazepril hcl], Plaquenil [hydroxychloroquine sulfate], Macrolides and ketolides, Methotrexate, Remicade [infliximab], Tofacitinib, Atorvastatin, Doxycycline, Ibuprofen, Penicillins, Pravastatin, and Sulfa antibiotics  Review of Systems   Review of Systems  Constitutional:  Positive for chills and fever.  HENT:  Positive for congestion. Negative for sore throat.   Respiratory:  Positive for cough. Negative for shortness of breath.   Cardiovascular:  Negative for chest pain.  Gastrointestinal:  Negative for abdominal pain, diarrhea, nausea and vomiting.  Genitourinary:  Negative for enuresis.  Musculoskeletal:  Positive for myalgias. Negative for back pain.  Skin:  Negative  for rash.  Neurological:  Negative for dizziness and headaches.  Hematological:  Does not bruise/bleed easily.   Physical Exam Updated Vital Signs BP (!) 155/90   Pulse 85   Temp 98.2 F (36.8 C)   Resp 18   Ht 5\' 7"  (1.702 m)   Wt 77.1 kg   SpO2 100%   BMI 26.63 kg/m   Physical Exam Vitals and nursing note reviewed.  Constitutional:      General: She is not in acute distress.    Appearance: She is not ill-appearing.  HENT:     Head: Normocephalic and atraumatic.     Right Ear: Tympanic membrane, ear canal and external ear normal.     Left Ear: Tympanic membrane, ear canal and external ear normal.     Ears:     Comments: Patient some noted fluid behind her right TM no signs infection present no bulging TM, no erythema TM was fully intact.    Nose: No congestion.     Mouth/Throat:     Mouth: Mucous membranes are moist.     Pharynx: Oropharynx is clear. No oropharyngeal exudate or posterior oropharyngeal erythema.  Eyes:     Conjunctiva/sclera: Conjunctivae normal.  Cardiovascular:     Rate and Rhythm: Normal rate and regular rhythm.     Pulses: Normal pulses.     Heart sounds: No murmur heard.   No friction rub. No gallop.  Pulmonary:     Effort: No respiratory distress.     Breath sounds: No wheezing, rhonchi or rales.  Abdominal:     Palpations: Abdomen is soft.     Tenderness: There is no abdominal tenderness. There is no right CVA tenderness or left CVA tenderness.  Skin:    General: Skin is warm and dry.  Neurological:     Mental Status: She is alert.  Psychiatric:        Mood and Affect: Mood normal.    ED Results / Procedures / Treatments   Labs (all labs ordered are listed, but only abnormal results are displayed) Labs Reviewed  RESP PANEL BY RT-PCR (FLU A&B, COVID) ARPGX2 - Abnormal; Notable for the following components:      Result Value   Influenza A by PCR POSITIVE (*)    All other components within normal limits    EKG None  Radiology DG  Chest 2 View  Result Date: 03/15/2021 CLINICAL DATA:  Cough and short of breath EXAM: CHEST - 2 VIEW COMPARISON:  05/30/2018 FINDINGS: Heart size and vascularity normal. Mild atherosclerotic calcification aortic arch Chronic elevation right hemidiaphragm with mild right lower lobe atelectasis or scarring unchanged. No acute infiltrate or effusion. Negative for heart failure. IMPRESSION: No active cardiopulmonary disease. Electronically Signed   By: Franchot Gallo M.D.   On: 03/15/2021 18:27    Procedures Procedures   Medications Ordered in ED Medications  albuterol (VENTOLIN HFA) 108 (90 Base) MCG/ACT inhaler 1 puff (1 puff Inhalation Given 03/15/21 2221)    ED Course  I have reviewed the triage vital signs and the nursing notes.  Pertinent labs & imaging results that were available during my care of the patient were reviewed by me and considered in my medical decision making (see chart for details).    MDM Rules/Calculators/A&P                          Initial impression-presents with URI-like symptoms.  She is alert, does not appear acute stress, vital signs are reassuring.  Triage obtain respiratory panel.  Work-up-respiratory panel positive for influenza B, chest x-ray unremarkable.  Rule out-  Low suspicion for systemic infection as patient is nontoxic-appearing, vital signs reassuring, no obvious source infection noted on exam.  Low suspicion for pneumonia as lung sounds are clear bilaterally, x-ray did not reveal any acute findings.  I have low suspicion for PE as patient denies pleuritic chest pain, shortness of breath, vital signs reassuring, nontachypneic, nonhypoxic, nontachycardic . low suspicion for strep throat as oropharynx was visualized, no erythema or  exudates noted.  Low suspicion patient would need  hospitalized due to viral infection or Covid as vital signs reassuring, patient is not in respiratory distress.    Plan-  Influenza A- patient is  outside the 48-hour  window for treatments but she is immunocompromise as she is getting treatment for her RA, which patient has a high risk for adverse outcome.  will start her on the antiviral treatment, recommend over-the-counter pain medications, gave her strict return precautions.  Vital signs have remained stable, no indication for hospital admission.  Patient discussed with attending and they agreed with assessment and plan.  Patient given at home care as well strict return precautions.  Patient verbalized that they understood agreed to said plan.  Final Clinical Impression(s) / ED Diagnoses Final diagnoses:  Influenza    Rx / DC Orders ED Discharge Orders          Ordered    oseltamivir (TAMIFLU) 75 MG capsule  Every 12 hours        03/15/21 2231             Carroll Sage, PA-C 03/15/21 2231    Derwood Kaplan, MD 03/17/21 [phone]

## 2021-03-16 ENCOUNTER — Telehealth: Payer: Self-pay

## 2021-03-16 NOTE — Telephone Encounter (Signed)
Transition Care Management Unsuccessful Follow-up Telephone Call  Date of discharge and from where:  03/15/2021 from [facility]  Attempts:  1st Attempt  Reason for unsuccessful TCM follow-up call:  Left voice message    

## 2021-03-17 NOTE — Telephone Encounter (Signed)
Transition Care Management Unsuccessful Follow-up Telephone Call  Date of discharge and from where:  03/15/21 from [facility]  Attempts:  2nd Attempt  Reason for unsuccessful TCM follow-up call:  Left voice message

## 2021-03-20 NOTE — Telephone Encounter (Signed)
Transition Care Management Unsuccessful Follow-up Telephone Call  Date of discharge and from where:  03/15/2021 from [facility]  Attempts:  3rd Attempt  Reason for unsuccessful TCM follow-up call:  Unable to reach patient

## 2021-03-24 DIAGNOSIS — F419 Anxiety disorder, unspecified: Secondary | ICD-10-CM | POA: Diagnosis not present

## 2021-03-24 DIAGNOSIS — M25552 Pain in left hip: Secondary | ICD-10-CM | POA: Diagnosis not present

## 2021-03-24 DIAGNOSIS — Z79891 Long term (current) use of opiate analgesic: Secondary | ICD-10-CM | POA: Diagnosis not present

## 2021-03-24 DIAGNOSIS — M5416 Radiculopathy, lumbar region: Secondary | ICD-10-CM | POA: Diagnosis not present

## 2021-03-24 DIAGNOSIS — I10 Essential (primary) hypertension: Secondary | ICD-10-CM | POA: Diagnosis not present

## 2021-03-24 DIAGNOSIS — M545 Low back pain, unspecified: Secondary | ICD-10-CM | POA: Diagnosis not present

## 2021-03-24 DIAGNOSIS — M069 Rheumatoid arthritis, unspecified: Secondary | ICD-10-CM | POA: Diagnosis not present

## 2021-03-24 DIAGNOSIS — G603 Idiopathic progressive neuropathy: Secondary | ICD-10-CM | POA: Diagnosis not present

## 2021-04-03 ENCOUNTER — Other Ambulatory Visit: Payer: Self-pay

## 2021-04-03 ENCOUNTER — Encounter: Payer: Self-pay | Admitting: Nurse Practitioner

## 2021-04-03 ENCOUNTER — Ambulatory Visit: Payer: Medicaid Other | Admitting: Nurse Practitioner

## 2021-04-03 VITALS — BP 152/85 | HR 76 | Temp 97.7°F | Ht 67.0 in | Wt 178.0 lb

## 2021-04-03 DIAGNOSIS — K2100 Gastro-esophageal reflux disease with esophagitis, without bleeding: Secondary | ICD-10-CM

## 2021-04-03 DIAGNOSIS — E782 Mixed hyperlipidemia: Secondary | ICD-10-CM | POA: Diagnosis not present

## 2021-04-03 DIAGNOSIS — F324 Major depressive disorder, single episode, in partial remission: Secondary | ICD-10-CM | POA: Diagnosis not present

## 2021-04-03 NOTE — Assessment & Plan Note (Signed)
Completed lipid panel with results pending.

## 2021-04-03 NOTE — Patient Instructions (Signed)
Cholesterol Content in Foods ?Cholesterol is a waxy, fat-like substance that helps to carry fat in the blood. The body needs cholesterol in small amounts, but too much cholesterol can cause damage to the arteries and heart. ?What foods have cholesterol? ?Cholesterol is found in animal-based foods, such as meat, seafood, and dairy. Generally, low-fat dairy and lean meats have less cholesterol than full-fat dairy and fatty meats. The milligrams of cholesterol per serving (mg per serving) of common cholesterol-containing foods are listed below. ?Meats and other proteins ?Egg -- one large whole egg has 186 mg. ?Veal shank -- 4 oz (113 g) has 141 mg. ?Lean ground turkey (93% lean) -- 4 oz (113 g) has 118 mg. ?Fat-trimmed lamb loin -- 4 oz (113 g) has 106 mg. ?Lean ground beef (90% lean) -- 4 oz (113 g) has 100 mg. ?Lobster -- 3.5 oz (99 g) has 90 mg. ?Pork loin chops -- 4 oz (113 g) has 86 mg. ?Canned salmon -- 3.5 oz (99 g) has 83 mg. ?Fat-trimmed beef top loin -- 4 oz (113 g) has 78 mg. ?Frankfurter -- 1 frank (3.5 oz or 99 g) has 77 mg. ?Crab -- 3.5 oz (99 g) has 71 mg. ?Roasted chicken without skin, white meat -- 4 oz (113 g) has 66 mg. ?Light bologna -- 2 oz (57 g) has 45 mg. ?Deli-cut turkey -- 2 oz (57 g) has 31 mg. ?Canned tuna -- 3.5 oz (99 g) has 31 mg. ?Bacon -- 1 oz (28 g) has 29 mg. ?Oysters and mussels (raw) -- 3.5 oz (99 g) has 25 mg. ?Mackerel -- 1 oz (28 g) has 22 mg. ?Trout -- 1 oz (28 g) has 20 mg. ?Pork sausage -- 1 link (1 oz or 28 g) has 17 mg. ?Salmon -- 1 oz (28 g) has 16 mg. ?Tilapia -- 1 oz (28 g) has 14 mg. ?Dairy ?Soft-serve ice cream -- ? cup (4 oz or 86 g) has 103 mg. ?Whole-milk yogurt -- 1 cup (8 oz or 245 g) has 29 mg. ?Cheddar cheese -- 1 oz (28 g) has 28 mg. ?American cheese -- 1 oz (28 g) has 28 mg. ?Whole milk -- 1 cup (8 oz or 250 mL) has 23 mg. ?2% milk -- 1 cup (8 oz or 250 mL) has 18 mg. ?Cream cheese -- 1 tablespoon (Tbsp) (14.5 g) has 15 mg. ?Cottage cheese -- ? cup (4 oz or 113  g) has 14 mg. ?Low-fat (1%) milk -- 1 cup (8 oz or 250 mL) has 10 mg. ?Sour cream -- 1 Tbsp (12 g) has 8.5 mg. ?Low-fat yogurt -- 1 cup (8 oz or 245 g) has 8 mg. ?Nonfat Greek yogurt -- 1 cup (8 oz or 228 g) has 7 mg. ?Half-and-half cream -- 1 Tbsp (15 mL) has 5 mg. ?Fats and oils ?Cod liver oil -- 1 tablespoon (Tbsp) (13.6 g) has 82 mg. ?Butter -- 1 Tbsp (14 g) has 15 mg. ?Lard -- 1 Tbsp (12.8 g) has 14 mg. ?Bacon grease -- 1 Tbsp (12.9 g) has 14 mg. ?Mayonnaise -- 1 Tbsp (13.8 g) has 5-10 mg. ?Margarine -- 1 Tbsp (14 g) has 3-10 mg. ?The items listed above may not be a complete list of foods with cholesterol. Exact amounts of cholesterol in these foods may vary depending on specific ingredients and brands. Contact a dietitian for more information. ?What foods do not have cholesterol? ?Most plant-based foods do not have cholesterol unless you combine them with a food that has cholesterol.   Foods without cholesterol include: ?Grains and cereals. ?Vegetables. ?Fruits. ?Vegetable oils, such as olive, canola, and sunflower oil. ?Legumes, such as peas, beans, and lentils. ?Nuts and seeds. ?Egg whites. ?The items listed above may not be a complete list of foods that do not have cholesterol. Contact a dietitian for more information. ?Summary ?The body needs cholesterol in small amounts, but too much cholesterol can cause damage to the arteries and heart. ?Cholesterol is found in animal-based foods, such as meat, seafood, and dairy. Generally, low-fat dairy and lean meats have less cholesterol than full-fat dairy and fatty meats. ?This information is not intended to replace advice given to you by your health care provider. Make sure you discuss any questions you have with your health care provider. ?Document Revised: 09/09/2020 Document Reviewed: 09/09/2020 ?Elsevier Patient Education ? 2022 [employer] ? ?

## 2021-04-03 NOTE — Assessment & Plan Note (Addendum)
Depression is improving and no changes needed with current dose. Completed PHQ-9 . Follow in in 6 weeks  Anxiety is elevated due to upcoming holiday season per patient. Advised stress management and delegation with holiday cooking. Patient verbalize understanding.

## 2021-04-03 NOTE — Progress Notes (Signed)
Established Patient Office Visit  Subjective:  Patient ID: Kaitlin Fleming, female    DOB: 1960-07-28  Age: 60 y.o. MRN: [id number]  CC:  Chief Complaint  Patient presents with   3 month follow up, cholesterol    HPI Kaitlin Fleming presents for Mixed hyperlipidemia  Pt presents with hyperlipidemia. Patient was diagnosed in 03/04/2021. Compliance with treatment has been { Diet and exeresis), maintains a low cholesterol diet , follows up as directed , and maintains an exercise regimen . The patient denies experiencing any hypercholesterolemia related symptoms.    Depression, Follow-up  She  was last seen for this 3 months ago. Changes made at last visit include . No changes from ;last visit   She reports excellent compliance with treatment. She is not having side effects.   She reports excellent tolerance of treatment. Current symptoms include: depressed mood and difficulty concentrating She feels she is Improved since last visit.  Depression screen [facility] 2/9 04/03/2021 11/03/2020 09/09/2020  Decreased Interest 0 0 0  Down, Depressed, Hopeless 0 0 0  PHQ - 2 Score 0 0 0  Altered sleeping 0 - 0  Tired, decreased energy 0 - 0  Change in appetite 0 - 0  Feeling bad or failure about yourself  0 - 0  Trouble concentrating 0 - 0  Moving slowly or fidgety/restless 0 - 0  Suicidal thoughts 0 - 0  PHQ-9 Score 0 - 0  Difficult doing work/chores Very difficult - -    Anxiety, Follow-up  She was last seen for anxiety 3 months ago. Changes made at last visit include no changes to medication in the past 3 months.   She reports excellent compliance with treatment. She reports excellent tolerance of treatment. She is not having side effects.   She feels her anxiety is mild and Improved since last visit.  Symptoms: No chest pain No difficulty concentrating  No dizziness No fatigue  No feelings of losing control No insomnia  No irritable No palpitations  No panic attacks No racing  thoughts  No shortness of breath No sweating  No tremors/shakes    GAD-7 Results GAD-7 Generalized Anxiety Disorder Screening Tool 04/03/2021 06/09/2020  1. Feeling Nervous, Anxious, or on Edge 0 0  2. Not Being Able to Stop or Control Worrying 0 2  3. Worrying Too Much About Different Things 1 2  4. Trouble Relaxing 1 1  5. Being So Restless it's Hard To Sit Still 0 0  6. Becoming Easily Annoyed or Irritable 0 1  7. Feeling Afraid As If Something Awful Might Happen 0 0  Total GAD-7 Score 2 6  Difficulty At Work, Home, or Getting  Along With Others? Very difficult Very difficult    PHQ-9 Scores PHQ9 SCORE ONLY 04/03/2021 11/03/2020 09/09/2020  PHQ-9 Total Score 0 0 0       Past Medical History:  Diagnosis Date   Allergy to alpha-gal    Chronic back pain    stenosis   Chronic pain    takes Lexapro daily   Complication of anesthesia    pt states after surgery in 2000 had to wear a heart monitor for 3 days.   DDD (degenerative disc disease)    Depression    GERD (gastroesophageal reflux disease)    takes OTC meds if needed   History of blood transfusion    as a child   History of bronchitis 71yr ago   Hyperlipidemia    takes Fenofibrate daily   Joint  pain    Joint swelling    Kidney stone    has one right now on the right side but not giving any problems   Osteoarthritis    Peripheral edema    takes HCTZ daily    Pneumonia 10yr ago   hx of   PONV (postoperative nausea and vomiting)    Rheumatoid arthritis (HCC)    Rheumatoid arthritis (HCC)    Weakness    and tingling on left side    Past Surgical History:  Procedure Laterality Date   ABDOMINAL HYSTERECTOMY  2000   BACK SURGERY  03/10/14   lumbar fusion   LUMBAR LAMINECTOMY/DECOMPRESSION MICRODISCECTOMY Left 12/08/2013   Procedure: LEFT LUMBAR FOUR-FIVEW, LUMBAR FIVE-SACRAL ONE LAMINECTOMY;  Surgeon: EFloyce Stakes MD;  Location: [facility];  Service: Neurosurgery;  Laterality: Left;   TUBAL LIGATION   24 yrs ago    Family History  Problem Relation Age of Onset   Colon cancer Maternal Grandmother    Colon cancer Paternal Uncle     Social History   Socioeconomic History   Marital status: Married    Spouse name: Not on file   Number of children: Not on file   Years of education: Not on file   Highest education level: Not on file  Occupational History   Not on file  Tobacco Use   Smoking status: Never   Smokeless tobacco: Never  Vaping Use   Vaping Use: Never used  Substance and Sexual Activity   Alcohol use: No   Drug use: No   Sexual activity: Yes    Birth control/protection: Surgical  Other Topics Concern   Not on file  Social History Narrative   Not on file   Social Determinants of Health   Financial Resource Strain: Not on file  Food Insecurity: Not on file  Transportation Needs: Not on file  Physical Activity: Not on file  Stress: Not on file  Social Connections: Not on file  Intimate Partner Violence: Not on file    Outpatient Medications Prior to Visit  Medication Sig Dispense Refill   acetaminophen (TYLENOL) 500 MG tablet Take 1 tablet (500 mg total) by mouth every 6 (six) hours as needed. 30 tablet 0   fluticasone (FLONASE) 50 MCG/ACT nasal spray Place 2 sprays into both nostrils daily. 16 g 6   loratadine (CLARITIN) 10 MG tablet Take 1 tablet (10 mg total) by mouth at bedtime. 30 tablet 1   ondansetron (ZOFRAN) 4 MG tablet Take 1 tablet (4 mg total) by mouth every 8 (eight) hours as needed for nausea or vomiting. 20 tablet 0   oxyCODONE-acetaminophen (PERCOCET) 10-325 MG tablet Take 1 tablet by mouth every 6 (six) hours as needed for pain.      pantoprazole (PROTONIX) 40 MG tablet Take 1 tablet (40 mg total) by mouth 2 (two) times daily before a meal. 60 tablet 5   predniSONE (DELTASONE) 5 MG tablet Take 1 tablet (5 mg total) by mouth daily. 30 tablet 1   sertraline (ZOLOFT) 50 MG tablet Take 1 tablet (50 mg total) by mouth daily. 90 tablet 3    Upadacitinib ER (RINVOQ) 15 MG TB24 Take 1 tablet by mouth daily.     No facility-administered medications prior to visit.    Allergies  Allergen Reactions   Macrobid [Nitrofurantoin] Shortness Of Breath and Rash   Macrolides And Ketolides    Methotrexate    Remicade [Infliximab]    Tofacitinib Other (See Comments)  Upper respiratory infections   Ibuprofen Hives   Penicillins Other (See Comments)    Breaks out Has patient had a PCN reaction causing immediate rash, facial/tongue/throat swelling, SOB or lightheadedness with hypotension: yes Has patient had a PCN reaction causing severe rash involving mucus membranes or skin necrosis: no Has patient had a PCN reaction that required hospitalization: no Has patient had a PCN reaction occurring within the last 10 years:no If all of the above answers are "NO", then may proceed with Cephalosporin use.   Sulfa Antibiotics Other (See Comments)    Breaks out    ROS Review of Systems  Constitutional: Negative.   HENT: Negative.    Eyes: Negative.   Respiratory: Negative.    Gastrointestinal: Negative.   Genitourinary: Negative.   Skin:  Negative for rash.  Psychiatric/Behavioral:  The patient is nervous/anxious.   All other systems reviewed and are negative.    Objective:    Physical Exam Vitals and nursing note reviewed.  Constitutional:      Appearance: Normal appearance.  HENT:     Head: Normocephalic.     Right Ear: External ear normal.     Left Ear: External ear normal.     Nose: Nose normal.     Mouth/Throat:     Pharynx: Oropharynx is clear.  Eyes:     Conjunctiva/sclera: Conjunctivae normal.  Cardiovascular:     Rate and Rhythm: Normal rate and regular rhythm.  Pulmonary:     Effort: Pulmonary effort is normal.     Breath sounds: Normal breath sounds.  Abdominal:     General: Bowel sounds are normal.  Skin:    General: Skin is warm.     Findings: No rash.  Neurological:     Mental Status: She is alert and  oriented to person, place, and time.  Psychiatric:        Attention and Perception: Attention and perception normal.        Mood and Affect: Mood and affect normal.        Speech: Speech normal.        Behavior: Behavior is cooperative.        Thought Content: Thought content normal.        Cognition and Memory: Cognition normal.        Judgment: Judgment normal.    BP (!) 152/85   Pulse 76   Temp 97.7 F (36.5 C)   Ht '5\' 7"'  (1.702 m)   Wt 178 lb (80.7 kg)   SpO2 95%   BMI 27.88 kg/m  Wt Readings from Last 3 Encounters:  04/03/21 178 lb (80.7 kg)  03/15/21 170 lb (77.1 kg)  01/27/21 185 lb 9.6 oz (84.2 kg)     Health Maintenance Due  Topic Date Due   Pneumococcal Vaccine 45-71 Years old (1 - PCV) Never done   TETANUS/TDAP  Never done   Zoster Vaccines- Shingrix (1 of 2) Never done   COLONOSCOPY (Pts 45-42yr Insurance coverage will need to be confirmed)  Never done   MAMMOGRAM  Never done   INFLUENZA VACCINE  Never done    There are no preventive care reminders to display for this patient.  Lab Results  Component Value Date   TSH 2.030 12/30/2020   Lab Results  Component Value Date   WBC 5.6 12/30/2020   HGB 12.8 12/30/2020   HCT 39.3 12/30/2020   MCV 85 12/30/2020   PLT 211 12/30/2020   Lab Results  Component Value Date  NA 143 12/30/2020   K 3.8 12/30/2020   CO2 29 12/30/2020   GLUCOSE 67 12/30/2020   BUN 6 (L) 12/30/2020   CREATININE 0.66 12/30/2020   BILITOT 0.3 12/30/2020   ALKPHOS 117 12/30/2020   AST 13 12/30/2020   ALT 10 12/30/2020   PROT 6.9 12/30/2020   ALBUMIN 4.2 12/30/2020   CALCIUM 9.2 12/30/2020   ANIONGAP 11 06/11/2020   EGFR 100 12/30/2020   Lab Results  Component Value Date   CHOL 248 (H) 12/30/2020   Lab Results  Component Value Date   HDL 51 12/30/2020   Lab Results  Component Value Date   LDLCALC 160 (H) 12/30/2020   Lab Results  Component Value Date   TRIG 204 (H) 12/30/2020   Lab Results  Component Value  Date   CHOLHDL 4.9 (H) 12/30/2020   No results found for: HGBA1C    Assessment & Plan:   Problem List Items Addressed This Visit       Digestive   GERD (gastroesophageal reflux disease) - Primary    Symptoms well controlled. Patient is not taking medication as frequently and losing a few pounds has helped with symptoms management.       Relevant Orders   CBC with Differential     Other   Elevated triglycerides with high cholesterol    Completed lipid panel with results pending.      Relevant Orders   Lipid Panel   Comprehensive metabolic panel   Depression, major, single episode, in partial remission (HCC)    Depression is improving and no changes needed with current dose. Completed PHQ-9 . Follow in in 6 weeks  Anxiety is elevated due to upcoming holiday season per patient. Advised stress management and delegation with holiday cooking. Patient verbalize understanding.      Other Visit Diagnoses     Calcium blood increased       Relevant Orders   Vitamin D, 25-hydroxy       No orders of the defined types were placed in this encounter.   Follow-up: Return in about 3 months (around 07/04/2021).    Kaitlin Lynn, NP

## 2021-04-03 NOTE — Assessment & Plan Note (Signed)
Symptoms well controlled. Patient is not taking medication as frequently and losing a few pounds has helped with symptoms management.

## 2021-04-04 ENCOUNTER — Other Ambulatory Visit: Payer: Self-pay | Admitting: Nurse Practitioner

## 2021-04-04 LAB — CBC WITH DIFFERENTIAL/PLATELET
Basophils Absolute: 0 10*3/uL (ref 0.0–0.2)
Basos: 0 %
EOS (ABSOLUTE): 0.1 10*3/uL (ref 0.0–0.4)
Eos: 1 %
Hematocrit: 39.6 % (ref 34.0–46.6)
Hemoglobin: 12.8 g/dL (ref 11.1–15.9)
Immature Grans (Abs): 0 10*3/uL (ref 0.0–0.1)
Immature Granulocytes: 0 %
Lymphocytes Absolute: 0.7 10*3/uL (ref 0.7–3.1)
Lymphs: 10 %
MCH: 27 pg (ref 26.6–33.0)
MCHC: 32.3 g/dL (ref 31.5–35.7)
MCV: 84 fL (ref 79–97)
Monocytes Absolute: 0.5 10*3/uL (ref 0.1–0.9)
Monocytes: 7 %
Neutrophils Absolute: 6.1 10*3/uL (ref 1.4–7.0)
Neutrophils: 82 %
Platelets: 221 10*3/uL (ref 150–450)
RBC: 4.74 x10E6/uL (ref 3.77–5.28)
RDW: 13.2 % (ref 11.7–15.4)
WBC: 7.4 10*3/uL (ref 3.4–10.8)

## 2021-04-04 LAB — COMPREHENSIVE METABOLIC PANEL
ALT: 13 IU/L (ref 0–32)
AST: 15 IU/L (ref 0–40)
Albumin/Globulin Ratio: 1.6 (ref 1.2–2.2)
Albumin: 4.3 g/dL (ref 3.8–4.8)
Alkaline Phosphatase: 112 IU/L (ref 44–121)
BUN/Creatinine Ratio: 8 — ABNORMAL LOW (ref 12–28)
BUN: 6 mg/dL — ABNORMAL LOW (ref 8–27)
Bilirubin Total: 0.3 mg/dL (ref 0.0–1.2)
CO2: 26 mmol/L (ref 20–29)
Calcium: 9.1 mg/dL (ref 8.7–10.3)
Chloride: 101 mmol/L (ref 96–106)
Creatinine, Ser: 0.74 mg/dL (ref 0.57–1.00)
Globulin, Total: 2.7 g/dL (ref 1.5–4.5)
Glucose: 99 mg/dL (ref 70–99)
Potassium: 3.8 mmol/L (ref 3.5–5.2)
Sodium: 140 mmol/L (ref 134–144)
Total Protein: 7 g/dL (ref 6.0–8.5)
eGFR: 92 mL/min/{1.73_m2} (ref >59)

## 2021-04-04 LAB — LIPID PANEL
Chol/HDL Ratio: 5 ratio — ABNORMAL HIGH (ref 0.0–4.4)
Cholesterol, Total: 257 mg/dL — ABNORMAL HIGH (ref 100–199)
HDL: 51 mg/dL (ref >39)
LDL Chol Calc (NIH): 174 mg/dL — ABNORMAL HIGH (ref 0–99)
Triglycerides: 176 mg/dL — ABNORMAL HIGH (ref 0–149)
VLDL Cholesterol Cal: 32 mg/dL (ref 5–40)

## 2021-04-04 LAB — VITAMIN D 25 HYDROXY (VIT D DEFICIENCY, FRACTURES): Vit D, 25-Hydroxy: 11.8 ng/mL — ABNORMAL LOW (ref 30.0–100.0)

## 2021-04-04 MED ORDER — VITAMIN D (ERGOCALCIFEROL) 1.25 MG (50000 UNIT) PO CAPS
50000.0000 [IU] | ORAL_CAPSULE | ORAL | 0 refills | Status: DC
Start: 2021-04-04 — End: 2022-02-19

## 2021-04-19 ENCOUNTER — Other Ambulatory Visit: Payer: Self-pay | Admitting: Nurse Practitioner

## 2021-04-19 MED ORDER — CHOLESTYRAMINE 4 G PO PACK: 4.0000 g | PACK | Freq: Two times a day (BID) | ORAL | 2 refills | Status: DC

## 2021-05-29 ENCOUNTER — Ambulatory Visit: Payer: Medicaid Other | Admitting: Gastroenterology

## 2021-06-09 DIAGNOSIS — H10023 Other mucopurulent conjunctivitis, bilateral: Secondary | ICD-10-CM | POA: Diagnosis not present

## 2021-06-09 DIAGNOSIS — J329 Chronic sinusitis, unspecified: Secondary | ICD-10-CM | POA: Diagnosis not present

## 2021-07-05 DIAGNOSIS — M0579 Rheumatoid arthritis with rheumatoid factor of multiple sites without organ or systems involvement: Secondary | ICD-10-CM | POA: Diagnosis not present

## 2021-07-05 DIAGNOSIS — M069 Rheumatoid arthritis, unspecified: Secondary | ICD-10-CM | POA: Diagnosis not present

## 2021-07-05 DIAGNOSIS — M7989 Other specified soft tissue disorders: Secondary | ICD-10-CM | POA: Diagnosis not present

## 2021-07-05 DIAGNOSIS — Z79899 Other long term (current) drug therapy: Secondary | ICD-10-CM | POA: Diagnosis not present

## 2021-07-20 DIAGNOSIS — M0579 Rheumatoid arthritis with rheumatoid factor of multiple sites without organ or systems involvement: Secondary | ICD-10-CM | POA: Diagnosis not present

## 2021-07-20 DIAGNOSIS — Z79899 Other long term (current) drug therapy: Secondary | ICD-10-CM | POA: Diagnosis not present

## 2021-07-20 DIAGNOSIS — Z7952 Long term (current) use of systemic steroids: Secondary | ICD-10-CM | POA: Diagnosis not present

## 2021-07-20 DIAGNOSIS — M810 Age-related osteoporosis without current pathological fracture: Secondary | ICD-10-CM | POA: Diagnosis not present

## 2021-07-22 ENCOUNTER — Other Ambulatory Visit: Payer: Self-pay | Admitting: Gastroenterology

## 2021-07-24 NOTE — Telephone Encounter (Signed)
Last office visit 01/27/21 ?

## 2021-07-31 DIAGNOSIS — M0579 Rheumatoid arthritis with rheumatoid factor of multiple sites without organ or systems involvement: Secondary | ICD-10-CM | POA: Diagnosis not present

## 2021-07-31 DIAGNOSIS — Z7952 Long term (current) use of systemic steroids: Secondary | ICD-10-CM | POA: Diagnosis not present

## 2021-07-31 DIAGNOSIS — M810 Age-related osteoporosis without current pathological fracture: Secondary | ICD-10-CM | POA: Diagnosis not present

## 2021-07-31 DIAGNOSIS — Z79899 Other long term (current) drug therapy: Secondary | ICD-10-CM | POA: Diagnosis not present

## 2021-08-14 DIAGNOSIS — H10232 Serous conjunctivitis, except viral, left eye: Secondary | ICD-10-CM | POA: Diagnosis not present

## 2021-08-14 DIAGNOSIS — J0100 Acute maxillary sinusitis, unspecified: Secondary | ICD-10-CM | POA: Diagnosis not present

## 2021-08-14 DIAGNOSIS — J040 Acute laryngitis: Secondary | ICD-10-CM | POA: Diagnosis not present

## 2021-08-15 DIAGNOSIS — M545 Low back pain, unspecified: Secondary | ICD-10-CM | POA: Diagnosis not present

## 2021-08-15 DIAGNOSIS — Z79899 Other long term (current) drug therapy: Secondary | ICD-10-CM | POA: Diagnosis not present

## 2021-08-15 DIAGNOSIS — G603 Idiopathic progressive neuropathy: Secondary | ICD-10-CM | POA: Diagnosis not present

## 2021-08-15 DIAGNOSIS — M25552 Pain in left hip: Secondary | ICD-10-CM | POA: Diagnosis not present

## 2021-08-15 DIAGNOSIS — M0579 Rheumatoid arthritis with rheumatoid factor of multiple sites without organ or systems involvement: Secondary | ICD-10-CM | POA: Diagnosis not present

## 2021-09-28 DIAGNOSIS — G603 Idiopathic progressive neuropathy: Secondary | ICD-10-CM | POA: Diagnosis not present

## 2021-09-28 DIAGNOSIS — M545 Low back pain, unspecified: Secondary | ICD-10-CM | POA: Diagnosis not present

## 2021-09-28 DIAGNOSIS — M25552 Pain in left hip: Secondary | ICD-10-CM | POA: Diagnosis not present

## 2021-09-28 DIAGNOSIS — Z79891 Long term (current) use of opiate analgesic: Secondary | ICD-10-CM | POA: Diagnosis not present

## 2021-11-11 HISTORY — PX: BASAL CELL CARCINOMA EXCISION: SHX1214

## 2021-11-30 DIAGNOSIS — M810 Age-related osteoporosis without current pathological fracture: Secondary | ICD-10-CM | POA: Diagnosis not present

## 2021-11-30 DIAGNOSIS — Z79899 Other long term (current) drug therapy: Secondary | ICD-10-CM | POA: Diagnosis not present

## 2021-11-30 DIAGNOSIS — M0579 Rheumatoid arthritis with rheumatoid factor of multiple sites without organ or systems involvement: Secondary | ICD-10-CM | POA: Diagnosis not present

## 2021-11-30 DIAGNOSIS — T781XXA Other adverse food reactions, not elsewhere classified, initial encounter: Secondary | ICD-10-CM | POA: Diagnosis not present

## 2021-12-06 DIAGNOSIS — M8588 Other specified disorders of bone density and structure, other site: Secondary | ICD-10-CM | POA: Diagnosis not present

## 2021-12-13 DIAGNOSIS — L989 Disorder of the skin and subcutaneous tissue, unspecified: Secondary | ICD-10-CM | POA: Diagnosis not present

## 2021-12-19 DIAGNOSIS — L03811 Cellulitis of head [any part, except face]: Secondary | ICD-10-CM | POA: Diagnosis not present

## 2021-12-21 DIAGNOSIS — M545 Low back pain, unspecified: Secondary | ICD-10-CM | POA: Diagnosis not present

## 2021-12-21 DIAGNOSIS — Z79891 Long term (current) use of opiate analgesic: Secondary | ICD-10-CM | POA: Diagnosis not present

## 2021-12-21 DIAGNOSIS — M069 Rheumatoid arthritis, unspecified: Secondary | ICD-10-CM | POA: Diagnosis not present

## 2021-12-21 DIAGNOSIS — M25552 Pain in left hip: Secondary | ICD-10-CM | POA: Diagnosis not present

## 2021-12-21 DIAGNOSIS — G603 Idiopathic progressive neuropathy: Secondary | ICD-10-CM | POA: Diagnosis not present

## 2021-12-25 DIAGNOSIS — T7840XA Allergy, unspecified, initial encounter: Secondary | ICD-10-CM | POA: Diagnosis not present

## 2021-12-25 DIAGNOSIS — T781XXA Other adverse food reactions, not elsewhere classified, initial encounter: Secondary | ICD-10-CM | POA: Diagnosis not present

## 2022-01-02 DIAGNOSIS — L309 Dermatitis, unspecified: Secondary | ICD-10-CM | POA: Diagnosis not present

## 2022-01-02 DIAGNOSIS — Z5189 Encounter for other specified aftercare: Secondary | ICD-10-CM | POA: Diagnosis not present

## 2022-01-03 ENCOUNTER — Ambulatory Visit: Payer: Medicaid Other | Admitting: Nurse Practitioner

## 2022-01-03 ENCOUNTER — Encounter: Payer: Self-pay | Admitting: Nurse Practitioner

## 2022-01-03 VITALS — BP 143/88 | HR 98 | Temp 98.6°F | Ht 67.0 in | Wt 184.2 lb

## 2022-01-03 DIAGNOSIS — R6889 Other general symptoms and signs: Secondary | ICD-10-CM

## 2022-01-03 DIAGNOSIS — R531 Weakness: Secondary | ICD-10-CM

## 2022-01-03 DIAGNOSIS — Z91018 Allergy to other foods: Secondary | ICD-10-CM | POA: Diagnosis not present

## 2022-01-03 NOTE — Patient Instructions (Signed)
Alpha-gal Syndrome Alpha-gal syndrome (AGS) is an allergic reaction to a type of sugar commonly called alpha-gal. It is found in the meat and organ meats of mammals, such as cows, pigs, and sheep. It may also be found in products that come from animals, such as gelatin, medicines, medicine capsules, some milk products, vaccines, and cosmetics. AGS causes an allergic reaction that can be immediate or delayed for several hours and can range from mild to severe. A mild reaction may cause nausea, vomiting, or an itchy rash (hives). A severe reaction can cause breathing difficulties or loss of consciousness (anaphylaxis). This can be life-threatening. What are the causes? This allergy is first triggered by a tick bite from a lone star or blackleg tick. These ticks bite animals, such as cows, pigs, or sheep, and pick up the alpha-gal sugar from their blood. If the same tick bites you, it may cause your body's defense system (immune system) to produce antibodies to alpha-gal and cause the allergic reaction. What increases the risk? People who live in areas of the [place] where the lone star tick is common are at highest risk, these areas may include: [place]. [place]. [place] into parts of [place]. People who are hunters and people who work in jobs that involve caring for trees (foresters) have an increased risk of this condition. What are the signs or symptoms? AGS may not cause an allergic reaction every time you eat red meat or come into contact with alpha-gal. If you do have a reaction, symptoms may include: Hives. Severe stomachache. Nausea or vomiting. Swelling of the lips, face, tongue, or throat. Making a high-pitched whistling sound when you breathe, most often when you breathe out (wheezing). Sneezing and runny nose. Headache. Symptoms of anaphylaxis may include: Difficulty breathing. Difficulty swallowing. Dizziness. Fainting. This may happen due to a sudden drop in  blood pressure. You may have AGS if you had anaphylaxis after eating something but do not have any known food allergies. Unlike other food allergies, the reaction does not start soon after the exposure to alpha-gal. There may be a delay of several hours. How is this diagnosed? This condition may be diagnosed based on signs and symptoms of the condition, especially if you have a history of tick bites and a delayed reaction to red meat. You may also have a blood test to check for antibodies to alpha-gal or a skin test to see if there is a reaction to alpha-gal. How is this treated? This condition may be treated by: Avoiding meat and organ meats that may contain alpha-gal. Avoiding medicines or other products that may contain alpha-gal. Using medicines to reduce an allergic reaction. Carrying an epinephrine auto-injector to use in case of a severe AGS reaction. AGS should be treated by an allergist or a health care provider who has experience with AGS. Follow these instructions at home:  Medicines Take over-the-counter and prescription medicines only as told by your health care provider. Follow instructions from your health care provider about when and how to use an epinephrine auto-injector. General instructions Avoid red meat and organ meat, and check food labels for meat-based ingredients in packaged foods such as soup, gravy, and flavoring. Work with your allergist to find what other foods or products you may need to avoid, some people may benefit from avoiding dairy. Keep all follow-up visits. This is important. How is this prevented? Take steps to prevent tick bites as frequent tick bites may increase the risk of an AGS reaction. These steps   include: Avoiding woods and fields with high grass. Wearing clothing protected with the anti-tick chemical permethrin when outdoors in these areas or using a U.S. Environmental Protection Agency-approved insect repellent. Checking your clothing for  ticks before you come back indoors. Checking your body for ticks when you take a shower. Checking your pets for ticks. Where to find more information Centers for Disease Control and Prevention: www.cdc.gov National Institute of Allergy and Infectious Diseases: www.niaid.nih.gov Contact a health care provider if: You have any signs or symptoms of AGS food allergy. You have a tick bite that causes a skin reaction. Get help right away if: You have a severe AGS reaction. You have trouble swallowing or breathing. These symptoms may represent a serious problem that is an emergency. Do not wait to see if the symptoms will go away. Get medical help right away. Call your local emergency services (911 in the [place] Do not drive yourself to the hospital. Summary AGS is a food allergy caused by a tick bite. Red meats, organ meats, and other products or medicines may trigger the alpha-gal reaction. AGS reactions can be mild or severe. If you have AGS, you should not eat red meat, organ meat, or products that may contain alpha-gal. Avoiding tick bites prevents AGS and more serious AGS reactions. This information is not intended to replace advice given to you by your health care provider. Make sure you discuss any questions you have with your health care provider. Document Revised: 08/16/2020 Document Reviewed: 08/16/2020 Elsevier Patient Education  2023 Elsevier Inc.  

## 2022-01-03 NOTE — Progress Notes (Addendum)
Acute Office Visit  Subjective:     Patient ID: Kaitlin Fleming, female    DOB: 01-Jul-1960, 61 y.o.   MRN: [id number]  Chief Complaint  Patient presents with   Allergic Reaction   Allergic Reaction This is a recurrent problem. The current episode started more than 1 week ago. The problem occurs constantly. The problem has been gradually worsening since onset. The problem is moderate. The patient was exposed to insect bite (Alpha gal). The exposure occurred at Home. Associated symptoms include a rash. Pertinent negatives include no coughing, diarrhea, eye redness or itching. Past treatments include nothing.  Extremity Weakness  This is a recurrent problem. The current episode started more than 1 month ago. The problem occurs constantly. The problem has been gradually worsening. The quality of the pain is described as aching. The pain is mild. Pertinent negatives include no fever or itching. The treatment provided no relief.   Patient has a history of alpha gal and symptoms are worse    Review of Systems  Constitutional:  Negative for fever.  Eyes:  Negative for redness.  Respiratory:  Negative for cough.   Gastrointestinal:  Negative for diarrhea.  Musculoskeletal:  Positive for extremity weakness.  Skin:  Positive for rash. Negative for itching.  All other systems reviewed and are negative.       Objective:    BP (!) 143/88   Pulse 98   Temp 98.6 F (37 C)   Ht 5\' 7"  (1.702 m)   Wt 184 lb 3.2 oz (83.6 kg)   SpO2 97%   BMI 28.85 kg/m  BP Readings from Last 3 Encounters:  01/03/22 (!) 143/88  04/03/21 (!) 152/85  03/15/21 (!) 155/90   Wt Readings from Last 3 Encounters:  01/03/22 184 lb 3.2 oz (83.6 kg)  04/03/21 178 lb (80.7 kg)  03/15/21 170 lb (77.1 kg)      Physical Exam Vitals and nursing note reviewed.  Constitutional:      Appearance: Normal appearance.  HENT:     Head: Normocephalic.     Right Ear: External ear normal.     Left Ear: External ear  normal.     Nose: Nose normal.     Mouth/Throat:     Mouth: Mucous membranes are moist.     Pharynx: Oropharynx is clear.  Eyes:     Conjunctiva/sclera: Conjunctivae normal.  Cardiovascular:     Rate and Rhythm: Normal rate and regular rhythm.     Pulses: Normal pulses.     Heart sounds: Normal heart sounds.  Pulmonary:     Effort: Pulmonary effort is normal.     Breath sounds: Normal breath sounds.  Abdominal:     General: Bowel sounds are normal.  Skin:    General: Skin is warm.     Findings: Rash present. No erythema.  Neurological:     General: No focal deficit present.     Mental Status: She is alert and oriented to person, place, and time.  Psychiatric:        Behavior: Behavior normal.     No results found for any visits on 01/03/22.      Assessment & Plan:  Patient presents with extremity weakness and fatigue in the past few months.  History of alpha gal and recent tick bites.  Patient is requesting allergy clinic referral referral and repeat Lyme disease and alpha gal labs..  Completed referral, labs completed results pending. Education provided to patient printed handouts given.  Patient  is well aware of foods that trigger allergic reactions.  And knows to stay away from it.  Follow-up with worsening unresolved symptoms. Problem List Items Addressed This Visit       Other   Allergy to alpha-gal   Relevant Orders   Ambulatory referral to Allergy   Other Visit Diagnoses     Weakness    -  Primary   Relevant Orders   Lyme Disease Serology w/Reflex   Alpha-Gal Panel   Ambulatory referral to Allergy   Flu-like symptoms       Relevant Orders   Lyme Disease Serology w/Reflex   Alpha-Gal Panel   Ambulatory referral to Allergy       No orders of the defined types were placed in this encounter.   Return if symptoms worsen or fail to improve.  Daryll Drown, NP

## 2022-01-05 LAB — LYME DISEASE SEROLOGY W/REFLEX: Lyme Total Antibody EIA: NEGATIVE

## 2022-01-05 LAB — ALPHA-GAL PANEL
Allergen Lamb IgE: 7.26 kU/L — AB
Beef IgE: 10.7 kU/L — AB
IgE (Immunoglobulin E), Serum: 30 IU/mL (ref 6–495)
O215-IgE Alpha-Gal: 21.9 kU/L — AB
Pork IgE: 8.62 kU/L — AB

## 2022-01-19 ENCOUNTER — Encounter: Payer: Self-pay | Admitting: Nurse Practitioner

## 2022-01-19 ENCOUNTER — Ambulatory Visit: Payer: Medicaid Other | Admitting: Nurse Practitioner

## 2022-01-19 VITALS — BP 158/90 | HR 75 | Temp 98.4°F | Ht 67.0 in | Wt 181.0 lb

## 2022-01-19 DIAGNOSIS — L989 Disorder of the skin and subcutaneous tissue, unspecified: Secondary | ICD-10-CM

## 2022-01-19 NOTE — Progress Notes (Signed)
Acute Office Visit  Subjective:     Patient ID: Kaitlin Fleming, female    DOB: Sep 17, 1960, 61 y.o.   MRN: [id number]  Chief Complaint  Patient presents with   Mass    On head - gotten bigger - 1st noticed back in march     Rash This is a new problem. The current episode started more than 1 month ago. The problem is unchanged. The affected locations include the scalp. The rash is characterized by dryness, itchiness and redness. Associated with: Started from hair salon. Pertinent negatives include no congestion, cough, fever, joint pain, shortness of breath or sore throat. Treatments tried: Alpha gal sample, antibiotics. The treatment provided mild relief.     Review of Systems  Constitutional: Negative.  Negative for chills, fever and malaise/fatigue.  HENT: Negative.  Negative for congestion and sore throat.   Respiratory: Negative.  Negative for cough and shortness of breath.   Cardiovascular: Negative.   Gastrointestinal: Negative.  Negative for heartburn and nausea.  Genitourinary: Negative.   Musculoskeletal:  Negative for joint pain.  Skin:  Positive for itching and rash.       Lesion on scalp  All other systems reviewed and are negative.       Objective:    BP (!) 158/90   Pulse 75   Temp 98.4 F (36.9 C)   Ht 5\' 7"  (1.702 m)   Wt 181 lb (82.1 kg)   SpO2 97%   BMI 28.35 kg/m  BP Readings from Last 3 Encounters:  01/19/22 (!) 158/90  01/03/22 (!) 143/88  04/03/21 (!) 152/85   Wt Readings from Last 3 Encounters:  01/19/22 181 lb (82.1 kg)  01/03/22 184 lb 3.2 oz (83.6 kg)  04/03/21 178 lb (80.7 kg)      Physical Exam Vitals and nursing note reviewed.  Constitutional:      Appearance: Normal appearance.  HENT:     Head: Normocephalic.      Comments: Lesion on scalp    Right Ear: External ear normal.     Nose: Nose normal.     Mouth/Throat:     Pharynx: Oropharynx is clear.  Eyes:     Conjunctiva/sclera: Conjunctivae normal.  Cardiovascular:      Rate and Rhythm: Normal rate and regular rhythm.     Pulses: Normal pulses.     Heart sounds: Normal heart sounds.  Pulmonary:     Effort: Pulmonary effort is normal.     Breath sounds: Normal breath sounds.  Abdominal:     General: Bowel sounds are normal.  Skin:    Findings: Erythema and rash present.  Neurological:     General: No focal deficit present.     Mental Status: She is alert and oriented to person, place, and time.  Psychiatric:        Behavior: Behavior normal.     No results found for any visits on 01/19/22.      Assessment & Plan:  Patient presents with lesion on scalp, symptoms present in the past few weeks and unresolved.  Lesion will sometimes flareup with erythema, tenderness and pruritus.  History of alpha gal.  Patient reports using alpha gal shampoo that provided some relief.  Referral to dermatology to rule out any cancerous, Continue alpha gal shampoo, antihistamine as needed  Follow-up for worsening unresolved symptoms. Problem List Items Addressed This Visit   None Visit Diagnoses     Skin lesion of scalp    -  Primary  Relevant Orders   Ambulatory referral to Dermatology       No orders of the defined types were placed in this encounter.   Return if symptoms worsen or fail to improve.  Daryll Drown, NP

## 2022-01-19 NOTE — Patient Instructions (Signed)
Actinic Keratosis An actinic keratosis is a precancerous growth on the skin. If there is more than one, the condition is called actinic keratoses. These growths appear most often on parts of the skin that get a lot of sun exposure, including the: Scalp. Face. Ears. Lips. Upper back. Forearms. Backs of the hands. If left untreated, these growths may develop into a skin cancer called squamous cell carcinoma. It is important to have all these growths checked by a health care provider to determine the best treatment. What are the causes? Actinic keratoses are caused by getting too much ultraviolet (UV) radiation from the sun or other UV light sources. What increases the risk? You are more likely to develop this condition if you: Have light-colored skin or blue eyes. Have blond or red hair. Spend a lot of time in the sun. Do not protect your skin from the sun when outdoors. Are an older person. The risk of developing an actinic keratosis increases with age. What are the signs or symptoms? These growths feel like scaly, rough spots of skin. Symptoms of this condition include growths that may: Be as small as a pinhead or as big as a quarter. Itch, hurt, or feel sensitive. Be skin-colored, light tan, dark tan, pink, or a combination of these colors. In most cases, the growths become red. Have a small piece of pink or gray skin (skin tag) growing from them. It may be easier to notice the growths by feeling them rather than seeing them. Sometimes, actinic keratoses disappear but may return a few days to a few weeks later. How is this diagnosed? This condition is usually diagnosed with a physical exam. A tissue sample may be removed from the growth and examined under a microscope (biopsy). How is this treated? This condition may be treated by: Scraping off the actinic keratosis (curettage). Freezing the actinic keratosis with liquid nitrogen (cryosurgery). This causes the growth to eventually  fall off. Applying medicated creams or gels to destroy the cells in the growth. Applying chemicals to the growth to make the outer layers of skin peel off (chemical peel). Using photodynamic therapy. In this procedure, medicated cream is applied to the actinic keratosis. This cream increases your skin's sensitivity to light. Then, a strong light is aimed at the actinic keratosis to destroy cells in the growth. Follow these instructions at home: Skin care Apply cool, wet cloths (coolcompresses) to the affected areas. Do not scratch your skin. Check your skin regularly for any growths, especially ones that: Start to itch or bleed. Change in size, shape, or color. Caring for the treated area Keep the treated area clean and dry as told by your health care provider. Do not apply any medicine, cream, or lotion to the treated area unless your health care provider tells you to do that. Do not pick at blisters or try to break them open. This can cause infection and scarring. If you have red or irritated skin after treatment, follow instructions from your health care provider about how to take care of the treated area. Make sure you: Wash your hands with soap and water for at least 20 seconds before and after you change your bandage (dressing). If soap and water are not available, use hand sanitizer. Change your dressing as told by your health care provider. If you have red or irritated skin after treatment, check the treated area every day for signs of infection. Check for: Redness, swelling, or pain. Fluid or blood. Warmth. Pus or a bad   smell. Lifestyle Do not use any products that contain nicotine or tobacco. These products include cigarettes, chewing tobacco, and vaping devices, such as e-cigarettes. If you need help quitting, ask your health care provider. Take steps to protect your skin from the sun, such as: Avoiding the sun between 10:00 a.m. and 4:00 p.m. This is when the UV light is the  strongest. Using a sunscreen or sunblock with SPF 30 or greater. Applying sunscreen before you are exposed to sunlight and reapplying as often as told by the instructions on the sunscreen container. Wearing protective gear, including: Sunglasses with UV protection. A hat and clothing that protect your skin from sunlight. Avoiding medicines that increase your sensitivity to sunlight when possible. Avoidingtanning beds and other indoor tanning devices. General instructions Take or apply over-the-counter and prescription medicines only as told by your health care provider. Return to your normal activities as told by your health care provider. Ask your health care provider what activities are safe for you. Have a skin exam done every year by a health care provider who is a skin specialist (dermatologist). Keep all follow-up visits. Your health care provider will want to check that the site has healed after treatment. Contact a health care provider if: You notice any changes or new growths on your skin. You have swelling, pain, or redness around your treated area. You have fluid or blood coming from your treated area. Your treated area feels warm to the touch. You have pus or a bad smell coming from your treated area. You have a fever or chills. You have a blister that becomes large and painful. Summary An actinic keratosis is a precancerous growth on the skin.If left untreated, these growths can develop into skin cancer. Check your skin regularly for any growths, especially growths that start to itch or bleed, or change in size, shape, or color. Take steps to protect your skin from the sun. Contact a health care provider if you notice any changes or new growths on your skin. This information is not intended to replace advice given to you by your health care provider. Make sure you discuss any questions you have with your health care provider. Document Revised: 07/13/2021 Document Reviewed:  07/13/2021 Elsevier Patient Education  2023 [employer] Inc.  

## 2022-01-22 DIAGNOSIS — L578 Other skin changes due to chronic exposure to nonionizing radiation: Secondary | ICD-10-CM | POA: Diagnosis not present

## 2022-01-22 DIAGNOSIS — L814 Other melanin hyperpigmentation: Secondary | ICD-10-CM | POA: Diagnosis not present

## 2022-01-22 DIAGNOSIS — C4441 Basal cell carcinoma of skin of scalp and neck: Secondary | ICD-10-CM | POA: Diagnosis not present

## 2022-02-08 DIAGNOSIS — C4441 Basal cell carcinoma of skin of scalp and neck: Secondary | ICD-10-CM | POA: Diagnosis not present

## 2022-02-12 ENCOUNTER — Encounter: Payer: Medicaid Other | Admitting: Nurse Practitioner

## 2022-02-19 ENCOUNTER — Other Ambulatory Visit: Payer: Self-pay | Admitting: Nurse Practitioner

## 2022-02-19 ENCOUNTER — Ambulatory Visit: Payer: Medicaid Other | Admitting: Internal Medicine

## 2022-02-19 ENCOUNTER — Encounter: Payer: Self-pay | Admitting: Internal Medicine

## 2022-02-19 VITALS — BP 170/82 | HR 83 | Temp 98.6°F | Resp 16 | Ht 65.0 in | Wt 184.0 lb

## 2022-02-19 DIAGNOSIS — J302 Other seasonal allergic rhinitis: Secondary | ICD-10-CM

## 2022-02-19 DIAGNOSIS — Z91018 Allergy to other foods: Secondary | ICD-10-CM | POA: Diagnosis not present

## 2022-02-19 DIAGNOSIS — J3089 Other allergic rhinitis: Secondary | ICD-10-CM | POA: Diagnosis not present

## 2022-02-19 DIAGNOSIS — H1013 Acute atopic conjunctivitis, bilateral: Secondary | ICD-10-CM

## 2022-02-19 MED ORDER — AZELASTINE HCL 0.1 % NA SOLN: 2.0000 | Freq: Two times a day (BID) | NASAL | 3 refills | Status: DC

## 2022-02-19 MED ORDER — FEXOFENADINE HCL 180 MG PO TABS: 180.0000 mg | ORAL_TABLET | Freq: Every day | ORAL | 3 refills | Status: DC

## 2022-02-19 MED ORDER — EPINEPHRINE 0.3 MG/0.3ML IJ SOAJ: 0.3000 mg | INTRAMUSCULAR | 1 refills | Status: AC | PRN

## 2022-02-19 MED ORDER — FLUTICASONE PROPIONATE 50 MCG/ACT NA SUSP: 2.0000 | Freq: Every day | NASAL | 3 refills | Status: DC

## 2022-02-19 NOTE — Patient Instructions (Addendum)
Rhinitis: - Positive skin test to: dust mites, mold, ragweed, dog - Avoidance measures as discussed. - Use nasal saline rinses before nose sprays such as with Neilmed Sinus Rinse.  Use distilled water.   - Use Flonase 2 sprays each nostril daily. Aim upward and outward. - Use Azelastine 1-2 sprays each nostril twice daily as needed. Aim upward and outward. - Use Allegra 180mg  or Claritin 5mg  daily as needed for itchy nose or runny nose.   Alpha Gal Allergy - Continue avoidance of all mammalian meats. - Avoid tick bites. Wear long pants and long-sleeved shirts when you're in wooded, grassy areas. Use bug spray too. Check your whole body for ticks after you spend time outside. -Some people with alpha gal allergy may also be sensitive to alpha-gal found in: Gelatin made from beef or pork Products made from or cooked with mammalian fat (such as lard,  tallow, or suet) Meat broth, bouillon, stock, and gravy - for SKIN only reaction, okay to take Claritin or Allegra.  - for SKIN + ANY additional symptoms, OR IF concern for LIFE THREATENING reaction = Epipen Autoinjector EpiPen 0.3 mg. - If using Epinephrine autoinjector, call 911     ALLERGEN AVOIDANCE MEASURES  Dust Mites Use central air conditioning and heat; and change the filter monthly.  Pleated filters work better than mesh filters.  Electrostatic filters may also be used; wash the filter monthly.  Window air conditioners may be used, but do not clean the air as well as a central air conditioner.  Change or wash the filter monthly. Keep windows closed.  Do not use attic fans.   Encase the mattress, box springs and pillows with zippered, dust proof covers. Wash the bed linens in hot water weekly.   Remove carpet, especially from the bedroom. Remove stuffed animals, throw pillows, dust ruffles, heavy drapes and other items that collect dust from the bedroom. Do not use a humidifier.   Use wood, vinyl or leather furniture instead of  cloth furniture in the bedroom. Keep the indoor humidity at 30 - 40%.  Monitor with a humidity gauge.  Molds - Indoor avoidance Use air conditioning to reduce indoor humidity.  Do not use a humidifier. Keep indoor humidity at 30 - 40%.  Use a dehumidifier if needed. In the bathroom use an exhaust fan or open a window after showering.  Wipe down damp surfaces after showering.  Clean bathrooms with a mold-killing solution (diluted bleach, or products like Tilex, etc) at least once a month. In the kitchen use an exhaust fan to remove steam from cooking.  Throw away spoiled foods immediately, and empty garbage daily.  Empty water pans below self-defrosting refrigerators frequently. Vent the clothes dryer to the outside. Limit indoor houseplants; mold grows in the dirt.  No houseplants in the bedroom. Remove carpet from the bedroom. Encase the mattress and box springs with a zippered encasing.  Molds - Outdoor avoidance Avoid being outside when the grass is being mowed, or the ground is tilled. Avoid playing in leaves, pine straw, hay, etc.  Dead plant materials contain mold. Avoid going into barns or grain storage areas. Remove leaves, clippings and compost from around the home.   Pollen Avoidance Pollen levels are highest during the mid-day and afternoon.  Consider this when planning outdoor activities. Avoid being outside when the grass is being mowed, or wear a mask if the pollen-allergic person must be the one to mow the grass. Keep the windows closed to keep pollen outside  of the home. Use an air conditioner to filter the air. Take a shower, wash hair, and change clothing after working or playing outdoors during pollen season.

## 2022-02-19 NOTE — Progress Notes (Signed)
NEW PATIENT  Date of Service/Encounter:  02/19/22  Consult requested by: Daryll Drown, NP   Subjective:   Kaitlin Fleming (DOB: 07-Feb-1961) is a 61 y.o. female who presents to the clinic on 02/19/2022 with a chief complaint of Allergic Reaction (Says she has an alpha gal allergy and wants to get more information about it and what exactly she is allergic to. ) .    History obtained from: chart review and patient.   Alpha Gal Allergy Reports onset of symptoms for 10+ years.  Initially, she kept breaking out in random rashes, throat swelling and not feeling well.  She kept trying to figure out what foods could be causing her symptoms and initially thought maybe it was related to strawberries and other random foods but could never track down any particular foods.  She was then once evaluated in urgent care after an episode with rashes and throat swelling and had an alpha-gal panel run on her that was positive.  She has had [place] exposure [place]  Most recent episode was in February 2023 where she ate a very tiny amount of hamburger meat and developed throat swelling few hours after and had to go to the emergency room.  In spring of this year, she ate chicken and broke out in a little red, bumpy rash but denies any other symptoms of hives, throat swelling, GI sxs, palpitations.  Since then she continues to eat chicken without any issues.  She also strictly avoids all red meats.  She does not eat out much to avoid cross-contamination of foods. She does have an EpiPen but needs a refill.  Rhinitis:  Started years ago  Symptoms include: nasal congestion, rhinorrhea, post nasal drainage, watery eyes, and itchy eyes  Occurs seasonally-Spring and Summer Potential triggers: pollen Treatments tried: Claritin PRN; her last use was 1 week ago. She gets sleepy with the Claritin.  She can't take Zyrtec because it caused her to be itchy. Previous allergy testing: no History of  reflux/heartburn: yes on Protonix History of chronic sinusitis or sinus surgery: no      Past Medical History: Past Medical History:  Diagnosis Date   Allergy to alpha-gal    Chronic back pain    stenosis   Chronic pain    takes Lexapro daily   Complication of anesthesia    pt states after surgery in 2000 had to wear a heart monitor for 3 days.   DDD (degenerative disc disease)    Depression    GERD (gastroesophageal reflux disease)    takes OTC meds if needed   History of blood transfusion    as a child   History of bronchitis 82yrs ago   Hyperlipidemia    takes Fenofibrate daily   Joint pain    Joint swelling    Kidney stone    has one right now on the right side but not giving any problems   Osteoarthritis    Peripheral edema    takes HCTZ daily    Pneumonia 45yrs ago   hx of   PONV (postoperative nausea and vomiting)    Rheumatoid arthritis (HCC)    Rheumatoid arthritis (HCC)    Weakness    and tingling on left side    Past Surgical History: Past Surgical History:  Procedure Laterality Date   ABDOMINAL HYSTERECTOMY  05/14/1998   BACK SURGERY  03/10/2014   lumbar fusion   BASAL CELL CARCINOMA EXCISION  11/2021  LUMBAR LAMINECTOMY/DECOMPRESSION MICRODISCECTOMY Left 12/08/2013   Procedure: LEFT LUMBAR FOUR-FIVEW, LUMBAR FIVE-SACRAL ONE LAMINECTOMY;  Surgeon: Karn Cassis, MD;  Location: [facility];  Service: Neurosurgery;  Laterality: Left;   TUBAL LIGATION  24 yrs ago    Family History: Family History  Problem Relation Age of Onset   Colon cancer Maternal Grandmother    Colon cancer Paternal Uncle     Social History:  Lives in a 70 year old house Flooring in bedroom: wood Pets: dog Tobacco use/exposure: none Job: retired; farming, Recruitment consultant  Medication List:  Allergies as of 02/19/2022       Reactions   Alpha-gal Anaphylaxis, Swelling, Hives, Other (See Comments)   Serology-proven Serology-proven   Macrobid [nitrofurantoin] Shortness  Of Breath, Rash   Pravastatin Other (See Comments), Nausea And Vomiting   Rash & heart racing. Rash & heart racing. Rash & heart racing.   Macrolides And Ketolides    Methotrexate    Remicade [infliximab]    Tofacitinib Other (See Comments)   Upper respiratory infections   Ibuprofen Hives   Penicillins Other (See Comments)   Breaks out Has patient had a PCN reaction causing immediate rash, facial/tongue/throat swelling, SOB or lightheadedness with hypotension: yes Has patient had a PCN reaction causing severe rash involving mucus membranes or skin necrosis: no Has patient had a PCN reaction that required hospitalization: no Has patient had a PCN reaction occurring within the last 10 years:no If all of the above answers are "NO", then may proceed with Cephalosporin use.   Sulfa Antibiotics Other (See Comments)   Breaks out        Medication List        Accurate as of February 19, 2022 10:59 AM. If you have any questions, ask your nurse or doctor.          STOP taking these medications    acetaminophen 500 MG tablet Commonly known as: TYLENOL Stopped by: Birder Robson, MD   ondansetron 4 MG tablet Commonly known as: Zofran Stopped by: Birder Robson, MD   pantoprazole 40 MG tablet Commonly known as: PROTONIX Stopped by: Birder Robson, MD   Vitamin D (Ergocalciferol) 1.25 MG (50000 UNIT) Caps capsule Commonly known as: DRISDOL Stopped by: Birder Robson, MD       TAKE these medications    azelastine 0.1 % nasal spray Commonly known as: ASTELIN Place 2 sprays into both nostrils 2 (two) times daily. Use in each nostril as directed Started by: Birder Robson, MD   cholestyramine 4 g packet Commonly known as: Questran Take 1 packet (4 g total) by mouth 2 (two) times daily. Mix with fluid or food   EPINEPHrine 0.3 mg/0.3 mL Soaj injection Commonly known as: EPI-PEN Inject 0.3 mg into the muscle as needed for anaphylaxis. What changed:  how much to take when  to take this reasons to take this Changed by: Birder Robson, MD   fexofenadine 180 MG tablet Commonly known as: Allegra Allergy Take 1 tablet (180 mg total) by mouth daily. Started by: Birder Robson, MD   fluticasone 50 MCG/ACT nasal spray Commonly known as: FLONASE Place 2 sprays into both nostrils daily.   loratadine 10 MG tablet Commonly known as: CLARITIN Take 1 tablet (10 mg total) by mouth at bedtime.   oxyCODONE-acetaminophen 10-325 MG tablet Commonly known as: PERCOCET Take 1 tablet by mouth every 6 (six) hours as needed for pain.   predniSONE 5 MG tablet Commonly known as:  DELTASONE Take 1 tablet (5 mg total) by mouth daily.   Rinvoq 15 MG Tb24 Generic drug: Upadacitinib ER Take 1 tablet by mouth daily.   sertraline 50 MG tablet Commonly known as: ZOLOFT Take 1 tablet (50 mg total) by mouth daily.   triamcinolone cream 0.1 % Commonly known as: KENALOG Apply topically.         REVIEW OF SYSTEMS: Pertinent positives and negatives discussed in HPI.   Objective:   Physical Exam: BP (!) 170/82   Pulse 83   Temp 98.6 F (37 C) (Temporal)   Resp 16   Ht 5\' 5"  (1.651 m)   Wt 184 lb (83.5 kg)   SpO2 97%   BMI 30.62 kg/m  Body mass index is 30.62 kg/m. GEN: alert, well developed HEENT: clear conjunctiva, TM grey and translucent, nose with + inferior turbinate hypertrophy, pink nasal mucosa, slight clear rhinorrhea, + cobblestoning HEART: regular rate and rhythm, no murmur LUNGS: clear to auscultation bilaterally, no coughing, unlabored respiration ABDOMEN: soft, non distended  SKIN: no rashes or lesions  Reviewed:  IgE alpha gal 12/2021: 21.90 IgE alpha gal 01/2021: 48.20  Skin Testing:  Skin prick testing was placed, which includes aeroallergens/foods, histamine control, and saline control.  Verbal consent was obtained prior to placing test.  We discussed risks including anaphylaxis. Patient tolerated procedure well.  Allergy testing results were  read and interpreted by myself, documented by clinical staff. Adequate positive and negative control.  Results discussed with patient/family.  Airborne Adult Perc - 02/19/22 1008     Time Antigen Placed 0930    Allergen Manufacturer 04/21/22    Location Back    Number of Test 59    1. Control-Buffer 50% Glycerol Negative    2. Control-Histamine 1 mg/ml 2+    3. Albumin saline Negative    4. Bahia Negative    5. Waynette Buttery Negative    6. Johnson Negative    7. Kentucky Blue Negative    8. Meadow Fescue Negative    9. Perennial Rye Negative    10. Sweet Vernal Negative    11. Timothy Negative    12. Cocklebur Negative    13. Burweed Marshelder Negative    14. Ragweed, short Negative    15. Ragweed, Giant Negative    16. Plantain,  English Negative    17. Lamb's Quarters Negative    18. Sheep Sorrell Negative    19. Rough Pigweed Negative    20. Marsh Elder, Rough Negative    21. Mugwort, Common Negative    22. Ash mix Negative    23. Birch mix Negative    24. Beech American Negative    25. Box, Elder Negative    26. Cedar, red Negative    27. Cottonwood, [place] Negative    28. Elm mix Negative    29. Hickory Negative    30. Maple mix Negative    31. Oak, [place] mix Negative    32. Pecan Pollen Negative    33. Pine mix Negative    34. Sycamore Eastern Negative    35. Walnut, Black Pollen Negative    36. Alternaria alternata --   1+   37. Cladosporium Herbarum Negative    38. Aspergillus mix Negative    39. Penicillium mix --   1+   40. Bipolaris sorokiniana (Helminthosporium) --   1+   41. Drechslera spicifera (Curvularia) Negative    42. Mucor plumbeus --   1+   43. Fusarium moniliforme --  1+   44. Aureobasidium pullulans (pullulara) Negative    45. Rhizopus oryzae Negative    46. Botrytis cinera Negative    47. Epicoccum nigrum Negative    48. Phoma betae Negative    49. Candida Albicans --   1+   50. Trichophyton mentagrophytes Negative    51. Mite, D Farinae   5,000 AU/ml Negative    52. Mite, D Pteronyssinus  5,000 AU/ml Negative    53. Cat Hair 10,000 BAU/ml Negative    54.  Dog Epithelia Negative    55. Mixed Feathers Negative    56. Horse Epithelia Negative    57. Cockroach, German Negative    58. Mouse Negative    59. Tobacco Leaf --   1+            Intradermal - 02/19/22 1008     Time Antigen Placed 1005    Allergen Manufacturer Greer    Location Arm    Number of Test 11    Control Negative    [place] Negative    Johnson Negative    7 Grass Negative    Ragweed mix 1+    Weed mix Negative    Tree mix Negative    Cat Negative    Dog 1+    Cockroach Negative    Mite mix 2+               Assessment:   1. Allergy to alpha-gal   2. Allergic conjunctivitis of both eyes   3. Seasonal and perennial allergic rhinitis     Plan/Recommendations:   Allergic Rhinoconjunctivitis - Positive skin test 02/2022 to: dust mites, mold, ragweed, dog - Avoidance measures as discussed. - Use nasal saline rinses before nose sprays such as with Neilmed Sinus Rinse.  Use distilled water.   - Use Flonase 2 sprays each nostril daily. Aim upward and outward. - Use Azelastine 1-2 sprays each nostril twice daily as needed. Aim upward and outward. - Use Allegra 180mg  or Claritin 5mg  daily as needed for itchy nose or runny nose.   Alpha Gal Allergy - Continue avoidance of all mammalian meats. - Avoid tick bites. Wear long pants and long-sleeved shirts when you're in wooded, grassy areas. Use bug spray too. Check your whole body for ticks after you spend time outside. -Some people with alpha gal allergy may also be sensitive to alpha-gal found in: Gelatin made from beef or pork Products made from or cooked with mammalian fat (such as lard,  tallow, or suet) Meat broth, bouillon, stock, and gravy - for SKIN only reaction, okay to take Claritin or Allegra.  - for SKIN + ANY additional symptoms, OR IF concern for LIFE THREATENING reaction  = Epipen Autoinjector EpiPen 0.3 mg. - If using Epinephrine autoinjector, call 911    Return in about 3 months (around 05/22/2022).  Harlon Flor, MD [facility]

## 2022-03-06 DIAGNOSIS — M0579 Rheumatoid arthritis with rheumatoid factor of multiple sites without organ or systems involvement: Secondary | ICD-10-CM | POA: Diagnosis not present

## 2022-03-06 DIAGNOSIS — T781XXA Other adverse food reactions, not elsewhere classified, initial encounter: Secondary | ICD-10-CM | POA: Diagnosis not present

## 2022-03-06 DIAGNOSIS — M19071 Primary osteoarthritis, right ankle and foot: Secondary | ICD-10-CM | POA: Diagnosis not present

## 2022-03-06 DIAGNOSIS — Z79899 Other long term (current) drug therapy: Secondary | ICD-10-CM | POA: Diagnosis not present

## 2022-03-06 DIAGNOSIS — M19072 Primary osteoarthritis, left ankle and foot: Secondary | ICD-10-CM | POA: Diagnosis not present

## 2022-03-06 DIAGNOSIS — Z7952 Long term (current) use of systemic steroids: Secondary | ICD-10-CM | POA: Diagnosis not present

## 2022-03-15 DIAGNOSIS — G603 Idiopathic progressive neuropathy: Secondary | ICD-10-CM | POA: Diagnosis not present

## 2022-03-15 DIAGNOSIS — Z79891 Long term (current) use of opiate analgesic: Secondary | ICD-10-CM | POA: Diagnosis not present

## 2022-03-15 DIAGNOSIS — M25552 Pain in left hip: Secondary | ICD-10-CM | POA: Diagnosis not present

## 2022-03-15 DIAGNOSIS — M545 Low back pain, unspecified: Secondary | ICD-10-CM | POA: Diagnosis not present

## 2022-03-21 ENCOUNTER — Ambulatory Visit: Payer: Medicaid Other | Admitting: Nurse Practitioner

## 2022-03-21 ENCOUNTER — Encounter: Payer: Self-pay | Admitting: Nurse Practitioner

## 2022-03-21 VITALS — BP 138/80 | HR 80 | Temp 97.6°F | Ht 65.0 in | Wt 180.0 lb

## 2022-03-21 DIAGNOSIS — R591 Generalized enlarged lymph nodes: Secondary | ICD-10-CM

## 2022-03-21 DIAGNOSIS — Z0000 Encounter for general adult medical examination without abnormal findings: Secondary | ICD-10-CM

## 2022-03-21 NOTE — Patient Instructions (Signed)
Lymphadenopathy  Lymphadenopathy means that your lymph glands are swollen or larger than normal. Lymph glands, also called lymph nodes, are collections of tissue that filter excess fluid, bacteria, viruses, and waste from your bloodstream. They are part of your body's disease-fighting system (immune system), which protects your body from germs. There may be different causes of lymphadenopathy, depending on where it is in your body. Some types go away on their own. Lymphadenopathy can occur anywhere that you have lymph glands, including these areas: Neck (cervical lymphadenopathy). Chest (mediastinal lymphadenopathy). Lungs (hilar lymphadenopathy). Underarms (axillary lymphadenopathy). Groin (inguinal lymphadenopathy). When your immune system responds to germs, infection-fighting cells and fluid build up in your lymph glands. This causes some swelling and enlargement. If the lymph nodes do not go back to normal size after you have an infection or disease, your health care provider may do tests. These tests help to monitor your condition and find the reason why the glands are still swollen and enlarged. Follow these instructions at home:  Get plenty of rest. Your health care provider may recommend over-the-counter medicines for pain. Take over-the-counter and prescription medicines only as told by your health care provider. If directed, apply heat to swollen lymph glands as often as told by your health care provider. Use the heat source that your health care provider recommends, such as a moist heat pack or a heating pad. Place a towel between your skin and the heat source. Leave the heat on for 20-30 minutes. Remove the heat if your skin turns bright red. This is especially important if you are unable to feel pain, heat, or cold. You may have a greater risk of getting burned. Check your affected lymph glands every day for changes. Check other lymph gland areas as told by your health care provider.  Check for changes such as: More swelling. Sudden increase in size. Redness or pain. Hardness. Keep all follow-up visits. This is important. Contact a health care provider if you have: Lymph glands that: Are still swollen after 2 weeks. Have suddenly gotten bigger or the swelling spreads. Are red, painful, or hard. Fluid leaking from the skin near an enlarged lymph gland. Problems with breathing. A fever, chills, or night sweats. Fatigue. A sore throat. Pain in your abdomen. Weight loss. Get help right away if you have: Severe pain. Chest pain. Shortness of breath. These symptoms may represent a serious problem that is an emergency. Do not wait to see if the symptoms will go away. Get medical help right away. Call your local emergency services (911 in the [place] Do not drive yourself to the hospital. Summary Lymphadenopathy means that your lymph glands are swollen or larger than normal. Lymph glands, also called lymph nodes, are collections of tissue that filter excess fluid, bacteria, viruses, and waste from the bloodstream. They are part of your body's disease-fighting system (immune system). Lymphadenopathy can occur anywhere that you have lymph glands. If the lymph nodes do not go back to normal size after you have an infection or disease, your health care provider may do tests to monitor your condition and find the reason why the glands are still swollen and enlarged. Check your affected lymph glands every day for changes. Check other lymph gland areas as told by your health care provider. This information is not intended to replace advice given to you by your health care provider. Make sure you discuss any questions you have with your health care provider. Document Revised: 02/24/2020 Document Reviewed: 02/24/2020 Elsevier Patient Education    2023 Elsevier Inc.  

## 2022-03-21 NOTE — Progress Notes (Signed)
   Acute Office Visit  Subjective:     Patient ID: Kaitlin Fleming, female    DOB: February 06, 1961, 61 y.o.   MRN: [id number]  Chief Complaint  Patient presents with   Arm Pain    Under arm lump on right side     HPI Patient is in today for lump in right underarm.  Symptoms present in the past 2 weeks.  Patient denies fever, body aches, chills, malaise nausea or vomiting.  Patient denies recent illness in the last week or current symptoms.  Review of Systems  Constitutional: Negative.  Negative for chills, fever, malaise/fatigue and weight loss.  HENT: Negative.    Eyes: Negative.   Respiratory: Negative.    Cardiovascular: Negative.   Gastrointestinal: Negative.   Genitourinary: Negative.   Musculoskeletal: Negative.   Skin: Negative.  Negative for itching and rash.  All other systems reviewed and are negative.       Objective:    BP 138/80   Pulse 80   Temp 97.6 F (36.4 C)   Ht 5\' 5"  (1.651 m)   Wt 180 lb (81.6 kg)   SpO2 98%   BMI 29.95 kg/m  BP Readings from Last 3 Encounters:  03/21/22 138/80  02/19/22 (!) 170/82  01/19/22 (!) 158/90   Wt Readings from Last 3 Encounters:  03/21/22 180 lb (81.6 kg)  02/19/22 184 lb (83.5 kg)  01/19/22 181 lb (82.1 kg)      Physical Exam Vitals and nursing note reviewed.  Constitutional:      Appearance: Normal appearance.  HENT:     Head: Normocephalic.     Right Ear: External ear normal.     Left Ear: External ear normal.     Nose: Nose normal.     Mouth/Throat:     Mouth: Mucous membranes are moist.     Pharynx: Oropharynx is clear.  Eyes:     Conjunctiva/sclera: Conjunctivae normal.  Cardiovascular:     Rate and Rhythm: Normal rate and regular rhythm.     Pulses: Normal pulses.     Heart sounds: Normal heart sounds.  Pulmonary:     Effort: Pulmonary effort is normal.     Breath sounds: Normal breath sounds.  Abdominal:     General: Bowel sounds are normal.  Skin:    General: Skin is warm.  Neurological:      General: No focal deficit present.     Mental Status: She is alert and oriented to person, place, and time.     No results found for any visits on 03/21/22.      Assessment & Plan:  Assessment completed for right upper arm lymphadenopathy.  Ultrasound completed results pending.  Advised patient to follow-up with symptoms of fever or chills.  Tylenol as needed for pain.   Problem List Items Addressed This Visit   None Visit Diagnoses     Lymphadenopathy    -  Primary   Relevant Orders   13/08/23 UPPER EXTREMITY DUPLEX RIGHT (NON-WBI)   Healthcare maintenance       Relevant Orders   MM Digital Screening       No orders of the defined types were placed in this encounter.   Return if symptoms worsen or fail to improve.  [place], NP

## 2022-03-22 ENCOUNTER — Telehealth: Payer: Self-pay | Admitting: Nurse Practitioner

## 2022-03-22 NOTE — Telephone Encounter (Signed)
REFERRAL REQUEST Telephone Note  Have you been seen at our office for this problem? Yes, on Wednesday (Advise that they may need an appointment with their PCP before a referral can be done)  Reason for Referral: CT scan & Mammogram Referral discussed with patient: yes  Best contact number of patient for referral team: [phone] (Home)     Has patient been seen by a specialist for this issue before: not sure  Patient provider preference for referral: no Patient location preference for referral: no   Patient notified that referrals can take up to a week or longer to process. If they haven't heard anything within a week they should call back and speak with the referral department.

## 2022-03-23 DIAGNOSIS — M545 Low back pain, unspecified: Secondary | ICD-10-CM | POA: Diagnosis not present

## 2022-03-26 ENCOUNTER — Other Ambulatory Visit (HOSPITAL_COMMUNITY): Payer: Self-pay | Admitting: Nurse Practitioner

## 2022-03-26 DIAGNOSIS — N63 Unspecified lump in unspecified breast: Secondary | ICD-10-CM

## 2022-03-27 ENCOUNTER — Telehealth: Payer: Self-pay | Admitting: Nurse Practitioner

## 2022-03-27 NOTE — Telephone Encounter (Signed)
Pt called stating that she is scheduled to have mammogram US done at [facility] on 11/29 but says she has always went to [facility] in [place] and that's where she wants to go. Has already made an appt there for 11/22.   Order should be written as Diagnostic Bilateral Mammogram with [place]. Please fax to [phone]

## 2022-03-28 NOTE — Telephone Encounter (Signed)
Patient already has appt for mammogram. Not sure what type of CT scan she needs - there is no order in chart - please review and advise

## 2022-04-02 NOTE — Telephone Encounter (Signed)
Spoke to patient - she does not need a CT scan - she had called about needing the mammogram and [place] - orders are in chart and have been faxed to the [facility] - she has appt 04/04/2022

## 2022-04-04 DIAGNOSIS — N6489 Other specified disorders of breast: Secondary | ICD-10-CM | POA: Diagnosis not present

## 2022-04-04 DIAGNOSIS — R928 Other abnormal and inconclusive findings on diagnostic imaging of breast: Secondary | ICD-10-CM | POA: Diagnosis not present

## 2022-04-04 DIAGNOSIS — N6459 Other signs and symptoms in breast: Secondary | ICD-10-CM | POA: Diagnosis not present

## 2022-04-04 DIAGNOSIS — J209 Acute bronchitis, unspecified: Secondary | ICD-10-CM | POA: Diagnosis not present

## 2022-04-04 DIAGNOSIS — J0100 Acute maxillary sinusitis, unspecified: Secondary | ICD-10-CM | POA: Diagnosis not present

## 2022-04-04 DIAGNOSIS — R590 Localized enlarged lymph nodes: Secondary | ICD-10-CM | POA: Diagnosis not present

## 2022-04-11 ENCOUNTER — Ambulatory Visit (HOSPITAL_COMMUNITY): Payer: Medicaid Other

## 2022-04-11 ENCOUNTER — Encounter (HOSPITAL_COMMUNITY): Payer: Medicaid Other

## 2022-04-11 ENCOUNTER — Ambulatory Visit (HOSPITAL_COMMUNITY): Admission: RE | Admit: 2022-04-11 | Payer: Medicaid Other | Source: Ambulatory Visit

## 2022-05-05 DIAGNOSIS — U071 COVID-19: Secondary | ICD-10-CM | POA: Diagnosis not present

## 2022-05-09 DIAGNOSIS — R110 Nausea: Secondary | ICD-10-CM | POA: Diagnosis not present

## 2022-05-09 DIAGNOSIS — E860 Dehydration: Secondary | ICD-10-CM | POA: Diagnosis not present

## 2022-05-09 DIAGNOSIS — R197 Diarrhea, unspecified: Secondary | ICD-10-CM | POA: Diagnosis not present

## 2022-05-09 DIAGNOSIS — B349 Viral infection, unspecified: Secondary | ICD-10-CM | POA: Diagnosis not present

## 2022-05-09 DIAGNOSIS — R051 Acute cough: Secondary | ICD-10-CM | POA: Diagnosis not present

## 2022-05-24 DIAGNOSIS — C4441 Basal cell carcinoma of skin of scalp and neck: Secondary | ICD-10-CM | POA: Diagnosis not present

## 2022-05-24 DIAGNOSIS — D2239 Melanocytic nevi of other parts of face: Secondary | ICD-10-CM | POA: Diagnosis not present

## 2022-05-28 ENCOUNTER — Ambulatory Visit: Payer: Medicaid Other | Admitting: Internal Medicine

## 2022-06-07 ENCOUNTER — Encounter: Payer: Self-pay | Admitting: Nurse Practitioner

## 2022-06-26 DIAGNOSIS — M5459 Other low back pain: Secondary | ICD-10-CM | POA: Diagnosis not present

## 2022-06-26 DIAGNOSIS — Z79891 Long term (current) use of opiate analgesic: Secondary | ICD-10-CM | POA: Diagnosis not present

## 2022-06-26 DIAGNOSIS — M5416 Radiculopathy, lumbar region: Secondary | ICD-10-CM | POA: Diagnosis not present

## 2022-06-26 DIAGNOSIS — M4326 Fusion of spine, lumbar region: Secondary | ICD-10-CM | POA: Diagnosis not present

## 2022-06-26 DIAGNOSIS — G894 Chronic pain syndrome: Secondary | ICD-10-CM | POA: Diagnosis not present

## 2022-06-26 DIAGNOSIS — Z79899 Other long term (current) drug therapy: Secondary | ICD-10-CM | POA: Diagnosis not present

## 2022-07-11 DIAGNOSIS — J029 Acute pharyngitis, unspecified: Secondary | ICD-10-CM | POA: Diagnosis not present

## 2022-07-11 DIAGNOSIS — J0100 Acute maxillary sinusitis, unspecified: Secondary | ICD-10-CM | POA: Diagnosis not present

## 2022-07-11 DIAGNOSIS — R0981 Nasal congestion: Secondary | ICD-10-CM | POA: Diagnosis not present

## 2022-07-15 ENCOUNTER — Emergency Department (HOSPITAL_COMMUNITY)
Admission: EM | Admit: 2022-07-15 | Discharge: 2022-07-15 | Disposition: A | Discharge status: Home / Self Care | Payer: Medicaid Other | Attending: Student | Admitting: Student

## 2022-07-15 ENCOUNTER — Encounter (HOSPITAL_COMMUNITY): Payer: Self-pay

## 2022-07-15 ENCOUNTER — Other Ambulatory Visit: Payer: Self-pay

## 2022-07-15 DIAGNOSIS — H9203 Otalgia, bilateral: Secondary | ICD-10-CM | POA: Insufficient documentation

## 2022-07-15 DIAGNOSIS — J029 Acute pharyngitis, unspecified: Secondary | ICD-10-CM | POA: Insufficient documentation

## 2022-07-15 DIAGNOSIS — Z79899 Other long term (current) drug therapy: Secondary | ICD-10-CM | POA: Diagnosis not present

## 2022-07-15 NOTE — ED Provider Notes (Signed)
[facility] Provider Note   CSN: [id number] Arrival date & time: 07/15/22  1802     History  Chief Complaint  Patient presents with   Ear Pain    Kaitlin Fleming is a 62 y.o. female.  Patient complains of bilateral ear pain and sore throat which began February 28.  Patient was evaluated urgent care that day and had negative COVID, strep, flu testing at that time.  Patient was prescribed cefdinir for bilateral ear pain.  She states she has been taking it since leaving the urgent care but continues to have some ear discomfort and throat discomfort.  Patient does endorse having grandchildren with similar symptoms.  Patient denies fevers, nausea, vomiting.  Past medical history significant for RA, alpha gal allergy, chronic pain  HPI     Home Medications Prior to Admission medications   Medication Sig Start Date End Date Taking? Authorizing Provider  azelastine (ASTELIN) 0.1 % nasal spray Place 2 sprays into both nostrils 2 (two) times daily. Use in each nostril as directed 02/19/22   Larose Kells, MD  cholestyramine (QUESTRAN) 4 g packet Take 1 packet (4 g total) by mouth 2 (two) times daily. Mix with fluid or food 04/19/21   Ivy Lynn, NP  EPINEPHrine 0.3 mg/0.3 mL IJ SOAJ injection Inject 0.3 mg into the muscle as needed for anaphylaxis. 02/19/22   Larose Kells, MD  fexofenadine [facility] ALLERGY) 180 MG tablet Take 1 tablet (180 mg total) by mouth daily. 02/19/22   Larose Kells, MD  fluticasone (FLONASE) 50 MCG/ACT nasal spray Place 2 sprays into both nostrils daily. 02/19/22   Larose Kells, MD  hydrochlorothiazide (HYDRODIURIL) 12.5 MG tablet TAKE 1/2 TABLET DAILY 02/19/22   Ivy Lynn, NP  loratadine (CLARITIN) 10 MG tablet Take 1 tablet (10 mg total) by mouth at bedtime. 08/04/17   Barton Dubois, MD  oxyCODONE-acetaminophen (PERCOCET) 10-325 MG tablet Take 1 tablet by mouth every 6 (six) hours as needed for pain.      [provider]  predniSONE (DELTASONE) 5 MG tablet Take 1 tablet (5 mg total) by mouth daily. 06/27/20   Ivy Lynn, NP  sertraline (ZOLOFT) 50 MG tablet Take 1 tablet (50 mg total) by mouth daily. 08/19/19   Evelina Dun A, FNP  triamcinolone cream (KENALOG) 0.1 % Apply topically. 01/02/22   [provider]  Upadacitinib ER (RINVOQ) 15 MG TB24 Take 1 tablet by mouth daily. 11/16/19   [provider]      Allergies    Alpha-gal, Macrobid [nitrofurantoin], Pravastatin, Macrolides and ketolides, Methotrexate, Remicade [infliximab], Tofacitinib, Ibuprofen, Penicillins, and Sulfa antibiotics    Review of Systems   Review of Systems  HENT:  Positive for ear pain and sore throat.     Physical Exam Updated Vital Signs BP (!) 147/73   Pulse (!) 102   Temp 99.8 F (37.7 C) (Oral)   Resp 20   Ht '5\' 7"'$  (1.702 m)   Wt 78.5 kg   SpO2 100%   BMI 27.10 kg/m  Physical Exam HENT:     Head: Normocephalic and atraumatic.     Right Ear: Tympanic membrane normal.     Left Ear: Tympanic membrane normal.     Mouth/Throat:     Mouth: Mucous membranes are moist.     Pharynx: Posterior oropharyngeal erythema present. No oropharyngeal exudate.  Eyes:     Conjunctiva/sclera: Conjunctivae normal.  Pulmonary:  Effort: Pulmonary effort is normal. No respiratory distress.  Musculoskeletal:        General: No signs of injury.     Cervical back: Normal range of motion.  Skin:    General: Skin is dry.  Neurological:     Mental Status: She is alert.  Psychiatric:        Speech: Speech normal.        Behavior: Behavior normal.     ED Results / Procedures / Treatments   Labs (all labs ordered are listed, but only abnormal results are displayed) Labs Reviewed - No data to display  EKG None  Radiology No results found.  Procedures Procedures    Medications Ordered in ED Medications - No data to display  ED Course/ Medical Decision Making/ A&P                              Medical Decision Making  Patient presents today with chief complaint of bilateral ear pain.  Differential diagnosis includes was not limited to otitis media, does externa, and others  I reviewed the patient's past medical history including charts from urgent care where the patient tested negative for COVID, strep, influenza  The patient's ears look mildly erythematous bilaterally at this time.  Symptoms are consistent with a mild otitis media.  Patient with recent negative strep testing.  I feel that the cefdinir the patient has been prescribed is reasonable.  I feel that she has not been taking it long enough for resolution of symptoms at this time.  Plan to discharge patient home at this time with recommendations for continued cefdinir with the addition of ibuprofen for inflammation.  Patient voices understanding with plan.  Discharge home.        Final Clinical Impression(s) / ED Diagnoses Final diagnoses:  Acute ear pain, bilateral    Rx / DC Orders ED Discharge Orders     None         Ronny Bacon 07/15/22 1909    Teressa Lower, MD 07/16/22 1204

## 2022-07-15 NOTE — Discharge Instructions (Signed)
You were evaluated today for bilateral ear pain.  Please continue to take the cefdinir prescribed by the urgent care.  Please continue the entire course of antibiotics.  I also recommend adding anti-inflammatory medication such as ibuprofen or naproxen.  Please follow-up as needed with your primary care provider for further evaluation and management.  If you develop any life-threatening symptoms please return to the emergency department.

## 2022-07-15 NOTE — ED Triage Notes (Signed)
Pt reports she has been taking abx for ear infection and does not feel improved.

## 2022-07-17 ENCOUNTER — Telehealth: Payer: Self-pay

## 2022-07-17 NOTE — Transitions of Care (Post Inpatient/ED Visit) (Signed)
   07/17/2022  Name: Kaitlin Fleming MRN: [id number] DOB: 1960/07/12  Today's TOC FU Call Status: Today's TOC FU Call Status:: Unsuccessul Call (1st Attempt) Unsuccessful Call (1st Attempt) Date: 07/17/22  Attempted to reach the patient regarding the most recent Inpatient/ED visit.  Follow Up Plan: Additional outreach attempts will be made to reach the patient to complete the Transitions of Care (Post Inpatient/ED visit) call.   Mickel Fuchs, BSW, [place] Managed Medicaid Team  [phone]

## 2022-07-20 ENCOUNTER — Emergency Department (HOSPITAL_COMMUNITY): Payer: Medicaid Other

## 2022-07-20 ENCOUNTER — Encounter (HOSPITAL_COMMUNITY): Payer: Self-pay | Admitting: Pharmacy Technician

## 2022-07-20 ENCOUNTER — Inpatient Hospital Stay (HOSPITAL_COMMUNITY)
Admission: EM | Admit: 2022-07-20 | Discharge: 2022-07-23 | DRG: 392 | Disposition: A | Discharge status: Home / Self Care | Payer: Medicaid Other | Attending: Family Medicine | Admitting: Family Medicine

## 2022-07-20 ENCOUNTER — Other Ambulatory Visit: Payer: Self-pay

## 2022-07-20 DIAGNOSIS — E876 Hypokalemia: Principal | ICD-10-CM | POA: Diagnosis present

## 2022-07-20 DIAGNOSIS — R112 Nausea with vomiting, unspecified: Secondary | ICD-10-CM

## 2022-07-20 DIAGNOSIS — Z85828 Personal history of other malignant neoplasm of skin: Secondary | ICD-10-CM | POA: Diagnosis not present

## 2022-07-20 DIAGNOSIS — Z881 Allergy status to other antibiotic agents status: Secondary | ICD-10-CM

## 2022-07-20 DIAGNOSIS — R6520 Severe sepsis without septic shock: Secondary | ICD-10-CM

## 2022-07-20 DIAGNOSIS — Z886 Allergy status to analgesic agent status: Secondary | ICD-10-CM | POA: Diagnosis not present

## 2022-07-20 DIAGNOSIS — Z880 Allergy status to penicillin: Secondary | ICD-10-CM | POA: Diagnosis not present

## 2022-07-20 DIAGNOSIS — Z882 Allergy status to sulfonamides status: Secondary | ICD-10-CM | POA: Diagnosis not present

## 2022-07-20 DIAGNOSIS — E872 Acidosis, unspecified: Secondary | ICD-10-CM | POA: Diagnosis not present

## 2022-07-20 DIAGNOSIS — K529 Noninfective gastroenteritis and colitis, unspecified: Secondary | ICD-10-CM | POA: Insufficient documentation

## 2022-07-20 DIAGNOSIS — Z79899 Other long term (current) drug therapy: Secondary | ICD-10-CM

## 2022-07-20 DIAGNOSIS — R509 Fever, unspecified: Secondary | ICD-10-CM | POA: Diagnosis not present

## 2022-07-20 DIAGNOSIS — R6510 Systemic inflammatory response syndrome (SIRS) of non-infectious origin without acute organ dysfunction: Secondary | ICD-10-CM | POA: Diagnosis present

## 2022-07-20 DIAGNOSIS — Z888 Allergy status to other drugs, medicaments and biological substances status: Secondary | ICD-10-CM

## 2022-07-20 DIAGNOSIS — A419 Sepsis, unspecified organism: Secondary | ICD-10-CM | POA: Diagnosis present

## 2022-07-20 DIAGNOSIS — M549 Dorsalgia, unspecified: Secondary | ICD-10-CM | POA: Diagnosis present

## 2022-07-20 DIAGNOSIS — Z1152 Encounter for screening for COVID-19: Secondary | ICD-10-CM

## 2022-07-20 DIAGNOSIS — Z981 Arthrodesis status: Secondary | ICD-10-CM | POA: Diagnosis not present

## 2022-07-20 DIAGNOSIS — Z800 Family history of malignant neoplasm of digestive organs: Secondary | ICD-10-CM

## 2022-07-20 DIAGNOSIS — G8929 Other chronic pain: Secondary | ICD-10-CM | POA: Diagnosis not present

## 2022-07-20 DIAGNOSIS — E785 Hyperlipidemia, unspecified: Secondary | ICD-10-CM | POA: Diagnosis not present

## 2022-07-20 DIAGNOSIS — F32A Depression, unspecified: Secondary | ICD-10-CM | POA: Diagnosis not present

## 2022-07-20 DIAGNOSIS — A080 Rotaviral enteritis: Secondary | ICD-10-CM | POA: Diagnosis not present

## 2022-07-20 DIAGNOSIS — R1110 Vomiting, unspecified: Secondary | ICD-10-CM | POA: Diagnosis not present

## 2022-07-20 DIAGNOSIS — K219 Gastro-esophageal reflux disease without esophagitis: Secondary | ICD-10-CM | POA: Diagnosis not present

## 2022-07-20 DIAGNOSIS — R197 Diarrhea, unspecified: Secondary | ICD-10-CM | POA: Diagnosis not present

## 2022-07-20 DIAGNOSIS — M069 Rheumatoid arthritis, unspecified: Secondary | ICD-10-CM | POA: Diagnosis present

## 2022-07-20 HISTORY — DX: Sepsis, unspecified organism: A41.9

## 2022-07-20 HISTORY — DX: Sepsis, unspecified organism: R65.20

## 2022-07-20 LAB — COMPREHENSIVE METABOLIC PANEL
ALT: 14 U/L (ref 0–44)
AST: 18 U/L (ref 15–41)
Albumin: 3.6 g/dL (ref 3.5–5.0)
Alkaline Phosphatase: 74 U/L (ref 38–126)
Anion gap: 9 (ref 5–15)
BUN: 8 mg/dL (ref 8–23)
CO2: 25 mmol/L (ref 22–32)
Calcium: 8.2 mg/dL — ABNORMAL LOW (ref 8.9–10.3)
Chloride: 101 mmol/L (ref 98–111)
Creatinine, Ser: 0.64 mg/dL (ref 0.44–1.00)
GFR, Estimated: >60 mL/min (ref >60)
Glucose, Bld: 111 mg/dL — ABNORMAL HIGH (ref 70–99)
Potassium: 2.5 mmol/L — CL (ref 3.5–5.1)
Sodium: 135 mmol/L (ref 135–145)
Total Bilirubin: 0.5 mg/dL (ref 0.3–1.2)
Total Protein: 7.5 g/dL (ref 6.5–8.1)

## 2022-07-20 LAB — CBC WITH DIFFERENTIAL/PLATELET
Abs Immature Granulocytes: 0.02 10*3/uL (ref 0.00–0.07)
Basophils Absolute: 0 10*3/uL (ref 0.0–0.1)
Basophils Relative: 0 %
Eosinophils Absolute: 0 10*3/uL (ref 0.0–0.5)
Eosinophils Relative: 0 %
HCT: 37.2 % (ref 36.0–46.0)
Hemoglobin: 11.8 g/dL — ABNORMAL LOW (ref 12.0–15.0)
Immature Granulocytes: 0 %
Lymphocytes Relative: 5 %
Lymphs Abs: 0.4 10*3/uL — ABNORMAL LOW (ref 0.7–4.0)
MCH: 29.6 pg (ref 26.0–34.0)
MCHC: 31.7 g/dL (ref 30.0–36.0)
MCV: 93.5 fL (ref 80.0–100.0)
Monocytes Absolute: 0.4 10*3/uL (ref 0.1–1.0)
Monocytes Relative: 5 %
Neutro Abs: 6.7 10*3/uL (ref 1.7–7.7)
Neutrophils Relative %: 90 %
Platelets: 189 10*3/uL (ref 150–400)
RBC: 3.98 MIL/uL (ref 3.87–5.11)
RDW: 12.3 % (ref 11.5–15.5)
WBC: 7.6 10*3/uL (ref 4.0–10.5)
nRBC: 0 % (ref 0.0–0.2)

## 2022-07-20 LAB — RESP PANEL BY RT-PCR (RSV, FLU A&B, COVID)  RVPGX2
Influenza A by PCR: NEGATIVE
Influenza B by PCR: NEGATIVE
Resp Syncytial Virus by PCR: NEGATIVE
SARS Coronavirus 2 by RT PCR: NEGATIVE

## 2022-07-20 LAB — URINALYSIS, ROUTINE W REFLEX MICROSCOPIC
Bacteria, UA: NONE SEEN
Bilirubin Urine: NEGATIVE
Glucose, UA: NEGATIVE mg/dL
Ketones, ur: NEGATIVE mg/dL
Nitrite: NEGATIVE
Protein, ur: NEGATIVE mg/dL
Specific Gravity, Urine: 1.023 (ref 1.005–1.030)
pH: 5 (ref 5.0–8.0)

## 2022-07-20 LAB — LACTIC ACID, PLASMA
Lactic Acid, Venous: 1.3 mmol/L (ref 0.5–1.9)
Lactic Acid, Venous: 0.9 mmol/L (ref 0.5–1.9)

## 2022-07-20 LAB — TROPONIN I (HIGH SENSITIVITY): Troponin I (High Sensitivity): 7 ng/L (ref <18)

## 2022-07-20 LAB — LIPASE, BLOOD: Lipase: 22 U/L (ref 11–51)

## 2022-07-20 LAB — PROCALCITONIN: Procalcitonin: 0.1 ng/mL

## 2022-07-20 LAB — MAGNESIUM: Magnesium: 1.6 mg/dL — ABNORMAL LOW (ref 1.7–2.4)

## 2022-07-20 MED ORDER — SODIUM CHLORIDE 0.9 % IV BOLUS
1000.0000 mL | Freq: Once | INTRAVENOUS | Status: AC
  Administered 2022-07-20: 1000 mL via INTRAVENOUS

## 2022-07-20 MED ORDER — IOHEXOL 300 MG/ML  SOLN
100.0000 mL | Freq: Once | INTRAMUSCULAR | Status: AC | PRN
  Administered 2022-07-20: 100 mL via INTRAVENOUS

## 2022-07-20 MED ORDER — ACETAMINOPHEN 500 MG PO TABS: 1000.0000 mg | ORAL_TABLET | Freq: Once | ORAL | Status: DC

## 2022-07-20 MED ORDER — VANCOMYCIN HCL 1500 MG/300ML IV SOLN
1500.0000 mg | Freq: Once | INTRAVENOUS | Status: AC
  Administered 2022-07-20: 1500 mg via INTRAVENOUS
  Filled 2022-07-20: qty 300

## 2022-07-20 MED ORDER — DIPHENHYDRAMINE HCL 50 MG/ML IJ SOLN
12.5000 mg | Freq: Once | INTRAMUSCULAR | Status: AC
  Administered 2022-07-20: 12.5 mg via INTRAVENOUS
  Filled 2022-07-20: qty 1

## 2022-07-20 MED ORDER — LORATADINE 10 MG PO TABS: 10.0000 mg | ORAL_TABLET | Freq: Every day | ORAL | Status: DC

## 2022-07-20 MED ORDER — POTASSIUM CHLORIDE 10 MEQ/100ML IV SOLN
10.0000 meq | INTRAVENOUS | Status: AC
  Administered 2022-07-20 (×2): 10 meq via INTRAVENOUS
  Filled 2022-07-20 (×2): qty 100

## 2022-07-20 MED ORDER — SODIUM CHLORIDE 0.9 % IV SOLN: INTRAVENOUS | Status: AC

## 2022-07-20 MED ORDER — ACETAMINOPHEN 10 MG/ML IV SOLN
1000.0000 mg | Freq: Once | INTRAVENOUS | Status: AC
  Administered 2022-07-20: 1000 mg via INTRAVENOUS
  Filled 2022-07-20: qty 100

## 2022-07-20 MED ORDER — SODIUM CHLORIDE 0.9 % IV BOLUS
500.0000 mL | Freq: Once | INTRAVENOUS | Status: AC
  Administered 2022-07-20: 500 mL via INTRAVENOUS

## 2022-07-20 MED ORDER — VANCOMYCIN HCL IN DEXTROSE 1-5 GM/200ML-% IV SOLN: 1000.0000 mg | Freq: Once | INTRAVENOUS | Status: DC

## 2022-07-20 MED ORDER — SODIUM CHLORIDE 0.9 % IV SOLN: INTRAVENOUS | Status: DC

## 2022-07-20 MED ORDER — MAGNESIUM SULFATE 2 GM/50ML IV SOLN
2.0000 g | Freq: Once | INTRAVENOUS | Status: AC
  Administered 2022-07-20: 2 g via INTRAVENOUS
  Filled 2022-07-20: qty 50

## 2022-07-20 MED ORDER — ACETAMINOPHEN 325 MG PO TABS
650.0000 mg | ORAL_TABLET | Freq: Four times a day (QID) | ORAL | Status: DC | PRN
  Administered 2022-07-21 – 2022-07-23 (×2): 650 mg via ORAL
  Filled 2022-07-20 (×2): qty 2

## 2022-07-20 MED ORDER — VANCOMYCIN HCL IN DEXTROSE 1-5 GM/200ML-% IV SOLN
1000.0000 mg | Freq: Two times a day (BID) | INTRAVENOUS | Status: DC
  Administered 2022-07-21 – 2022-07-22 (×3): 1000 mg via INTRAVENOUS
  Filled 2022-07-20 (×3): qty 200

## 2022-07-20 MED ORDER — METRONIDAZOLE 500 MG/100ML IV SOLN
500.0000 mg | Freq: Once | INTRAVENOUS | Status: AC
  Administered 2022-07-20: 500 mg via INTRAVENOUS
  Filled 2022-07-20: qty 100

## 2022-07-20 MED ORDER — ONDANSETRON HCL 4 MG/2ML IJ SOLN
4.0000 mg | Freq: Four times a day (QID) | INTRAMUSCULAR | Status: DC
  Administered 2022-07-21 (×3): 4 mg via INTRAVENOUS
  Filled 2022-07-20 (×3): qty 2

## 2022-07-20 MED ORDER — POTASSIUM CHLORIDE 10 MEQ/100ML IV SOLN: 10.0000 meq | INTRAVENOUS | Status: AC

## 2022-07-20 MED ORDER — SERTRALINE HCL 50 MG PO TABS: 50.0000 mg | ORAL_TABLET | Freq: Every day | ORAL | Status: DC

## 2022-07-20 MED ORDER — OXYCODONE HCL 5 MG PO TABS
10.0000 mg | ORAL_TABLET | Freq: Four times a day (QID) | ORAL | Status: DC | PRN
  Administered 2022-07-21 – 2022-07-22 (×2): 10 mg via ORAL
  Filled 2022-07-20 (×2): qty 2

## 2022-07-20 MED ORDER — LOPERAMIDE HCL 2 MG PO CAPS
4.0000 mg | ORAL_CAPSULE | ORAL | Status: DC | PRN
  Administered 2022-07-21 (×2): 4 mg via ORAL
  Filled 2022-07-20 (×2): qty 2

## 2022-07-20 MED ORDER — NAPROXEN 250 MG PO TABS
500.0000 mg | ORAL_TABLET | Freq: Two times a day (BID) | ORAL | Status: DC
  Administered 2022-07-20: 500 mg via ORAL
  Filled 2022-07-20 (×2): qty 2

## 2022-07-20 MED ORDER — ACETAMINOPHEN 650 MG RE SUPP: 650.0000 mg | Freq: Four times a day (QID) | RECTAL | Status: DC | PRN

## 2022-07-20 MED ORDER — SODIUM CHLORIDE 0.9 % IV SOLN
2.0000 g | Freq: Once | INTRAVENOUS | Status: AC
  Administered 2022-07-20: 2 g via INTRAVENOUS
  Filled 2022-07-20: qty 12.5

## 2022-07-20 MED ORDER — SODIUM CHLORIDE 0.9 % IV SOLN
2.0000 g | Freq: Three times a day (TID) | INTRAVENOUS | Status: DC
  Administered 2022-07-21 (×2): 2 g via INTRAVENOUS
  Filled 2022-07-20 (×2): qty 12.5

## 2022-07-20 MED ORDER — ENOXAPARIN SODIUM 40 MG/0.4ML IJ SOSY: 40.0000 mg | PREFILLED_SYRINGE | INTRAMUSCULAR | Status: DC

## 2022-07-20 MED ORDER — ONDANSETRON 4 MG PO TBDP
4.0000 mg | ORAL_TABLET | Freq: Once | ORAL | Status: AC | PRN
  Administered 2022-07-20: 4 mg via ORAL
  Filled 2022-07-20: qty 1

## 2022-07-20 NOTE — ED Notes (Signed)
Pt has been in ED for over 7 hours prior to sepsis activation.

## 2022-07-20 NOTE — Sepsis Progress Note (Addendum)
Elink monitoring for the code sepsis protocol.  Lactic & blood cultures collected at 1753, code sepsis ordered at 2016.

## 2022-07-20 NOTE — ED Notes (Signed)
Kaitlin Newcomer, RN charge nurse called to request we hold pt in ED until MEWS/vital signs have improved- informed Kaitlin Fleming that we would definitely do that and in process of giving antipyretic now for fever and HR. Merry Proud, ED charge made aware.

## 2022-07-20 NOTE — ED Triage Notes (Signed)
Pt here with reports of NVD and fever for the last 2 days. Pt reports fever of 102 at home. Took tylenol around 1000 today.

## 2022-07-20 NOTE — ED Provider Notes (Signed)
[facility] Provider Note   CSN: [id number] Arrival date & time: 07/20/22  1229     History {Add pertinent medical, surgical, social history, OB history to HPI:1} Chief Complaint  Patient presents with   Emesis   HPI Kaitlin Fleming is a 62 y.o. female with chronic pain and rheumatoid arthritis presenting for nausea, vomiting diarrhea.  Symptoms started 2 days ago.  Endorsing some lower abdominal discomfort.  Output is nonbloody.  Denies urinary changes.  States she did have a fever at home.  Her mother and her granddaughter also have had the same symptoms.  Patient states that she is tired and having difficulty keeping anything down.  Appetite is poor.   Emesis      Home Medications Prior to Admission medications   Medication Sig Start Date End Date Taking? Authorizing Provider  azelastine (ASTELIN) 0.1 % nasal spray Place 2 sprays into both nostrils 2 (two) times daily. Use in each nostril as directed 02/19/22   Larose Kells, MD  cholestyramine (QUESTRAN) 4 g packet Take 1 packet (4 g total) by mouth 2 (two) times daily. Mix with fluid or food 04/19/21   Ivy Lynn, NP  EPINEPHrine 0.3 mg/0.3 mL IJ SOAJ injection Inject 0.3 mg into the muscle as needed for anaphylaxis. 02/19/22   Larose Kells, MD  fexofenadine [facility] ALLERGY) 180 MG tablet Take 1 tablet (180 mg total) by mouth daily. 02/19/22   Larose Kells, MD  fluticasone (FLONASE) 50 MCG/ACT nasal spray Place 2 sprays into both nostrils daily. 02/19/22   Larose Kells, MD  hydrochlorothiazide (HYDRODIURIL) 12.5 MG tablet TAKE 1/2 TABLET DAILY 02/19/22   Ivy Lynn, NP  loratadine (CLARITIN) 10 MG tablet Take 1 tablet (10 mg total) by mouth at bedtime. 08/04/17   Barton Dubois, MD  oxyCODONE-acetaminophen (PERCOCET) 10-325 MG tablet Take 1 tablet by mouth every 6 (six) hours as needed for pain.     [provider]  predniSONE (DELTASONE) 5 MG tablet Take 1  tablet (5 mg total) by mouth daily. 06/27/20   Ivy Lynn, NP  sertraline (ZOLOFT) 50 MG tablet Take 1 tablet (50 mg total) by mouth daily. 08/19/19   Evelina Dun A, FNP  triamcinolone cream (KENALOG) 0.1 % Apply topically. 01/02/22   [provider]  Upadacitinib ER (RINVOQ) 15 MG TB24 Take 1 tablet by mouth daily. 11/16/19   [provider]      Allergies    Alpha-gal, Macrobid [nitrofurantoin], Pravastatin, Macrolides and ketolides, Methotrexate, Remicade [infliximab], Tofacitinib, Ibuprofen, Penicillins, and Sulfa antibiotics    Review of Systems   Review of Systems  Gastrointestinal:  Positive for vomiting.    Physical Exam   Vitals:   07/20/22 1342  BP: (!) 145/75  Pulse: (!) 115  Resp: 20  Temp: (!) 101.8 F (38.8 C)  SpO2: 94%    CONSTITUTIONAL:  well-appearing, NAD NEURO:  Alert and oriented x 3, CN 3-12 grossly intact EYES:  eyes equal and reactive ENT/NECK:  Supple, no stridor, dry mucous membranes CARDIO:  regular rate and rhythm, appears well-perfused  PULM:  No respiratory distress, CTAB GI/GU:  non-distended, soft, non tender MSK/SPINE:  No gross deformities, no edema, moves all extremities  SKIN:  no rash, atraumatic   *Additional and/or pertinent findings included in MDM below    ED Results / Procedures / Treatments   Labs (all labs ordered are listed, but only abnormal results are displayed) Labs Reviewed  LACTIC ACID, PLASMA  LACTIC ACID, PLASMA  COMPREHENSIVE METABOLIC PANEL  CBC WITH DIFFERENTIAL/PLATELET  URINALYSIS, ROUTINE W REFLEX MICROSCOPIC  LIPASE, BLOOD    EKG None  Radiology DG Chest 2 View  Result Date: 07/20/2022 CLINICAL DATA:  Nausea, vomiting, diarrhea and fever for the past 2 days. EXAM: CHEST - 2 VIEW COMPARISON:  03/15/2021 FINDINGS: Normal sized heart. Partially calcified thoracic aorta. Stable elevated right hemidiaphragm. Stable mild peribronchial thickening. Clear lungs with normal vascularity.  Unremarkable bones. IMPRESSION: No acute abnormality. Electronically Signed   By: Claudie Revering M.D.   On: 07/20/2022 14:02    Procedures Procedures  {Document cardiac monitor, telemetry assessment procedure when appropriate:1}  Medications Ordered in ED Medications  sodium chloride 0.9 % bolus 1,000 mL (has no administration in time range)  ondansetron (ZOFRAN-ODT) disintegrating tablet 4 mg (4 mg Oral Given 07/20/22 1403)    ED Course/ Medical Decision Making/ A&P Clinical Course as of 07/20/22 1837  Fri Jul 20, 2022  1825 ECG not crossing in epic, sinus tachycardia rate of 113, normal intervals, nonspecific ST-T's. [MB]    Clinical Course User Index [MB] Hayden Rasmussen, MD   {   Click here for ABCD2, HEART and other calculatorsREFRESH Note before signing :1}                          Medical Decision Making Amount and/or Complexity of Data Reviewed Labs: ordered. Radiology: ordered.  Risk Prescription drug management.   ***  {Document critical care time when appropriate:1} {Document review of labs and clinical decision tools ie heart score, Chads2Vasc2 etc:1}  {Document your independent review of radiology images, and any outside records:1} {Document your discussion with family members, caretakers, and with consultants:1} {Document social determinants of health affecting pt's care:1} {Document your decision making why or why not admission, treatments were needed:1} Final Clinical Impression(s) / ED Diagnoses Final diagnoses:  None    Rx / DC Orders ED Discharge Orders     None

## 2022-07-20 NOTE — Progress Notes (Signed)
Pharmacy Antibiotic Note  Kaitlin Fleming is a 62 y.o. female admitted on 07/20/2022 with  fever, N/V/D x 2 days .  Concerned for sepsis, unknown source.  Pharmacy has been consulted for Vancomycin and Cefepime dosing.  Plan: Cefepime 2gm IV q8h Vanc '1500mg'$  IV x 1, then 1gm IV q12h  (eAUC 559, Vd 0.72, SCr 0.8) Follow-up cx data, SCr, and clinical progress. Levels as needed.    Temp (24hrs), Avg:101.4 F (38.6 C), Min:100.1 F (37.8 C), Max:102.4 F (39.1 C)  Recent Labs  Lab 07/20/22 1412 07/20/22 1753  WBC 7.6  --   CREATININE 0.64  --   LATICACIDVEN 1.3 0.9    Estimated Creatinine Clearance: 78.7 mL/min (by C-G formula based on SCr of 0.64 mg/dL).    Allergies  Allergen Reactions   Alpha-Gal Anaphylaxis, Swelling, Hives and Other (See Comments)    Serology-proven Serology-proven    Macrobid [Nitrofurantoin] Shortness Of Breath and Rash   Pravastatin Other (See Comments) and Nausea And Vomiting    Rash & heart racing. Rash & heart racing. Rash & heart racing.   Macrolides And Ketolides    Methotrexate    Remicade [Infliximab]    Tofacitinib Other (See Comments)    Upper respiratory infections   Ibuprofen Hives   Penicillins Other (See Comments)    Breaks out Has patient had a PCN reaction causing immediate rash, facial/tongue/throat swelling, SOB or lightheadedness with hypotension: yes Has patient had a PCN reaction causing severe rash involving mucus membranes or skin necrosis: no Has patient had a PCN reaction that required hospitalization: no Has patient had a PCN reaction occurring within the last 10 years:no If all of the above answers are "NO", then may proceed with Cephalosporin use.   Sulfa Antibiotics Other (See Comments)    Breaks out    Antimicrobials this admission: Vanc 3/8 >>  Cefepime 3/8 >>   Dose adjustments this admission:   Microbiology results:   Thank you for allowing pharmacy to be a part of this patient's care.  Elmer Ramp 07/20/2022 8:33 PM

## 2022-07-20 NOTE — H&P (Addendum)
History and Physical    Patient: Kaitlin Fleming [id number] DOB: July 08, 1960 DOA: 07/20/2022 DOS: the patient was seen and examined on 07/20/2022 PCP: Ivy Lynn, NP (Inactive)  Patient coming from: Home  Chief Complaint:  Chief Complaint  Patient presents with   Emesis   HPI: Kaitlin Fleming is a 62 y.o. female with medical history significant of rheumatoid arthritis, GERD, depression, hyperlipidemia.  Patient here with 3 days of nausea, vomiting, diarrhea with fevers.  She has been unable to take any of her medications.  She has been unable to tolerate oral food and liquids without having symptoms.  She comes into be assessed for these things.  No new medications.  No recent antibiotics.  Has not traveled.  Her mother is sick with something similar. She denies SOB, chest pain, Abdominal pain.  Review of Systems: As mentioned in the history of present illness. All other systems reviewed and are negative. Past Medical History:  Diagnosis Date   Allergy to alpha-gal    Chronic back pain    stenosis   Chronic pain    takes Lexapro daily   Complication of anesthesia    pt states after surgery in 2000 had to wear a heart monitor for 3 days.   DDD (degenerative disc disease)    Depression    GERD (gastroesophageal reflux disease)    takes OTC meds if needed   History of blood transfusion    as a child   History of bronchitis 106yr ago   Hyperlipidemia    takes Fenofibrate daily   Joint pain    Joint swelling    Kidney stone    has one right now on the right side but not giving any problems   Osteoarthritis    Peripheral edema    takes HCTZ daily    Pneumonia 365yrago   hx of   PONV (postoperative nausea and vomiting)    Rheumatoid arthritis (HCC)    Rheumatoid arthritis (HCC)    Weakness    and tingling on left side   Past Surgical History:  Procedure Laterality Date   ABDOMINAL HYSTERECTOMY  05/14/1998   BACK SURGERY  03/10/2014   lumbar fusion   BASAL CELL  CARCINOMA EXCISION  11/2021   LUMBAR LAMINECTOMY/DECOMPRESSION MICRODISCECTOMY Left 12/08/2013   Procedure: LEFT LUMBAR FOUR-FIVEW, LUMBAR FIVE-SACRAL ONE LAMINECTOMY;  Surgeon: ErFloyce StakesMD;  Location: [facility];  Service: Neurosurgery;  Laterality: Left;   TUBAL LIGATION  24 yrs ago   Social History:  reports that she has never smoked. She has never used smokeless tobacco. She reports that she does not drink alcohol and does not use drugs.  Allergies  Allergen Reactions   Alpha-Gal Anaphylaxis, Swelling, Hives and Other (See Comments)    Serology-proven Serology-proven    Macrobid [Nitrofurantoin] Shortness Of Breath and Rash   Pravastatin Other (See Comments) and Nausea And Vomiting    Rash & heart racing. Rash & heart racing. Rash & heart racing.   Macrolides And Ketolides    Methotrexate    Remicade [Infliximab]    Tofacitinib Other (See Comments)    Upper respiratory infections   Ibuprofen Hives   Penicillins Other (See Comments)    Breaks out Has patient had a PCN reaction causing immediate rash, facial/tongue/throat swelling, SOB or lightheadedness with hypotension: yes Has patient had a PCN reaction causing severe rash involving mucus membranes or skin necrosis: no Has patient had a PCN reaction that required hospitalization: no Has patient had  a PCN reaction occurring within the last 10 years:no If all of the above answers are "NO", then may proceed with Cephalosporin use.   Sulfa Antibiotics Other (See Comments)    Breaks out    Family History  Problem Relation Age of Onset   Colon cancer Maternal Grandmother    Colon cancer Paternal Uncle     Prior to Admission medications   Medication Sig Start Date End Date Taking? Authorizing Provider  azelastine (ASTELIN) 0.1 % nasal spray Place 2 sprays into both nostrils 2 (two) times daily. Use in each nostril as directed 02/19/22   Larose Kells, MD  cholestyramine (QUESTRAN) 4 g packet Take 1 packet (4 g  total) by mouth 2 (two) times daily. Mix with fluid or food 04/19/21   Ivy Lynn, NP  EPINEPHrine 0.3 mg/0.3 mL IJ SOAJ injection Inject 0.3 mg into the muscle as needed for anaphylaxis. 02/19/22   Larose Kells, MD  fexofenadine [facility] ALLERGY) 180 MG tablet Take 1 tablet (180 mg total) by mouth daily. 02/19/22   Larose Kells, MD  fluticasone (FLONASE) 50 MCG/ACT nasal spray Place 2 sprays into both nostrils daily. 02/19/22   Larose Kells, MD  hydrochlorothiazide (HYDRODIURIL) 12.5 MG tablet TAKE 1/2 TABLET DAILY 02/19/22   Ivy Lynn, NP  loratadine (CLARITIN) 10 MG tablet Take 1 tablet (10 mg total) by mouth at bedtime. 08/04/17   Barton Dubois, MD  oxyCODONE-acetaminophen (PERCOCET) 10-325 MG tablet Take 1 tablet by mouth every 6 (six) hours as needed for pain.     [provider]  predniSONE (DELTASONE) 5 MG tablet Take 1 tablet (5 mg total) by mouth daily. 06/27/20   Ivy Lynn, NP  sertraline (ZOLOFT) 50 MG tablet Take 1 tablet (50 mg total) by mouth daily. 08/19/19   Evelina Dun A, FNP  triamcinolone cream (KENALOG) 0.1 % Apply topically. 01/02/22   [provider]  Upadacitinib ER (RINVOQ) 15 MG TB24 Take 1 tablet by mouth daily. 11/16/19   [provider]    Physical Exam: Vitals:   07/20/22 1900 07/20/22 2005 07/20/22 2050 07/20/22 2100  BP: 139/66 (!) 110/51 131/67 119/65  Pulse: (!) 116 (!) 127 (!) 131 (!) 130  Resp: (!) 22 (!) 25 (!) 22 (!) 22  Temp:   (!) 102.9 F (39.4 C)   TempSrc:   Oral   SpO2: 96% 96% 94% 94%   General: Elderly female. Awake and alert and oriented x3. No acute cardiopulmonary distress.  HEENT: Normocephalic atraumatic.  Right and left ears normal in appearance.  Pupils equal, round, reactive to light. Extraocular muscles are intact. Sclerae anicteric and noninjected.  Moist mucosal membranes. No mucosal lesions.  Neck: Neck supple without lymphadenopathy. No carotid bruits. No masses palpated.   Cardiovascular: Tachycardic rate with normal S1-S2 sounds. No murmurs, rubs, gallops auscultated. No JVD.  Respiratory: Tachypneic respiratory effort with no wheezes, rales, rhonchi. Lungs clear to auscultation bilaterally.  No accessory muscle use. Abdomen: Soft, nontender, nondistended. Active bowel sounds. No masses or hepatosplenomegaly  Skin: No rashes, lesions, or ulcerations.  Dry, warm to touch. 2+ dorsalis pedis and radial pulses. Musculoskeletal: No calf or leg pain. All major joints not erythematous nontender.  No upper or lower joint deformation.  Good ROM.  No contractures  Psychiatric: Intact judgment and insight. Pleasant and cooperative. Neurologic: No focal neurological deficits. Strength is 5/5 and symmetric in upper and lower extremities.  Cranial nerves II through XII are grossly intact.  Data Reviewed: Results for orders placed or performed during the hospital encounter of 07/20/22 (from the past 24 hour(s))  Lactic acid, plasma     Status: None   Collection Time: 07/20/22  2:12 PM  Result Value Ref Range   Lactic Acid, Venous 1.3 0.5 - 1.9 mmol/L  Comprehensive metabolic panel     Status: Abnormal   Collection Time: 07/20/22  2:12 PM  Result Value Ref Range   Sodium 135 135 - 145 mmol/L   Potassium 2.5 (LL) 3.5 - 5.1 mmol/L   Chloride 101 98 - 111 mmol/L   CO2 25 22 - 32 mmol/L   Glucose, Bld 111 (H) 70 - 99 mg/dL   BUN 8 8 - 23 mg/dL   Creatinine, Ser 0.64 0.44 - 1.00 mg/dL   Calcium 8.2 (L) 8.9 - 10.3 mg/dL   Total Protein 7.5 6.5 - 8.1 g/dL   Albumin 3.6 3.5 - 5.0 g/dL   AST 18 15 - 41 U/L   ALT 14 0 - 44 U/L   Alkaline Phosphatase 74 38 - 126 U/L   Total Bilirubin 0.5 0.3 - 1.2 mg/dL   GFR, Estimated >60 >60 mL/min   Anion gap 9 5 - 15  CBC with Differential     Status: Abnormal   Collection Time: 07/20/22  2:12 PM  Result Value Ref Range   WBC 7.6 4.0 - 10.5 K/uL   RBC 3.98 3.87 - 5.11 MIL/uL   Hemoglobin 11.8 (L) 12.0 - 15.0 g/dL   HCT 37.2 36.0 -  46.0 %   MCV 93.5 80.0 - 100.0 fL   MCH 29.6 26.0 - 34.0 pg   MCHC 31.7 30.0 - 36.0 g/dL   RDW 12.3 11.5 - 15.5 %   Platelets 189 150 - 400 K/uL   nRBC 0.0 0.0 - 0.2 %   Neutrophils Relative % 90 %   Neutro Abs 6.7 1.7 - 7.7 K/uL   Lymphocytes Relative 5 %   Lymphs Abs 0.4 (L) 0.7 - 4.0 K/uL   Monocytes Relative 5 %   Monocytes Absolute 0.4 0.1 - 1.0 K/uL   Eosinophils Relative 0 %   Eosinophils Absolute 0.0 0.0 - 0.5 K/uL   Basophils Relative 0 %   Basophils Absolute 0.0 0.0 - 0.1 K/uL   Immature Granulocytes 0 %   Abs Immature Granulocytes 0.02 0.00 - 0.07 K/uL  Lipase, blood     Status: None   Collection Time: 07/20/22  2:12 PM  Result Value Ref Range   Lipase 22 11 - 51 U/L  Resp panel by RT-PCR (RSV, Flu A&B, Covid) Anterior Nasal Swab     Status: None   Collection Time: 07/20/22  4:35 PM   Specimen: Anterior Nasal Swab  Result Value Ref Range   SARS Coronavirus 2 by RT PCR NEGATIVE NEGATIVE   Influenza A by PCR NEGATIVE NEGATIVE   Influenza B by PCR NEGATIVE NEGATIVE   Resp Syncytial Virus by PCR NEGATIVE NEGATIVE  Urinalysis, Routine w reflex microscopic -Urine, Clean Catch     Status: Abnormal   Collection Time: 07/20/22  5:10 PM  Result Value Ref Range   Color, Urine YELLOW YELLOW   APPearance CLEAR CLEAR   Specific Gravity, Urine 1.023 1.005 - 1.030   pH 5.0 5.0 - 8.0   Glucose, UA NEGATIVE NEGATIVE mg/dL   Hgb urine dipstick SMALL (A) NEGATIVE   Bilirubin Urine NEGATIVE NEGATIVE   Ketones, ur NEGATIVE NEGATIVE mg/dL   Protein, ur NEGATIVE NEGATIVE mg/dL  Nitrite NEGATIVE NEGATIVE   Leukocytes,Ua TRACE (A) NEGATIVE   RBC / HPF 0-5 0 - 5 RBC/hpf   WBC, UA 0-5 0 - 5 WBC/hpf   Bacteria, UA NONE SEEN NONE SEEN   Squamous Epithelial / HPF 0-5 0 - 5 /HPF   Mucus PRESENT    Hyaline Casts, UA PRESENT   Procalcitonin     Status: None   Collection Time: 07/20/22  5:49 PM  Result Value Ref Range   Procalcitonin 0.10 ng/mL  Magnesium     Status: Abnormal    Collection Time: 07/20/22  5:49 PM  Result Value Ref Range   Magnesium 1.6 (L) 1.7 - 2.4 mg/dL  Lactic acid, plasma     Status: None   Collection Time: 07/20/22  5:53 PM  Result Value Ref Range   Lactic Acid, Venous 0.9 0.5 - 1.9 mmol/L  Blood culture (routine x 2)     Status: None (Preliminary result)   Collection Time: 07/20/22  5:53 PM   Specimen: Site Not Specified; Blood  Result Value Ref Range   Specimen Description      SITE NOT SPECIFIED BOTTLES DRAWN AEROBIC AND ANAEROBIC   Special Requests Blood Culture adequate volume    Culture      NO GROWTH <12 HOURS Performed at [facility], [address], [place], [place] [address]    Report Status PENDING   Blood culture (routine x 2)     Status: None (Preliminary result)   Collection Time: 07/20/22  5:53 PM   Specimen: Site Not Specified; Blood  Result Value Ref Range   Specimen Description      SITE NOT SPECIFIED BOTTLES DRAWN AEROBIC AND ANAEROBIC   Special Requests Blood Culture adequate volume    Culture      NO GROWTH <12 HOURS Performed at [facility], [address] [place], [place] [address]    Report Status PENDING   Troponin I (High Sensitivity)     Status: None   Collection Time: 07/20/22  5:53 PM  Result Value Ref Range   Troponin I (High Sensitivity) 7 <18 ng/L   CT Abdomen Pelvis W Contrast  Result Date: 07/20/2022 CLINICAL DATA:  Sepsis.  Emesis.  Fever. EXAM: CT ABDOMEN AND PELVIS WITH CONTRAST TECHNIQUE: Multidetector CT imaging of the abdomen and pelvis was performed using the standard protocol following bolus administration of intravenous contrast. RADIATION DOSE REDUCTION: This exam was performed according to the departmental dose-optimization program which includes automated exposure control, adjustment of the mA and/or kV according to patient size and/or use of iterative reconstruction technique. CONTRAST:  121m OMNIPAQUE IOHEXOL 300 MG/ML  SOLN COMPARISON:  Ultrasound 06/04/2019 and CT report from  11/17/2007 FINDINGS: Lower chest: Elevated right hemidiaphragm. Aortic and left anterior descending coronary artery atherosclerotic vascular calcification. Mild atelectasis along the right hemidiaphragm. Suspected atelectasis in the posterior basal segment right lower lobe. Hepatobiliary: Unremarkable Pancreas: Unremarkable Spleen: Unremarkable Adrenals/Urinary Tract: Trace gas in the urinary bladder, query recent catheterization. The kidneys and adrenal glands appear normal. Stomach/Bowel: Transverse duodenal diverticulum without findings of inflammation. Normal appendix. Fluid density in the distal large bowel and rectum compatible with diarrheal process. There are a few scattered diverticula of the descending and proximal sigmoid colon. No pneumatosis or discrete bowel wall thickening. Vascular/Lymphatic: Atherosclerosis is present, including aortoiliac atherosclerotic disease. There is atheromatous plaque at the origins of the celiac artery and SMA with narrowing of the celiac artery noted, some of this may be related to the median arcuate ligament.  Today's exam was not a CT angiogram. Both vessels appear grossly patent with luminal contrast. Reproductive: Uterus absent.  Adnexa unremarkable. Other: No supplemental non-categorized findings. Musculoskeletal: Posterolateral rod and pedicle screw fixation at L4-L5-S1 with posterior decompression at these levels, as well as interbody spacers. A pulse generator is observed with wiring along the right posterior elements and right posterior decompression region. Remote L1 superior endplate compression fracture. Suspected left foraminal impingement at L2-3 due to left foraminal disc protrusion. IMPRESSION: 1. Fluid density in the distal large bowel and rectum compatible with diarrheal process. No pneumatosis or discrete bowel wall thickening. 2. Elevated right hemidiaphragm with mild atelectasis in the right lower lobe. 3. Trace gas in the urinary bladder, query recent  catheterization. 4. Suspected left foraminal impingement at L2-3 due to left foraminal disc protrusion. 5. Suspected narrowing of the celiac artery due to atheromatous plaque, some of this may be related to the median arcuate ligament. 6. Aortic atherosclerosis. Aortic Atherosclerosis (ICD10-I70.0). Electronically Signed   By: Van Clines M.D.   On: 07/20/2022 17:06   DG Chest 2 View  Result Date: 07/20/2022 CLINICAL DATA:  Nausea, vomiting, diarrhea and fever for the past 2 days. EXAM: CHEST - 2 VIEW COMPARISON:  03/15/2021 FINDINGS: Normal sized heart. Partially calcified thoracic aorta. Stable elevated right hemidiaphragm. Stable mild peribronchial thickening. Clear lungs with normal vascularity. Unremarkable bones. IMPRESSION: No acute abnormality. Electronically Signed   By: Claudie Revering M.D.   On: 07/20/2022 14:02     Assessment and Plan: No notes have been filed under this hospital service. Service: Hospitalist  Principal Problem:   Severe sepsis ([place]) Active Problems:   Rheumatoid arthritis ([place])   GERD (gastroesophageal reflux disease)   Gastroenteritis  Severe sepsis Admit to stepdown IV fluids Blood cultures pending Broad-spectrum antibiotics Check procalcitonin Gastroenteritis IV fluids Will give Zofran and antidiarrheals Tylenol/NSAIDs for fever control Hypokalemia  Secondary to diarrhea Replace potassium\ Recheck in AM Rheumatoid arthritis Hold immunosuppressants GERD   Advance Care Planning:   Code Status: Full Code confirmed by patient  Consults: None  Family Communication: Husband present during interview and exam  Severity of Illness: The appropriate patient status for this patient is INPATIENT. Inpatient status is judged to be reasonable and necessary in order to provide the required intensity of service to ensure the patient's safety. The patient's presenting symptoms, physical exam findings, and initial radiographic and laboratory data in the  context of their chronic comorbidities is felt to place them at high risk for further clinical deterioration. Furthermore, it is not anticipated that the patient will be medically stable for discharge from the hospital within 2 midnights of admission.   * I certify that at the point of admission it is my clinical judgment that the patient will require inpatient hospital care spanning beyond 2 midnights from the point of admission due to high intensity of service, high risk for further deterioration and high frequency of surveillance required.*  Author: Truett Mainland, DO 07/20/2022 9:53 PM  For on call review [email or web address].

## 2022-07-20 NOTE — ED Notes (Signed)
Dr Nehemiah Settle notified of pt oral temp -102.9

## 2022-07-20 NOTE — ED Notes (Signed)
John, PA, notified of K+ 2.5.

## 2022-07-21 ENCOUNTER — Other Ambulatory Visit: Payer: Self-pay

## 2022-07-21 DIAGNOSIS — A419 Sepsis, unspecified organism: Secondary | ICD-10-CM | POA: Diagnosis not present

## 2022-07-21 DIAGNOSIS — R6520 Severe sepsis without septic shock: Secondary | ICD-10-CM

## 2022-07-21 LAB — GASTROINTESTINAL PANEL BY PCR, STOOL (REPLACES STOOL CULTURE)

## 2022-07-21 LAB — BASIC METABOLIC PANEL
Anion gap: 9 (ref 5–15)
BUN: 12 mg/dL (ref 8–23)
CO2: 20 mmol/L — ABNORMAL LOW (ref 22–32)
Calcium: 7.6 mg/dL — ABNORMAL LOW (ref 8.9–10.3)
Chloride: 109 mmol/L (ref 98–111)
Creatinine, Ser: 0.74 mg/dL (ref 0.44–1.00)
GFR, Estimated: >60 mL/min (ref >60)
Glucose, Bld: 102 mg/dL — ABNORMAL HIGH (ref 70–99)
Potassium: 2.3 mmol/L — CL (ref 3.5–5.1)
Sodium: 138 mmol/L (ref 135–145)

## 2022-07-21 LAB — CBC
HCT: 32.5 % — ABNORMAL LOW (ref 36.0–46.0)
Hemoglobin: 10.5 g/dL — ABNORMAL LOW (ref 12.0–15.0)
MCH: 29.7 pg (ref 26.0–34.0)
MCHC: 32.3 g/dL (ref 30.0–36.0)
MCV: 92.1 fL (ref 80.0–100.0)
Platelets: 150 10*3/uL (ref 150–400)
RBC: 3.53 MIL/uL — ABNORMAL LOW (ref 3.87–5.11)
RDW: 12.7 % (ref 11.5–15.5)
WBC: 5.4 10*3/uL (ref 4.0–10.5)
nRBC: 0 % (ref 0.0–0.2)

## 2022-07-21 LAB — HIV ANTIBODY (ROUTINE TESTING W REFLEX): HIV Screen 4th Generation wRfx: NONREACTIVE

## 2022-07-21 MED ORDER — PANTOPRAZOLE SODIUM 40 MG PO TBEC
40.0000 mg | DELAYED_RELEASE_TABLET | Freq: Once | ORAL | Status: AC
  Administered 2022-07-21: 40 mg via ORAL
  Filled 2022-07-21: qty 1

## 2022-07-21 MED ORDER — SERTRALINE HCL 50 MG PO TABS
100.0000 mg | ORAL_TABLET | Freq: Every day | ORAL | Status: DC
  Administered 2022-07-21 – 2022-07-23 (×3): 100 mg via ORAL
  Filled 2022-07-21 (×3): qty 2

## 2022-07-21 MED ORDER — POTASSIUM CHLORIDE CRYS ER 20 MEQ PO TBCR
40.0000 meq | EXTENDED_RELEASE_TABLET | ORAL | Status: AC
  Administered 2022-07-21: 40 meq via ORAL
  Filled 2022-07-21: qty 2

## 2022-07-21 MED ORDER — POTASSIUM CHLORIDE 10 MEQ/100ML IV SOLN
10.0000 meq | INTRAVENOUS | Status: AC
  Administered 2022-07-21 (×4): 10 meq via INTRAVENOUS
  Filled 2022-07-21 (×4): qty 100

## 2022-07-21 MED ORDER — CALCIUM CARBONATE ANTACID 500 MG PO CHEW
400.0000 mg | CHEWABLE_TABLET | Freq: Two times a day (BID) | ORAL | Status: DC
  Administered 2022-07-21 – 2022-07-23 (×4): 400 mg via ORAL
  Filled 2022-07-21 (×4): qty 2

## 2022-07-21 MED ORDER — FAMOTIDINE 20 MG PO TABS
20.0000 mg | ORAL_TABLET | Freq: Every day | ORAL | Status: DC
  Administered 2022-07-22: 20 mg via ORAL
  Filled 2022-07-21 (×2): qty 1

## 2022-07-21 MED ORDER — LORATADINE 10 MG PO TABS
10.0000 mg | ORAL_TABLET | Freq: Every day | ORAL | Status: DC
  Administered 2022-07-21 – 2022-07-22 (×2): 10 mg via ORAL
  Filled 2022-07-21 (×2): qty 1

## 2022-07-21 MED ORDER — LOPERAMIDE HCL 2 MG PO CAPS: 4.0000 mg | ORAL_CAPSULE | Freq: Four times a day (QID) | ORAL | Status: DC | PRN

## 2022-07-21 NOTE — TOC Initial Note (Signed)
Transition of Care [facility]) - Initial/Assessment Note    Patient Details  Name: Kaitlin Fleming MRN: [id number] Date of Birth: 12/06/1960  Transition of Care (TOC) CM/SW Contact:    Joaquin Courts, RN Phone Number: 07/21/2022, 9:19 AM    Transition of Care (TOC) Screening Note      Transition of Care Department [facility]) has reviewed patient and no TOC needs have been identified at this time. We will continue to monitor patient advancement through interdisciplinary progression rounds. If new patient transition needs arise, please place a TOC consult.

## 2022-07-21 NOTE — ED Notes (Signed)
Called to 3rd floor as report to [place] had been given earlier, sec on floor reports Lovey Newcomer will not be the nurse as room has changed, but she will have nurse call me back.

## 2022-07-21 NOTE — Progress Notes (Signed)
PROGRESS NOTE     Kaitlin Fleming, is a 62 y.o. female, DOB - [birth year], [id number]  Admit date - 07/20/2022   Admitting Physician Truett Mainland, DO  Outpatient Primary MD for the patient is Ivy Lynn, NP (Inactive)  LOS - 1  Chief Complaint  Patient presents with   Emesis        Brief Narrative:  62 y.o. female with medical history significant of rheumatoid arthritis, GERD, depression, hyperlipidemia admitted on 07/20/2022 with rotavirus gastroenteritis with electrolyte abnormalities    -Assessment and Plan: 1)Hypokalemia/Hypomagnesemia with Metabolic Acidosis----due to diarrhea in the setting of rotavirus infection Potassium 2.5 >> 2.3  Mag- 1.6 -Replace orally and IV -Recheck electrolytes after further replacements  2)Rotavirus Gastroenteritis--- GI pathogen/panel/culture noted with rotavirus -Patient's mother is currently admitted at [facility] with similar symptoms -Supportive care and IV fluids and electrolyte replacement as above 1 -Loperamide as needed -Zofran as needed -Ruled out for sepsis--- Stop vancomycin Flagyl and cefepime Met SIRS criteria on admission with a Tmax of 102.9, respiratory rate of 27 and heart rate of 131 on admission  3) rheumatoid arthritis--- hold immunosuppressants at this time  4)Depression--- continue Zoloft  5)GERD--- continue Pepcid and Tums   Status is: Inpatient   Disposition: The patient is from: Home              Anticipated d/c is to: Home              Anticipated d/c date is: 1 day              Patient currently is not medically stable to d/c. Barriers: Not Clinically Stable-   Code Status :  -  Code Status: Full Code   Family Communication:    NA (patient is alert, awake and coherent)   DVT Prophylaxis  :   - SCDs   Place and maintain sequential compression device Start: 07/21/22 0959   Lab Results  Component Value Date   PLT 150 07/21/2022    Inpatient Medications  Scheduled Meds:   ondansetron (ZOFRAN) IV  4 mg Intravenous Q6H   potassium chloride  40 mEq Oral Q3H   sertraline  100 mg Oral Daily   Continuous Infusions:  sodium chloride Stopped (07/21/22 0919)   ceFEPime (MAXIPIME) IV Stopped (07/21/22 1351)   vancomycin Stopped (07/21/22 0857)   PRN Meds:.acetaminophen **OR** acetaminophen, loperamide, oxyCODONE   Anti-infectives (From admission, onward)    Start     Dose/Rate Route Frequency Ordered Stop   07/21/22 0800  vancomycin (VANCOCIN) IVPB 1000 mg/200 mL premix        1,000 mg 200 mL/hr over 60 Minutes Intravenous Every 12 hours 07/20/22 2037 07/27/22 1959   07/21/22 0600  ceFEPIme (MAXIPIME) 2 g in sodium chloride 0.9 % 100 mL IVPB        2 g 200 mL/hr over 30 Minutes Intravenous Every 8 hours 07/20/22 2037 07/27/22 2159   07/20/22 2045  vancomycin (VANCOREADY) IVPB 1500 mg/300 mL        1,500 mg 150 mL/hr over 120 Minutes Intravenous  Once 07/20/22 2032 07/20/22 2331   07/20/22 2030  ceFEPIme (MAXIPIME) 2 g in sodium chloride 0.9 % 100 mL IVPB        2 g 200 mL/hr over 30 Minutes Intravenous  Once 07/20/22 2016 07/20/22 2113   07/20/22 2030  metroNIDAZOLE (FLAGYL) IVPB 500 mg        500 mg 100 mL/hr over 60 Minutes Intravenous  Once  07/20/22 2016 07/20/22 2143   07/20/22 2030  vancomycin (VANCOCIN) IVPB 1000 mg/200 mL premix  Status:  Discontinued        1,000 mg 200 mL/hr over 60 Minutes Intravenous  Once 07/20/22 2016 07/20/22 2031         Subjective: Alaira Bostrom today has no fevers,   No chest pain,   - Diarrhea persist--frequent large-volume loose stools -Nausea persist but no emesis   Objective: Vitals:   07/21/22 0050 07/21/22 0054 07/21/22 0541 07/21/22 1414  BP:  109/62 (!) 109/53 100/84  Pulse:  (!) 106 90 90  Resp:  20 20   Temp:  99.6 F (37.6 C) 98.3 F (36.8 C) 98.9 F (37.2 C)  TempSrc:  Oral Oral Oral  SpO2:  97% 99% 100%  Weight: 81.1 kg     Height: '5\' 7"'$  (1.702 m)       Intake/Output Summary (Last 24  hours) at 07/21/2022 1816 Last data filed at 07/21/2022 1733 Gross per 24 hour  Intake 2588.68 ml  Output 900 ml  Net 1688.68 ml   Filed Weights   07/21/22 0050  Weight: 81.1 kg    Physical Exam  Gen:- Awake Alert,  in no apparent distress  HEENT:- [place].AT, No sclera icterus Neck-Supple Neck,No JVD,.  Lungs-  CTAB , fair symmetrical air movement CV- S1, S2 normal, regular  Abd-  +ve B.Sounds, Abd Soft, No tenderness,    Extremity/Skin:- No  edema, pedal pulses present  Psych-affect is appropriate, oriented x3 Neuro-no new focal deficits, no tremors  Data Reviewed: I have personally reviewed following labs and imaging studies  CBC: Recent Labs  Lab 07/20/22 1412 07/21/22 0534  WBC 7.6 5.4  NEUTROABS 6.7  --   HGB 11.8* 10.5*  HCT 37.2 32.5*  MCV 93.5 92.1  PLT 189 [id number]   Basic Metabolic Panel: Recent Labs  Lab 07/20/22 1412 07/20/22 1749 07/21/22 0534  NA 135  --  138  K 2.5*  --  2.3*  CL 101  --  109  CO2 25  --  20*  GLUCOSE 111*  --  102*  BUN 8  --  12  CREATININE 0.64  --  0.74  CALCIUM 8.2*  --  7.6*  MG  --  1.6*  --    GFR: Estimated Creatinine Clearance: 79.9 mL/min (by C-G formula based on SCr of 0.74 mg/dL). Liver Function Tests: Recent Labs  Lab 07/20/22 1412  AST 18  ALT 14  ALKPHOS 74  BILITOT 0.5  PROT 7.5  ALBUMIN 3.6   Recent Results (from the past 240 hour(s))  Resp panel by RT-PCR (RSV, Flu A&B, Covid) Anterior Nasal Swab     Status: None   Collection Time: 07/20/22  4:35 PM   Specimen: Anterior Nasal Swab  Result Value Ref Range Status   SARS Coronavirus 2 by RT PCR NEGATIVE NEGATIVE Final    Comment: (NOTE) SARS-CoV-2 target nucleic acids are NOT DETECTED.  The SARS-CoV-2 RNA is generally detectable in upper respiratory specimens during the acute phase of infection. The lowest concentration of SARS-CoV-2 viral copies this assay can detect is 138 copies/mL. A negative result does not preclude SARS-Cov-2 infection and should  not be used as the sole basis for treatment or other patient management decisions. A negative result may occur with  improper specimen collection/handling, submission of specimen other than nasopharyngeal swab, presence of viral mutation(s) within the areas targeted by this assay, and inadequate number of viral copies(<138 copies/mL). A negative  result must be combined with clinical observations, patient history, and epidemiological information. The expected result is Negative.  Fact Sheet for Patients:  EntrepreneurPulse.com.au  Fact Sheet for Healthcare Providers:  IncredibleEmployment.be  This test is no t yet approved or cleared by the [place] FDA and  has been authorized for detection and/or diagnosis of SARS-CoV-2 by FDA under an Emergency Use Authorization (EUA). This EUA will remain  in effect (meaning this test can be used) for the duration of the COVID-19 declaration under Section 564(b)(1) of the Act, 21 U.S.C.section 360bbb-3(b)(1), unless the authorization is terminated  or revoked sooner.       Influenza A by PCR NEGATIVE NEGATIVE Final   Influenza B by PCR NEGATIVE NEGATIVE Final    Comment: (NOTE) The Xpert Xpress SARS-CoV-2/FLU/RSV plus assay is intended as an aid in the diagnosis of influenza from Nasopharyngeal swab specimens and should not be used as a sole basis for treatment. Nasal washings and aspirates are unacceptable for Xpert Xpress SARS-CoV-2/FLU/RSV testing.  Fact Sheet for Patients: EntrepreneurPulse.com.au  Fact Sheet for Healthcare Providers: IncredibleEmployment.be  This test is not yet approved or cleared by the [place] FDA and has been authorized for detection and/or diagnosis of SARS-CoV-2 by FDA under an Emergency Use Authorization (EUA). This EUA will remain in effect (meaning this test can be used) for the duration of the COVID-19 declaration under  Section 564(b)(1) of the Act, 21 U.S.C. section 360bbb-3(b)(1), unless the authorization is terminated or revoked.     Resp Syncytial Virus by PCR NEGATIVE NEGATIVE Final    Comment: (NOTE) Fact Sheet for Patients: EntrepreneurPulse.com.au  Fact Sheet for Healthcare Providers: IncredibleEmployment.be  This test is not yet approved or cleared by the [place] FDA and has been authorized for detection and/or diagnosis of SARS-CoV-2 by FDA under an Emergency Use Authorization (EUA). This EUA will remain in effect (meaning this test can be used) for the duration of the COVID-19 declaration under Section 564(b)(1) of the Act, 21 U.S.C. section 360bbb-3(b)(1), unless the authorization is terminated or revoked.  Performed at [facility], [address], [place], [place] [address]   Blood culture (routine x 2)     Status: None (Preliminary result)   Collection Time: 07/20/22  5:53 PM   Specimen: Site Not Specified; Blood  Result Value Ref Range Status   Specimen Description   Final    SITE NOT SPECIFIED BOTTLES DRAWN AEROBIC AND ANAEROBIC   Special Requests Blood Culture adequate volume  Final   Culture   Final    NO GROWTH < 12 HOURS Performed at [facility], [address], [place], [place] [address]    Report Status PENDING  Incomplete  Blood culture (routine x 2)     Status: None (Preliminary result)   Collection Time: 07/20/22  5:53 PM   Specimen: Site Not Specified; Blood  Result Value Ref Range Status   Specimen Description   Final    SITE NOT [place] AND ANAEROBIC   Special Requests Blood Culture adequate volume  Final   Culture   Final    NO GROWTH < 12 HOURS Performed at [facility], [address] [place], [place] [address]    Report Status PENDING  Incomplete  Gastrointestinal Panel by PCR , Stool     Status: Abnormal   Collection Time: 07/21/22  3:41 AM   Specimen: Stool  Result Value Ref Range  Status   Campylobacter species NOT DETECTED NOT DETECTED Final  Plesimonas shigelloides NOT DETECTED NOT DETECTED Final   Salmonella species NOT DETECTED NOT DETECTED Final   Yersinia enterocolitica NOT DETECTED NOT DETECTED Final   Vibrio species NOT DETECTED NOT DETECTED Final   Vibrio cholerae NOT DETECTED NOT DETECTED Final   Enteroaggregative E coli (EAEC) NOT DETECTED NOT DETECTED Final   Enteropathogenic E coli (EPEC) NOT DETECTED NOT DETECTED Final   Enterotoxigenic E coli (ETEC) NOT DETECTED NOT DETECTED Final   Shiga like toxin producing E coli (STEC) NOT DETECTED NOT DETECTED Final   Shigella/Enteroinvasive E coli (EIEC) NOT DETECTED NOT DETECTED Final   Cryptosporidium NOT DETECTED NOT DETECTED Final   Cyclospora cayetanensis NOT DETECTED NOT DETECTED Final   Entamoeba histolytica NOT DETECTED NOT DETECTED Final   Giardia lamblia NOT DETECTED NOT DETECTED Final   Adenovirus F40/41 NOT DETECTED NOT DETECTED Final   Astrovirus NOT DETECTED NOT DETECTED Final   Norovirus GI/GII NOT DETECTED NOT DETECTED Final   Rotavirus A DETECTED (A) NOT DETECTED Final   Sapovirus (I, II, IV, and V) NOT DETECTED NOT DETECTED Final    Comment: Performed at [facility], [address] [place], [place] [address]    Radiology Studies: CT Abdomen Pelvis W Contrast  Result Date: 07/20/2022 CLINICAL DATA:  Sepsis.  Emesis.  Fever. EXAM: CT ABDOMEN AND PELVIS WITH CONTRAST TECHNIQUE: Multidetector CT imaging of the abdomen and pelvis was performed using the standard protocol following bolus administration of intravenous contrast. RADIATION DOSE REDUCTION: This exam was performed according to the departmental dose-optimization program which includes automated exposure control, adjustment of the mA and/or kV according to patient size and/or use of iterative reconstruction technique. CONTRAST:  156m OMNIPAQUE IOHEXOL 300 MG/ML  SOLN COMPARISON:  Ultrasound 06/04/2019 and CT report from  11/17/2007 FINDINGS: Lower chest: Elevated right hemidiaphragm. Aortic and left anterior descending coronary artery atherosclerotic vascular calcification. Mild atelectasis along the right hemidiaphragm. Suspected atelectasis in the posterior basal segment right lower lobe. Hepatobiliary: Unremarkable Pancreas: Unremarkable Spleen: Unremarkable Adrenals/Urinary Tract: Trace gas in the urinary bladder, query recent catheterization. The kidneys and adrenal glands appear normal. Stomach/Bowel: Transverse duodenal diverticulum without findings of inflammation. Normal appendix. Fluid density in the distal large bowel and rectum compatible with diarrheal process. There are a few scattered diverticula of the descending and proximal sigmoid colon. No pneumatosis or discrete bowel wall thickening. Vascular/Lymphatic: Atherosclerosis is present, including aortoiliac atherosclerotic disease. There is atheromatous plaque at the origins of the celiac artery and SMA with narrowing of the celiac artery noted, some of this may be related to the median arcuate ligament. Today's exam was not a CT angiogram. Both vessels appear grossly patent with luminal contrast. Reproductive: Uterus absent.  Adnexa unremarkable. Other: No supplemental non-categorized findings. Musculoskeletal: Posterolateral rod and pedicle screw fixation at L4-L5-S1 with posterior decompression at these levels, as well as interbody spacers. A pulse generator is observed with wiring along the right posterior elements and right posterior decompression region. Remote L1 superior endplate compression fracture. Suspected left foraminal impingement at L2-3 due to left foraminal disc protrusion. IMPRESSION: 1. Fluid density in the distal large bowel and rectum compatible with diarrheal process. No pneumatosis or discrete bowel wall thickening. 2. Elevated right hemidiaphragm with mild atelectasis in the right lower lobe. 3. Trace gas in the urinary bladder, query recent  catheterization. 4. Suspected left foraminal impingement at L2-3 due to left foraminal disc protrusion. 5. Suspected narrowing of the celiac artery due to atheromatous plaque, some of this may be related to the median  arcuate ligament. 6. Aortic atherosclerosis. Aortic Atherosclerosis (ICD10-I70.0). Electronically Signed   By: Van Clines M.D.   On: 07/20/2022 17:06   DG Chest 2 View  Result Date: 07/20/2022 CLINICAL DATA:  Nausea, vomiting, diarrhea and fever for the past 2 days. EXAM: CHEST - 2 VIEW COMPARISON:  03/15/2021 FINDINGS: Normal sized heart. Partially calcified thoracic aorta. Stable elevated right hemidiaphragm. Stable mild peribronchial thickening. Clear lungs with normal vascularity. Unremarkable bones. IMPRESSION: No acute abnormality. Electronically Signed   By: Claudie Revering M.D.   On: 07/20/2022 14:02    Scheduled Meds:  ondansetron (ZOFRAN) IV  4 mg Intravenous Q6H   potassium chloride  40 mEq Oral Q3H   sertraline  100 mg Oral Daily   Continuous Infusions:  sodium chloride Stopped (07/21/22 0919)   ceFEPime (MAXIPIME) IV Stopped (07/21/22 1351)   vancomycin Stopped (07/21/22 0857)    LOS: 1 day   Roxan Hockey M.D on 07/21/2022 at 6:16 PM  Go to [email or web address] - for contact info  [facility] - Office  [phone]  If 7PM-7AM, please contact night-coverage [email or web address] 07/21/2022, 6:16 PM

## 2022-07-21 NOTE — ED Notes (Signed)
Larkin Ina, RN from ICU called to report Dr Josephine Cables has changed pt bed to tele instead of stepdown. Merry Proud, RN charge nurse made aware.

## 2022-07-21 NOTE — Progress Notes (Signed)
Pt pending stool culture and GI panel.  Large volume loose stool this shift, >900 ml in hat, green in color.  Afebrile at this time.  No vomiting, given scheduled zofran for nausea.  Ambulatory in room with SBA.  ST on monitor.  Denies pain.  Spouse at bedside.  Enteric precautions ongoing.

## 2022-07-22 DIAGNOSIS — R6520 Severe sepsis without septic shock: Secondary | ICD-10-CM | POA: Diagnosis not present

## 2022-07-22 DIAGNOSIS — A419 Sepsis, unspecified organism: Secondary | ICD-10-CM | POA: Diagnosis not present

## 2022-07-22 LAB — RENAL FUNCTION PANEL
Albumin: 2.6 g/dL — ABNORMAL LOW (ref 3.5–5.0)
Anion gap: 7 (ref 5–15)
BUN: 13 mg/dL (ref 8–23)
CO2: 20 mmol/L — ABNORMAL LOW (ref 22–32)
Calcium: 7.8 mg/dL — ABNORMAL LOW (ref 8.9–10.3)
Chloride: 112 mmol/L — ABNORMAL HIGH (ref 98–111)
Creatinine, Ser: 0.59 mg/dL (ref 0.44–1.00)
GFR, Estimated: >60 mL/min (ref >60)
Glucose, Bld: 79 mg/dL (ref 70–99)
Phosphorus: 1.9 mg/dL — ABNORMAL LOW (ref 2.5–4.6)
Potassium: 2.5 mmol/L — CL (ref 3.5–5.1)
Sodium: 139 mmol/L (ref 135–145)

## 2022-07-22 LAB — MAGNESIUM: Magnesium: 2.1 mg/dL (ref 1.7–2.4)

## 2022-07-22 MED ORDER — DIPHENOXYLATE-ATROPINE 2.5-0.025 MG PO TABS
2.0000 | ORAL_TABLET | Freq: Three times a day (TID) | ORAL | Status: DC
  Administered 2022-07-22 – 2022-07-23 (×4): 2 via ORAL
  Filled 2022-07-22 (×4): qty 2

## 2022-07-22 MED ORDER — DIPHENHYDRAMINE HCL 50 MG/ML IJ SOLN
INTRAMUSCULAR | Status: AC
  Administered 2022-07-22: 50 mg
  Filled 2022-07-22: qty 1

## 2022-07-22 MED ORDER — DIPHENOXYLATE-ATROPINE 2.5-0.025 MG/5ML PO LIQD: 10.0000 mL | Freq: Three times a day (TID) | ORAL | Status: DC

## 2022-07-22 MED ORDER — POTASSIUM CHLORIDE CRYS ER 20 MEQ PO TBCR
40.0000 meq | EXTENDED_RELEASE_TABLET | ORAL | Status: AC
  Administered 2022-07-22 (×2): 40 meq via ORAL
  Filled 2022-07-22 (×2): qty 2

## 2022-07-22 MED ORDER — POTASSIUM CHLORIDE 10 MEQ/100ML IV SOLN: 10.0000 meq | INTRAVENOUS | Status: DC

## 2022-07-22 MED ORDER — PANTOPRAZOLE SODIUM 40 MG PO TBEC
40.0000 mg | DELAYED_RELEASE_TABLET | Freq: Every day | ORAL | Status: DC
  Administered 2022-07-22 – 2022-07-23 (×2): 40 mg via ORAL
  Filled 2022-07-22 (×2): qty 1

## 2022-07-22 MED ORDER — DIPHENHYDRAMINE HCL 50 MG/ML IJ SOLN: 25.0000 mg | Freq: Three times a day (TID) | INTRAMUSCULAR | Status: DC | PRN

## 2022-07-22 MED ORDER — POTASSIUM PHOSPHATES 15 MMOLE/5ML IV SOLN
30.0000 mmol | Freq: Once | INTRAVENOUS | Status: AC
  Administered 2022-07-22: 30 mmol via INTRAVENOUS
  Filled 2022-07-22: qty 10

## 2022-07-22 NOTE — Progress Notes (Signed)
Pt was able to successfully tolerate changing from full liquid to soft diet. No N/V reported this shift, pt describes some RA pain that is relieved with PRN Oxycodone. Pt has been ambulatory to the bathroom and [facility] for bowel movements and describes improvement with new lomotil order. K+ was 2.5 with morning lab draw, potassium phosphate ordered and oral K+ administered this shift.

## 2022-07-22 NOTE — Progress Notes (Signed)
PROGRESS NOTE     Kaitlin Fleming, is a 62 y.o. female, DOB - 23-Mar-1961, [id number]  Admit date - 07/20/2022   Admitting Physician Truett Mainland, DO  Outpatient Primary MD for the patient is Kaitlin Lynn, NP (Inactive)  LOS - 2  Chief Complaint  Patient presents with   Emesis        Brief Narrative:  62 y.o. female with medical history significant of rheumatoid arthritis, GERD, depression, hyperlipidemia admitted on 07/20/2022 with rotavirus gastroenteritis with electrolyte abnormalities    -Assessment and Plan: 1)Hypokalemia/Hypomagnesemia/hypophosphatemia with Metabolic Acidosis----due to diarrhea in the setting of rotavirus infection Potassium 2.5 >> 2.3>>2.5  Mag- 1.6>>2.1 -Hypokalemia persistent despite replacement as patient is additional potassium replacements as well as phosphorous replacement -Recheck electrolytes after further replacements  2)Rotavirus Gastroenteritis--- GI pathogen/panel/culture noted with rotavirus -Patient's mother is currently admitted at [facility] with similar symptoms -Supportive care and IV fluids and electrolyte replacement as above 1 -Loperamide as needed -Zofran as needed -Ruled out for sepsis--- Stop vancomycin Flagyl and cefepime Met SIRS criteria on admission with a Tmax of 102.9, respiratory rate of 27 and heart rate of 131 on admission 07/22/22 -Frequent large volume stools persisted despite Imodium -Trial of  Lomotil   3) rheumatoid arthritis--- hold immunosuppressants at this time  4)Depression--- continue Zoloft  5)GERD--- continue Pepcid and Tums   Status is: Inpatient   Disposition: The patient is from: Home              Anticipated d/c is to: Home              Anticipated d/c date is: 1 day              Patient currently is not medically stable to d/c. Barriers: Not Clinically Stable-   Code Status :  -  Code Status: Full Code   Family Communication:    NA (patient is alert, awake and  coherent)   DVT Prophylaxis  :   - SCDs   Place and maintain sequential compression device Start: 07/21/22 0959   Lab Results  Component Value Date   PLT 150 07/21/2022    Inpatient Medications  Scheduled Meds:  calcium carbonate  400 mg of elemental calcium Oral BID   diphenoxylate-atropine  2 tablet Oral TID   loratadine  10 mg Oral QHS   ondansetron (ZOFRAN) IV  4 mg Intravenous Q6H   pantoprazole  40 mg Oral Daily   sertraline  100 mg Oral Daily   Continuous Infusions:  sodium chloride 125 mL/hr at 07/22/22 0405   PRN Meds:.acetaminophen **OR** acetaminophen, diphenhydrAMINE, loperamide, oxyCODONE   Anti-infectives (From admission, onward)    Start     Dose/Rate Route Frequency Ordered Stop   07/21/22 0800  vancomycin (VANCOCIN) IVPB 1000 mg/200 mL premix  Status:  Discontinued        1,000 mg 200 mL/hr over 60 Minutes Intravenous Every 12 hours 07/20/22 2037 07/22/22 0809   07/21/22 0600  ceFEPIme (MAXIPIME) 2 g in sodium chloride 0.9 % 100 mL IVPB  Status:  Discontinued        2 g 200 mL/hr over 30 Minutes Intravenous Every 8 hours 07/20/22 2037 07/21/22 1824   07/20/22 2045  vancomycin (VANCOREADY) IVPB 1500 mg/300 mL        1,500 mg 150 mL/hr over 120 Minutes Intravenous  Once 07/20/22 2032 07/20/22 2331   07/20/22 2030  ceFEPIme (MAXIPIME) 2 g in sodium chloride 0.9 % 100 mL IVPB  2 g 200 mL/hr over 30 Minutes Intravenous  Once 07/20/22 2016 07/20/22 2113   07/20/22 2030  metroNIDAZOLE (FLAGYL) IVPB 500 mg        500 mg 100 mL/hr over 60 Minutes Intravenous  Once 07/20/22 2016 07/20/22 2143   07/20/22 2030  vancomycin (VANCOCIN) IVPB 1000 mg/200 mL premix  Status:  Discontinued        1,000 mg 200 mL/hr over 60 Minutes Intravenous  Once 07/20/22 2016 07/20/22 2031         Subjective: Kaitlin Fleming today has no fevers,   No chest pain,   - -Husband at bedside, questions answered Frequent large volume stools persisted despite Imodium -Nausea  persist but no emesis   Objective: Vitals:   07/21/22 1414 07/21/22 2236 07/22/22 0524 07/22/22 1023  BP: 100/84 (!) 115/56 (!) 129/59 (!) 143/61  Pulse: 90 81 75 79  Resp:  '20 20 20  '$ Temp: 98.9 F (37.2 C) 98.1 F (36.7 C) 98 F (36.7 C) 98 F (36.7 C)  TempSrc: Oral Oral Oral Oral  SpO2: 100% 97% 98% 100%  Weight:      Height:        Intake/Output Summary (Last 24 hours) at 07/22/2022 1819 Last data filed at 07/22/2022 1500 Gross per 24 hour  Intake 1985.92 ml  Output --  Net 1985.92 ml   Filed Weights   07/21/22 0050  Weight: 81.1 kg    Physical Exam  Gen:- Awake Alert,  in no apparent distress  HEENT:- [place].AT, No sclera icterus Neck-Supple Neck,No JVD,.  Lungs-  CTAB , fair symmetrical air movement CV- S1, S2 normal, regular  Abd-  +ve B.Sounds, Abd Soft, No tenderness,    Extremity/Skin:- No  edema, pedal pulses present  Psych-affect is appropriate, oriented x3 Neuro-no new focal deficits, no tremors  Data Reviewed: I have personally reviewed following labs and imaging studies  CBC: Recent Labs  Lab 07/20/22 1412 07/21/22 0534  WBC 7.6 5.4  NEUTROABS 6.7  --   HGB 11.8* 10.5*  HCT 37.2 32.5*  MCV 93.5 92.1  PLT 189 [id number]   Basic Metabolic Panel: Recent Labs  Lab 07/20/22 1412 07/20/22 1749 07/21/22 0534 07/22/22 0515  NA 135  --  138 139  K 2.5*  --  2.3* 2.5*  CL 101  --  109 112*  CO2 25  --  20* 20*  GLUCOSE 111*  --  102* 79  BUN 8  --  12 13  CREATININE 0.64  --  0.74 0.59  CALCIUM 8.2*  --  7.6* 7.8*  MG  --  1.6*  --  2.1  PHOS  --   --   --  1.9*   GFR: Estimated Creatinine Clearance: 79.9 mL/min (by C-G formula based on SCr of 0.59 mg/dL). Liver Function Tests: Recent Labs  Lab 07/20/22 1412 07/22/22 0515  AST 18  --   ALT 14  --   ALKPHOS 74  --   BILITOT 0.5  --   PROT 7.5  --   ALBUMIN 3.6 2.6*   Recent Results (from the past 240 hour(s))  Resp panel by RT-PCR (RSV, Flu A&B, Covid) Anterior Nasal Swab     Status:  None   Collection Time: 07/20/22  4:35 PM   Specimen: Anterior Nasal Swab  Result Value Ref Range Status   SARS Coronavirus 2 by RT PCR NEGATIVE NEGATIVE Final    Comment: (NOTE) SARS-CoV-2 target nucleic acids are NOT DETECTED.  The SARS-CoV-2 RNA  is generally detectable in upper respiratory specimens during the acute phase of infection. The lowest concentration of SARS-CoV-2 viral copies this assay can detect is 138 copies/mL. A negative result does not preclude SARS-Cov-2 infection and should not be used as the sole basis for treatment or other patient management decisions. A negative result may occur with  improper specimen collection/handling, submission of specimen other than nasopharyngeal swab, presence of viral mutation(s) within the areas targeted by this assay, and inadequate number of viral copies(<138 copies/mL). A negative result must be combined with clinical observations, patient history, and epidemiological information. The expected result is Negative.  Fact Sheet for Patients:  EntrepreneurPulse.com.au  Fact Sheet for Healthcare Providers:  IncredibleEmployment.be  This test is no t yet approved or cleared by the [place] FDA and  has been authorized for detection and/or diagnosis of SARS-CoV-2 by FDA under an Emergency Use Authorization (EUA). This EUA will remain  in effect (meaning this test can be used) for the duration of the COVID-19 declaration under Section 564(b)(1) of the Act, 21 U.S.C.section 360bbb-3(b)(1), unless the authorization is terminated  or revoked sooner.       Influenza A by PCR NEGATIVE NEGATIVE Final   Influenza B by PCR NEGATIVE NEGATIVE Final    Comment: (NOTE) The Xpert Xpress SARS-CoV-2/FLU/RSV plus assay is intended as an aid in the diagnosis of influenza from Nasopharyngeal swab specimens and should not be used as a sole basis for treatment. Nasal washings and aspirates are unacceptable  for Xpert Xpress SARS-CoV-2/FLU/RSV testing.  Fact Sheet for Patients: EntrepreneurPulse.com.au  Fact Sheet for Healthcare Providers: IncredibleEmployment.be  This test is not yet approved or cleared by the [place] FDA and has been authorized for detection and/or diagnosis of SARS-CoV-2 by FDA under an Emergency Use Authorization (EUA). This EUA will remain in effect (meaning this test can be used) for the duration of the COVID-19 declaration under Section 564(b)(1) of the Act, 21 U.S.C. section 360bbb-3(b)(1), unless the authorization is terminated or revoked.     Resp Syncytial Virus by PCR NEGATIVE NEGATIVE Final    Comment: (NOTE) Fact Sheet for Patients: EntrepreneurPulse.com.au  Fact Sheet for Healthcare Providers: IncredibleEmployment.be  This test is not yet approved or cleared by the [place] FDA and has been authorized for detection and/or diagnosis of SARS-CoV-2 by FDA under an Emergency Use Authorization (EUA). This EUA will remain in effect (meaning this test can be used) for the duration of the COVID-19 declaration under Section 564(b)(1) of the Act, 21 U.S.C. section 360bbb-3(b)(1), unless the authorization is terminated or revoked.  Performed at [facility], [address] [place], [place] [address]   Blood culture (routine x 2)     Status: None (Preliminary result)   Collection Time: 07/20/22  5:53 PM   Specimen: Site Not Specified; Blood  Result Value Ref Range Status   Specimen Description   Final    SITE NOT SPECIFIED BOTTLES DRAWN AEROBIC AND ANAEROBIC   Special Requests Blood Culture adequate volume  Final   Culture   Final    NO GROWTH 2 DAYS Performed at [facility], [address] [place], [place] [address]    Report Status PENDING  Incomplete  Blood culture (routine x 2)     Status: None (Preliminary result)   Collection Time: 07/20/22  5:53 PM   Specimen:  Site Not Specified; Blood  Result Value Ref Range Status   Specimen Description   Final    SITE NOT SPECIFIED BOTTLES DRAWN AEROBIC  AND ANAEROBIC   Special Requests Blood Culture adequate volume  Final   Culture   Final    NO GROWTH 2 DAYS Performed at [facility], [address] [place], [place] [address]    Report Status PENDING  Incomplete  Gastrointestinal Panel by PCR , Stool     Status: Abnormal   Collection Time: 07/21/22  3:41 AM   Specimen: Stool  Result Value Ref Range Status   Campylobacter species NOT DETECTED NOT DETECTED Final   Plesimonas shigelloides NOT DETECTED NOT DETECTED Final   Salmonella species NOT DETECTED NOT DETECTED Final   Yersinia enterocolitica NOT DETECTED NOT DETECTED Final   Vibrio species NOT DETECTED NOT DETECTED Final   Vibrio cholerae NOT DETECTED NOT DETECTED Final   Enteroaggregative E coli (EAEC) NOT DETECTED NOT DETECTED Final   Enteropathogenic E coli (EPEC) NOT DETECTED NOT DETECTED Final   Enterotoxigenic E coli (ETEC) NOT DETECTED NOT DETECTED Final   Shiga like toxin producing E coli (STEC) NOT DETECTED NOT DETECTED Final   Shigella/Enteroinvasive E coli (EIEC) NOT DETECTED NOT DETECTED Final   Cryptosporidium NOT DETECTED NOT DETECTED Final   Cyclospora cayetanensis NOT DETECTED NOT DETECTED Final   Entamoeba histolytica NOT DETECTED NOT DETECTED Final   Giardia lamblia NOT DETECTED NOT DETECTED Final   Adenovirus F40/41 NOT DETECTED NOT DETECTED Final   Astrovirus NOT DETECTED NOT DETECTED Final   Norovirus GI/GII NOT DETECTED NOT DETECTED Final   Rotavirus A DETECTED (A) NOT DETECTED Final   Sapovirus (I, II, IV, and V) NOT DETECTED NOT DETECTED Final    Comment: Performed at [facility], [address], [place], [place] [address]    Radiology Studies: No results found.  Scheduled Meds:  calcium carbonate  400 mg of elemental calcium Oral BID   diphenoxylate-atropine  2 tablet Oral TID   loratadine  10 mg Oral QHS    ondansetron (ZOFRAN) IV  4 mg Intravenous Q6H   pantoprazole  40 mg Oral Daily   sertraline  100 mg Oral Daily   Continuous Infusions:  sodium chloride 125 mL/hr at 07/22/22 0405    LOS: 2 days   Roxan Hockey M.D on 07/22/2022 at 6:19 PM  Go to [email or web address] - for contact info  [facility] - Office  [phone]  If 7PM-7AM, please contact night-coverage [email or web address] 07/22/2022, 6:19 PM

## 2022-07-22 NOTE — Progress Notes (Signed)
Pt nausea managed to her satisfaction at this time, pt does not want to continue using zofran as she reports this is making her symptoms worse.  Pt will report if nausea worsens and needs different intervention.  Pt continues to have loose stools, understands needs to maintain oral hydration.  Pt does not want to use any further imodium.  Pt requesting to continue on her oral protonix that she uses twice daily at home, noted this med to be reflected in patients Care Everywhere chart.  Requested med rec by pharmacy and 1x order received for this shift.  Pt given oxycodone 1x for generalized pain r/t her RA.  Spouse at bedside.  NSR on monitor.

## 2022-07-23 DIAGNOSIS — A419 Sepsis, unspecified organism: Secondary | ICD-10-CM | POA: Diagnosis not present

## 2022-07-23 DIAGNOSIS — R6520 Severe sepsis without septic shock: Secondary | ICD-10-CM | POA: Diagnosis not present

## 2022-07-23 LAB — LIPID PANEL
Cholesterol: 108 mg/dL (ref 0–200)
HDL: 22 mg/dL — ABNORMAL LOW (ref >40)
LDL Cholesterol: 57 mg/dL (ref 0–99)
Total CHOL/HDL Ratio: 4.9 RATIO
Triglycerides: 144 mg/dL (ref <150)
VLDL: 29 mg/dL (ref 0–40)

## 2022-07-23 LAB — RENAL FUNCTION PANEL
Albumin: 2.6 g/dL — ABNORMAL LOW (ref 3.5–5.0)
Anion gap: 5 (ref 5–15)
BUN: 5 mg/dL — ABNORMAL LOW (ref 8–23)
CO2: 20 mmol/L — ABNORMAL LOW (ref 22–32)
Calcium: 7.6 mg/dL — ABNORMAL LOW (ref 8.9–10.3)
Chloride: 115 mmol/L — ABNORMAL HIGH (ref 98–111)
Creatinine, Ser: 0.57 mg/dL (ref 0.44–1.00)
GFR, Estimated: >60 mL/min (ref >60)
Glucose, Bld: 83 mg/dL (ref 70–99)
Phosphorus: 2.1 mg/dL — ABNORMAL LOW (ref 2.5–4.6)
Potassium: 3.2 mmol/L — ABNORMAL LOW (ref 3.5–5.1)
Sodium: 140 mmol/L (ref 135–145)

## 2022-07-23 MED ORDER — POTASSIUM CHLORIDE CRYS ER 20 MEQ PO TBCR
40.0000 meq | EXTENDED_RELEASE_TABLET | ORAL | Status: DC
  Administered 2022-07-23: 40 meq via ORAL
  Filled 2022-07-23: qty 2

## 2022-07-23 MED ORDER — POTASSIUM CHLORIDE CRYS ER 20 MEQ PO TBCR: 40.0000 meq | EXTENDED_RELEASE_TABLET | ORAL | Status: DC

## 2022-07-23 MED ORDER — ASPIRIN 81 MG PO TBEC: 81.0000 mg | DELAYED_RELEASE_TABLET | Freq: Every day | ORAL | 1 refills | Status: AC

## 2022-07-23 NOTE — Discharge Summary (Signed)
Physician Discharge Summary  Kahliah Axelrod [id number] DOB: 02/06/1961 DOA: 07/20/2022  PCP: Ivy Lynn, NP (Inactive)  Admit date: 07/20/2022 Discharge date: 07/23/2022  Time spent: 40 minutes  Recommendations for Outpatient Follow-up:  Follow outpatient CBC/CMP  Follow with PCP outpatient Follow with neurosurgery for L foraminal impingement at L2-3 Consider vascular surgery follow up for celiac artery narrowing She says she has lipitor at pharmacy to pick up, would follow up regarding this Follow repeat lipid panel   Discharge Diagnoses:  Principal Problem:   Severe sepsis ([place]) Active Problems:   Rheumatoid arthritis ([place])   GERD (gastroesophageal reflux disease)   Gastroenteritis   Discharge Condition: stable  Diet recommendation: heart healthy  Filed Weights   07/21/22 0050  Weight: 81.1 kg    History of present illness:   62 y.o. female with medical history significant of rheumatoid arthritis, GERD, depression, hyperlipidemia admitted on 07/20/2022 with rotavirus gastroenteritis with electrolyte abnormalities.  Improved on 3/11 and stable for discharge.  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 Cardiovascular: RRR Lungs: unlabored Abdomen: Soft, nontender, nondistended  Neurological: Alert and oriented 3. Moves all extremities 4 with equal strength. Cranial nerves II through XII grossly intact. Extremities: No clubbing or cyanosis. No edema.   Discharge Instructions   Discharge Instructions     Call MD for:  difficulty breathing, headache or visual disturbances   Complete by: As directed    Call MD for:  extreme fatigue   Complete by: As directed    Call MD for:  hives   Complete by: As directed    Call MD for:  persistant dizziness or light-headedness   Complete by: As directed    Call MD for:  persistant nausea and vomiting   Complete by: As directed    Call MD for:  redness, tenderness, or signs of infection (pain, swelling, redness, odor or green/yellow discharge around incision site)   Complete by: As directed    Call MD for:  severe uncontrolled pain   Complete by: As directed    Call MD for:  temperature >100.4   Complete by: As directed    Diet - low sodium heart healthy   Complete by: As directed    Diet - low sodium heart healthy   Complete by: As directed    Discharge instructions   Complete by: As directed    You were seen due to complications of Chantry Headen diarrheal illness (rotavirus).  Overall, you've improved at this time and are safe to discharge home.  Stay hydrated and eat as tolerated (take it easy as you advance your diet as you go home - eat things that are  easy to tolerate).  You can advance your diet as you tolerate over the next few days.  Your potassium was low, but has improved.  We gave you potassium supplements prior to discharge.  Follow with your PCP for repeat labs within about 1 week or so to ensure it remains stable.  You had some abnormal imaging findings that can be followed outpatient.  There was concern for foraminal impingement in your lumbar spine.  There  was also narrowing of the celiac artery which can be followed with your outpatient doctor.  Follow up with your neurosurgeon for the abnormal back imaging.   For the narrowing of the celiac artery, start Manilla Strieter baby aspirin daily.  You told me you have atorvastatin waiting for you at the pharmacy, go ahead and pick that up.  Talk to your PCP about vascular surgery follow up.  Return for new, recurrent, or worsening symptoms.  Please ask your PCP to request records from this hospitalization so they know what was done and what the next steps will be.   Increase activity slowly   Complete by: As directed    Increase activity slowly   Complete by: As directed       Allergies as of 07/23/2022       Reactions   Alpha-gal Anaphylaxis, Swelling, Hives, Other (See Comments)   Serology-proven Serology-proven   Macrobid [nitrofurantoin] Shortness Of Breath, Rash   Pravastatin Other (See Comments), Nausea And Vomiting   Rash & heart racing. Rash & heart racing. Rash & heart racing.   Macrolides And Ketolides    Methotrexate    Remicade [infliximab]    Tofacitinib Other (See Comments)   Upper respiratory infections   Ibuprofen Hives   Penicillins Other (See Comments)   Breaks out Has patient had Sun Kihn PCN reaction causing immediate rash, facial/tongue/throat swelling, SOB or lightheadedness with hypotension: yes Has patient had Davone Shinault PCN reaction causing severe rash involving mucus membranes or skin necrosis: no Has patient had Kienan Doublin PCN reaction that required hospitalization: no Has patient had Lunah Losasso PCN reaction occurring within the last 10 years:no If all of the above answers are "NO", then may proceed with Cephalosporin use.   Sulfa Antibiotics Other (See Comments)   Breaks out        Medication List     STOP taking these medications    hydrochlorothiazide 12.5 MG tablet Commonly known as: HYDRODIURIL       TAKE these medications    aspirin EC 81 MG tablet Take 1 tablet (81 mg total) by  mouth daily. Swallow whole.   azelastine 0.1 % nasal spray Commonly known as: ASTELIN Place 2 sprays into both nostrils 2 (two) times daily. Use in each nostril as directed   EPINEPHrine 0.3 mg/0.3 mL Soaj injection Commonly known as: EPI-PEN Inject 0.3 mg into the muscle as needed for anaphylaxis.   fluticasone 50 MCG/ACT nasal spray Commonly known as: FLONASE Place 2 sprays into both nostrils daily.   loratadine 10 MG tablet Commonly known as: CLARITIN Take 1 tablet (10 mg total) by mouth at bedtime.   oxyCODONE-acetaminophen 10-325 MG tablet Commonly known as: PERCOCET Take 1 tablet by mouth every 6 (six) hours as needed for pain.   predniSONE 5 MG tablet Commonly known as: DELTASONE Take 1 tablet (5 mg total) by mouth daily.   Rinvoq 15 MG Tb24 Generic drug: Upadacitinib ER Take 1 tablet by mouth daily.   sertraline 50 MG tablet Commonly known as: ZOLOFT Take 1 tablet (50  mg total) by mouth daily.   triamcinolone cream 0.1 % Commonly known as: KENALOG Apply topically.       Allergies  Allergen Reactions   Alpha-Gal Anaphylaxis, Swelling, Hives and Other (See Comments)    Serology-proven Serology-proven    Macrobid [Nitrofurantoin] Shortness Of Breath and Rash   Pravastatin Other (See Comments) and Nausea And Vomiting    Rash & heart racing. Rash & heart racing. Rash & heart racing.   Macrolides And Ketolides    Methotrexate    Remicade [Infliximab]    Tofacitinib Other (See Comments)    Upper respiratory infections   Ibuprofen Hives   Penicillins Other (See Comments)    Breaks out Has patient had Idali Lafever PCN reaction causing immediate rash, facial/tongue/throat swelling, SOB or lightheadedness with hypotension: yes Has patient had Mars Scheaffer PCN reaction causing severe rash involving mucus membranes or skin necrosis: no Has patient had Ibrahima Holberg PCN reaction that required hospitalization: no Has patient had Guadalupe Nickless PCN reaction occurring within the last 10 years:no If all of  the above answers are "NO", then may proceed with Cephalosporin use.   Sulfa Antibiotics Other (See Comments)    Breaks out      The results of significant diagnostics from this hospitalization (including imaging, microbiology, ancillary and laboratory) are listed below for reference.    Significant Diagnostic Studies: CT Abdomen Pelvis W Contrast  Result Date: 07/20/2022 CLINICAL DATA:  Sepsis.  Emesis.  Fever. EXAM: CT ABDOMEN AND PELVIS WITH CONTRAST TECHNIQUE: Multidetector CT imaging of the abdomen and pelvis was performed using the standard protocol following bolus administration of intravenous contrast. RADIATION DOSE REDUCTION: This exam was performed according to the departmental dose-optimization program which includes automated exposure control, adjustment of the mA and/or kV according to patient size and/or use of iterative reconstruction technique. CONTRAST:  18m OMNIPAQUE IOHEXOL 300 MG/ML  SOLN COMPARISON:  Ultrasound 06/04/2019 and CT report from 11/17/2007 FINDINGS: Lower chest: Elevated right hemidiaphragm. Aortic and left anterior descending coronary artery atherosclerotic vascular calcification. Mild atelectasis along the right hemidiaphragm. Suspected atelectasis in the posterior basal segment right lower lobe. Hepatobiliary: Unremarkable Pancreas: Unremarkable Spleen: Unremarkable Adrenals/Urinary Tract: Trace gas in the urinary bladder, query recent catheterization. The kidneys and adrenal glands appear normal. Stomach/Bowel: Transverse duodenal diverticulum without findings of inflammation. Normal appendix. Fluid density in the distal large bowel and rectum compatible with diarrheal process. There are Alecia Doi few scattered diverticula of the descending and proximal sigmoid colon. No pneumatosis or discrete bowel wall thickening. Vascular/Lymphatic: Atherosclerosis is present, including aortoiliac atherosclerotic disease. There is atheromatous plaque at the origins of the celiac artery  and SMA with narrowing of the celiac artery noted, some of this may be related to the median arcuate ligament. Today's exam was not Zahli Vetsch CT angiogram. Both vessels appear grossly patent with luminal contrast. Reproductive: Uterus absent.  Adnexa unremarkable. Other: No supplemental non-categorized findings. Musculoskeletal: Posterolateral rod and pedicle screw fixation at L4-L5-S1 with posterior decompression at these levels, as well as interbody spacers. Sten Dematteo pulse generator is observed with wiring along the right posterior elements and right posterior decompression region. Remote L1 superior endplate compression fracture. Suspected left foraminal impingement at L2-3 due to left foraminal disc protrusion. IMPRESSION: 1. Fluid density in the distal large bowel and rectum compatible with diarrheal process. No pneumatosis or discrete bowel wall thickening. 2. Elevated right hemidiaphragm with mild atelectasis in the right lower lobe. 3. Trace gas in the urinary bladder, query recent catheterization. 4. Suspected left foraminal impingement at L2-3 due  to left foraminal disc protrusion. 5. Suspected narrowing of the celiac artery due to atheromatous plaque, some of this may be related to the median arcuate ligament. 6. Aortic atherosclerosis. Aortic Atherosclerosis (ICD10-I70.0). Electronically Signed   By: Van Clines M.D.   On: 07/20/2022 17:06   DG Chest 2 View  Result Date: 07/20/2022 CLINICAL DATA:  Nausea, vomiting, diarrhea and fever for the past 2 days. EXAM: CHEST - 2 VIEW COMPARISON:  03/15/2021 FINDINGS: Normal sized heart. Partially calcified thoracic aorta. Stable elevated right hemidiaphragm. Stable mild peribronchial thickening. Clear lungs with normal vascularity. Unremarkable bones. IMPRESSION: No acute abnormality. Electronically Signed   By: Claudie Revering M.D.   On: 07/20/2022 14:02    Microbiology: Recent Results (from the past 240 hour(s))  Resp panel by RT-PCR (RSV, Flu Erez Mccallum&B, Covid) Anterior  Nasal Swab     Status: None   Collection Time: 07/20/22  4:35 PM   Specimen: Anterior Nasal Swab  Result Value Ref Range Status   SARS Coronavirus 2 by RT PCR NEGATIVE NEGATIVE Final    Comment: (NOTE) SARS-CoV-2 target nucleic acids are NOT DETECTED.  The SARS-CoV-2 RNA is generally detectable in upper respiratory specimens during the acute phase of infection. The lowest concentration of SARS-CoV-2 viral copies this assay can detect is 138 copies/mL. Titilayo Hagans negative result does not preclude SARS-Cov-2 infection and should not be used as the sole basis for treatment or other patient management decisions. Glada Wickstrom negative result may occur with  improper specimen collection/handling, submission of specimen other than nasopharyngeal swab, presence of viral mutation(s) within the areas targeted by this assay, and inadequate number of viral copies(<138 copies/mL). Kanoe Wanner negative result must be combined with clinical observations, patient history, and epidemiological information. The expected result is Negative.  Fact Sheet for Patients:  EntrepreneurPulse.com.au  Fact Sheet for Healthcare Providers:  IncredibleEmployment.be  This test is no t yet approved or cleared by the [place] FDA and  has been authorized for detection and/or diagnosis of SARS-CoV-2 by FDA under an Emergency Use Authorization (EUA). This EUA will remain  in effect (meaning this test can be used) for the duration of the COVID-19 declaration under Section 564(b)(1) of the Act, 21 U.S.C.section 360bbb-3(b)(1), unless the authorization is terminated  or revoked sooner.       Influenza Carlis Burnsworth by PCR NEGATIVE NEGATIVE Final   Influenza B by PCR NEGATIVE NEGATIVE Final    Comment: (NOTE) The Xpert Xpress SARS-CoV-2/FLU/RSV plus assay is intended as an aid in the diagnosis of influenza from Nasopharyngeal swab specimens and should not be used as Montanna Mcbain sole basis for treatment. Nasal washings  and aspirates are unacceptable for Xpert Xpress SARS-CoV-2/FLU/RSV testing.  Fact Sheet for Patients: EntrepreneurPulse.com.au  Fact Sheet for Healthcare Providers: IncredibleEmployment.be  This test is not yet approved or cleared by the [place] FDA and has been authorized for detection and/or diagnosis of SARS-CoV-2 by FDA under an Emergency Use Authorization (EUA). This EUA will remain in effect (meaning this test can be used) for the duration of the COVID-19 declaration under Section 564(b)(1) of the Act, 21 U.S.C. section 360bbb-3(b)(1), unless the authorization is terminated or revoked.     Resp Syncytial Virus by PCR NEGATIVE NEGATIVE Final    Comment: (NOTE) Fact Sheet for Patients: EntrepreneurPulse.com.au  Fact Sheet for Healthcare Providers: IncredibleEmployment.be  This test is not yet approved or cleared by the [place] FDA and has been authorized for detection and/or diagnosis of SARS-CoV-2 by FDA under an Emergency Use Authorization (EUA).  This EUA will remain in effect (meaning this test can be used) for the duration of the COVID-19 declaration under Section 564(b)(1) of the Act, 21 U.S.C. section 360bbb-3(b)(1), unless the authorization is terminated or revoked.  Performed at [facility], [address], [place], [place] [address]   Blood culture (routine x 2)     Status: None (Preliminary result)   Collection Time: 07/20/22  5:53 PM   Specimen: Site Not Specified; Blood  Result Value Ref Range Status   Specimen Description   Final    SITE NOT SPECIFIED BOTTLES DRAWN AEROBIC AND ANAEROBIC   Special Requests Blood Culture adequate volume  Final   Culture   Final    NO GROWTH 3 DAYS Performed at [facility], [address], [place], [place] [address]    Report Status PENDING  Incomplete  Blood culture (routine x 2)     Status: None (Preliminary result)   Collection Time:  07/20/22  5:53 PM   Specimen: Site Not Specified; Blood  Result Value Ref Range Status   Specimen Description   Final    SITE NOT SPECIFIED BOTTLES DRAWN AEROBIC AND ANAEROBIC   Special Requests Blood Culture adequate volume  Final   Culture   Final    NO GROWTH 3 DAYS Performed at [facility], [address], [place], [place] [address]    Report Status PENDING  Incomplete  Gastrointestinal Panel by PCR , Stool     Status: Abnormal   Collection Time: 07/21/22  3:41 AM   Specimen: Stool  Result Value Ref Range Status   Campylobacter species NOT DETECTED NOT DETECTED Final   Plesimonas shigelloides NOT DETECTED NOT DETECTED Final   Salmonella species NOT DETECTED NOT DETECTED Final   Yersinia enterocolitica NOT DETECTED NOT DETECTED Final   Vibrio species NOT DETECTED NOT DETECTED Final   Vibrio cholerae NOT DETECTED NOT DETECTED Final   Enteroaggregative E coli (EAEC) NOT DETECTED NOT DETECTED Final   Enteropathogenic E coli (EPEC) NOT DETECTED NOT DETECTED Final   Enterotoxigenic E coli (ETEC) NOT DETECTED NOT DETECTED Final   Shiga like toxin producing E coli (STEC) NOT DETECTED NOT DETECTED Final   Shigella/Enteroinvasive E coli (EIEC) NOT DETECTED NOT DETECTED Final   Cryptosporidium NOT DETECTED NOT DETECTED Final   Cyclospora cayetanensis NOT DETECTED NOT DETECTED Final   Entamoeba histolytica NOT DETECTED NOT DETECTED Final   Giardia lamblia NOT DETECTED NOT DETECTED Final   Adenovirus F40/41 NOT DETECTED NOT DETECTED Final   Astrovirus NOT DETECTED NOT DETECTED Final   Norovirus GI/GII NOT DETECTED NOT DETECTED Final   Rotavirus Mort Smelser DETECTED (Batina Dougan) NOT DETECTED Final   Sapovirus (I, II, IV, and V) NOT DETECTED NOT DETECTED Final    Comment: Performed at [facility], [address], [place], [place] [address]     Labs: Basic Metabolic Panel: Recent Labs  Lab 07/20/22 1412 07/20/22 1749 07/21/22 0534 07/22/22 0515 07/23/22 0439  NA 135  --  138 139 140  K  2.5*  --  2.3* 2.5* 3.2*  CL 101  --  109 112* 115*  CO2 25  --  20* 20* 20*  GLUCOSE 111*  --  102* 79 83  BUN 8  --  12 13 5*  CREATININE 0.64  --  0.74 0.59 0.57  CALCIUM 8.2*  --  7.6* 7.8* 7.6*  MG  --  1.6*  --  2.1  --   PHOS  --   --   --  1.9* 2.1*   Liver Function Tests: Recent Labs  Lab 07/20/22 1412 07/22/22 0515 07/23/22 0439  AST 18  --   --   ALT 14  --   --   ALKPHOS 74  --   --   BILITOT 0.5  --   --   PROT 7.5  --   --   ALBUMIN 3.6 2.6* 2.6*   Recent Labs  Lab 07/20/22 1412  LIPASE 22   No results for input(s): "AMMONIA" in the last 168 hours. CBC: Recent Labs  Lab 07/20/22 1412 07/21/22 0534  WBC 7.6 5.4  NEUTROABS 6.7  --   HGB 11.8* 10.5*  HCT 37.2 32.5*  MCV 93.5 92.1  PLT 189 150   Cardiac Enzymes: No results for input(s): "CKTOTAL", "CKMB", "CKMBINDEX", "TROPONINI" in the last 168 hours. BNP: BNP (last 3 results) No results for input(s): "BNP" in the last 8760 hours.  ProBNP (last 3 results) No results for input(s): "PROBNP" in the last 8760 hours.  CBG: No results for input(s): "GLUCAP" in the last 168 hours.     Signed:  Fayrene Helper MD.  [facility] 07/23/2022, 12:28 PM

## 2022-07-23 NOTE — Progress Notes (Signed)
Nsg Discharge Note  Admit Date:  07/20/2022 Discharge date: 07/23/2022   Kaitlin Fleming to be D/C'd Home per MD order.  AVS completed.  Copy for chart, and copy for patient signed, and dated. Patient/caregiver able to verbalize understanding.  Discharge Medication: Allergies as of 07/23/2022       Reactions   Alpha-gal Anaphylaxis, Swelling, Hives, Other (See Comments)   Serology-proven Serology-proven   Macrobid [nitrofurantoin] Shortness Of Breath, Rash   Pravastatin Other (See Comments), Nausea And Vomiting   Rash & heart racing. Rash & heart racing. Rash & heart racing.   Macrolides And Ketolides    Methotrexate    Remicade [infliximab]    Tofacitinib Other (See Comments)   Upper respiratory infections   Ibuprofen Hives   Penicillins Other (See Comments)   Breaks out Has patient had a PCN reaction causing immediate rash, facial/tongue/throat swelling, SOB or lightheadedness with hypotension: yes Has patient had a PCN reaction causing severe rash involving mucus membranes or skin necrosis: no Has patient had a PCN reaction that required hospitalization: no Has patient had a PCN reaction occurring within the last 10 years:no If all of the above answers are "NO", then may proceed with Cephalosporin use.   Sulfa Antibiotics Other (See Comments)   Breaks out        Medication List     STOP taking these medications    hydrochlorothiazide 12.5 MG tablet Commonly known as: HYDRODIURIL       TAKE these medications    aspirin EC 81 MG tablet Take 1 tablet (81 mg total) by mouth daily. Swallow whole.   azelastine 0.1 % nasal spray Commonly known as: ASTELIN Place 2 sprays into both nostrils 2 (two) times daily. Use in each nostril as directed   EPINEPHrine 0.3 mg/0.3 mL Soaj injection Commonly known as: EPI-PEN Inject 0.3 mg into the muscle as needed for anaphylaxis.   fluticasone 50 MCG/ACT nasal spray Commonly known as: FLONASE Place 2 sprays into both  nostrils daily.   loratadine 10 MG tablet Commonly known as: CLARITIN Take 1 tablet (10 mg total) by mouth at bedtime.   oxyCODONE-acetaminophen 10-325 MG tablet Commonly known as: PERCOCET Take 1 tablet by mouth every 6 (six) hours as needed for pain.   predniSONE 5 MG tablet Commonly known as: DELTASONE Take 1 tablet (5 mg total) by mouth daily.   Rinvoq 15 MG Tb24 Generic drug: Upadacitinib ER Take 1 tablet by mouth daily.   sertraline 50 MG tablet Commonly known as: ZOLOFT Take 1 tablet (50 mg total) by mouth daily.   triamcinolone cream 0.1 % Commonly known as: KENALOG Apply topically.        Discharge Assessment: Vitals:   07/22/22 2003 07/23/22 0508  BP: (!) 147/64 (!) 153/71  Pulse: 76 72  Resp: 20 16  Temp: 98.6 F (37 C) 98 F (36.7 C)  SpO2: 98% 99%   Skin clean, dry and intact without evidence of skin break down, no evidence of skin tears noted. IV catheter discontinued intact. Site without signs and symptoms of complications - no redness or edema noted at insertion site, patient denies c/o pain - only slight tenderness at site.  Dressing with slight pressure applied.  D/c Instructions-Education: Discharge instructions given to patient/family with verbalized understanding. D/c education completed with patient/family including follow up instructions, medication list, d/c activities limitations if indicated, with other d/c instructions as indicated by MD - patient able to verbalize understanding, all questions fully answered. Patient instructed to return  to ED, call 911, or call MD for any changes in condition.  Patient escorted via [place], and D/C home via private auto.  Clovis Fredrickson, LPN [phone] PM

## 2022-07-25 LAB — CULTURE, BLOOD (ROUTINE X 2)
Culture: NO GROWTH
Culture: NO GROWTH
Special Requests: ADEQUATE
Special Requests: ADEQUATE

## 2022-07-26 ENCOUNTER — Encounter: Payer: Self-pay | Admitting: Family Medicine

## 2022-07-26 ENCOUNTER — Ambulatory Visit: Payer: Medicaid Other | Admitting: Family Medicine

## 2022-07-26 VITALS — BP 146/76 | HR 90 | Temp 99.0°F | Ht 65.0 in | Wt 176.0 lb

## 2022-07-26 DIAGNOSIS — I771 Stricture of artery: Secondary | ICD-10-CM | POA: Diagnosis not present

## 2022-07-26 DIAGNOSIS — R5383 Other fatigue: Secondary | ICD-10-CM | POA: Diagnosis not present

## 2022-07-26 DIAGNOSIS — R030 Elevated blood-pressure reading, without diagnosis of hypertension: Secondary | ICD-10-CM | POA: Diagnosis not present

## 2022-07-26 DIAGNOSIS — K2100 Gastro-esophageal reflux disease with esophagitis, without bleeding: Secondary | ICD-10-CM | POA: Diagnosis not present

## 2022-07-26 LAB — STOOL CULTURE REFLEX - CMPCXR

## 2022-07-26 LAB — STOOL CULTURE: E coli, Shiga toxin Assay: NEGATIVE

## 2022-07-26 LAB — STOOL CULTURE REFLEX - RSASHR

## 2022-07-26 MED ORDER — PRAVASTATIN SODIUM 10 MG PO TABS: 10.0000 mg | ORAL_TABLET | Freq: Every day | ORAL | 0 refills | Status: DC

## 2022-07-26 NOTE — Progress Notes (Addendum)
New Patient Office Visit  Subjective   Patient ID: Kaitlin Fleming, female    DOB: 08-24-1960  Age: 62 y.o. 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   Admitted to hospital for [place]. Had N/D q2 hours prior to going in. States that they found additional bulging disc in her back and celiac artery stenosis. They did not start her on statin. Per note believed she had it at the pharmacy to pick up. She was started on aspirin, but she is nervous about taking aspirin with her other medications.  Has ongoing fatigue. States that it took her an hour to get ready today. Is raising her granddaughter, Winn Jock.  Has been off of prednisone for several days and would like to try staying off of it for the time being while she is "figuring out her artery"  Outpatient Encounter Medications as of 07/26/2022  Medication Sig   aspirin EC 81 MG tablet Take 1 tablet (81 mg total) by mouth daily. Swallow whole.   azelastine (ASTELIN) 0.1 % nasal spray Place 2 sprays into both nostrils 2 (two) times daily. Use in each nostril as directed   EPINEPHrine 0.3 mg/0.3 mL IJ SOAJ injection Inject 0.3 mg into the muscle as needed for anaphylaxis.   fluticasone (FLONASE) 50 MCG/ACT nasal spray Place 2 sprays into both nostrils daily.   loratadine (CLARITIN) 10 MG tablet Take 1 tablet (10 mg total) by mouth at bedtime.   oxyCODONE-acetaminophen (PERCOCET) 10-325 MG tablet Take 1 tablet by mouth every 6 (six) hours as needed for pain.    pantoprazole (PROTONIX) 40 MG tablet Take 40 mg by mouth daily.   predniSONE (DELTASONE) 5 MG tablet Take 1 tablet (5 mg total) by mouth daily.   sertraline (ZOLOFT) 50 MG tablet Take 1 tablet (50 mg total) by mouth daily.   triamcinolone cream (KENALOG) 0.1 % Apply topically.   Upadacitinib ER (RINVOQ)  15 MG TB24 Take 1 tablet by mouth daily. (Patient not taking: Reported on 07/26/2022)   No facility-administered encounter medications on file as of 07/26/2022.    Past Medical History:  Diagnosis Date   Allergy to alpha-gal    Chronic back pain    stenosis   Chronic pain    takes Lexapro daily   Complication of anesthesia    pt states after surgery in 2000 had to wear a heart monitor for 3 days.   DDD (degenerative disc disease)    Depression    GERD (gastroesophageal reflux disease)    takes OTC meds if needed   History of blood transfusion    as a child   History of bronchitis 78yr ago   Hyperlipidemia    takes Fenofibrate daily   Joint pain    Joint swelling    Kidney stone    has one right now on the right side but not giving any problems   Osteoarthritis    Peripheral edema    takes HCTZ daily    Pneumonia 380yrago   hx of   PONV (postoperative nausea and vomiting)    Rheumatoid arthritis (HCC)    Rheumatoid arthritis (HCC)    Weakness    and tingling on left side    Past Surgical History:  Procedure Laterality Date   ABDOMINAL HYSTERECTOMY  05/14/1998   BACK SURGERY  03/10/2014   lumbar fusion   BASAL CELL CARCINOMA EXCISION  11/2021   LUMBAR LAMINECTOMY/DECOMPRESSION MICRODISCECTOMY Left 12/08/2013   Procedure: LEFT LUMBAR FOUR-FIVEW, LUMBAR FIVE-SACRAL ONE LAMINECTOMY;  Surgeon: Floyce Stakes, MD;  Location: [facility];  Service: Neurosurgery;  Laterality: Left;   TUBAL LIGATION  24 yrs ago    Family History  Problem Relation Age of Onset   Colon cancer Maternal Grandmother    Colon cancer Paternal Uncle     Social History   Socioeconomic History   Marital status: Married    Spouse name: Not on file   Number of children: Not on file   Years of education: Not on file   Highest education level: Not on file  Occupational History   Not on file  Tobacco Use   Smoking status: Never   Smokeless tobacco: Never  Vaping Use   Vaping Use: Never  used  Substance and Sexual Activity   Alcohol use: No   Drug use: No   Sexual activity: Yes    Birth control/protection: Surgical  Other Topics Concern   Not on file  Social History Narrative   Not on file   Social Determinants of Health   Financial Resource Strain: Not on file  Food Insecurity: No Food Insecurity (07/21/2022)   Hunger Vital Sign    Worried About Running Out of Food in the Last Year: Never true    Ran Out of Food in the Last Year: Never true  Transportation Needs: No Transportation Needs (07/21/2022)   PRAPARE - Hydrologist (Medical): No    Lack of Transportation (Non-Medical): No  Physical Activity: Not on file  Stress: Not on file  Social Connections: Not on file  Intimate Partner Violence: Not At Risk (07/21/2022)   Humiliation, Afraid, Rape, and Kick questionnaire    Fear of Current or Ex-Partner: No    Emotionally Abused: No    Physically Abused: No    Sexually Abused: No    ROS As per HPI   Objective   BP (!) 146/76   Pulse 90   Temp 99 F (37.2 C)   Ht '5\' 5"'$  (1.651 m)   Wt 176 lb (79.8 kg)   SpO2 98%   BMI 29.29 kg/m   Physical Exam Constitutional:      General: She is not in acute distress.    Appearance: Normal appearance. She is not ill-appearing, toxic-appearing or diaphoretic.  Cardiovascular:     Rate and Rhythm: Normal rate.     Pulses: Normal pulses.     Heart sounds: Normal heart sounds. No murmur heard.    No gallop.  Pulmonary:     Effort: Pulmonary effort is normal. No respiratory distress.     Breath sounds: Normal breath sounds. No stridor. No wheezing, rhonchi or rales.  Skin:    General: Skin is warm.     Capillary Refill: Capillary refill takes less than 2 seconds.  Neurological:     General: No focal deficit present.     Mental Status: She is alert and oriented to person, place, and time. Mental status is at baseline.     Motor: No weakness.  Psychiatric:        Mood and Affect: Mood  normal.        Behavior: Behavior normal.        Thought Content: Thought content normal.        Judgment: Judgment normal.    Assessment &  Plan:  1. Celiac artery stenosis [facility]) Urgent referral placed for Vascular as below. Labs as below. Pt discharged from hospital with Potassium level of 3.2 (not in normal range). Will plan for recheck of labs today and recheck next week as well to make sure stability. Discussed with patient to retry pravastatin for stenosis. Pt to take pravastatin 3 times weekly with COQ10 to reduce symptoms.  - Ambulatory referral to Vascular Surgery - CBC with Differential/Platelet; Future - CMP14+EGFR; Future - pravastatin (PRAVACHOL) 10 MG tablet; Take 1 tablet (10 mg total) by mouth daily.  Dispense: 90 tablet; Refill: 0  2. Fatigue, unspecified type Labs as below. Will communicate results to patient once available.  - CBC with Differential/Platelet; Future - CMP14+EGFR; Future  3. Elevated blood pressure reading Elevated BP today in office. Discussed with patient monitoring BP at home. Provided BP log to patient in clinic today. Instructed pt to take BP first thing in the morning after sitting for 5 minutes with feet flat on the floor, arm at heart level. Discussed with patient options for BP cuff at [facility], [employer], [place]. Pt verbalized they were able to obtain BP cuff. Will review measurements with patient at follow up and determine plan for BP management.   4. Gastroesophageal reflux disease with esophagitis without hemorrhage Well controlled on current regimen. Continue medication as below.  - pantoprazole (PROTONIX) 40 MG tablet; Take 40 mg by mouth daily.  The above assessment and management plan was discussed with the patient. The patient verbalized understanding of and has agreed to the management plan using shared-decision making. Patient is aware to call the clinic if they develop any new symptoms or if symptoms fail to improve or worsen.  Patient is aware when to return to the clinic for a follow-up visit. Patient educated on when it is appropriate to go to the emergency department.    Donzetta Kohut, DNP-FNP [facility] [address] [place],  [address] [phone]

## 2022-07-26 NOTE — Patient Instructions (Signed)
Take pravastatin 3 times a week with CoQ 10  Take aspirin daily

## 2022-07-27 LAB — CMP14+EGFR
ALT: 9 IU/L (ref 0–32)
AST: 13 IU/L (ref 0–40)
Albumin/Globulin Ratio: 1.3 (ref 1.2–2.2)
Albumin: 3.7 g/dL — ABNORMAL LOW (ref 3.9–4.9)
Alkaline Phosphatase: 86 IU/L (ref 44–121)
BUN/Creatinine Ratio: 5 — ABNORMAL LOW (ref 12–28)
BUN: 3 mg/dL — ABNORMAL LOW (ref 8–27)
Bilirubin Total: 0.2 mg/dL (ref 0.0–1.2)
CO2: 26 mmol/L (ref 20–29)
Calcium: 8.3 mg/dL — ABNORMAL LOW (ref 8.7–10.3)
Chloride: 105 mmol/L (ref 96–106)
Creatinine, Ser: 0.56 mg/dL — ABNORMAL LOW (ref 0.57–1.00)
Globulin, Total: 2.8 g/dL (ref 1.5–4.5)
Glucose: 106 mg/dL — ABNORMAL HIGH (ref 70–99)
Potassium: 3.4 mmol/L — ABNORMAL LOW (ref 3.5–5.2)
Sodium: 146 mmol/L — ABNORMAL HIGH (ref 134–144)
Total Protein: 6.5 g/dL (ref 6.0–8.5)
eGFR: 103 mL/min/{1.73_m2} (ref >59)

## 2022-07-27 LAB — CBC WITH DIFFERENTIAL/PLATELET
Basophils Absolute: 0 10*3/uL (ref 0.0–0.2)
Basos: 0 %
EOS (ABSOLUTE): 0.1 10*3/uL (ref 0.0–0.4)
Eos: 2 %
Hematocrit: 34.6 % (ref 34.0–46.6)
Hemoglobin: 11.7 g/dL (ref 11.1–15.9)
Immature Grans (Abs): 0 10*3/uL (ref 0.0–0.1)
Immature Granulocytes: 0 %
Lymphocytes Absolute: 0.9 10*3/uL (ref 0.7–3.1)
Lymphs: 18 %
MCH: 29.3 pg (ref 26.6–33.0)
MCHC: 33.8 g/dL (ref 31.5–35.7)
MCV: 87 fL (ref 79–97)
Monocytes Absolute: 0.4 10*3/uL (ref 0.1–0.9)
Monocytes: 9 %
Neutrophils Absolute: 3.5 10*3/uL (ref 1.4–7.0)
Neutrophils: 71 %
Platelets: 252 10*3/uL (ref 150–450)
RBC: 4 x10E6/uL (ref 3.77–5.28)
RDW: 12.7 % (ref 11.7–15.4)
WBC: 5 10*3/uL (ref 3.4–10.8)

## 2022-07-27 MED ORDER — POTASSIUM CHLORIDE CRYS ER 20 MEQ PO TBCR: 20.0000 meq | EXTENDED_RELEASE_TABLET | Freq: Every day | ORAL | 0 refills | Status: DC

## 2022-07-27 NOTE — Addendum Note (Signed)
Addended by: Donzetta Kohut on: 07/27/2022 02:07 PM   Modules accepted: Orders

## 2022-07-30 ENCOUNTER — Other Ambulatory Visit: Payer: Self-pay | Admitting: Family Medicine

## 2022-07-31 ENCOUNTER — Ambulatory Visit: Payer: Medicaid Other | Admitting: Family Medicine

## 2022-07-31 ENCOUNTER — Ambulatory Visit: Payer: Medicaid Other | Admitting: Vascular Surgery

## 2022-07-31 ENCOUNTER — Encounter: Payer: Self-pay | Admitting: Vascular Surgery

## 2022-07-31 VITALS — BP 161/89 | HR 90 | Temp 97.0°F | Resp 14 | Ht 66.0 in | Wt 174.0 lb

## 2022-07-31 DIAGNOSIS — I771 Stricture of artery: Secondary | ICD-10-CM

## 2022-07-31 NOTE — Progress Notes (Signed)
Patient name: Kaitlin Fleming MRN: [id number] DOB: [birth year] Sex: female  REASON FOR CONSULT: Celiac artery disease  HPI: Kaitlin Fleming is a 62 y.o. female, with history of rheumatoid arthritis, depression, hyperlipidemia, GERD that presents for evaluation of celiac artery disease.  She was just discharged from the hospital on 07/23/2022 with gastroenteritis.  She had a CT abdomen pelvis with contrast on 07/20/2022 with suspected narrowing of the celiac artery due to atheromatous plaque.  Her main complaint today is shortness of breath as well as fatigue and some indigestion.  States she has really not gotten her strength back since she had COVID around Christmas.  No significant weight loss or food fear.  Past Medical History:  Diagnosis Date   Allergy to alpha-gal    Celiac artery stenosis (HCC)    Chronic back pain    stenosis   Chronic pain    takes Lexapro daily   Complication of anesthesia    pt states after surgery in 2000 had to wear a heart monitor for 3 days.   DDD (degenerative disc disease)    Depression    GERD (gastroesophageal reflux disease)    takes OTC meds if needed   History of blood transfusion    as a child   History of bronchitis 13yrs ago   Hyperlipidemia    takes Fenofibrate daily   Joint pain    Joint swelling    Kidney stone    has one right now on the right side but not giving any problems   Osteoarthritis    Peripheral edema    takes HCTZ daily    Pneumonia 68yrs ago   hx of   PONV (postoperative nausea and vomiting)    Rheumatoid arthritis (HCC)    Rheumatoid arthritis (HCC)    Weakness    and tingling on left side    Past Surgical History:  Procedure Laterality Date   ABDOMINAL HYSTERECTOMY  05/14/1998   BACK SURGERY  03/10/2014   lumbar fusion   BASAL CELL CARCINOMA EXCISION  11/2021   LUMBAR LAMINECTOMY/DECOMPRESSION MICRODISCECTOMY Left 12/08/2013   Procedure: LEFT LUMBAR FOUR-FIVEW, LUMBAR FIVE-SACRAL ONE LAMINECTOMY;  Surgeon:  Floyce Stakes, MD;  Location: [facility];  Service: Neurosurgery;  Laterality: Left;   TUBAL LIGATION  24 yrs ago    Family History  Problem Relation Age of Onset   Colon cancer Maternal Grandmother    Colon cancer Paternal Uncle     SOCIAL HISTORY: Social History   Socioeconomic History   Marital status: Married    Spouse name: Not on file   Number of children: Not on file   Years of education: Not on file   Highest education level: Not on file  Occupational History   Not on file  Tobacco Use   Smoking status: Never   Smokeless tobacco: Never  Vaping Use   Vaping Use: Never used  Substance and Sexual Activity   Alcohol use: No   Drug use: No   Sexual activity: Yes    Birth control/protection: Surgical  Other Topics Concern   Not on file  Social History Narrative   Not on file   Social Determinants of Health   Financial Resource Strain: Not on file  Food Insecurity: No Food Insecurity (07/21/2022)   Hunger Vital Sign    Worried About Running Out of Food in the Last Year: Never true    Ran Out of Food in the Last Year: Never true  Transportation Needs: No  Transportation Needs (07/21/2022)   PRAPARE - Hydrologist (Medical): No    Lack of Transportation (Non-Medical): No  Physical Activity: Not on file  Stress: Not on file  Social Connections: Not on file  Intimate Partner Violence: Not At Risk (07/21/2022)   Humiliation, Afraid, Rape, and Kick questionnaire    Fear of Current or Ex-Partner: No    Emotionally Abused: No    Physically Abused: No    Sexually Abused: No    Allergies  Allergen Reactions   Alpha-Gal Anaphylaxis, Swelling, Hives and Other (See Comments)    Serology-proven Serology-proven    Macrobid [Nitrofurantoin] Shortness Of Breath and Rash   Pravastatin Other (See Comments) and Nausea And Vomiting    Rash & heart racing. Rash & heart racing. Rash & heart racing.   Macrolides And Ketolides    Methotrexate     Prevacid [Lansoprazole] Other (See Comments)    Started foaming at the mouth   Remicade [Infliximab]    Tofacitinib Other (See Comments)    Upper respiratory infections   Ibuprofen Hives   Penicillins Other (See Comments)    Breaks out Has patient had a PCN reaction causing immediate rash, facial/tongue/throat swelling, SOB or lightheadedness with hypotension: yes Has patient had a PCN reaction causing severe rash involving mucus membranes or skin necrosis: no Has patient had a PCN reaction that required hospitalization: no Has patient had a PCN reaction occurring within the last 10 years:no If all of the above answers are "NO", then may proceed with Cephalosporin use.   Sulfa Antibiotics Other (See Comments)    Breaks out    Current Outpatient Medications  Medication Sig Dispense Refill   aspirin EC 81 MG tablet Take 1 tablet (81 mg total) by mouth daily. Swallow whole. 30 tablet 1   azelastine (ASTELIN) 0.1 % nasal spray Place 2 sprays into both nostrils 2 (two) times daily. Use in each nostril as directed 30 mL 3   EPINEPHrine 0.3 mg/0.3 mL IJ SOAJ injection Inject 0.3 mg into the muscle as needed for anaphylaxis. 2 each 1   fluticasone (FLONASE) 50 MCG/ACT nasal spray Place 2 sprays into both nostrils daily. 16 g 3   loratadine (CLARITIN) 10 MG tablet Take 1 tablet (10 mg total) by mouth at bedtime. 30 tablet 1   oxyCODONE-acetaminophen (PERCOCET) 10-325 MG tablet Take 1 tablet by mouth every 6 (six) hours as needed for pain.      pantoprazole (PROTONIX) 40 MG tablet Take 40 mg by mouth daily.     potassium chloride SA (KLOR-CON M) 20 MEQ tablet Take 1 tablet (20 mEq total) by mouth daily. 30 tablet 0   pravastatin (PRAVACHOL) 10 MG tablet Take 1 tablet (10 mg total) by mouth daily. 90 tablet 0   predniSONE (DELTASONE) 5 MG tablet Take 1 tablet (5 mg total) by mouth daily. 30 tablet 1   sertraline (ZOLOFT) 50 MG tablet Take 1 tablet (50 mg total) by mouth daily. 90 tablet 3    triamcinolone cream (KENALOG) 0.1 % Apply topically.     Upadacitinib ER (RINVOQ) 15 MG TB24 Take 1 tablet by mouth daily.     No current facility-administered medications for this visit.    REVIEW OF SYSTEMS:  [X]  denotes positive finding, [ ]  denotes negative finding Cardiac  Comments:  Chest pain or chest pressure:    Shortness of breath upon exertion:    Short of breath when lying flat:    Irregular heart  rhythm:        Vascular    Pain in calf, thigh, or hip brought on by ambulation:    Pain in feet at night that wakes you up from your sleep:     Blood clot in your veins:    Leg swelling:         Pulmonary    Oxygen at home:    Productive cough:     Wheezing:     Shortness of breath x   Neurologic    Sudden weakness in arms or legs:     Sudden numbness in arms or legs:     Sudden onset of difficulty speaking or slurred speech:    Temporary loss of vision in one eye:     Problems with dizziness:         Gastrointestinal    Blood in stool:     Vomited blood:         Genitourinary    Burning when urinating:     Blood in urine:        Psychiatric    Major depression:         Hematologic    Bleeding problems:    Problems with blood clotting too easily:        Skin    Rashes or ulcers:        Constitutional    Fever or chills:      PHYSICAL EXAM: Vitals:   07/31/22 1114  BP: (!) 161/89  Pulse: 90  Resp: 14  Temp: (!) 97 F (36.1 C)  TempSrc: Temporal  SpO2: 96%  Weight: 174 lb (78.9 kg)  Height: 5\' 6"  (1.676 m)    GENERAL: The patient is a well-nourished female, in no acute distress. The vital signs are documented above. CARDIAC: There is a regular rate and rhythm.  VASCULAR:  Bilateral femoral pulses palpable Bilateral DP pulses palpable PULMONARY: No respiratory distress. ABDOMEN: Soft and non-tender. MUSCULOSKELETAL: There are no major deformities or cyanosis. NEUROLOGIC: No focal weakness or paresthesias are detected. SKIN: There are no  ulcers or rashes noted. PSYCHIATRIC: The patient has a normal affect.  DATA:   CT reviewed from 07/20/2022 and this is not a dedicated CTA but there does appear to be some atheromatous plaque in the celiac artery with a widely patent SMA  Assessment/Plan:  62 year old female presents for evaluation of suspected celiac artery narrowing due to atheromatous plaque noted on recent CT abdomen pelvis from 07/20/2022 when she was admitted with gastroenteritis.  I discussed that I do not think she needs any intervention on her celiac artery at this time.  She does not have any classic symptoms of chronic  mesenteric ischemia like food fear, weight loss or postprandial pain.  She is still recovering from her recent bout of gastroenteritis that required hospitalization.  Her main complaint today is shortness of breath with fatigue and indigestion.  I discussed none of this would improve with celiac artery intervention and stenting.  Her SMA appears patent.  She is on aspirin for risk reduction.  States she cannot tolerate statin.  I will see her in 6 months with mesenteric duplex.  Discussed she call with any new or worsening symptoms.   Marty Heck, MD [facility] Office: [phone]

## 2022-08-02 ENCOUNTER — Other Ambulatory Visit: Payer: Self-pay | Admitting: *Deleted

## 2022-08-02 ENCOUNTER — Other Ambulatory Visit: Payer: Medicaid Other

## 2022-08-02 DIAGNOSIS — E876 Hypokalemia: Secondary | ICD-10-CM | POA: Diagnosis not present

## 2022-08-03 ENCOUNTER — Other Ambulatory Visit: Payer: Self-pay

## 2022-08-03 DIAGNOSIS — I771 Stricture of artery: Secondary | ICD-10-CM

## 2022-08-03 LAB — CMP14+EGFR
ALT: 12 IU/L (ref 0–32)
AST: 22 IU/L (ref 0–40)
Albumin/Globulin Ratio: 1.2 (ref 1.2–2.2)
Albumin: 3.7 g/dL — ABNORMAL LOW (ref 3.9–4.9)
Alkaline Phosphatase: 103 IU/L (ref 44–121)
BUN/Creatinine Ratio: 9 — ABNORMAL LOW (ref 12–28)
BUN: 5 mg/dL — ABNORMAL LOW (ref 8–27)
Bilirubin Total: 0.4 mg/dL (ref 0.0–1.2)
CO2: 26 mmol/L (ref 20–29)
Calcium: 8.9 mg/dL (ref 8.7–10.3)
Chloride: 102 mmol/L (ref 96–106)
Creatinine, Ser: 0.58 mg/dL (ref 0.57–1.00)
Globulin, Total: 3.1 g/dL (ref 1.5–4.5)
Glucose: 100 mg/dL — ABNORMAL HIGH (ref 70–99)
Potassium: 4 mmol/L (ref 3.5–5.2)
Sodium: 141 mmol/L (ref 134–144)
Total Protein: 6.8 g/dL (ref 6.0–8.5)
eGFR: 102 mL/min/{1.73_m2} (ref >59)

## 2022-08-06 DIAGNOSIS — G894 Chronic pain syndrome: Secondary | ICD-10-CM | POA: Diagnosis not present

## 2022-08-06 DIAGNOSIS — M5416 Radiculopathy, lumbar region: Secondary | ICD-10-CM | POA: Diagnosis not present

## 2022-08-06 DIAGNOSIS — M4326 Fusion of spine, lumbar region: Secondary | ICD-10-CM | POA: Diagnosis not present

## 2022-08-06 DIAGNOSIS — M5459 Other low back pain: Secondary | ICD-10-CM | POA: Diagnosis not present

## 2022-08-08 ENCOUNTER — Other Ambulatory Visit: Payer: Self-pay | Admitting: Family Medicine

## 2022-08-08 DIAGNOSIS — R7309 Other abnormal glucose: Secondary | ICD-10-CM

## 2022-08-23 ENCOUNTER — Ambulatory Visit: Payer: Medicaid Other | Admitting: Family Medicine

## 2022-08-23 ENCOUNTER — Encounter: Payer: Self-pay | Admitting: Family Medicine

## 2022-08-23 ENCOUNTER — Telehealth: Payer: Self-pay | Admitting: Family Medicine

## 2022-08-23 VITALS — BP 148/80 | HR 83 | Temp 98.9°F | Ht 66.0 in | Wt 176.0 lb

## 2022-08-23 DIAGNOSIS — R899 Unspecified abnormal finding in specimens from other organs, systems and tissues: Secondary | ICD-10-CM | POA: Diagnosis not present

## 2022-08-23 DIAGNOSIS — R7309 Other abnormal glucose: Secondary | ICD-10-CM

## 2022-08-23 DIAGNOSIS — L739 Follicular disorder, unspecified: Secondary | ICD-10-CM

## 2022-08-23 DIAGNOSIS — I771 Stricture of artery: Secondary | ICD-10-CM

## 2022-08-23 DIAGNOSIS — B001 Herpesviral vesicular dermatitis: Secondary | ICD-10-CM | POA: Diagnosis not present

## 2022-08-23 DIAGNOSIS — M5136 Other intervertebral disc degeneration, lumbar region: Secondary | ICD-10-CM | POA: Diagnosis not present

## 2022-08-23 DIAGNOSIS — I10 Essential (primary) hypertension: Secondary | ICD-10-CM | POA: Diagnosis not present

## 2022-08-23 LAB — BAYER DCA HB A1C WAIVED: HB A1C (BAYER DCA - WAIVED): 5.4 % (ref 4.8–5.6)

## 2022-08-23 MED ORDER — DOXYCYCLINE HYCLATE 100 MG PO TABS
100.0000 mg | ORAL_TABLET | Freq: Two times a day (BID) | ORAL | 0 refills | Status: AC
Start: 2022-08-23 — End: 2022-08-30

## 2022-08-23 MED ORDER — VALACYCLOVIR HCL 1 G PO TABS: 1000.0000 mg | ORAL_TABLET | Freq: Two times a day (BID) | ORAL | 0 refills | Status: AC

## 2022-08-23 MED ORDER — LISINOPRIL 5 MG PO TABS: 5.0000 mg | ORAL_TABLET | Freq: Every day | ORAL | 0 refills | Status: DC

## 2022-08-23 NOTE — Progress Notes (Signed)
Acute Office Visit  Subjective:  Patient ID: Kaitlin Fleming, female    DOB: 10/16/1960, 62 y.o.   MRN: [id number]  Chief Complaint  Patient presents with   Medical Management of Chronic Issues    1 month follow up   Kaitlin Fleming is in today for follow up of her BP and celiac artery stenosis  Continues to have pain in her abdomen. States that yesterday she had so much pain that she felt she was "having a heart attack"  Continues to have fatigue and loss of appetite.   Patient has been taking BP at home in the mornings for the last 4 weeks. Per her home monitor, her BP is averaging 145-163/82-90 mmHg  States that she was taking HCTZ in the past and it would drain her physically.   Irritating bumps on perineum that started a couple of weeks ago. Denies leaking and incontinence. Does not wear a pad. Has been trying Lotrimin. Denies itching. States that they feel "hot and burn" Sitting with pressure causes the pain to worsen.   Complains of right leg pain from bulging disc in her back. She was previously seen by neurosurgery with [facility]. States that [facility] because the doctor there is scared that she is going to sue him.  Dinah BeersSaw Kilpatrick, MD in 02/29/2020   ROS As per HPI  Objective:  BP (!) 148/80   Pulse 83   Temp 98.9 F (37.2 C)   Ht 5\' 6"  (1.676 m)   Wt 176 lb (79.8 kg)   SpO2 97%   BMI 28.41 kg/m   Physical Exam Exam conducted with a chaperone present.  Constitutional:      General: She is not in acute distress.    Appearance: Normal appearance. She is not ill-appearing, toxic-appearing or diaphoretic.  Cardiovascular:     Rate and Rhythm: Normal rate.     Pulses: Normal pulses.     Heart sounds: Normal heart sounds. No murmur heard.    No gallop.  Pulmonary:     Effort: Pulmonary effort is normal. No respiratory distress.     Breath sounds: Normal breath sounds. No stridor. No wheezing, rhonchi or rales.  Abdominal:      General: Abdomen is flat. Bowel sounds are normal. There is no distension.     Palpations: Abdomen is soft. There is no mass.     Tenderness: There is no abdominal tenderness. There is no guarding or rebound.     Hernia: No hernia is present.  Genitourinary:    Labia:        Right: Rash present.      Comments: Multiple small raised erythematous pustules along right labia and perineum area  Skin:    General: Skin is warm.     Capillary Refill: Capillary refill takes less than 2 seconds.  Neurological:     General: No focal deficit present.     Mental Status: She is alert and oriented to person, place, and time. Mental status is at baseline.     Motor: No weakness.  Psychiatric:        Mood and Affect: Mood normal.        Behavior: Behavior normal.        Thought Content: Thought content normal.        Judgment: Judgment normal.    Assessment & Plan:  1. Primary hypertension BP remains elevated at home and in office. Will start low dose lisinopril for BP control.  -  lisinopril (ZESTRIL) 5 MG tablet; Take 1 tablet (5 mg total) by mouth daily.  Dispense: 30 tablet; Refill: 0  2. Elevated glucose Lab Results  Component Value Date   HGBA1C 5.4 08/23/2022  A1C is within normal level. Will continue to monitor with future labs.   3. Lumbar degenerative disc disease Referral placed as below for patient to return to Neurosurgery for her pain.  - Ambulatory referral to Neurosurgery  4. Abnormal laboratory test Labs as below. Will communicate results to patient once available.  Labs were abnormal during hospitalization and have not yet normalized. Will follow up with labs as below.  - BMP8+EGFR  5. Fever blister She has history of fever blister. Medication as below.  - valACYclovir (VALTREX) 1000 MG tablet; Take 1 tablet (1,000 mg total) by mouth 2 (two) times daily for 10 days.  Dispense: 20 tablet; Refill: 0  6. Folliculitis Medication as below for folliculitis.  - doxycycline  (VIBRA-TABS) 100 MG tablet; Take 1 tablet (100 mg total) by mouth 2 (two) times daily for 7 days.  Dispense: 14 tablet; Refill: 0  7. Celiac artery stenosis Instructed patient to follow up with vascular and vein about continued pain in abdomen and concerned with celiac artery stenosis. Per patient pravastatin discontinued by Dr. Chestine Spore.   Of note, she has a history of GERD. Per chart review was last seen by GI in 2022 and recommended to have a dilation of her esophagus for dysphagia and did not have procedure.   The above assessment and management plan was discussed with the patient. The patient verbalized understanding of and has agreed to the management plan using shared-decision making. Patient is aware to call the clinic if they develop any new symptoms or if symptoms fail to improve or worsen. Patient is aware when to return to the clinic for a follow-up visit. Patient educated on when it is appropriate to go to the emergency department.   Neale Burly, DNP-FNP [facility] [address] [place], [place] [address] [phone]

## 2022-08-24 LAB — BMP8+EGFR
BUN/Creatinine Ratio: 13 (ref 12–28)
BUN: 9 mg/dL (ref 8–27)
CO2: 26 mmol/L (ref 20–29)
Calcium: 9.4 mg/dL (ref 8.7–10.3)
Chloride: 102 mmol/L (ref 96–106)
Creatinine, Ser: 0.68 mg/dL (ref 0.57–1.00)
Glucose: 91 mg/dL (ref 70–99)
Potassium: 3.9 mmol/L (ref 3.5–5.2)
Sodium: 142 mmol/L (ref 134–144)
eGFR: 98 mL/min/{1.73_m2} (ref >59)

## 2022-08-24 NOTE — Telephone Encounter (Signed)
Pt says that she cannot take a capsule pill due to alpha  gal.

## 2022-08-24 NOTE — Telephone Encounter (Signed)
Patient was prescribed Doxycycline, she said she has alpha gal allergy and cannot take this medication needs something else prescribed.

## 2022-08-24 NOTE — Telephone Encounter (Signed)
Patient calling back to check on medication

## 2022-08-27 NOTE — Telephone Encounter (Signed)
Why cannot she take the doxycycline?  It looks like she has an allergy to sulfa on the list and has a severe allergy to penicillin on her list so it really limits the antibiotic selection up to doxycycline?  Cephalosporins would be another option but with her severe penicillin allergy then that is not recommended.  Doxycycline is the only thing that covers this without being on her allergy list.

## 2022-08-27 NOTE — Telephone Encounter (Signed)
Is there anything else that can be sent in for folliculitis besides the doxy?

## 2022-08-27 NOTE — Telephone Encounter (Signed)
Pt has been informed that the tablet form of Doxy is fine for her to take. Made aware if it was a capsule then she could have a interaction with the alpha gal.  Pt states that she will go ahead and take it. States that years ago for a dental procedure the had hives with Doxy. States that she had to take multiple doses of it. Informed pt that she is very limited on what she can take due to her allergies. Pt will call our office if she has hives again with doxy.

## 2022-08-27 NOTE — Telephone Encounter (Signed)
As far as I can tell doxycycline is not a capsule it is a tablet

## 2022-09-04 DIAGNOSIS — Z79899 Other long term (current) drug therapy: Secondary | ICD-10-CM | POA: Diagnosis not present

## 2022-09-04 DIAGNOSIS — M4326 Fusion of spine, lumbar region: Secondary | ICD-10-CM | POA: Diagnosis not present

## 2022-09-04 DIAGNOSIS — G894 Chronic pain syndrome: Secondary | ICD-10-CM | POA: Diagnosis not present

## 2022-09-04 DIAGNOSIS — Z79891 Long term (current) use of opiate analgesic: Secondary | ICD-10-CM | POA: Diagnosis not present

## 2022-09-04 DIAGNOSIS — M5459 Other low back pain: Secondary | ICD-10-CM | POA: Diagnosis not present

## 2022-09-04 DIAGNOSIS — M5416 Radiculopathy, lumbar region: Secondary | ICD-10-CM | POA: Diagnosis not present

## 2022-09-11 DIAGNOSIS — M5442 Lumbago with sciatica, left side: Secondary | ICD-10-CM | POA: Diagnosis not present

## 2022-09-11 DIAGNOSIS — M48061 Spinal stenosis, lumbar region without neurogenic claudication: Secondary | ICD-10-CM | POA: Diagnosis not present

## 2022-09-11 DIAGNOSIS — M47819 Spondylosis without myelopathy or radiculopathy, site unspecified: Secondary | ICD-10-CM | POA: Diagnosis not present

## 2022-09-11 DIAGNOSIS — G8929 Other chronic pain: Secondary | ICD-10-CM | POA: Diagnosis not present

## 2022-09-21 ENCOUNTER — Telehealth: Payer: Self-pay | Admitting: Family Medicine

## 2022-09-21 ENCOUNTER — Other Ambulatory Visit: Payer: Self-pay | Admitting: Family Medicine

## 2022-09-21 DIAGNOSIS — I10 Essential (primary) hypertension: Secondary | ICD-10-CM

## 2022-09-21 DIAGNOSIS — M47819 Spondylosis without myelopathy or radiculopathy, site unspecified: Secondary | ICD-10-CM | POA: Diagnosis not present

## 2022-09-25 ENCOUNTER — Ambulatory Visit: Payer: Medicaid Other | Admitting: Family Medicine

## 2022-09-28 DIAGNOSIS — R070 Pain in throat: Secondary | ICD-10-CM | POA: Diagnosis not present

## 2022-09-28 DIAGNOSIS — R059 Cough, unspecified: Secondary | ICD-10-CM | POA: Diagnosis not present

## 2022-09-28 DIAGNOSIS — J0100 Acute maxillary sinusitis, unspecified: Secondary | ICD-10-CM | POA: Diagnosis not present

## 2022-10-02 DIAGNOSIS — G894 Chronic pain syndrome: Secondary | ICD-10-CM | POA: Diagnosis not present

## 2022-10-02 DIAGNOSIS — M5416 Radiculopathy, lumbar region: Secondary | ICD-10-CM | POA: Diagnosis not present

## 2022-10-02 DIAGNOSIS — M4326 Fusion of spine, lumbar region: Secondary | ICD-10-CM | POA: Diagnosis not present

## 2022-10-02 DIAGNOSIS — M5459 Other low back pain: Secondary | ICD-10-CM | POA: Diagnosis not present

## 2022-10-04 ENCOUNTER — Ambulatory Visit: Payer: Medicaid Other | Admitting: Family Medicine

## 2022-10-09 ENCOUNTER — Encounter: Payer: Self-pay | Admitting: Family Medicine

## 2022-10-09 ENCOUNTER — Ambulatory Visit: Payer: Medicaid Other | Admitting: Family Medicine

## 2022-10-09 VITALS — BP 119/75 | HR 85 | Ht 66.0 in | Wt 173.0 lb

## 2022-10-09 DIAGNOSIS — I771 Stricture of artery: Secondary | ICD-10-CM

## 2022-10-09 DIAGNOSIS — Z6828 Body mass index (BMI) 28.0-28.9, adult: Secondary | ICD-10-CM

## 2022-10-09 DIAGNOSIS — I10 Essential (primary) hypertension: Secondary | ICD-10-CM

## 2022-10-09 MED ORDER — LISINOPRIL 5 MG PO TABS
5.0000 mg | ORAL_TABLET | Freq: Every day | ORAL | 2 refills | Status: DC
Start: 2022-10-22 — End: 2022-10-19

## 2022-10-09 NOTE — Progress Notes (Signed)
Acute Office Visit  Subjective:  Patient ID: Kaitlin Fleming, female    DOB: 1961-05-04, 62 y.o.   MRN: [id number]  Chief Complaint  Patient presents with   Medical Management of Chronic Issues   Hypertension    Hypertension   Patient is in today for follow up of her blood pressure.  She has been taking her BP at home.  States that DBP has been 75, with a low of 65. SBP is 110-115.  Endorses slight headache that resolved. Denies SOB, palpitations, swelling in her lower extremities. States that she was recently started on antibiotic and she was not able to take RA medication because of that, so her RA is acting up.   She is having a baby shark party for her 60 year old granddaughter (in her custody) this week.   ROS As per HPI   Objective:  BP 119/75   Pulse 85   Ht 5\' 6"  (1.676 m)   Wt 173 lb (78.5 kg)   SpO2 97%   BMI 27.92 kg/m   Physical Exam Constitutional:      General: She is awake. She is not in acute distress.    Appearance: Normal appearance. She is well-developed and well-groomed. She is not ill-appearing, toxic-appearing or diaphoretic.  Cardiovascular:     Rate and Rhythm: Normal rate.     Pulses: Normal pulses.          Radial pulses are 2+ on the right side and 2+ on the left side.       Posterior tibial pulses are 2+ on the right side and 2+ on the left side.     Heart sounds: Normal heart sounds. No murmur heard.    No gallop.  Pulmonary:     Effort: Pulmonary effort is normal. No respiratory distress.     Breath sounds: Normal breath sounds. No stridor. No wheezing, rhonchi or rales.  Musculoskeletal:     Cervical back: Full passive range of motion without pain and neck supple.     Right lower leg: No edema.     Left lower leg: No edema.  Skin:    General: Skin is warm.     Capillary Refill: Capillary refill takes less than 2 seconds.  Neurological:     General: No focal deficit present.     Mental Status: She is alert, oriented to person,  place, and time and easily aroused. Mental status is at baseline.     GCS: GCS eye subscore is 4. GCS verbal subscore is 5. GCS motor subscore is 6.     Motor: No weakness.  Psychiatric:        Attention and Perception: Attention and perception normal.        Mood and Affect: Mood and affect normal.        Speech: Speech normal.        Behavior: Behavior normal. Behavior is cooperative.        Thought Content: Thought content normal. Thought content does not include homicidal or suicidal ideation. Thought content does not include homicidal or suicidal plan.        Cognition and Memory: Cognition and memory normal.        Judgment: Judgment normal.       10/09/2022   10:04 AM 08/23/2022    1:02 PM 07/26/2022    2:25 PM  Depression screen PHQ 2/9  Decreased Interest 0 0 0  Down, Depressed, Hopeless 0 0 0  PHQ - 2 Score  0 0 0  Altered sleeping 0 1 1  Tired, decreased energy 1 1 1   Change in appetite 0 1 1  Feeling bad or failure about yourself  0 0 0  Trouble concentrating 0 0 0  Moving slowly or fidgety/restless 1 1 1   Suicidal thoughts 0 0 0  PHQ-9 Score 2 4 4   Difficult doing work/chores Somewhat difficult Somewhat difficult       10/09/2022   10:05 AM 08/23/2022    1:03 PM 07/26/2022    2:25 PM 03/21/2022    9:48 AM  GAD 7 : Generalized Anxiety Score  Nervous, Anxious, on Edge 0 0 0 0  Control/stop worrying 0 0 0 0  Worry too much - different things 0 1 0 0  Trouble relaxing 0 1 0 0  Restless 0 0 0 0  Easily annoyed or irritable 0 0 0 0  Afraid - awful might happen 0 0 0 0  Total GAD 7 Score 0 2 0 0  Anxiety Difficulty Not difficult at all Somewhat difficult Not difficult at all Not difficult at all    Assessment & Plan:  1. Primary hypertension Well controlled on current regimen. Will refill medication as below. Recently refilled. Set to start one month from recent refill.  - lisinopril (ZESTRIL) 5 MG tablet; Take 1 tablet (5 mg total) by mouth daily.  Dispense: 30  tablet; Refill: 2  The above assessment and management plan was discussed with the patient. The patient verbalized understanding of and has agreed to the management plan using shared-decision making. Patient is aware to call the clinic if they develop any new symptoms or if symptoms fail to improve or worsen. Patient is aware when to return to the clinic for a follow-up visit. Patient educated on when it is appropriate to go to the emergency department.   Return in about 3 months (around 01/09/2023) for Chronic Condition Follow up.  Neale Burly, DNP-FNP [facility] [address] [place], [place] [address] [phone]

## 2022-10-19 ENCOUNTER — Emergency Department (HOSPITAL_COMMUNITY): Payer: Medicaid Other

## 2022-10-19 ENCOUNTER — Encounter (HOSPITAL_COMMUNITY): Payer: Self-pay

## 2022-10-19 ENCOUNTER — Emergency Department (HOSPITAL_COMMUNITY)
Admission: EM | Admit: 2022-10-19 | Discharge: 2022-10-19 | Disposition: A | Discharge status: Home / Self Care | Payer: Medicaid Other | Attending: Emergency Medicine | Admitting: Emergency Medicine

## 2022-10-19 ENCOUNTER — Other Ambulatory Visit: Payer: Self-pay

## 2022-10-19 ENCOUNTER — Telehealth: Payer: Self-pay | Admitting: *Deleted

## 2022-10-19 DIAGNOSIS — I10 Essential (primary) hypertension: Secondary | ICD-10-CM

## 2022-10-19 DIAGNOSIS — R600 Localized edema: Secondary | ICD-10-CM | POA: Diagnosis not present

## 2022-10-19 DIAGNOSIS — Z7982 Long term (current) use of aspirin: Secondary | ICD-10-CM | POA: Diagnosis not present

## 2022-10-19 DIAGNOSIS — M79605 Pain in left leg: Secondary | ICD-10-CM | POA: Insufficient documentation

## 2022-10-19 MED ORDER — LISINOPRIL 5 MG PO TABS
5.0000 mg | ORAL_TABLET | Freq: Every day | ORAL | 2 refills | Status: DC
Start: 2022-10-19 — End: 2022-12-20

## 2022-10-19 NOTE — Telephone Encounter (Signed)
TC from pt needing her Lisinopril, next refill was sent in w/ a future date of 10/22/22 she did get it on 09/21/22 the earliest it can be filled is 10/21/22 but pharmacy will be closed. Resending script without a future fill date.

## 2022-10-19 NOTE — ED Triage Notes (Signed)
Pt with left inner calf bruise that has been present 2-3 weeks.  States last night the pain has moved up her leg and she was unable to sleep last night.

## 2022-10-19 NOTE — ED Provider Notes (Signed)
[place] EMERGENCY DEPARTMENT AT [facility] Provider Note   CSN: [id number] Arrival date & time: 10/19/22  1058     History  Chief Complaint  Patient presents with   Leg Pain    Kaitlin Fleming is a 62 y.o. female.   Leg Pain      Home Medications Prior to Admission medications   Medication Sig Start Date End Date Taking? Authorizing Provider  aspirin EC 81 MG tablet Take 81 mg by mouth daily. Swallow whole.    [provider]  azelastine (ASTELIN) 0.1 % nasal spray Place 2 sprays into both nostrils 2 (two) times daily. Use in each nostril as directed 02/19/22   Birder Robson, MD  cyclobenzaprine (FLEXERIL) 5 MG tablet Take 5 mg by mouth at bedtime as needed. 08/06/22   [provider]  EPINEPHrine 0.3 mg/0.3 mL IJ SOAJ injection Inject 0.3 mg into the muscle as needed for anaphylaxis. 02/19/22   Birder Robson, MD  fluticasone (FLONASE) 50 MCG/ACT nasal spray Place 2 sprays into both nostrils daily. 02/19/22   Birder Robson, MD  HYDROcodone-acetaminophen [facility]) 10-325 MG tablet Take by mouth. 03/23/22   [provider]  lisinopril (ZESTRIL) 5 MG tablet Take 1 tablet (5 mg total) by mouth daily. 10/19/22   Milian, Aleen Campi, FNP  loratadine (CLARITIN) 10 MG tablet Take 1 tablet (10 mg total) by mouth at bedtime. 08/04/17   Vassie Loll, MD  pantoprazole (PROTONIX) 40 MG tablet Take 40 mg by mouth daily.    [provider]  potassium chloride SA (KLOR-CON M) 20 MEQ tablet Take 1 tablet (20 mEq total) by mouth daily. 07/27/22 08/26/22  Arrie Senate, FNP  sertraline (ZOLOFT) 50 MG tablet Take 1 tablet (50 mg total) by mouth daily. 08/19/19   Jannifer Rodney A, FNP  triamcinolone cream (KENALOG) 0.1 % Apply topically. 01/02/22   [provider]  Upadacitinib ER (RINVOQ) 15 MG TB24 Take 1 tablet by mouth daily. 11/16/19   [provider]  ZTLIDO 1.8 % PTCH Apply topically. 04/09/22   [provider]       Allergies    Alpha-gal, Macrobid [nitrofurantoin], Pravastatin, Macrolides and ketolides, Methotrexate, Prevacid [lansoprazole], Remicade [infliximab], Tofacitinib, Ibuprofen, Penicillins, and Sulfa antibiotics    Review of Systems   Review of Systems  Physical Exam Updated Vital Signs BP (!) 142/70 (BP Location: Right Arm)   Pulse 82   Temp 98 F (36.7 C) (Oral)   Resp 17   Ht 5\' 6"  (1.676 m)   Wt 79.4 kg   SpO2 100%   BMI 28.25 kg/m  Physical Exam  ED Results / Procedures / Treatments   Labs (all labs ordered are listed, but only abnormal results are displayed) Labs Reviewed - No data to display  EKG None  Radiology US Venous Img Lower  Left (DVT Study)  Result Date: 10/19/2022 CLINICAL DATA:  Left lower extremity pain and edema for the past 2-3 weeks. Evaluate for DVT. EXAM: LEFT LOWER EXTREMITY VENOUS DOPPLER ULTRASOUND TECHNIQUE: Gray-scale sonography with graded compression, as well as color Doppler and duplex ultrasound were performed to evaluate the lower extremity deep venous systems from the level of the common femoral vein and including the common femoral, femoral, profunda femoral, popliteal and calf veins including the posterior tibial, peroneal and gastrocnemius veins when visible. The superficial great saphenous vein was also interrogated. Spectral Doppler was utilized to evaluate flow at rest and with distal augmentation maneuvers in the common  femoral, femoral and popliteal veins. COMPARISON:  Left lower extremity venous Doppler ultrasound - 02/04/2020 (negative) FINDINGS: Contralateral Common Femoral Vein: Respiratory phasicity is normal and symmetric with the symptomatic side. No evidence of thrombus. Normal compressibility. Common Femoral Vein: No evidence of thrombus. Normal compressibility, respiratory phasicity and response to augmentation. Saphenofemoral Junction: No evidence of thrombus. Normal compressibility and flow on color Doppler imaging. Profunda  Femoral Vein: No evidence of thrombus. Normal compressibility and flow on color Doppler imaging. Femoral Vein: No evidence of thrombus. Normal compressibility, respiratory phasicity and response to augmentation. Popliteal Vein: No evidence of thrombus. Normal compressibility, respiratory phasicity and response to augmentation. Calf Veins: No evidence of thrombus. Normal compressibility and flow on color Doppler imaging. Superficial Great Saphenous Vein: No evidence of thrombus. Normal compressibility. Other Findings:  None. IMPRESSION: No evidence of DVT within the left lower extremity. Electronically Signed   By: Simonne Come M.D.   On: 10/19/2022 14:46    Procedures Procedures    Medications Ordered in ED Medications - No data to display  ED Course/ Medical Decision Making/ A&P                             Medical Decision Making DDx: DVT, muscle strain, contusion, other ED course: Patient complaining of left medial calf pain that is now gone up to her left medial thigh over the past several days.  She denies any injury or trauma.  States the pain kept her awake last night. Patient had no obvious swelling, no trauma, no redness and no pitting edema.  Given location of her pain I did order ultrasound to rule out a DVT.  She denies history of this, DVT study was negative.  Went back to evaluate the patient further and give her results and she was not present.  I checked several more times, and I called her home and mobile phones listed in her chart without difficulty reaching her.           Final Clinical Impression(s) / ED Diagnoses Final diagnoses:  Left leg pain    Rx / DC Orders ED Discharge Orders     None         Ma Rings, PA-C 10/19/22 1708    Terrilee Files, MD 10/19/22 1800

## 2022-10-22 ENCOUNTER — Telehealth: Payer: Self-pay

## 2022-10-22 DIAGNOSIS — M47819 Spondylosis without myelopathy or radiculopathy, site unspecified: Secondary | ICD-10-CM | POA: Diagnosis not present

## 2022-10-23 NOTE — Transitions of Care (Post Inpatient/ED Visit) (Signed)
   10/24/2022  Name: Kaitlin Fleming MRN: [id number] DOB: Jun 26, 1960  Today's TOC FU Call Status: Today's TOC FU Call Status:: Successful TOC FU Call Competed Unsuccessful Call (1st Attempt) Date: 10/22/22 Unsuccessful Call (2nd Attempt) Date: 10/23/22 [facility] FU Call Complete Date: 10/24/22  Attempted to reach the patient regarding the most recent Inpatient/ED visit.  Follow Up Plan:  Signature  Fredirick Maudlin

## 2022-10-24 ENCOUNTER — Telehealth: Payer: Self-pay

## 2022-10-24 NOTE — Telephone Encounter (Signed)
Pt called triage with c/o LLE pain for about a week with no swelling, color change or coolness. She had a negative DVT study in the ED this past week. She is unsure if it is related to her RA. Pt thinks this is a good possibility and is going to call her provider to f/u with them. She is aware to call us back if anything changes/worsens and we can schedule her for ABI and to see APP, per Corrie.

## 2022-10-29 DIAGNOSIS — L821 Other seborrheic keratosis: Secondary | ICD-10-CM | POA: Diagnosis not present

## 2022-10-29 DIAGNOSIS — L578 Other skin changes due to chronic exposure to nonionizing radiation: Secondary | ICD-10-CM | POA: Diagnosis not present

## 2022-10-29 DIAGNOSIS — D225 Melanocytic nevi of trunk: Secondary | ICD-10-CM | POA: Diagnosis not present

## 2022-10-29 DIAGNOSIS — L570 Actinic keratosis: Secondary | ICD-10-CM | POA: Diagnosis not present

## 2022-10-29 DIAGNOSIS — L720 Epidermal cyst: Secondary | ICD-10-CM | POA: Diagnosis not present

## 2022-10-30 DIAGNOSIS — M4326 Fusion of spine, lumbar region: Secondary | ICD-10-CM | POA: Diagnosis not present

## 2022-10-30 DIAGNOSIS — Z79891 Long term (current) use of opiate analgesic: Secondary | ICD-10-CM | POA: Diagnosis not present

## 2022-10-30 DIAGNOSIS — Z79899 Other long term (current) drug therapy: Secondary | ICD-10-CM | POA: Diagnosis not present

## 2022-10-30 DIAGNOSIS — M5459 Other low back pain: Secondary | ICD-10-CM | POA: Diagnosis not present

## 2022-10-30 DIAGNOSIS — G894 Chronic pain syndrome: Secondary | ICD-10-CM | POA: Diagnosis not present

## 2022-10-30 DIAGNOSIS — M5416 Radiculopathy, lumbar region: Secondary | ICD-10-CM | POA: Diagnosis not present

## 2022-10-31 ENCOUNTER — Encounter (HOSPITAL_COMMUNITY): Payer: Medicaid Other

## 2022-11-06 ENCOUNTER — Ambulatory Visit: Payer: Medicaid Other

## 2022-11-12 DIAGNOSIS — M47819 Spondylosis without myelopathy or radiculopathy, site unspecified: Secondary | ICD-10-CM | POA: Insufficient documentation

## 2022-11-19 ENCOUNTER — Other Ambulatory Visit: Payer: Self-pay | Admitting: Gastroenterology

## 2022-11-19 DIAGNOSIS — K2100 Gastro-esophageal reflux disease with esophagitis, without bleeding: Secondary | ICD-10-CM

## 2022-11-19 NOTE — Telephone Encounter (Signed)
Needs appt before 01/2023 for refills.

## 2022-11-27 DIAGNOSIS — M4326 Fusion of spine, lumbar region: Secondary | ICD-10-CM | POA: Diagnosis not present

## 2022-11-27 DIAGNOSIS — G894 Chronic pain syndrome: Secondary | ICD-10-CM | POA: Diagnosis not present

## 2022-11-27 DIAGNOSIS — M5459 Other low back pain: Secondary | ICD-10-CM | POA: Diagnosis not present

## 2022-11-27 DIAGNOSIS — M5416 Radiculopathy, lumbar region: Secondary | ICD-10-CM | POA: Diagnosis not present

## 2022-12-10 DIAGNOSIS — M47819 Spondylosis without myelopathy or radiculopathy, site unspecified: Secondary | ICD-10-CM | POA: Diagnosis not present

## 2022-12-11 ENCOUNTER — Ambulatory Visit: Payer: Medicaid Other | Admitting: Family Medicine

## 2022-12-11 ENCOUNTER — Telehealth: Payer: Self-pay | Admitting: Family Medicine

## 2022-12-11 NOTE — Addendum Note (Signed)
Addended by: Neale Burly on: 12/11/2022 10:57 AM   Modules accepted: Orders

## 2022-12-19 ENCOUNTER — Other Ambulatory Visit: Payer: Medicaid Other

## 2022-12-19 DIAGNOSIS — Z6828 Body mass index (BMI) 28.0-28.9, adult: Secondary | ICD-10-CM | POA: Diagnosis not present

## 2022-12-19 DIAGNOSIS — I771 Stricture of artery: Secondary | ICD-10-CM | POA: Diagnosis not present

## 2022-12-19 DIAGNOSIS — I10 Essential (primary) hypertension: Secondary | ICD-10-CM

## 2022-12-19 LAB — CMP14+EGFR
ALT: 12 IU/L (ref 0–32)
AST: 12 IU/L (ref 0–40)
Albumin: 4 g/dL (ref 3.9–4.9)
Alkaline Phosphatase: 125 IU/L — ABNORMAL HIGH (ref 44–121)
BUN/Creatinine Ratio: 8 — ABNORMAL LOW (ref 12–28)
BUN: 6 mg/dL — ABNORMAL LOW (ref 8–27)
Bilirubin Total: 0.2 mg/dL (ref 0.0–1.2)
CO2: 27 mmol/L (ref 20–29)
Calcium: 9 mg/dL (ref 8.7–10.3)
Chloride: 102 mmol/L (ref 96–106)
Creatinine, Ser: 0.71 mg/dL (ref 0.57–1.00)
Globulin, Total: 2.6 g/dL (ref 1.5–4.5)
Glucose: 81 mg/dL (ref 70–99)
Potassium: 3.7 mmol/L (ref 3.5–5.2)
Sodium: 142 mmol/L (ref 134–144)
Total Protein: 6.6 g/dL (ref 6.0–8.5)
eGFR: 96 mL/min/{1.73_m2} (ref >59)

## 2022-12-19 LAB — LIPID PANEL
Chol/HDL Ratio: 5.7 ratio — ABNORMAL HIGH (ref 0.0–4.4)
Cholesterol, Total: 226 mg/dL — ABNORMAL HIGH (ref 100–199)
HDL: 40 mg/dL (ref >39)
LDL Chol Calc (NIH): 143 mg/dL — ABNORMAL HIGH (ref 0–99)
Triglycerides: 235 mg/dL — ABNORMAL HIGH (ref 0–149)
VLDL Cholesterol Cal: 43 mg/dL — ABNORMAL HIGH (ref 5–40)

## 2022-12-19 LAB — CBC WITH DIFFERENTIAL/PLATELET
Basophils Absolute: 0 10*3/uL (ref 0.0–0.2)
Basos: 1 %
EOS (ABSOLUTE): 0.1 10*3/uL (ref 0.0–0.4)
Eos: 2 %
Hematocrit: 35.4 % (ref 34.0–46.6)
Hemoglobin: 11.7 g/dL (ref 11.1–15.9)
Immature Grans (Abs): 0 10*3/uL (ref 0.0–0.1)
Immature Granulocytes: 0 %
Lymphocytes Absolute: 1.1 10*3/uL (ref 0.7–3.1)
Lymphs: 19 %
MCH: 29 pg (ref 26.6–33.0)
MCHC: 33.1 g/dL (ref 31.5–35.7)
MCV: 88 fL (ref 79–97)
Monocytes Absolute: 0.5 10*3/uL (ref 0.1–0.9)
Monocytes: 8 %
Neutrophils Absolute: 4.1 10*3/uL (ref 1.4–7.0)
Neutrophils: 70 %
Platelets: 262 10*3/uL (ref 150–450)
RBC: 4.04 x10E6/uL (ref 3.77–5.28)
RDW: 13.4 % (ref 11.7–15.4)
WBC: 5.8 10*3/uL (ref 3.4–10.8)

## 2022-12-19 LAB — VITAMIN D 25 HYDROXY (VIT D DEFICIENCY, FRACTURES)

## 2022-12-19 LAB — TSH

## 2022-12-20 ENCOUNTER — Encounter: Payer: Self-pay | Admitting: Family Medicine

## 2022-12-20 ENCOUNTER — Ambulatory Visit: Payer: Medicaid Other | Admitting: Family Medicine

## 2022-12-20 VITALS — BP 124/75 | HR 85 | Temp 98.3°F | Ht 66.0 in | Wt 174.0 lb

## 2022-12-20 DIAGNOSIS — K2100 Gastro-esophageal reflux disease with esophagitis, without bleeding: Secondary | ICD-10-CM

## 2022-12-20 DIAGNOSIS — E782 Mixed hyperlipidemia: Secondary | ICD-10-CM

## 2022-12-20 DIAGNOSIS — M069 Rheumatoid arthritis, unspecified: Secondary | ICD-10-CM

## 2022-12-20 DIAGNOSIS — F419 Anxiety disorder, unspecified: Secondary | ICD-10-CM | POA: Diagnosis not present

## 2022-12-20 DIAGNOSIS — I10 Essential (primary) hypertension: Secondary | ICD-10-CM

## 2022-12-20 DIAGNOSIS — E559 Vitamin D deficiency, unspecified: Secondary | ICD-10-CM | POA: Diagnosis not present

## 2022-12-20 DIAGNOSIS — F324 Major depressive disorder, single episode, in partial remission: Secondary | ICD-10-CM

## 2022-12-20 DIAGNOSIS — Z91018 Allergy to other foods: Secondary | ICD-10-CM | POA: Diagnosis not present

## 2022-12-20 DIAGNOSIS — R300 Dysuria: Secondary | ICD-10-CM | POA: Diagnosis not present

## 2022-12-20 LAB — URINALYSIS, ROUTINE W REFLEX MICROSCOPIC
Bilirubin, UA: NEGATIVE
Glucose, UA: NEGATIVE
Ketones, UA: NEGATIVE
Nitrite, UA: POSITIVE — AB
Protein,UA: NEGATIVE
Specific Gravity, UA: <1.005 — ABNORMAL LOW (ref 1.005–1.030)
Urobilinogen, Ur: 0.2 mg/dL (ref 0.2–1.0)
pH, UA: 5.5 (ref 5.0–7.5)

## 2022-12-20 LAB — MICROSCOPIC EXAMINATION: Renal Epithel, UA: NONE SEEN /hpf

## 2022-12-20 MED ORDER — PRAVASTATIN SODIUM 10 MG PO TABS
10.0000 mg | ORAL_TABLET | Freq: Every day | ORAL | 2 refills | Status: DC
Start: 2022-12-20 — End: 2022-12-20

## 2022-12-20 MED ORDER — LISINOPRIL 5 MG PO TABS
5.0000 mg | ORAL_TABLET | Freq: Every day | ORAL | 2 refills | Status: DC
Start: 2022-12-20 — End: 2022-12-21

## 2022-12-20 MED ORDER — PRAVASTATIN SODIUM 10 MG PO TABS
10.0000 mg | ORAL_TABLET | ORAL | 2 refills | Status: DC
Start: 2022-12-20 — End: 2022-12-21

## 2022-12-20 MED ORDER — CEPHALEXIN 500 MG PO CAPS
500.0000 mg | ORAL_CAPSULE | Freq: Two times a day (BID) | ORAL | 0 refills | Status: DC
Start: 2022-12-20 — End: 2022-12-21

## 2022-12-20 MED ORDER — VITAMIN D (ERGOCALCIFEROL) 1.25 MG (50000 UNIT) PO CAPS
50000.0000 [IU] | ORAL_CAPSULE | ORAL | 0 refills | Status: DC
Start: 2022-12-20 — End: 2022-12-21

## 2022-12-20 NOTE — Progress Notes (Signed)
Acute Office Visit  Subjective:  Patient ID: Secret Cradle, female    DOB: Dec 31, 1960, 62 y.o.   MRN: [id number]  Chief Complaint  Patient presents with   Medical Management of Chronic Issues   HPI Patient is in today for management of chronic conditions. She is followed by multiple specialist. She is following with Vascular and Cardiology has follow up in September for follow up US.  Rheumatology - has not been in one year, is still taking rinvoq, no prednisone. Symptoms are well controlled and plans to follow up before the end of the year.   Anxiety  Continues on zoloft. States that stressed about her daughter in law/son. Currently has partial custody of two other grandchildren 67 year old and 33 year old. Has full custody of granddaughter. She is worried about going back to court in 3 weeks for another custody hearing and the financial burden. Does not want to change dose or start counseling at this time.   Hyperlipidemia  Was not able to tolerate previous statins. She is willing to try She has not tried pravastatin and co-Q 10 together.   Hypertension Has BP monitor at home Yes BP at home average 120/65 at home  ROS Denies  fatigue, peripheral edema, changes to vision, chest pain, headaches, palpitations, sweats, SOB, PND, orthopnea Meds Lisinopril  CAD risks hypercholesterolemia/hyperlipidemia, HTN  Endorses anxiety as above   Vitamin D  States that she was on supplementation, but has not been taking any recently  Reports feeling tired all the time.   GERD  States that it is doing well  Denies trouble swallowing, hematochezia, melena   UTI like symptoms  Burning and pain with urination  Denies fever, low back pain, odor, itching, and discharge.  Started one week ago.   Of note, patient has had ~10 tick bites this summer. States that she continues to eat lean chicken and fish. Avoids red meat.   ROS As per HPI   Objective:  BP 124/75   Pulse 85   Temp 98.3 F  (36.8 C)   Ht 5\' 6"  (1.676 m)   Wt 174 lb (78.9 kg)   SpO2 98%   BMI 28.08 kg/m   Physical Exam Constitutional:      General: She is awake. She is not in acute distress.    Appearance: Normal appearance. She is well-developed and well-groomed. She is not ill-appearing, toxic-appearing or diaphoretic.  Cardiovascular:     Rate and Rhythm: Normal rate and regular rhythm.     Pulses: Normal pulses.          Radial pulses are 2+ on the right side and 2+ on the left side.       Posterior tibial pulses are 2+ on the right side and 2+ on the left side.     Heart sounds: Normal heart sounds. No murmur heard.    No gallop.  Pulmonary:     Effort: Pulmonary effort is normal. No respiratory distress.     Breath sounds: Normal breath sounds. No stridor. No wheezing, rhonchi or rales.  Abdominal:     Tenderness: There is no right CVA tenderness or left CVA tenderness.  Musculoskeletal:     Cervical back: Full passive range of motion without pain and neck supple.     Right lower leg: No edema.     Left lower leg: No edema.  Skin:    General: Skin is warm.     Capillary Refill: Capillary refill takes less than  2 seconds.  Neurological:     General: No focal deficit present.     Mental Status: She is alert, oriented to person, place, and time and easily aroused. Mental status is at baseline.     GCS: GCS eye subscore is 4. GCS verbal subscore is 5. GCS motor subscore is 6.     Motor: No weakness.  Psychiatric:        Attention and Perception: Attention and perception normal.        Mood and Affect: Mood and affect normal.        Speech: Speech normal.        Behavior: Behavior normal. Behavior is cooperative.        Thought Content: Thought content normal. Thought content does not include homicidal or suicidal ideation. Thought content does not include homicidal or suicidal plan.        Cognition and Memory: Cognition and memory normal.        Judgment: Judgment normal.         12/20/2022   10:15 AM 10/09/2022   10:04 AM 08/23/2022    1:02 PM  Depression screen PHQ 2/9  Decreased Interest 0 0 0  Down, Depressed, Hopeless 0 0 0  PHQ - 2 Score 0 0 0  Altered sleeping 0 0 1  Tired, decreased energy 1 1 1   Change in appetite 0 0 1  Feeling bad or failure about yourself  0 0 0  Trouble concentrating 0 0 0  Moving slowly or fidgety/restless 0 1 1  Suicidal thoughts 0 0 0  PHQ-9 Score 1 2 4   Difficult doing work/chores Not difficult at all Somewhat difficult Somewhat difficult      12/20/2022   10:16 AM 10/09/2022   10:05 AM 08/23/2022    1:03 PM 07/26/2022    2:25 PM  GAD 7 : Generalized Anxiety Score  Nervous, Anxious, on Edge 0 0 0 0  Control/stop worrying 0 0 0 0  Worry too much - different things 0 0 1 0  Trouble relaxing 0 0 1 0  Restless 0 0 0 0  Easily annoyed or irritable 0 0 0 0  Afraid - awful might happen 0 0 0 0  Total GAD 7 Score 0 0 2 0  Anxiety Difficulty Not difficult at all Not difficult at all Somewhat difficult Not difficult at all   Assessment & Plan:  1. Primary hypertension Well controlled in office today. Refill placed as below. Patient to monitor BP at home and bring measurements to follow up with PCP and Cardiology.  - lisinopril (ZESTRIL) 5 MG tablet; Take 1 tablet (5 mg total) by mouth daily.  Dispense: 30 tablet; Refill: 2  2. Depression, major, single episode, in partial remission (HCC) Reports increased life stressors. Denies SI. Safety contract established. Does not wish to increase dose at this time. Declined referral to counseling.   3. Anxiety Reports increased life stressors. Denies SI. Safety contract established. Does not wish to increase dose at this time. Declined referral to counseling.   4. Mixed hyperlipidemia Patient agreed to try pravastain every other day with Co-Q 10. She did not pick up prior prescription. Will restart as below.  - pravastatin (PRAVACHOL) 10 MG tablet; Take 1 tablet (10 mg total) by mouth  daily.  Dispense: 30 tablet; Refill: 2  5. Vitamin D deficiency Will restart weekly supplementation as below based on labs from Gastroenterology (12/19/22)  - Vitamin D, Ergocalciferol, (DRISDOL) 1.25 MG (50000 UNIT) CAPS  capsule; Take 1 capsule (50,000 Units total) by mouth every 7 (seven) days for 12 doses.  Dispense: 12 capsule; Refill: 0  6. Gastroesophageal reflux disease with esophagitis without hemorrhage Well controlled. Continue current regimen.   7. Rheumatoid arthritis, involving unspecified site, unspecified whether rheumatoid factor present (HCC) Well controlled. Plans to follow up with Rheumatology   8. Allergy to alpha-gal Patient states that she has pulled at least 10 ticks off of her this summer. Reviewed precautions with patient. Denies increase in symptoms.   9. Dysuria Labs as below. Will communicate results to patient once available. Will await results to determine next steps. Will start treatment based on UA and await culture results.  - Urinalysis, Routine w reflex microscopic - Urine Culture - Microscopic Examination - cephALEXin (KEFLEX) 500 MG capsule; Take 1 capsule (500 mg total) by mouth 2 (two) times daily for 7 days.  Dispense: 14 capsule; Refill: 0  The above assessment and management plan was discussed with the patient. The patient verbalized understanding of and has agreed to the management plan using shared-decision making. Patient is aware to call the clinic if they develop any new symptoms or if symptoms fail to improve or worsen. Patient is aware when to return to the clinic for a follow-up visit. Patient educated on when it is appropriate to go to the emergency department.   Return in about 3 months (around 03/22/2023).  Neale Burly, DNP-FNP [facility] [address] [place], [place] [address] [phone]

## 2022-12-21 ENCOUNTER — Telehealth: Payer: Self-pay | Admitting: Family Medicine

## 2022-12-21 MED ORDER — CEPHALEXIN 500 MG PO CAPS: 500.0000 mg | ORAL_CAPSULE | Freq: Two times a day (BID) | ORAL | 0 refills | Status: AC

## 2022-12-21 MED ORDER — VITAMIN D (ERGOCALCIFEROL) 1.25 MG (50000 UNIT) PO CAPS: 50000.0000 [IU] | ORAL_CAPSULE | ORAL | 0 refills | Status: AC

## 2022-12-21 MED ORDER — PRAVASTATIN SODIUM 10 MG PO TABS: 10.0000 mg | ORAL_TABLET | ORAL | 2 refills | Status: DC

## 2022-12-21 MED ORDER — LISINOPRIL 5 MG PO TABS: 5.0000 mg | ORAL_TABLET | Freq: Every day | ORAL | 2 refills | Status: DC

## 2022-12-21 NOTE — Telephone Encounter (Signed)
All scripts failed to go yesterday, resent today RE: Vit D 50,000 u 1 Q week She is unable to take gel caps [facility] has 10,000 u tablets LMOVM & instructed pharmacist, pt can get that strength OTC and take 5 tabs Q week.

## 2022-12-21 NOTE — Progress Notes (Signed)
Refills & scripts failed. resent

## 2022-12-21 NOTE — Addendum Note (Signed)
Addended by: Julious Payer D on: 12/21/2022 09:33 AM   Modules accepted: Orders

## 2022-12-21 NOTE — Telephone Encounter (Signed)
Please resend prescriptions for  cephALEXin (KEFLEX) 500 MG capsule, pravastatin (PRAVACHOL) 10 MG tablet, Vitamin D, Ergocalciferol, (DRISDOL) 1.25 MG (50000 UNIT) CAPS capsule, and lisinopril (ZESTRIL) 5 MG tablet to [facility] in [place].    Patient said to make sure we were aware that she was not able to take gel capsules due to alpha gal.

## 2022-12-21 NOTE — Progress Notes (Signed)
Vitamin D level is low. I have sent in a weekly supplement to take for the next 12 weeks. After that, take a daily OTC vitamin D supplement with 1000-2000 IU. Cholesterol is elevated. Diet encouraged - increase intake of fresh fruits and vegetables, increase intake of lean proteins. Bake, broil, or grill foods. Avoid fried, greasy, and fatty foods. Avoid fast foods. Increase intake of fiber-rich whole grains. Exercise encouraged - at least 150 minutes per week and advance as tolerated.  Remains in intermittent risk category of Cardiac event in the next 10 years.  Will try pravastatin every other day with Co-Q10  The 10-year ASCVD risk score (Arnett DK, et al., 2019) is: 6.6%   Values used to calculate the score:     Age: 62 years     Sex: Female     Is Non-Hispanic African American: No     Diabetic: No     Tobacco smoker: No     Systolic Blood Pressure: 124 mmHg     Is BP treated: Yes     HDL Cholesterol: 40 mg/dL     Total Cholesterol: 226 mg/dL  BUN and Crt slightly low. Some variation in labs is expected. Will continue to monitor. Alk phos slightly elevated. Some variation in labs is expected. Will continue to monitor.   All other labs normal.

## 2022-12-24 ENCOUNTER — Ambulatory Visit: Payer: Medicaid Other | Admitting: Gastroenterology

## 2022-12-24 ENCOUNTER — Encounter: Payer: Self-pay | Admitting: Gastroenterology

## 2022-12-25 DIAGNOSIS — M5459 Other low back pain: Secondary | ICD-10-CM | POA: Diagnosis not present

## 2022-12-25 DIAGNOSIS — M5416 Radiculopathy, lumbar region: Secondary | ICD-10-CM | POA: Diagnosis not present

## 2022-12-25 DIAGNOSIS — G894 Chronic pain syndrome: Secondary | ICD-10-CM | POA: Diagnosis not present

## 2022-12-25 DIAGNOSIS — M4326 Fusion of spine, lumbar region: Secondary | ICD-10-CM | POA: Diagnosis not present

## 2022-12-25 NOTE — Progress Notes (Signed)
Positive UTI. Can continue current antibiotic.

## 2022-12-26 ENCOUNTER — Other Ambulatory Visit: Payer: Self-pay | Admitting: Family Medicine

## 2022-12-26 ENCOUNTER — Telehealth: Payer: Self-pay | Admitting: Family Medicine

## 2022-12-26 ENCOUNTER — Encounter: Payer: Self-pay | Admitting: Family Medicine

## 2022-12-26 DIAGNOSIS — R52 Pain, unspecified: Secondary | ICD-10-CM | POA: Diagnosis not present

## 2022-12-26 DIAGNOSIS — T50905A Adverse effect of unspecified drugs, medicaments and biological substances, initial encounter: Secondary | ICD-10-CM | POA: Diagnosis not present

## 2022-12-26 DIAGNOSIS — Z20822 Contact with and (suspected) exposure to covid-19: Secondary | ICD-10-CM | POA: Diagnosis not present

## 2022-12-26 MED ORDER — FOSFOMYCIN TROMETHAMINE 3 G PO PACK: 3.0000 g | PACK | Freq: Once | ORAL | 0 refills | Status: AC

## 2022-12-27 NOTE — Telephone Encounter (Signed)
Pt has been made aware and understood.  Would like to let Kaitlin Fleming know that her cholesterol medication is causing jt and body pain. Pt states that she has taken three doses. She takes the medication every two days.She also stated that some of her family members were diagnosed with covid. She has tested negative but wondered if her sx's could be from that.  Advised pt that she should give her body more time to adjust to the medication and to be consistent in taking it. Instructed it could take up to two weeks. Pt understood.  Pt will call back if symptoms persist.

## 2023-01-15 ENCOUNTER — Ambulatory Visit: Payer: Medicaid Other | Admitting: Vascular Surgery

## 2023-01-15 ENCOUNTER — Encounter: Payer: Self-pay | Admitting: Vascular Surgery

## 2023-01-15 ENCOUNTER — Ambulatory Visit (HOSPITAL_COMMUNITY)
Admission: RE | Admit: 2023-01-15 | Discharge: 2023-01-15 | Disposition: A | Discharge status: Home / Self Care | Payer: Medicaid Other | Source: Ambulatory Visit | Attending: Vascular Surgery | Admitting: Vascular Surgery

## 2023-01-15 ENCOUNTER — Encounter (HOSPITAL_COMMUNITY): Payer: Medicaid Other

## 2023-01-15 VITALS — BP 144/85 | HR 79 | Temp 97.9°F | Resp 18 | Ht 66.0 in | Wt 173.5 lb

## 2023-01-15 DIAGNOSIS — I771 Stricture of artery: Secondary | ICD-10-CM

## 2023-01-15 NOTE — Progress Notes (Signed)
Patient name: Kaitlin Fleming MRN: [id number] DOB: 1961-01-04 Sex: female  REASON FOR CONSULT: 6 month follow-up, celiac artery occlusive disease  HPI: Kaitlin Fleming is a 62 y.o. 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  She was discharged from the hospital on 07/23/2022 with gastroenteritis.  She had a CT abdomen pelvis with contrast on 07/20/2022 with suspected narrowing of the celiac artery due to atheromatous plaque.  At initial evaluation only complained of SOB with no abdominal symptoms and we elected conservative management.  Today she states that she has gained about 7 pounds since her episode of gastroenteritis.  No food fear.  No significant abdominal pain after eating.  Most of her pain she has is around her pubis.  Past Medical History:  Diagnosis Date   Allergy to alpha-gal    Celiac artery stenosis (HCC)    Chronic back pain    stenosis   Chronic pain    takes Lexapro daily   Complication of anesthesia    pt states after surgery in 2000 had to wear a heart monitor for 3 days.   DDD (degenerative disc disease)    Depression    GERD (gastroesophageal reflux disease)    takes OTC meds if needed   History of blood transfusion    as a child   History of bronchitis 34yrs ago   Hyperlipidemia    takes Fenofibrate daily   Joint pain    Joint swelling    Kidney stone    has one right now on the right side but not giving any problems   Osteoarthritis    Peripheral edema    takes HCTZ daily    Pneumonia 40yrs ago   hx of   PONV (postoperative nausea and vomiting)    Rheumatoid arthritis (HCC)    Rheumatoid arthritis (HCC)    Weakness    and tingling on left side    Past Surgical History:  Procedure Laterality Date   ABDOMINAL HYSTERECTOMY  05/14/1998   BACK SURGERY  03/10/2014   lumbar fusion   BASAL CELL CARCINOMA EXCISION  11/2021   LUMBAR  LAMINECTOMY/DECOMPRESSION MICRODISCECTOMY Left 12/08/2013   Procedure: LEFT LUMBAR FOUR-FIVEW, LUMBAR FIVE-SACRAL ONE LAMINECTOMY;  Surgeon: Karn Cassis, MD;  Location: [facility];  Service: Neurosurgery;  Laterality: Left;   TUBAL LIGATION  24 yrs ago    Family History  Problem Relation Age of Onset   Colon cancer Maternal Grandmother    Colon cancer Paternal Uncle     SOCIAL HISTORY: Social History   Socioeconomic History   Marital status: Married    Spouse name: Not on file   Number of children: Not on file   Years of education: Not on file   Highest education level: Not on file  Occupational History   Not on file  Tobacco Use   Smoking status: Never   Smokeless tobacco: Never  Vaping Use   Vaping status: Never Used  Substance and Sexual Activity   Alcohol use: No   Drug use: No   Sexual activity: Yes    Birth control/protection: Surgical  Other Topics Concern   Not on file  Social History Narrative   Not on file   Social Determinants of Health   Financial Resource Strain: Low Risk  (09/11/2022)   Received from [facility], [facility]   Overall Financial Resource Strain (CARDIA)    Difficulty of Paying Living Expenses:  Not hard at all  Food Insecurity: No Food Insecurity (09/11/2022)   Received from [facility], [facility]   Hunger Vital Sign    Worried About Running Out of Food in the Last Year: Never true    Ran Out of Food in the Last Year: Never true  Transportation Needs: No Transportation Needs (09/11/2022)   Received from [facility], [facility]   [facility] - Transportation    Lack of Transportation (Medical): No    Lack of Transportation (Non-Medical): No  Physical Activity: Not on file  Stress: Not on file  Social Connections: Unknown (09/26/2021)   Received from [facility], [facility]   Social Network    Social Network: Not on file  Intimate Partner Violence: Not At Risk (07/21/2022)   Humiliation, Afraid, Rape, and Kick  questionnaire    Fear of Current or Ex-Partner: No    Emotionally Abused: No    Physically Abused: No    Sexually Abused: No    Allergies  Allergen Reactions   Alpha-Gal Anaphylaxis, Swelling, Hives and Other (See Comments)    Serology-proven Serology-proven    Macrobid [Nitrofurantoin] Shortness Of Breath and Rash   Pravastatin Other (See Comments) and Nausea And Vomiting    Rash & heart racing. Rash & heart racing. Rash & heart racing.   Macrolides And Ketolides    Methotrexate    Prevacid [Lansoprazole] Other (See Comments)    Started foaming at the mouth   Remicade [Infliximab]    Tofacitinib Other (See Comments)    Upper respiratory infections   Ibuprofen Hives   Keflex [Cephalexin] Rash   Penicillins Other (See Comments)    Breaks out Has patient had a PCN reaction causing immediate rash, facial/tongue/throat swelling, SOB or lightheadedness with hypotension: yes Has patient had a PCN reaction causing severe rash involving mucus membranes or skin necrosis: no Has patient had a PCN reaction that required hospitalization: no Has patient had a PCN reaction occurring within the last 10 years:no If all of the above answers are "NO", then may proceed with Cephalosporin use.   Sulfa Antibiotics Other (See Comments)    Breaks out    Current Outpatient Medications  Medication Sig Dispense Refill   aspirin EC 81 MG tablet Take 81 mg by mouth daily. Swallow whole.     azelastine (ASTELIN) 0.1 % nasal spray Place 2 sprays into both nostrils 2 (two) times daily. Use in each nostril as directed 30 mL 3   cyclobenzaprine (FLEXERIL) 5 MG tablet Take 5 mg by mouth at bedtime as needed.     EPINEPHrine 0.3 mg/0.3 mL IJ SOAJ injection Inject 0.3 mg into the muscle as needed for anaphylaxis. 2 each 1   fluticasone (FLONASE) 50 MCG/ACT nasal spray Place 2 sprays into both nostrils daily. 16 g 3   HYDROcodone-acetaminophen (NORCO) 10-325 MG tablet Take by mouth.     lisinopril (ZESTRIL)  5 MG tablet Take 1 tablet (5 mg total) by mouth daily. 30 tablet 2   loratadine (CLARITIN) 10 MG tablet Take 1 tablet (10 mg total) by mouth at bedtime. 30 tablet 1   pantoprazole (PROTONIX) 40 MG tablet TAKE 1 TABLET BY MOUTH TWICE DAILY BEFORE MEALS 60 tablet 1   potassium chloride SA (KLOR-CON M) 20 MEQ tablet Take 1 tablet (20 mEq total) by mouth daily. 30 tablet 0   pravastatin (PRAVACHOL) 10 MG tablet Take 1 tablet (10 mg total) by mouth every other day. 30 tablet 2   sertraline (ZOLOFT) 50  MG tablet Take 1 tablet (50 mg total) by mouth daily. 90 tablet 3   triamcinolone cream (KENALOG) 0.1 % Apply topically.     Upadacitinib ER (RINVOQ) 15 MG TB24 Take 1 tablet by mouth daily.     Vitamin D, Ergocalciferol, (DRISDOL) 1.25 MG (50000 UNIT) CAPS capsule Take 1 capsule (50,000 Units total) by mouth every 7 (seven) days for 12 doses. 12 capsule 0   ZTLIDO 1.8 % PTCH Apply topically.     No current facility-administered medications for this visit.    REVIEW OF SYSTEMS:  [X]  denotes positive finding, [ ]  denotes negative finding Cardiac  Comments:  Chest pain or chest pressure:    Shortness of breath upon exertion:    Short of breath when lying flat:    Irregular heart rhythm:        Vascular    Pain in calf, thigh, or hip brought on by ambulation:    Pain in feet at night that wakes you up from your sleep:     Blood clot in your veins:    Leg swelling:         Pulmonary    Oxygen at home:    Productive cough:     Wheezing:     Shortness of breath    Neurologic    Sudden weakness in arms or legs:     Sudden numbness in arms or legs:     Sudden onset of difficulty speaking or slurred speech:    Temporary loss of vision in one eye:     Problems with dizziness:         Gastrointestinal    Blood in stool:     Vomited blood:         Genitourinary    Burning when urinating:     Blood in urine:        Psychiatric    Major depression:         Hematologic    Bleeding  problems:    Problems with blood clotting too easily:        Skin    Rashes or ulcers:        Constitutional    Fever or chills:      PHYSICAL EXAM: There were no vitals filed for this visit.   GENERAL: The patient is a well-nourished female, in no acute distress. The vital signs are documented above. CARDIAC: There is a regular rate and rhythm.  VASCULAR:  Bilateral femoral pulses palpable Bilateral PT pulses palpable PULMONARY: No respiratory distress. ABDOMEN: Soft and non-tender. MUSCULOSKELETAL: There are no major deformities or cyanosis. NEUROLOGIC: No focal weakness or paresthesias are detected. SKIN: There are no ulcers or rashes noted. PSYCHIATRIC: The patient has a normal affect.  DATA:   Mesenteric duplex today: High-grade stenosis in the celiac artery over 70% with no flow-limiting stenosis in the SMA  CT reviewed from 07/20/2022 and this is not a dedicated CTA but there does appear to be some atheromatous plaque in the celiac artery with a widely patent SMA  Assessment/Plan:  62 year old female presents for 6 month follow-up of suspected celiac artery narrowing due to atheromatous plaque noted on CT abdomen pelvis from 07/20/2022 when she was admitted with gastroenteritis.  Again discussed this was an incidental finding.  She has no indication for intervention as she does not have any symptoms of chronic mesenteric ischemia on 6 month follow-up today.  Her duplex today does confirm a greater than 70% celiac artery stenosis  but no significant SMA flow-limiting stenosis.  In the absence of symptoms would not intervene as we discussed.  Will follow with repeat mesenteric duplex in 1 year.  She is on aspirin for risk reduction and trying a new statin.      Cephus Shelling, MD [facility] Office: [phone]

## 2023-01-21 DIAGNOSIS — M47819 Spondylosis without myelopathy or radiculopathy, site unspecified: Secondary | ICD-10-CM | POA: Diagnosis not present

## 2023-01-22 DIAGNOSIS — G894 Chronic pain syndrome: Secondary | ICD-10-CM | POA: Diagnosis not present

## 2023-01-22 DIAGNOSIS — M5459 Other low back pain: Secondary | ICD-10-CM | POA: Diagnosis not present

## 2023-01-22 DIAGNOSIS — M5416 Radiculopathy, lumbar region: Secondary | ICD-10-CM | POA: Diagnosis not present

## 2023-01-22 DIAGNOSIS — Z79891 Long term (current) use of opiate analgesic: Secondary | ICD-10-CM | POA: Diagnosis not present

## 2023-01-22 DIAGNOSIS — M4326 Fusion of spine, lumbar region: Secondary | ICD-10-CM | POA: Diagnosis not present

## 2023-01-22 DIAGNOSIS — Z79899 Other long term (current) drug therapy: Secondary | ICD-10-CM | POA: Diagnosis not present

## 2023-01-29 ENCOUNTER — Other Ambulatory Visit: Payer: Self-pay

## 2023-01-29 DIAGNOSIS — I771 Stricture of artery: Secondary | ICD-10-CM

## 2023-03-11 ENCOUNTER — Telehealth: Payer: Self-pay

## 2023-03-11 NOTE — Telephone Encounter (Signed)
  Medicaid Managed Care   Unsuccessful Outreach Note  03/11/2023 Name: Kaitlin Fleming MRN: [id number] DOB: [birth year]  Referred by: Arrie Senate, FNP Reason for referral : No chief complaint on file.   An unsuccessful telephone outreach was attempted today. The patient was referred to the case management team for assistance with care management and care coordination.   Follow Up Plan: If patient returns call to provider office, please advise to call Embedded Care Management Care Guide Nicholes Rough* at [phone]*  Nicholes Rough, CMA Care Guide [facility]

## 2023-03-26 ENCOUNTER — Ambulatory Visit: Payer: Medicaid Other | Admitting: Family Medicine

## 2023-03-29 ENCOUNTER — Other Ambulatory Visit: Payer: Self-pay | Admitting: Gastroenterology

## 2023-03-29 DIAGNOSIS — K2100 Gastro-esophageal reflux disease with esophagitis, without bleeding: Secondary | ICD-10-CM

## 2023-04-02 ENCOUNTER — Encounter: Payer: Self-pay | Admitting: Family Medicine

## 2023-04-02 ENCOUNTER — Ambulatory Visit: Payer: Medicaid Other | Admitting: Family Medicine

## 2023-04-02 VITALS — BP 129/73 | HR 83 | Temp 98.0°F | Ht 66.0 in | Wt 176.0 lb

## 2023-04-02 DIAGNOSIS — B001 Herpesviral vesicular dermatitis: Secondary | ICD-10-CM

## 2023-04-02 DIAGNOSIS — K2100 Gastro-esophageal reflux disease with esophagitis, without bleeding: Secondary | ICD-10-CM | POA: Diagnosis not present

## 2023-04-02 DIAGNOSIS — E876 Hypokalemia: Secondary | ICD-10-CM | POA: Diagnosis not present

## 2023-04-02 DIAGNOSIS — E559 Vitamin D deficiency, unspecified: Secondary | ICD-10-CM

## 2023-04-02 DIAGNOSIS — E782 Mixed hyperlipidemia: Secondary | ICD-10-CM | POA: Diagnosis not present

## 2023-04-02 DIAGNOSIS — I10 Essential (primary) hypertension: Secondary | ICD-10-CM | POA: Diagnosis not present

## 2023-04-02 DIAGNOSIS — R2242 Localized swelling, mass and lump, left lower limb: Secondary | ICD-10-CM | POA: Diagnosis not present

## 2023-04-02 DIAGNOSIS — F324 Major depressive disorder, single episode, in partial remission: Secondary | ICD-10-CM

## 2023-04-02 DIAGNOSIS — F419 Anxiety disorder, unspecified: Secondary | ICD-10-CM | POA: Diagnosis not present

## 2023-04-02 MED ORDER — VALACYCLOVIR HCL 1 G PO TABS: 1000.0000 mg | ORAL_TABLET | Freq: Two times a day (BID) | ORAL | 0 refills | Status: DC

## 2023-04-02 MED ORDER — CHOLESTYRAMINE 4 G PO PACK: 4.0000 g | PACK | Freq: Every day | ORAL | 12 refills | Status: DC

## 2023-04-02 MED ORDER — BUSPIRONE HCL 5 MG PO TABS
5.0000 mg | ORAL_TABLET | Freq: Two times a day (BID) | ORAL | 0 refills | Status: DC
Start: 2023-04-02 — End: 2023-04-03

## 2023-04-02 NOTE — Progress Notes (Signed)
Subjective:  Patient ID: Kaitlin Fleming, female    DOB: 12-Mar-1961, 62 y.o.   MRN: [id number]  Patient Care Team: Arrie Senate, FNP as PCP - General (Family Medicine) Lanelle Bal, DO as Consulting Physician (Internal Medicine)   Chief Complaint:  Medical Management of Chronic Issues   HPI: Kaitlin Fleming is a 62 y.o. female presenting on 04/02/2023 for Medical Management of Chronic Issues  1. Gastroesophageal reflux disease with esophagitis without hemorrhage States that it is "not good" she is on PPI. States that she gets choked often with food. She believes it is due to history of Alpha gal. States that she does not have hematochezia or hematemesis. She states that she is established with GI in [place], but has not made follow up appt. She was recommended to receive EGD with colonoscopy, but she has not followed up. She has family history of colon cancer and has never received colonoscopy.   2. Depression, major, single episode, in partial remission (HCC)/3. Anxiety States that she has been on zoloft for 20 years. She reports that she is very stressed with her son and caring for her parents. Feels that she is not able to "keep her cool" like she normally does. States that she has more outbursts against her husband. States that she does not have the time to complete therapy. She states that she has lack of motivation and desire to do things that she normally would do.   4. Vitamin D deficiency States that she has not been taking a supplement. Endorses cramping and fatigue. Bilateral lower extremity cramping.   5. Mixed hyperlipidemia Reports that she is unable to tolerated pravastatin. She would like to switch back to cholestyramine. States that this is the only thing that she can tolerate.   6. Primary hypertension Has BP monitor at home Yes BP at home average did not bring log with her to appt today.  ROS Denies peripheral edema, changes to vision, chest  pain, headaches, palpitations, sweats, SOB, PND, orthopnea Endorses fatigue and anxiety symptoms mainly related to family stressors  Meds Lisinopril 5 mg   CAD risks hypertension, hypercholesterolemia/hyperlipidemia  7. Nodule of skin of left lower leg States that she noticed a nodule on her skin a few weeks ago. Does not know if it is growing. Tender to touch. Denies shortness of breath, intense pain at the site.   8. Hypokalemia States that she has required supplementation in the past and would like to be rechecked as she has been having cramping in her legs the last few nights.   9. Fever blister States that she has had increased fever blisters this year. Does not have one at this time, but would like a refill on valtrex for her next flare.    Relevant past medical, surgical, family, and social history reviewed and updated as indicated.  Allergies and medications reviewed and updated. Data reviewed: Chart in Epic.   Past Medical History:  Diagnosis Date   Allergy to alpha-gal    Celiac artery stenosis (HCC)    Chronic back pain    stenosis   Chronic pain    takes Lexapro daily   Complication of anesthesia    pt states after surgery in 2000 had to wear a heart monitor for 3 days.   DDD (degenerative disc disease)    Depression    GERD (gastroesophageal reflux disease)    takes OTC meds if needed   History of blood transfusion    as  a child   History of bronchitis 75yrs ago   Hyperlipidemia    takes Fenofibrate daily   Joint pain    Joint swelling    Kidney stone    has one right now on the right side but not giving any problems   Osteoarthritis    Peripheral edema    takes HCTZ daily    Pneumonia 81yrs ago   hx of   PONV (postoperative nausea and vomiting)    Rheumatoid arthritis (HCC)    Rheumatoid arthritis (HCC)    Weakness    and tingling on left side    Past Surgical History:  Procedure Laterality Date   ABDOMINAL HYSTERECTOMY  05/14/1998   BACK SURGERY   03/10/2014   lumbar fusion   BASAL CELL CARCINOMA EXCISION  11/2021   LUMBAR LAMINECTOMY/DECOMPRESSION MICRODISCECTOMY Left 12/08/2013   Procedure: LEFT LUMBAR FOUR-FIVEW, LUMBAR FIVE-SACRAL ONE LAMINECTOMY;  Surgeon: Karn Cassis, MD;  Location: [facility];  Service: Neurosurgery;  Laterality: Left;   TUBAL LIGATION  24 yrs ago    Social History   Socioeconomic History   Marital status: Married    Spouse name: Not on file   Number of children: Not on file   Years of education: Not on file   Highest education level: Not on file  Occupational History   Not on file  Tobacco Use   Smoking status: Never   Smokeless tobacco: Never  Vaping Use   Vaping status: Never Used  Substance and Sexual Activity   Alcohol use: No   Drug use: No   Sexual activity: Yes    Birth control/protection: Surgical  Other Topics Concern   Not on file  Social History Narrative   Not on file   Social Determinants of Health   Financial Resource Strain: Low Risk  (09/11/2022)   Received from [facility], [facility]   Overall Financial Resource Strain (CARDIA)    Difficulty of Paying Living Expenses: Not hard at all  Food Insecurity: No Food Insecurity (09/11/2022)   Received from [facility], [facility]   Hunger Vital Sign    Worried About Running Out of Food in the Last Year: Never true    Ran Out of Food in the Last Year: Never true  Transportation Needs: No Transportation Needs (09/11/2022)   Received from [employer], [facility]   PRAPARE - Transportation    Lack of Transportation (Medical): No    Lack of Transportation (Non-Medical): No  Physical Activity: Not on file  Stress: Not on file  Social Connections: Unknown (09/26/2021)   Received from [facility], [facility]   Social Network    Social Network: Not on file  Intimate Partner Violence: Not At Risk (07/21/2022)   Humiliation, Afraid, Rape, and Kick questionnaire    Fear of Current or Ex-Partner: No     Emotionally Abused: No    Physically Abused: No    Sexually Abused: No    Outpatient Encounter Medications as of 04/02/2023  Medication Sig   oxyCODONE-acetaminophen (PERCOCET) 10-325 MG tablet Take 1 tablet by mouth 4 (four) times daily as needed.   aspirin EC 81 MG tablet Take 81 mg by mouth daily. Swallow whole.   azelastine (ASTELIN) 0.1 % nasal spray Place 2 sprays into both nostrils 2 (two) times daily. Use in each nostril as directed (Patient not taking: Reported on 01/15/2023)   cyclobenzaprine (FLEXERIL) 5 MG tablet Take 5 mg by mouth at bedtime as needed.   EPINEPHrine  0.3 mg/0.3 mL IJ SOAJ injection Inject 0.3 mg into the muscle as needed for anaphylaxis.   fluticasone (FLONASE) 50 MCG/ACT nasal spray Place 2 sprays into both nostrils daily. (Patient not taking: Reported on 01/15/2023)   lisinopril (ZESTRIL) 5 MG tablet Take 1 tablet (5 mg total) by mouth daily.   loratadine (CLARITIN) 10 MG tablet Take 1 tablet (10 mg total) by mouth at bedtime.   pantoprazole (PROTONIX) 40 MG tablet TAKE 1 TABLET BY MOUTH TWICE DAILY BEFORE MEALS   potassium chloride SA (KLOR-CON M) 20 MEQ tablet Take 1 tablet (20 mEq total) by mouth daily.   pravastatin (PRAVACHOL) 10 MG tablet Take 1 tablet (10 mg total) by mouth every other day. (Patient not taking: Reported on 01/15/2023)   sertraline (ZOLOFT) 50 MG tablet Take 1 tablet (50 mg total) by mouth daily.   triamcinolone cream (KENALOG) 0.1 % Apply topically.   Upadacitinib ER (RINVOQ) 15 MG TB24 Take 1 tablet by mouth daily.   ZTLIDO 1.8 % PTCH Apply topically.   [DISCONTINUED] HYDROcodone-acetaminophen (NORCO) 10-325 MG tablet Take by mouth.   No facility-administered encounter medications on file as of 04/02/2023.    Allergies  Allergen Reactions   Alpha-Gal Anaphylaxis, Swelling, Hives and Other (See Comments)    Serology-proven Serology-proven    Macrobid [Nitrofurantoin] Shortness Of Breath and Rash   Pravastatin Other (See Comments) and  Nausea And Vomiting    Rash & heart racing. Rash & heart racing. Rash & heart racing.   Macrolides And Ketolides    Methotrexate    Prevacid [Lansoprazole] Other (See Comments)    Started foaming at the mouth   Remicade [Infliximab]    Tofacitinib Other (See Comments)    Upper respiratory infections   Ibuprofen Hives   Keflex [Cephalexin] Rash   Penicillins Other (See Comments)    Breaks out Has patient had a PCN reaction causing immediate rash, facial/tongue/throat swelling, SOB or lightheadedness with hypotension: yes Has patient had a PCN reaction causing severe rash involving mucus membranes or skin necrosis: no Has patient had a PCN reaction that required hospitalization: no Has patient had a PCN reaction occurring within the last 10 years:no If all of the above answers are "NO", then may proceed with Cephalosporin use.   Sulfa Antibiotics Other (See Comments)    Breaks out    Review of Systems As per HPI  Objective:  BP 129/73   Pulse 83   Temp 98 F (36.7 C)   Ht 5\' 6"  (1.676 m)   Wt 176 lb (79.8 kg)   SpO2 97%   BMI 28.41 kg/m    Wt Readings from Last 3 Encounters:  04/02/23 176 lb (79.8 kg)  01/15/23 173 lb 8 oz (78.7 kg)  12/20/22 174 lb (78.9 kg)   Physical Exam Constitutional:      General: She is awake. She is not in acute distress.    Appearance: Normal appearance. She is well-developed and well-groomed. She is not ill-appearing, toxic-appearing or diaphoretic.  Cardiovascular:     Rate and Rhythm: Normal rate.     Pulses: Normal pulses.          Radial pulses are 2+ on the right side and 2+ on the left side.       Posterior tibial pulses are 2+ on the right side and 2+ on the left side.     Heart sounds: Normal heart sounds. No murmur heard.    No gallop.  Pulmonary:     Effort:  Pulmonary effort is normal. No respiratory distress.     Breath sounds: Normal breath sounds. No stridor. No wheezing, rhonchi or rales.  Musculoskeletal:     Cervical  back: Full passive range of motion without pain and neck supple.     Right lower leg: No edema.     Left lower leg: No edema.  Skin:    General: Skin is warm.     Capillary Refill: Capillary refill takes less than 2 seconds.  Neurological:     General: No focal deficit present.     Mental Status: She is alert, oriented to person, place, and time and easily aroused. Mental status is at baseline.     GCS: GCS eye subscore is 4. GCS verbal subscore is 5. GCS motor subscore is 6.     Motor: No weakness.  Psychiatric:        Attention and Perception: Attention and perception normal.        Mood and Affect: Mood and affect normal.        Speech: Speech normal.        Behavior: Behavior normal. Behavior is cooperative.        Thought Content: Thought content normal. Thought content does not include homicidal or suicidal ideation. Thought content does not include homicidal or suicidal plan.        Cognition and Memory: Cognition and memory normal.        Judgment: Judgment normal.      Results for orders placed or performed in visit on 12/20/22  Urine Culture   Specimen: Urine   UR  Result Value Ref Range   Urine Culture, Routine Final report (A)    Organism ID, Bacteria Klebsiella pneumoniae (A)    Antimicrobial Susceptibility Comment   Microscopic Examination   Urine  Result Value Ref Range   WBC, UA 11-30 (A) 0 - 5 /hpf   RBC, Urine 0-2 0 - 2 /hpf   Epithelial Cells (non renal) 0-10 0 - 10 /hpf   Renal Epithel, UA None seen None seen /hpf   Bacteria, UA Many (A) None seen/Few  Urinalysis, Routine w reflex microscopic  Result Value Ref Range   Specific Gravity, UA <1.005 (L) 1.005 - 1.030   pH, UA 5.5 5.0 - 7.5   Color, UA Yellow Yellow   Appearance Ur Clear Clear   Leukocytes,UA 2+ (A) Negative   Protein,UA Negative Negative/Trace   Glucose, UA Negative Negative   Ketones, UA Negative Negative   RBC, UA Trace (A) Negative   Bilirubin, UA Negative Negative   Urobilinogen,  Ur 0.2 0.2 - 1.0 mg/dL   Nitrite, UA Positive (A) Negative   Microscopic Examination See below:        04/02/2023   11:08 AM 12/20/2022   10:15 AM 10/09/2022   10:04 AM 08/23/2022    1:02 PM 07/26/2022    2:25 PM  Depression screen PHQ 2/9  Decreased Interest 0 0 0 0 0  Down, Depressed, Hopeless 0 0 0 0 0  PHQ - 2 Score 0 0 0 0 0  Altered sleeping 0 0 0 1 1  Tired, decreased energy 2 1 1 1 1   Change in appetite 0 0 0 1 1  Feeling bad or failure about yourself  0 0 0 0 0  Trouble concentrating 0 0 0 0 0  Moving slowly or fidgety/restless 1 0 1 1 1   Suicidal thoughts 0 0 0 0 0  PHQ-9 Score 3 1 2  4 4  Difficult doing work/chores Very difficult Not difficult at all Somewhat difficult Somewhat difficult        04/02/2023   11:08 AM 12/20/2022   10:16 AM 10/09/2022   10:05 AM 08/23/2022    1:03 PM  GAD 7 : Generalized Anxiety Score  Nervous, Anxious, on Edge 0 0 0 0  Control/stop worrying 1 0 0 0  Worry too much - different things 1 0 0 1  Trouble relaxing 0 0 0 1  Restless 0 0 0 0  Easily annoyed or irritable 1 0 0 0  Afraid - awful might happen 0 0 0 0  Total GAD 7 Score 3 0 0 2  Anxiety Difficulty Somewhat difficult Not difficult at all Not difficult at all Somewhat difficult    Pertinent labs & imaging results that were available during my care of the patient were reviewed by me and considered in my medical decision making.  Assessment & Plan:  Leeanne was seen today for medical management of chronic issues.  Diagnoses and all orders for this visit:  Gastroesophageal reflux disease with esophagitis without hemorrhage Not well controlled. Reviewed notes from [place], [place] on 01/27/21. Recommended patient follow up with GI for recommended imaging/procedures.   Depression, major, single episode, in partial remission (HCC) Not at goal. Declines referral to counseling at this time. Denies SI. Will start medication as below. Patient to follow up in 3 months to monitor effects.  -      busPIRone (BUSPAR) 5 MG tablet; Take 1 tablet (5 mg total) by mouth 2 (two) times daily.  Anxiety As above.  -     busPIRone (BUSPAR) 5 MG tablet; Take 1 tablet (5 mg total) by mouth 2 (two) times daily.  Vitamin D deficiency Labs as below. Will communicate results to patient once available. Will await results to determine next steps.  -     VITAMIN D 25 Hydroxy (Vit-D Deficiency, Fractures) -     Magnesium  Mixed hyperlipidemia Not at goal. Patient unable to tolerate statin or zetia. Will restart medication as below.  -     cholestyramine (QUESTRAN) 4 g packet; Take 1 packet (4 g total) by mouth daily.  Primary hypertension Well controlled. Patient to continue current regimen. Labs as below. Will communicate results to patient once available. Will await results to determine next steps.  -     CBC with Differential/Platelet -     CMP14+EGFR  Nodule of skin of left lower leg Imaging as below. Will communicate results to patient once available. Will await results to determine next steps. -     US Venous Img Lower Unilateral Left; Future  Hypokalemia Labs as below. Will communicate results to patient once available. Will await results to determine next steps.  -     CMP14+EGFR -     Magnesium  Fever blister Refill provided. Reviewed instructions with patient to start as soon as flare starts especially since patient is immunocompromised.  -     valACYclovir (VALTREX) 1000 MG tablet; Take 1 tablet (1,000 mg total) by mouth 2 (two) times daily. Continue all other maintenance medications.  Follow up plan: Return in about 3 months (around 07/03/2023).   Continue healthy lifestyle choices, including diet (rich in fruits, vegetables, and lean proteins, and low in salt and simple carbohydrates) and exercise (at least 30 minutes of moderate physical activity daily).  Written and verbal instructions provided   The above assessment and management plan was discussed with the  patient. The  patient verbalized understanding of and has agreed to the management plan. Patient is aware to call the clinic if they develop any new symptoms or if symptoms persist or worsen. Patient is aware when to return to the clinic for a follow-up visit. Patient educated on when it is appropriate to go to the emergency department.   Neale Burly, DNP-FNP [facility] [address] [place], [place] [address] [phone]

## 2023-04-03 ENCOUNTER — Other Ambulatory Visit: Payer: Self-pay | Admitting: Family Medicine

## 2023-04-03 DIAGNOSIS — F419 Anxiety disorder, unspecified: Secondary | ICD-10-CM

## 2023-04-03 DIAGNOSIS — F324 Major depressive disorder, single episode, in partial remission: Secondary | ICD-10-CM

## 2023-04-03 LAB — CMP14+EGFR
ALT: 11 [IU]/L (ref 0–32)
AST: 15 [IU]/L (ref 0–40)
Albumin: 4.2 g/dL (ref 3.9–4.9)
Alkaline Phosphatase: 129 [IU]/L — ABNORMAL HIGH (ref 44–121)
BUN/Creatinine Ratio: 12 (ref 12–28)
BUN: 8 mg/dL (ref 8–27)
Bilirubin Total: 0.4 mg/dL (ref 0.0–1.2)
CO2: 25 mmol/L (ref 20–29)
Calcium: 8.9 mg/dL (ref 8.7–10.3)
Chloride: 101 mmol/L (ref 96–106)
Creatinine, Ser: 0.66 mg/dL (ref 0.57–1.00)
Globulin, Total: 2.7 g/dL (ref 1.5–4.5)
Glucose: 85 mg/dL (ref 70–99)
Potassium: 4.1 mmol/L (ref 3.5–5.2)
Sodium: 141 mmol/L (ref 134–144)
Total Protein: 6.9 g/dL (ref 6.0–8.5)
eGFR: 99 mL/min/{1.73_m2} (ref >59)

## 2023-04-03 LAB — CBC WITH DIFFERENTIAL/PLATELET
Basophils Absolute: 0 10*3/uL (ref 0.0–0.2)
Basos: 0 %
EOS (ABSOLUTE): 0.1 10*3/uL (ref 0.0–0.4)
Eos: 2 %
Hematocrit: 35.1 % (ref 34.0–46.6)
Hemoglobin: 11.7 g/dL (ref 11.1–15.9)
Immature Grans (Abs): 0 10*3/uL (ref 0.0–0.1)
Immature Granulocytes: 0 %
Lymphocytes Absolute: 0.8 10*3/uL (ref 0.7–3.1)
Lymphs: 14 %
MCH: 29.8 pg (ref 26.6–33.0)
MCHC: 33.3 g/dL (ref 31.5–35.7)
MCV: 89 fL (ref 79–97)
Monocytes Absolute: 0.6 10*3/uL (ref 0.1–0.9)
Monocytes: 11 %
Neutrophils Absolute: 4.3 10*3/uL (ref 1.4–7.0)
Neutrophils: 73 %
Platelets: 252 10*3/uL (ref 150–450)
RBC: 3.93 x10E6/uL (ref 3.77–5.28)
RDW: 13.4 % (ref 11.7–15.4)
WBC: 5.9 10*3/uL (ref 3.4–10.8)

## 2023-04-03 LAB — MAGNESIUM: Magnesium: 1.9 mg/dL (ref 1.6–2.3)

## 2023-04-03 LAB — VITAMIN D 25 HYDROXY (VIT D DEFICIENCY, FRACTURES): Vit D, 25-Hydroxy: 20.5 ng/mL — ABNORMAL LOW (ref 30.0–100.0)

## 2023-04-03 MED ORDER — VITAMIN D (ERGOCALCIFEROL) 1.25 MG (50000 UNIT) PO CAPS: 50000.0000 [IU] | ORAL_CAPSULE | ORAL | 0 refills | Status: AC

## 2023-04-03 NOTE — Progress Notes (Signed)
Vitamin D level is low. I have sent in a weekly supplement to take for the next 12 weeks. After that, take a daily OTC vitamin D supplement with 1000-2000 international units. Slightly elevated Alk phos, most likely due to nonfasting status. However, pt has a history of fatty liver. Therefore, recommend diet and exercise. Recommend avoiding liver toxins such as alcohol and medications eliminated by the liver like tylenol.

## 2023-04-03 NOTE — Addendum Note (Signed)
Addended by: Neale Burly on: 04/03/2023 08:14 AM   Modules accepted: Orders

## 2023-04-04 ENCOUNTER — Encounter (HOSPITAL_COMMUNITY): Payer: Self-pay

## 2023-04-04 ENCOUNTER — Other Ambulatory Visit: Payer: Self-pay

## 2023-04-04 ENCOUNTER — Emergency Department (HOSPITAL_COMMUNITY): Payer: Medicaid Other

## 2023-04-04 ENCOUNTER — Emergency Department (HOSPITAL_COMMUNITY)
Admission: EM | Admit: 2023-04-04 | Discharge: 2023-04-04 | Disposition: A | Discharge status: Home / Self Care | Payer: Medicaid Other | Attending: Emergency Medicine | Admitting: Emergency Medicine

## 2023-04-04 DIAGNOSIS — Z7982 Long term (current) use of aspirin: Secondary | ICD-10-CM | POA: Insufficient documentation

## 2023-04-04 DIAGNOSIS — M79662 Pain in left lower leg: Secondary | ICD-10-CM | POA: Diagnosis not present

## 2023-04-04 DIAGNOSIS — M79605 Pain in left leg: Secondary | ICD-10-CM

## 2023-04-04 LAB — COMPREHENSIVE METABOLIC PANEL
ALT: 16 U/L (ref 0–44)
AST: 17 U/L (ref 15–41)
Albumin: 3.7 g/dL (ref 3.5–5.0)
Alkaline Phosphatase: 100 U/L (ref 38–126)
Anion gap: 11 (ref 5–15)
BUN: 7 mg/dL — ABNORMAL LOW (ref 8–23)
CO2: 27 mmol/L (ref 22–32)
Calcium: 8.8 mg/dL — ABNORMAL LOW (ref 8.9–10.3)
Chloride: 100 mmol/L (ref 98–111)
Creatinine, Ser: 0.56 mg/dL (ref 0.44–1.00)
GFR, Estimated: >60 mL/min (ref >60)
Glucose, Bld: 88 mg/dL (ref 70–99)
Potassium: 3.7 mmol/L (ref 3.5–5.1)
Sodium: 138 mmol/L (ref 135–145)
Total Bilirubin: 0.7 mg/dL (ref <1.2)
Total Protein: 7.4 g/dL (ref 6.5–8.1)

## 2023-04-04 LAB — CBC WITH DIFFERENTIAL/PLATELET
Abs Immature Granulocytes: 0.01 10*3/uL (ref 0.00–0.07)
Basophils Absolute: 0 10*3/uL (ref 0.0–0.1)
Basophils Relative: 0 %
Eosinophils Absolute: 0.1 10*3/uL (ref 0.0–0.5)
Eosinophils Relative: 2 %
HCT: 35.4 % — ABNORMAL LOW (ref 36.0–46.0)
Hemoglobin: 11.4 g/dL — ABNORMAL LOW (ref 12.0–15.0)
Immature Granulocytes: 0 %
Lymphocytes Relative: 15 %
Lymphs Abs: 0.8 10*3/uL (ref 0.7–4.0)
MCH: 29.5 pg (ref 26.0–34.0)
MCHC: 32.2 g/dL (ref 30.0–36.0)
MCV: 91.5 fL (ref 80.0–100.0)
Monocytes Absolute: 0.5 10*3/uL (ref 0.1–1.0)
Monocytes Relative: 10 %
Neutro Abs: 4 10*3/uL (ref 1.7–7.7)
Neutrophils Relative %: 73 %
Platelets: 233 10*3/uL (ref 150–400)
RBC: 3.87 MIL/uL (ref 3.87–5.11)
RDW: 13.6 % (ref 11.5–15.5)
WBC: 5.5 10*3/uL (ref 4.0–10.5)
nRBC: 0 % (ref 0.0–0.2)

## 2023-04-04 NOTE — ED Triage Notes (Signed)
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

## 2023-04-04 NOTE — Discharge Instructions (Signed)
As we discussed, no evidence of blood clot today.  This is great news.  I would like for you to follow-up with your primary care doctor for further evaluation.  You may return to the emergency department for any worsening symptoms.

## 2023-04-04 NOTE — ED Provider Notes (Signed)
[place] EMERGENCY DEPARTMENT AT [facility] Provider Note   CSN: [id number] Arrival date & time: 04/04/23  1031     History Chief Complaint  Patient presents with   Leg Pain    Kaitlin Fleming is a 62 y.o. female with history of RA who presents to the emergency department today for further evaluation of left lower leg pain.  Chart review does reveal that the patient was seen evaluated here back in July for similar symptoms.  She had an ultrasound which was normal.  Patient mentioned that she left prior to getting the results due to wait time.  She notes that she had a elevated knot in the left calf that has since been going down but she is having diffuse left leg pain.  She saw her PCP yesterday had some blood work which was normal.  Her PCP scheduled an ultrasound which she has not yet gotten.  She came here for further evaluation.  She denies any chest pain, shortness of breath, fever, chills.   Leg Pain      Home Medications Prior to Admission medications   Medication Sig Start Date End Date Taking? Authorizing Provider  aspirin EC 81 MG tablet Take 81 mg by mouth daily. Swallow whole.    [provider]  azelastine (ASTELIN) 0.1 % nasal spray Place 2 sprays into both nostrils 2 (two) times daily. Use in each nostril as directed Patient not taking: Reported on 01/15/2023 02/19/22   Birder Robson, MD  busPIRone (BUSPAR) 5 MG tablet TAKE ONE (1) TABLET BY MOUTH TWO (2) TIMES DAILY 04/03/23   Milian, Aleen Campi, FNP  cholestyramine (QUESTRAN) 4 g packet Take 1 packet (4 g total) by mouth daily. 04/02/23   Milian, Aleen Campi, FNP  cyclobenzaprine (FLEXERIL) 5 MG tablet Take 5 mg by mouth at bedtime as needed. 08/06/22   [provider]  EPINEPHrine 0.3 mg/0.3 mL IJ SOAJ injection Inject 0.3 mg into the muscle as needed for anaphylaxis. 02/19/22   Birder Robson, MD  fluticasone (FLONASE) 50 MCG/ACT nasal spray Place 2 sprays into both nostrils  daily. Patient not taking: Reported on 01/15/2023 02/19/22   Birder Robson, MD  lisinopril (ZESTRIL) 5 MG tablet Take 1 tablet (5 mg total) by mouth daily. 12/21/22   Milian, Aleen Campi, FNP  loratadine (CLARITIN) 10 MG tablet Take 1 tablet (10 mg total) by mouth at bedtime. 08/04/17   Vassie Loll, MD  oxyCODONE-acetaminophen (PERCOCET) 10-325 MG tablet Take 1 tablet by mouth 4 (four) times daily as needed. 03/09/23   [provider]  pantoprazole (PROTONIX) 40 MG tablet TAKE 1 TABLET BY MOUTH TWICE DAILY BEFORE MEALS 11/19/22   Tiffany Kocher, PA-C  potassium chloride SA (KLOR-CON M) 20 MEQ tablet Take 1 tablet (20 mEq total) by mouth daily. 07/27/22 08/26/22  Arrie Senate, FNP  sertraline (ZOLOFT) 50 MG tablet Take 1 tablet (50 mg total) by mouth daily. 08/19/19   Jannifer Rodney A, FNP  triamcinolone cream (KENALOG) 0.1 % Apply topically. 01/02/22   [provider]  Upadacitinib ER (RINVOQ) 15 MG TB24 Take 1 tablet by mouth daily. 11/16/19   [provider]  valACYclovir (VALTREX) 1000 MG tablet Take 1 tablet (1,000 mg total) by mouth 2 (two) times daily. 04/02/23   Milian, Aleen Campi, FNP  Vitamin D, Ergocalciferol, (DRISDOL) 1.25 MG (50000 UNIT) CAPS capsule Take 1 capsule (50,000 Units total) by mouth every 7 (seven) days for 12 doses. 04/03/23 06/20/23  Milian, Aleen Campi, FNP  ZTLIDO 1.8 % PTCH Apply topically. 04/09/22   [provider]      Allergies    Alpha-gal, Macrobid [nitrofurantoin], Pravastatin, Macrolides and ketolides, Methotrexate, Prevacid [lansoprazole], Remicade [infliximab], Tofacitinib, Ibuprofen, Keflex [cephalexin], Penicillins, and Sulfa antibiotics    Review of Systems   Review of Systems  All other systems reviewed and are negative.   Physical Exam Updated Vital Signs BP 132/81   Pulse 69   Temp 98.2 F (36.8 C) (Oral)   Resp 18   Ht 5\' 6"  (1.676 m)   Wt 78 kg   SpO2 97%   BMI 27.76 kg/m  Physical  Exam Vitals and nursing note reviewed.  Constitutional:      General: She is not in acute distress.    Appearance: Normal appearance.  HENT:     Head: Normocephalic and atraumatic.  Eyes:     General:        Right eye: No discharge.        Left eye: No discharge.  Cardiovascular:     Comments: Regular rate and rhythm.  S1/S2 are distinct without any evidence of murmur, rubs, or gallops.  Radial pulses are 2+ bilaterally.  Dorsalis pedis pulses are 2+ bilaterally.  No evidence of pedal edema. Pulmonary:     Comments: Clear to auscultation bilaterally.  Normal effort.  No respiratory distress.  No evidence of wheezes, rales, or rhonchi heard throughout. Abdominal:     General: Abdomen is flat. Bowel sounds are normal. There is no distension.     Tenderness: There is no abdominal tenderness. There is no guarding or rebound.  Musculoskeletal:        General: Normal range of motion.     Cervical back: Neck supple.     Comments: 2+ posterior tibialis bilaterally.  Left leg is mildly swollen but minimally tender to palpation.  No evidence of elevated knot in the left calf.  Skin:    General: Skin is warm and dry.     Findings: No rash.  Neurological:     General: No focal deficit present.     Mental Status: She is alert.  Psychiatric:        Mood and Affect: Mood normal.        Behavior: Behavior normal.     ED Results / Procedures / Treatments   Labs (all labs ordered are listed, but only abnormal results are displayed) Labs Reviewed  CBC WITH DIFFERENTIAL/PLATELET - Abnormal; Notable for the following components:      Result Value   Hemoglobin 11.4 (*)    HCT 35.4 (*)    All other components within normal limits  COMPREHENSIVE METABOLIC PANEL - Abnormal; Notable for the following components:   BUN 7 (*)    Calcium 8.8 (*)    All other components within normal limits    EKG None  Radiology US Venous Img Lower Unilateral Left  Result Date: 04/04/2023 CLINICAL DATA:   Left lower extremity pain EXAM: LEFT LOWER EXTREMITY VENOUS DOPPLER ULTRASOUND TECHNIQUE: Gray-scale sonography with compression, as well as color and duplex ultrasound, were performed to evaluate the deep venous system(s) from the level of the common femoral vein through the popliteal and proximal calf veins. COMPARISON:  None Available. FINDINGS: VENOUS Normal compressibility of the common femoral, superficial femoral, and popliteal veins, as well as the visualized calf veins. Visualized portions of profunda femoral vein and great saphenous vein unremarkable. No filling defects to suggest DVT on grayscale  or color Doppler imaging. Doppler waveforms show normal direction of venous flow, normal respiratory plasticity and response to augmentation. Limited views of the contralateral common femoral vein are unremarkable. OTHER None. Limitations: none IMPRESSION: Negative. Electronically Signed   By: Malachy Moan M.D.   On: 04/04/2023 15:07    Procedures Procedures    Medications Ordered in ED Medications - No data to display  ED Course/ Medical Decision Making/ A&P Clinical Course as of 04/04/23 1544  Thu Apr 04, 2023  1513 US Venous Img Lower Unilateral Left I personally ordered and interpreted this study and do not see any evidence of DVT.  I do agree with radiologist interpretation. [CF]  1515 CBC with Differential(!) Negative.  [CF]  1516 Comprehensive metabolic panel(!) Negative.  [CF]  1542 I went over all labs and imaging with the patient at the bedside.  Patient is comfortable with discharge.  She will follow-up with her primary care doctor.  Strict return precautions were discussed.  She is safe for discharge. [CF]    Clinical Course User Index [CF] Teressa Lower, PA-C   {   Click here for ABCD2, HEART and other calculators  Medical Decision Making Kaitlin Fleming is a 62 y.o. female patient who presents to the emergency department today for further evaluation of left leg  pain.  We will get a repeat ultrasound here.  This will evaluate for DVT.  There is no evidence of compartment syndrome or any other significant leg pathology here today.  Patient is resting comfortably in the emergency department.  She does have some slightly high blood pressure but her vital signs are otherwise normal.  Strict return precautions were discussed.  She is safe for discharge.  Amount and/or Complexity of Data Reviewed Labs: ordered. Decision-making details documented in ED Course. Radiology:  Decision-making details documented in ED Course.    Final Clinical Impression(s) / ED Diagnoses Final diagnoses:  Left leg pain    Rx / DC Orders ED Discharge Orders     None         Teressa Lower, [place] 04/04/23 1544    Rondel Baton, MD 04/06/23 [phone]

## 2023-04-04 NOTE — ED Notes (Signed)
US at this time.

## 2023-04-16 DIAGNOSIS — Z79891 Long term (current) use of opiate analgesic: Secondary | ICD-10-CM | POA: Diagnosis not present

## 2023-04-16 DIAGNOSIS — M5459 Other low back pain: Secondary | ICD-10-CM | POA: Diagnosis not present

## 2023-04-16 DIAGNOSIS — M5416 Radiculopathy, lumbar region: Secondary | ICD-10-CM | POA: Diagnosis not present

## 2023-04-16 DIAGNOSIS — M4326 Fusion of spine, lumbar region: Secondary | ICD-10-CM | POA: Diagnosis not present

## 2023-04-16 DIAGNOSIS — G894 Chronic pain syndrome: Secondary | ICD-10-CM | POA: Diagnosis not present

## 2023-06-04 ENCOUNTER — Ambulatory Visit: Payer: Self-pay | Admitting: Family Medicine

## 2023-06-04 ENCOUNTER — Other Ambulatory Visit: Payer: Self-pay | Admitting: Gastroenterology

## 2023-06-04 ENCOUNTER — Encounter: Payer: Self-pay | Admitting: Nurse Practitioner

## 2023-06-04 ENCOUNTER — Ambulatory Visit: Payer: Medicaid Other | Admitting: Nurse Practitioner

## 2023-06-04 ENCOUNTER — Other Ambulatory Visit: Payer: Self-pay | Admitting: Family Medicine

## 2023-06-04 VITALS — BP 123/72 | HR 81 | Temp 97.7°F | Ht 66.0 in | Wt 176.8 lb

## 2023-06-04 DIAGNOSIS — R300 Dysuria: Secondary | ICD-10-CM | POA: Diagnosis not present

## 2023-06-04 DIAGNOSIS — N3001 Acute cystitis with hematuria: Secondary | ICD-10-CM

## 2023-06-04 DIAGNOSIS — K2100 Gastro-esophageal reflux disease with esophagitis, without bleeding: Secondary | ICD-10-CM

## 2023-06-04 LAB — MICROSCOPIC EXAMINATION
Renal Epithel, UA: NONE SEEN /[HPF]
Yeast, UA: NONE SEEN

## 2023-06-04 LAB — URINALYSIS, ROUTINE W REFLEX MICROSCOPIC
Bilirubin, UA: NEGATIVE
Glucose, UA: NEGATIVE
Ketones, UA: NEGATIVE
Nitrite, UA: POSITIVE — AB
Protein,UA: NEGATIVE
Specific Gravity, UA: 1.015 (ref 1.005–1.030)
Urobilinogen, Ur: 0.2 mg/dL (ref 0.2–1.0)
pH, UA: 6 (ref 5.0–7.5)

## 2023-06-04 MED ORDER — DOXYCYCLINE HYCLATE 100 MG PO TABS
100.0000 mg | ORAL_TABLET | Freq: Two times a day (BID) | ORAL | 0 refills | Status: DC
Start: 2023-06-04 — End: 2023-06-04

## 2023-06-04 MED ORDER — FOSFOMYCIN TROMETHAMINE 3 G PO PACK: 3.0000 g | PACK | Freq: Once | ORAL | Status: DC

## 2023-06-04 MED ORDER — FOSFOMYCIN TROMETHAMINE 3 G PO PACK: 3.0000 g | PACK | Freq: Once | ORAL | 0 refills | Status: AC

## 2023-06-04 NOTE — Telephone Encounter (Signed)
Pt is aware.  

## 2023-06-04 NOTE — Addendum Note (Signed)
Addended by: Martina Sinner on: 06/04/2023 03:16 PM   Modules accepted: Orders

## 2023-06-04 NOTE — Telephone Encounter (Signed)
Copied from CRM [id number]. Topic: Clinical - Red Word Triage >> Jun 04, 2023  9:18 AM Maxwell Marion wrote: [facility] that prompted transfer to Nurse Triage: burning when urinating, back is also hurting and feeling weak on the stomach, also frequently urinating, started a couple of days ago but is getting worse, she said she thinks she is having a bladder infection because she has had this before   Chief Complaint:  Daughter called  and stated her mother legs are swelling  even more. Mother states her mother medication was recently change. Symptoms: Leg Swelling Frequency:  X 4 day Pertinent Negatives: Patient denies fever. Disposition: [] ED /[] Urgent Care (no appt availability in office) / [x] Appointment(In office/virtual)/ []  [facility]/ [] Home Care/ [] Refused Recommended Disposition /[] [place] [facility]/ []  Follow-up with PCP Additional Notes: Scheduled via my chart per daughter Reason for Disposition  Side (flank) or lower back pain present  Answer Assessment - Initial Assessment Questions 1. SEVERITY: "How bad is the pain?"  (e.g., Scale 1-10; mild, moderate, or severe)   - MODERATE (4-7): interferes with normal activities  Rates as 6 out of 10   2. FREQUENCY: "How many times have you had painful urination today?"      3 times 3. PATTERN: "Is pain present every time you urinate or just sometimes?"      Every time 4. ONSET: "When did the painful urination start?"       Saturday 5. FEVER: "Do you have a fever?" If Yes, ask: "What is your temperature, how was it measured, and when did it start?"     Denies 6. PAST UTI: "Have you had a urine infection before?" If Yes, ask: "When was the last time?" and "What happened that time?"       Recent  UTI 3-6 months- Given a prescription 7. CAUSE: "What do you think is causing the painful urination?"  (e.g., UTI, scratch, Herpes sore)     UTI 8. OTHER SYMPTOMS: "Do you have any other symptoms?" (e.g., blood in urine, flank pain,  genital sores, urgency, vaginal discharge)     Flan Pain on right side, Urgency  frequency,  Incontinence,  Urine has a a little smell 9. PREGNANCY: "Is there any chance you are pregnant?" "When was your last menstrual period?"     Denies  Protocols used: Urination Pain - Female-A-AH

## 2023-06-04 NOTE — Progress Notes (Addendum)
Acute Office Visit  Subjective:     Patient ID: Kaitlin Fleming, female    DOB: Jan 14, 1961, 63 y.o.   MRN: [id number]  Chief Complaint  Patient presents with   Urinary Frequency    Symptoms since saturday   Dysuria   HPI Kaitlin Fleming is a 63 y.o. female present 06/04/2023 for an acute visit complaining of urinary frequency, urgency and dysuria x 3 days with flank pain, without fever, chills, or abnormal vaginal discharge or bleeding.   Urinary Frequency  Associated symptoms include flank pain and frequency. Pertinent negatives include no chills, nausea or vomiting.  Dysuria  Associated symptoms include flank pain and frequency. Pertinent negatives include no chills, nausea or vomiting.      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   Lymphadenopathy of head and neck 11/03/2020   Fatigue 12/30/2020   Allergy to alpha-gal 01/27/2021   Gastroenteritis 07/20/2022   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   Anxiety 12/20/2022   Vitamin D deficiency 12/20/2022   Primary hypertension 04/02/2023   Age-related osteoporosis without current pathological fracture 07/20/2021   Facet arthropathy 11/12/2022   Idiopathic progressive polyneuropathy 08/17/2019   Low back pain 08/17/2019   Resolved Ambulatory Problems    Diagnosis Date Noted   Influenza due to influenza virus, type B 08/02/2017   Influenza B 08/02/2017   Preventative health care 06/25/2019   Congestion of nasal sinus 06/09/2020   Nausea 01/06/2021   FH: colon cancer 01/27/2021   Upper  respiratory tract infection 03/14/2021   Severe sepsis (HCC) 07/20/2022   Past Medical History:  Diagnosis Date   Chronic back pain    Chronic pain    Complication of anesthesia    DDD (degenerative disc disease)    Depression    History of blood transfusion    History of bronchitis 19yrs ago   Hyperlipidemia    Joint pain    Joint swelling    Kidney stone    Osteoarthritis    Peripheral edema    Pneumonia 13yrs ago   PONV (postoperative nausea and vomiting)    Weakness     Review of Systems  Constitutional:  Negative for chills and fever.  HENT:  Negative for congestion and sore throat.   Respiratory:  Negative for cough, shortness of breath and wheezing.   Gastrointestinal:  Negative for constipation, diarrhea, nausea and vomiting.  Genitourinary:  Positive for dysuria, flank pain and frequency.  Musculoskeletal:  Negative for falls and myalgias.  Neurological:  Negative for dizziness and headaches.   Negative unless indicated in HPI    Objective:    BP 123/72   Pulse 81   Temp 97.7 F (36.5 C) (Temporal)   Ht 5\' 6"  (1.676 m)   Wt 176 lb 12.8 oz (80.2 kg)   SpO2 100%   BMI 28.54 kg/m  BP Readings from Last 3 Encounters:  06/04/23 123/72  04/04/23 132/81  04/02/23 129/73   Wt Readings from Last 3 Encounters:  06/04/23 176 lb 12.8 oz (80.2 kg)  04/04/23 172 lb (78 kg)  04/02/23 176 lb (79.8 kg)      Physical  Exam Vitals and nursing note reviewed.  Constitutional:      Appearance: She is overweight.  HENT:     Head: Normocephalic and atraumatic.  Eyes:     Extraocular Movements: Extraocular movements intact.     Conjunctiva/sclera: Conjunctivae normal.     Pupils: Pupils are equal, round, and reactive to light.  Cardiovascular:     Rate and Rhythm: Normal rate and regular rhythm.  Pulmonary:     Effort: Pulmonary effort is normal.     Breath sounds: Normal breath sounds.  Abdominal:     Tenderness: There is no abdominal tenderness. There is left  CVA tenderness. There is no guarding or rebound.  Skin:    General: Skin is warm and dry.     Findings: No rash.  Neurological:     Mental Status: She is alert and oriented to person, place, and time. Mental status is at baseline.  Psychiatric:        Mood and Affect: Mood normal.        Behavior: Behavior normal.        Thought Content: Thought content normal.        Judgment: Judgment normal.   Urine dipstick shows positive for RBC's, positive for nitrates, and positive for leukocytes.  Micro exam:11-30 WBC's per HPF, 0-2 RBC's per HPF, many + bacteria, and epithelial cells 0-10 Results for orders placed or performed in visit on 06/04/23  Microscopic Examination   Urine  Result Value Ref Range   WBC, UA 11-30 (A) 0 - 5 /hpf   RBC, Urine 0-2 0 - 2 /hpf   Epithelial Cells (non renal) 0-10 0 - 10 /hpf   Renal Epithel, UA None seen None seen /hpf   Crystal Type WILL FOLLOW    Mucus, UA WILL FOLLOW    Bacteria, UA WILL FOLLOW    Yeast, UA WILL FOLLOW    Trichomonas, UA WILL FOLLOW    Urinalysis Comments WILL FOLLOW   Urinalysis, Routine w reflex microscopic  Result Value Ref Range   Specific Gravity, UA 1.015 1.005 - 1.030   pH, UA 6.0 5.0 - 7.5   Color, UA Yellow Yellow   Appearance Ur Clear Clear   Leukocytes,UA 3+ (A) Negative   Protein,UA Negative Negative/Trace   Glucose, UA Negative Negative   Ketones, UA Negative Negative   RBC, UA Trace (A) Negative   Bilirubin, UA Negative Negative   Urobilinogen, Ur 0.2 0.2 - 1.0 mg/dL   Nitrite, UA Positive (A) Negative   Microscopic Examination See below:         Assessment & Plan:  Dysuria -     Urinalysis, Routine w reflex microscopic -     Urine Culture -     Microscopic Examination -     Fosfomycin Tromethamine  Acute cystitis with hematuria -     Urine Culture -     Microscopic Examination -     Fosfomycin Tromethamine  Kaitlin Fleming is a 63 year old Caucasian female seen today for acute cystitis Will treat her with  broad-spectrum antibiotic Fosfomycin 3 g single dose while waiting for culture result to come back as needed Client understand based on culture result and no antibiotic may be needed Increase hydration, may use Pyridium OTC prn. Call or return to clinic prn if these symptoms worsen or fail to improve as anticipated.   Encourage healthy lifestyle choices, including diet (rich in fruits, vegetables, and lean proteins, and low in salt and simple carbohydrates) and exercise (at  least 30 minutes of moderate physical activity daily).     The above assessment and management plan was discussed with the patient. The patient verbalized understanding of and has agreed to the management plan. Patient is aware to call the clinic if they develop any new symptoms or if symptoms persist or worsen. Patient is aware when to return to the clinic for a follow-up visit. Patient educated on when it is appropriate to go to the emergency department.  Return if symptoms worsen or fail to improve.  Arrie Aran Santa Lighter, [facility] [address] [place], [place] [address] [phone]  Note: This document was prepared by [employer]ubin Milan voice dictation technology and any errors that results from this process are unintentional.

## 2023-06-04 NOTE — Addendum Note (Signed)
Addended by: Daisy Blossom on: 06/04/2023 04:19 PM   Modules accepted: Orders

## 2023-06-04 NOTE — Telephone Encounter (Signed)
  Chief Complaint: Medication issue  Additional Notes: Patient states she went to pick up her antibiotic and states she is allergic to doxycyline. Patient would like her antibiotic changed.  Patient states she can not take capsules, it has to be a solid pill.  Requesting call back at her cell phone # [phone]. Called [facility] and made staff aware. Copied from CRM [id number]. Topic: Clinical - Prescription Issue >> Jun 04, 2023  2:42 PM Carloyn Manner C wrote: Reason for CRM: Patient was issued doxycycline 100 mg twice a day for 7 days and is allergic to the medication. Reason for Disposition  [1] Caller has URGENT medicine question about med that PCP or specialist prescribed AND [2] triager unable to answer question  Answer Assessment - Initial Assessment Questions 1. NAME of MEDICINE: "What medicine(s) are you calling about?"     Doxycycline  2. QUESTION: "What is your question?" (e.g., double dose of medicine, side effect)     Patient asking if she can have her antibiotic switched and states she is allergic doxycyline. States last time she took it was a couple of years ago and states it caused blisters and a red rash.  3. PRESCRIBER: "Who prescribed the medicine?" Reason: if prescribed by specialist, call should be referred to that group.     [facility].  4. SYMPTOMS: "Do you have any symptoms?" If Yes, ask: "What symptoms are you having?"  "How bad are the symptoms (e.g., mild, moderate, severe)     Patient has symptoms and was diagnosed with UTI today.  Protocols used: Medication Question Call-A-AH

## 2023-06-06 ENCOUNTER — Telehealth: Payer: Self-pay

## 2023-06-06 NOTE — Telephone Encounter (Signed)
Tried calling patient. Unable to lm on vm. LS

## 2023-06-06 NOTE — Telephone Encounter (Signed)
Copied from CRM [id number]. Topic: Clinical - Medical Advice >> Jun 06, 2023 12:54 PM Kaitlin Fleming wrote: Reason for CRM: Patient requests that Kaitlin Fleming takes a look at her last test results, she was not happy with the provider she saw at her last appointment and she thinks she still needs more medication as she's still in pain and uti doesn't seem to be clearing up. She would like someone to call her back to advise what Kaitlin Fleming thinks [phone]

## 2023-06-07 ENCOUNTER — Ambulatory Visit: Payer: Self-pay | Admitting: Family Medicine

## 2023-06-07 ENCOUNTER — Telehealth: Payer: Self-pay | Admitting: Family Medicine

## 2023-06-07 LAB — URINE CULTURE

## 2023-06-07 MED ORDER — FOSFOMYCIN TROMETHAMINE 3 G PO PACK: 3.0000 g | PACK | Freq: Once | ORAL | 0 refills | Status: AC

## 2023-06-07 NOTE — Telephone Encounter (Signed)
  Chief Complaint: UTI worse after antibiotic dose Symptoms: chills, bladder pressure, urinary frequency, nausea Frequency: worsening since Tuesday. Pertinent Negatives: Patient denies fever, blood in urine. Disposition: [] ED /[] Urgent Care (no appt availability in office) / [x] Appointment(In office/virtual)/ []  [facility]/ [] Home Care/ [x] Refused Recommended Disposition /[] [facility]/ []  Follow-up with PCP Additional Notes: Read patient the note from her PCP in her chart: "Unfortunately the culture is not back yet. I agree with Sandra's treatment of broad spectrum antibiotics and AZO while we wait for the culture. If she is having increasing flank pain, hematuria, nausea, vomiting, fever, recommend a CT for stone eval." Patient states she already knows she has a kidney stone that she was diagnosed with 15 years ago. Please call patient back at [phone] at her cell phone number as home phone does not allow for voicemail.  Copied from CRM [id number]. Topic: General - Other >> Jun 07, 2023  9:31 AM Louie Casa B wrote: Reason for CRM: patient is calling in because she wants to know  once all her labs are back if she needs to have a new rx because she was prescribed something  Please call patient [phone] Reason for Disposition  [1] SEVERE pain (e.g., excruciating) AND [2] no improvement 2 hours after pain medications  Answer Assessment - Initial Assessment Questions 1. MAIN SYMPTOM: "What is the main symptom you are concerned about?" (e.g., painful urination, urine frequency)     Nausea, pubic/lower abdominal pain, bladder pressure and urinary frequency.  2. BETTER-SAME-WORSE: "Are you getting better, staying the same, or getting worse compared to how you felt at your last visit to the doctor (most recent medical visit)?"     Worse.  3. PAIN: "How bad is the pain?"  (e.g., Scale 1-10; mild, moderate, or severe)   - MILD (1-3): complains slightly about urination hurting    - MODERATE (4-7): interferes with normal activities     - SEVERE (8-10): excruciating, unwilling or unable to urinate because of the pain      7-8/10.  4. FEVER: "Do you have a fever?" If Yes, ask: "What is it, how was it measured, and when did it start?"     Denies.  5. OTHER SYMPTOMS: "Do you have any other symptoms?" (e.g., blood in the urine, flank pain, vaginal discharge)     Chills.  6. DIAGNOSIS: "When was the UTI diagnosed?" "By whom?" "Was it a kidney infection, bladder infection or both?"     06/04/23 UTI by NP Evern Bio at PCP office.  7. ANTIBIOTIC: "What antibiotic(s) are you taking?" "How many times per day?"     Fosfomycin 3g PO.  8. ANTIBIOTIC - START DATE: "When did you start taking the antibiotic?"     Tuesday, one time dose.  Protocols used: Urinary Tract Infection on Antibiotic Follow-up Call - [facility]

## 2023-06-07 NOTE — Telephone Encounter (Signed)
For patient continued UTI symptoms will treat with second Fosfomycin. We are very limited based on her allergies to medications to treat her with long courses of antibiotics. If she is still feeling worse with flank pain, n/v, fever, fatigue, recommend she go to the ED for IV antibiotics.

## 2023-06-07 NOTE — Telephone Encounter (Signed)
Patient not answering phone. Replied via [employer].

## 2023-06-09 ENCOUNTER — Emergency Department (HOSPITAL_COMMUNITY)
Admission: EM | Admit: 2023-06-09 | Discharge: 2023-06-09 | Disposition: A | Discharge status: Home / Self Care | Payer: Medicaid Other | Attending: Emergency Medicine | Admitting: Emergency Medicine

## 2023-06-09 ENCOUNTER — Encounter (HOSPITAL_COMMUNITY): Payer: Self-pay

## 2023-06-09 ENCOUNTER — Other Ambulatory Visit: Payer: Self-pay

## 2023-06-09 ENCOUNTER — Emergency Department (HOSPITAL_COMMUNITY): Payer: Medicaid Other

## 2023-06-09 DIAGNOSIS — K429 Umbilical hernia without obstruction or gangrene: Secondary | ICD-10-CM | POA: Diagnosis not present

## 2023-06-09 DIAGNOSIS — N3000 Acute cystitis without hematuria: Secondary | ICD-10-CM | POA: Insufficient documentation

## 2023-06-09 DIAGNOSIS — R1030 Lower abdominal pain, unspecified: Secondary | ICD-10-CM | POA: Diagnosis present

## 2023-06-09 DIAGNOSIS — K5710 Diverticulosis of small intestine without perforation or abscess without bleeding: Secondary | ICD-10-CM | POA: Diagnosis not present

## 2023-06-09 DIAGNOSIS — Z87442 Personal history of urinary calculi: Secondary | ICD-10-CM | POA: Diagnosis not present

## 2023-06-09 LAB — COMPREHENSIVE METABOLIC PANEL
ALT: 13 U/L (ref 0–44)
AST: 15 U/L (ref 15–41)
Albumin: 3.7 g/dL (ref 3.5–5.0)
Alkaline Phosphatase: 97 U/L (ref 38–126)
Anion gap: 10 (ref 5–15)
BUN: 8 mg/dL (ref 8–23)
CO2: 28 mmol/L (ref 22–32)
Calcium: 8.9 mg/dL (ref 8.9–10.3)
Chloride: 99 mmol/L (ref 98–111)
Creatinine, Ser: 0.6 mg/dL (ref 0.44–1.00)
GFR, Estimated: >60 mL/min (ref >60)
Glucose, Bld: 101 mg/dL — ABNORMAL HIGH (ref 70–99)
Potassium: 3.7 mmol/L (ref 3.5–5.1)
Sodium: 137 mmol/L (ref 135–145)
Total Bilirubin: 0.8 mg/dL (ref 0.0–1.2)
Total Protein: 7.3 g/dL (ref 6.5–8.1)

## 2023-06-09 LAB — CBC WITH DIFFERENTIAL/PLATELET
Abs Immature Granulocytes: 0.01 10*3/uL (ref 0.00–0.07)
Basophils Absolute: 0 10*3/uL (ref 0.0–0.1)
Basophils Relative: 0 %
Eosinophils Absolute: 0.2 10*3/uL (ref 0.0–0.5)
Eosinophils Relative: 4 %
HCT: 33.8 % — ABNORMAL LOW (ref 36.0–46.0)
Hemoglobin: 10.9 g/dL — ABNORMAL LOW (ref 12.0–15.0)
Immature Granulocytes: 0 %
Lymphocytes Relative: 15 %
Lymphs Abs: 0.8 10*3/uL (ref 0.7–4.0)
MCH: 29.5 pg (ref 26.0–34.0)
MCHC: 32.2 g/dL (ref 30.0–36.0)
MCV: 91.6 fL (ref 80.0–100.0)
Monocytes Absolute: 0.6 10*3/uL (ref 0.1–1.0)
Monocytes Relative: 11 %
Neutro Abs: 3.6 10*3/uL (ref 1.7–7.7)
Neutrophils Relative %: 70 %
Platelets: 222 10*3/uL (ref 150–400)
RBC: 3.69 MIL/uL — ABNORMAL LOW (ref 3.87–5.11)
RDW: 12.8 % (ref 11.5–15.5)
WBC: 5.1 10*3/uL (ref 4.0–10.5)
nRBC: 0 % (ref 0.0–0.2)

## 2023-06-09 LAB — URINALYSIS, ROUTINE W REFLEX MICROSCOPIC
Bacteria, UA: NONE SEEN
Bilirubin Urine: NEGATIVE
Glucose, UA: NEGATIVE mg/dL
Ketones, ur: NEGATIVE mg/dL
Nitrite: NEGATIVE
Protein, ur: NEGATIVE mg/dL
Specific Gravity, Urine: 1.018 (ref 1.005–1.030)
WBC, UA: >50 WBC/hpf (ref 0–5)
pH: 5 (ref 5.0–8.0)

## 2023-06-09 MED ORDER — IOHEXOL 300 MG/ML  SOLN
100.0000 mL | Freq: Once | INTRAMUSCULAR | Status: AC | PRN
Start: 2023-06-09 — End: 2023-06-09
  Administered 2023-06-09: 100 mL via INTRAVENOUS

## 2023-06-09 MED ORDER — PHENAZOPYRIDINE HCL 200 MG PO TABS
200.0000 mg | ORAL_TABLET | Freq: Three times a day (TID) | ORAL | 0 refills | Status: DC
Start: 2023-06-09 — End: 2023-07-11

## 2023-06-09 MED ORDER — PHENAZOPYRIDINE HCL 100 MG PO TABS
200.0000 mg | ORAL_TABLET | Freq: Once | ORAL | Status: AC
Start: 2023-06-09 — End: 2023-06-09
  Administered 2023-06-09: 200 mg via ORAL
  Filled 2023-06-09: qty 2

## 2023-06-09 MED ORDER — LEVOFLOXACIN IN D5W 750 MG/150ML IV SOLN
750.0000 mg | Freq: Once | INTRAVENOUS | Status: AC
  Administered 2023-06-09: 750 mg via INTRAVENOUS
  Filled 2023-06-09: qty 150

## 2023-06-09 MED ORDER — LEVOFLOXACIN 500 MG PO TABS: 500.0000 mg | ORAL_TABLET | Freq: Every day | ORAL | 0 refills | Status: DC

## 2023-06-09 MED ORDER — ONDANSETRON 4 MG PO TBDP
4.0000 mg | ORAL_TABLET | Freq: Three times a day (TID) | ORAL | 0 refills | Status: AC | PRN
Start: 2023-06-09 — End: ?

## 2023-06-09 MED ORDER — ONDANSETRON HCL 4 MG/2ML IJ SOLN
4.0000 mg | Freq: Once | INTRAMUSCULAR | Status: AC
  Administered 2023-06-09: 4 mg via INTRAVENOUS
  Filled 2023-06-09: qty 2

## 2023-06-09 MED ORDER — SODIUM CHLORIDE 0.9 % IV BOLUS
1000.0000 mL | Freq: Once | INTRAVENOUS | Status: AC
  Administered 2023-06-09: 1000 mL via INTRAVENOUS

## 2023-06-09 NOTE — ED Triage Notes (Signed)
Pt reports she has been being treated for a UTI for eight days and has had 2 doses of fosfomycin but is not feeling any improvement.

## 2023-06-09 NOTE — ED Provider Notes (Signed)
[place] EMERGENCY DEPARTMENT AT [facility] Provider Note   CSN: [id number] Arrival date & time: 06/09/23  1450     History  Chief Complaint  Patient presents with   Urinary Tract Infection    Kaitlin Fleming is a 63 y.o. female.  Pt is a 63 yo female with pmhx significant for hld, chronic back pain, gerd, kidney stones, alpha-gal allergy, and RA.  Pt said she has felt like she's had a UTI for a week.  She has been to her provider twice.  She has been given fosfomycin twice without improvement in sx.  She did have a urinary culture sent which grew out e.coli and klebsiella.  The pt has several allergies and can't take pills that are capsules due to the alpha-gal allergy, so there was a concern that she may need admission for iv abx.  Pt has had nausea, but no fever.  She does have some back pain, but it's hard to say if that is her chronic pain or something else.         Home Medications Prior to Admission medications   Medication Sig Start Date End Date Taking? Authorizing Provider  levofloxacin (LEVAQUIN) 500 MG tablet Take 1 tablet (500 mg total) by mouth daily. 06/09/23  Yes Jacalyn Lefevre, MD  ondansetron (ZOFRAN-ODT) 4 MG disintegrating tablet Take 1 tablet (4 mg total) by mouth every 8 (eight) hours as needed. 06/09/23  Yes Jacalyn Lefevre, MD  phenazopyridine (PYRIDIUM) 200 MG tablet Take 1 tablet (200 mg total) by mouth 3 (three) times daily. 06/09/23  Yes Jacalyn Lefevre, MD  aspirin EC 81 MG tablet Take 81 mg by mouth daily. Swallow whole.    [provider]  azelastine (ASTELIN) 0.1 % nasal spray Place 2 sprays into both nostrils 2 (two) times daily. Use in each nostril as directed Patient not taking: Reported on 01/15/2023 02/19/22   Birder Robson, MD  busPIRone (BUSPAR) 5 MG tablet TAKE ONE (1) TABLET BY MOUTH TWO (2) TIMES DAILY 04/03/23   Milian, Aleen Campi, FNP  cholestyramine (QUESTRAN) 4 g packet Take 1 packet (4 g total) by mouth daily.  04/02/23   Milian, Aleen Campi, FNP  cyclobenzaprine (FLEXERIL) 5 MG tablet Take 5 mg by mouth at bedtime as needed. 08/06/22   [provider]  EPINEPHrine 0.3 mg/0.3 mL IJ SOAJ injection Inject 0.3 mg into the muscle as needed for anaphylaxis. 02/19/22   Birder Robson, MD  fluticasone (FLONASE) 50 MCG/ACT nasal spray Place 2 sprays into both nostrils daily. Patient not taking: Reported on 01/15/2023 02/19/22   Birder Robson, MD  lisinopril (ZESTRIL) 5 MG tablet Take 1 tablet (5 mg total) by mouth daily. 12/21/22   Milian, Aleen Campi, FNP  loratadine (CLARITIN) 10 MG tablet Take 1 tablet (10 mg total) by mouth at bedtime. 08/04/17   Vassie Loll, MD  oxyCODONE-acetaminophen (PERCOCET) 10-325 MG tablet Take 1 tablet by mouth 4 (four) times daily as needed. 03/09/23   [provider]  pantoprazole (PROTONIX) 40 MG tablet TAKE 1 TABLET BY MOUTH TWICE DAILY BEFORE MEALS 11/19/22   Tiffany Kocher, PA-C  potassium chloride SA (KLOR-CON M) 20 MEQ tablet Take 1 tablet (20 mEq total) by mouth daily. 07/27/22 08/26/22  Arrie Senate, FNP  sertraline (ZOLOFT) 50 MG tablet Take 1 tablet (50 mg total) by mouth daily. 08/19/19   Jannifer Rodney A, FNP  triamcinolone cream (KENALOG) 0.1 % Apply topically. 01/02/22   [provider]  Upadacitinib ER (RINVOQ) 15 MG TB24 Take 1 tablet by mouth daily. 11/16/19   [provider]  valACYclovir (VALTREX) 1000 MG tablet Take 1 tablet (1,000 mg total) by mouth 2 (two) times daily. 04/02/23   Milian, Aleen Campi, FNP  Vitamin D, Ergocalciferol, (DRISDOL) 1.25 MG (50000 UNIT) CAPS capsule Take 1 capsule (50,000 Units total) by mouth every 7 (seven) days for 12 doses. 04/03/23 06/20/23  MilianAleen Campi, FNP  ZTLIDO 1.8 % PTCH Apply topically. 04/09/22   [provider]      Allergies    Alpha-gal, Macrobid [nitrofurantoin], Pravastatin, Macrolides and ketolides, Methotrexate, Prevacid [lansoprazole], Remicade  [infliximab], Tofacitinib, Doxycycline, Ibuprofen, Keflex [cephalexin], Penicillins, and Sulfa antibiotics    Review of Systems   Review of Systems  Gastrointestinal:  Positive for nausea.  Genitourinary:  Positive for dysuria.  Musculoskeletal:  Positive for back pain.  All other systems reviewed and are negative.   Physical Exam Updated Vital Signs BP (!) 119/59 (BP Location: Right Arm)   Pulse 78   Temp 98.4 F (36.9 C) (Oral)   Resp 16   Ht 5\' 6"  (1.676 m)   Wt 80.2 kg   SpO2 94%   BMI 28.54 kg/m  Physical Exam Vitals and nursing note reviewed.  Constitutional:      Appearance: Normal appearance.  HENT:     Head: Normocephalic and atraumatic.     Right Ear: External ear normal.     Left Ear: External ear normal.     Nose: Nose normal.     Mouth/Throat:     Mouth: Mucous membranes are dry.  Eyes:     Extraocular Movements: Extraocular movements intact.     Conjunctiva/sclera: Conjunctivae normal.     Pupils: Pupils are equal, round, and reactive to light.  Cardiovascular:     Rate and Rhythm: Normal rate and regular rhythm.     Pulses: Normal pulses.     Heart sounds: Normal heart sounds.  Pulmonary:     Effort: Pulmonary effort is normal.     Breath sounds: Normal breath sounds.  Abdominal:     General: Abdomen is flat. Bowel sounds are normal.     Palpations: Abdomen is soft.     Tenderness: There is abdominal tenderness in the suprapubic area.  Musculoskeletal:        General: Normal range of motion.     Cervical back: Normal range of motion and neck supple.  Skin:    General: Skin is warm.     Capillary Refill: Capillary refill takes less than 2 seconds.  Neurological:     General: No focal deficit present.     Mental Status: She is alert and oriented to person, place, and time.  Psychiatric:        Mood and Affect: Mood normal.        Behavior: Behavior normal.     ED Results / Procedures / Treatments   Labs (all labs ordered are listed, but  only abnormal results are displayed) Labs Reviewed  COMPREHENSIVE METABOLIC PANEL - Abnormal; Notable for the following components:      Result Value   Glucose, Bld 101 (*)    All other components within normal limits  CBC WITH DIFFERENTIAL/PLATELET - Abnormal; Notable for the following components:   RBC 3.69 (*)    Hemoglobin 10.9 (*)    HCT 33.8 (*)    All other components within normal limits  URINALYSIS, ROUTINE W REFLEX MICROSCOPIC - Abnormal; Notable for the  following components:   APPearance CLOUDY (*)    Hgb urine dipstick MODERATE (*)    Leukocytes,Ua LARGE (*)    Non Squamous Epithelial 0-5 (*)    All other components within normal limits    EKG None  Radiology CT ABDOMEN PELVIS W CONTRAST Result Date: 06/09/2023 CLINICAL DATA:  Pyelonephritis suspected, complicated history. Being treated for urinary tract infection. EXAM: CT ABDOMEN AND PELVIS WITH CONTRAST TECHNIQUE: Multidetector CT imaging of the abdomen and pelvis was performed using the standard protocol following bolus administration of intravenous contrast. RADIATION DOSE REDUCTION: This exam was performed according to the departmental dose-optimization program which includes automated exposure control, adjustment of the mA and/or kV according to patient size and/or use of iterative reconstruction technique. CONTRAST:  OMNIPAQUE IOHEXOL 300 MG/ML  SOLN COMPARISON:  CT scan abdomen and pelvis from 07/20/2022. FINDINGS: Lower chest: There are subsegmental atelectatic changes at the right lung base. Bilateral lungs are otherwise clear. No overt consolidation. No pleural effusion. The heart is normal in size. No pericardial effusion. There are coronary artery atherosclerotic calcifications, in keeping with coronary artery disease. Hepatobiliary: The liver is normal in size. Non-cirrhotic configuration. No suspicious mass. No intrahepatic or extrahepatic bile duct dilation. No calcified gallstones. Normal gallbladder wall  thickness. No pericholecystic inflammatory changes. Pancreas: Unremarkable. No pancreatic ductal dilatation or surrounding inflammatory changes. Spleen: Within normal limits. No focal lesion. Adrenals/Urinary Tract: Adrenal glands are unremarkable. No suspicious renal mass. No hydronephrosis. No renal or ureteric calculi. Unremarkable urinary bladder. Stomach/Bowel: There is a small diverticulum arising from the third part of duodenum (series 2, image 43). No disproportionate dilation of the small or large bowel loops. No evidence of abnormal bowel wall thickening or inflammatory changes. The appendix is unremarkable. Vascular/Lymphatic: No ascites or pneumoperitoneum. No abdominal or pelvic lymphadenopathy, by size criteria. No aneurysmal dilation of the major abdominal arteries. There are mild peripheral atherosclerotic vascular calcifications of the aorta and its major branches. Redemonstration of at least moderate narrowing of the origin of the celiac trunk secondary to combination of atheromatous plaques and median arcuate ligament. Reproductive: The uterus is surgically absent. No large adnexal mass. Other: There is a tiny fat containing umbilical hernia. The soft tissues and abdominal wall are otherwise unremarkable. Musculoskeletal: No suspicious osseous lesions. There are moderate multilevel degenerative changes in the visualized spine. L4 through S1 posterior spinal fixation noted. There are intervening laminectomies. There is neurostimulator device in this region. Redemonstration of mild anterior wedging deformity of L1 vertebral body. IMPRESSION: *No acute inflammatory process identified within the abdomen or pelvis. No CT evidence of acute pyelonephritis. *Multiple other nonacute observations, as described above. Aortic Atherosclerosis (ICD10-I70.0). Electronically Signed   By: Jules Schick M.D.   On: 06/09/2023 17:27    Procedures Procedures    Medications Ordered in ED Medications   levofloxacin (LEVAQUIN) IVPB 750 mg (750 mg Intravenous New Bag/Given 06/09/23 1621)  sodium chloride 0.9 % bolus 1,000 mL (1,000 mLs Intravenous New Bag/Given 06/09/23 1615)  phenazopyridine (PYRIDIUM) tablet 200 mg (200 mg Oral Given 06/09/23 1618)  ondansetron (ZOFRAN) injection 4 mg (4 mg Intravenous Given 06/09/23 1617)  iohexol (OMNIPAQUE) 300 MG/ML solution 100 mL (100 mLs Intravenous Contrast Given 06/09/23 1643)    ED Course/ Medical Decision Making/ A&P                                 Medical Decision Making Amount and/or Complexity of Data  Reviewed Labs: ordered. Radiology: ordered.  Risk Prescription drug management.   This patient presents to the ED for concern of uti, this involves an extensive number of treatment options, and is a complaint that carries with it a high risk of complications and morbidity.  The differential diagnosis includes uti, pyelo, kidney stone   Co morbidities that complicate the patient evaluation  hld, chronic back pain, gerd, kidney stones, alpha-gal allergy, and RA   Additional history obtained:  Additional history obtained from epic chart review  Lab Tests:  I Ordered, and personally interpreted labs.  The pertinent results include:  cbc with nl wbc, hgb 10.9 (11.4 in November); cmp nl, UA with mod hgb, large leukocytes, and >50 wbcs   Imaging Studies ordered:  I ordered imaging studies including ct  I independently visualized and interpreted imaging which showed  No acute inflammatory process identified within the abdomen or  pelvis. No CT evidence of acute pyelonephritis.  *Multiple other nonacute observations, as described above.    Aortic Atherosclerosis (ICD10-I70.0).   I agree with the radiologist interpretation   Cardiac Monitoring:  The patient was maintained on a cardiac monitor.  I personally viewed and interpreted the cardiac monitored which showed an underlying rhythm of: nsr   Medicines ordered and prescription  drug management:  I ordered medication including ivfs/levaquin  for uti  Reevaluation of the patient after these medicines showed that the patient improved I have reviewed the patients home medicines and have made adjustments as needed   Test Considered:  ct   Critical Interventions:  abx   Problem List / ED Course:  UTI:  I reviewed culture from 1/21.  There are many drugs sensitive to it.  The pt has no hx of allergy to levofloxacin or cipro, so she's given a dose of levofloxacin in the ED.  It comes in a tablet as well.  She is feeling better and tolerating pyridium as well.  Pt is stable for d/c.  Return if worse.    Reevaluation:  After the interventions noted above, I reevaluated the patient and found that they have :improved   Social Determinants of Health:  Lives at home   Dispostion:  After consideration of the diagnostic results and the patients response to treatment, I feel that the patent would benefit from discharge with outpatient f/u.          Final Clinical Impression(s) / ED Diagnoses Final diagnoses:  Acute cystitis without hematuria    Rx / DC Orders ED Discharge Orders          Ordered    levofloxacin (LEVAQUIN) 500 MG tablet  Daily        06/09/23 1748    phenazopyridine (PYRIDIUM) 200 MG tablet  3 times daily        06/09/23 1748    ondansetron (ZOFRAN-ODT) 4 MG disintegrating tablet  Every 8 hours PRN        06/09/23 1749              Jacalyn Lefevre, MD 06/09/23 1749

## 2023-06-19 ENCOUNTER — Emergency Department (HOSPITAL_COMMUNITY): Payer: Medicaid Other

## 2023-06-19 ENCOUNTER — Emergency Department (HOSPITAL_COMMUNITY)
Admission: EM | Admit: 2023-06-19 | Discharge: 2023-06-19 | Disposition: A | Discharge status: Home / Self Care | Payer: Medicaid Other | Attending: Emergency Medicine | Admitting: Emergency Medicine

## 2023-06-19 ENCOUNTER — Other Ambulatory Visit: Payer: Self-pay

## 2023-06-19 ENCOUNTER — Encounter (HOSPITAL_COMMUNITY): Payer: Self-pay

## 2023-06-19 DIAGNOSIS — Z7982 Long term (current) use of aspirin: Secondary | ICD-10-CM | POA: Insufficient documentation

## 2023-06-19 DIAGNOSIS — J101 Influenza due to other identified influenza virus with other respiratory manifestations: Secondary | ICD-10-CM | POA: Diagnosis not present

## 2023-06-19 DIAGNOSIS — R509 Fever, unspecified: Secondary | ICD-10-CM | POA: Diagnosis not present

## 2023-06-19 DIAGNOSIS — Z79899 Other long term (current) drug therapy: Secondary | ICD-10-CM | POA: Insufficient documentation

## 2023-06-19 DIAGNOSIS — Z20822 Contact with and (suspected) exposure to covid-19: Secondary | ICD-10-CM | POA: Insufficient documentation

## 2023-06-19 DIAGNOSIS — R059 Cough, unspecified: Secondary | ICD-10-CM | POA: Diagnosis not present

## 2023-06-19 DIAGNOSIS — R9389 Abnormal findings on diagnostic imaging of other specified body structures: Secondary | ICD-10-CM | POA: Diagnosis not present

## 2023-06-19 LAB — RESP PANEL BY RT-PCR (RSV, FLU A&B, COVID)  RVPGX2
Influenza A by PCR: POSITIVE — AB
Influenza B by PCR: NEGATIVE
Resp Syncytial Virus by PCR: NEGATIVE
SARS Coronavirus 2 by RT PCR: NEGATIVE

## 2023-06-19 MED ORDER — OSELTAMIVIR PHOSPHATE 6 MG/ML PO SUSR: 75.0000 mg | Freq: Two times a day (BID) | ORAL | 0 refills | Status: AC

## 2023-06-19 NOTE — ED Triage Notes (Signed)
 Pt arrived via POV c/o cough, fever and chills since this past Sunday.

## 2023-06-19 NOTE — Discharge Instructions (Signed)
 As suspected you are positive for influenza A.  Rest and make sure you are drinking plenty of fluids, I recommend Tylenol  or ibuprofen  for fever reduction and body ache relief.  You have been prescribed liquid Tamiflu  since you cannot take capsules given your alpha gal allergy .  You may take this medicine which can lessen the severity of your flu symptoms.  Get rechecked for any increased weakness, shortness of breath or any new or worsening symptoms.  You are contagious, I recommend wearing a mask around any other people, stay home is much as you are able, most flus last for approximately 7 days.

## 2023-06-19 NOTE — ED Provider Notes (Signed)
 [place] EMERGENCY DEPARTMENT AT [facility] Provider Note   CSN: [id number] Arrival date & time: 06/19/23  1059     History  Chief Complaint  Patient presents with   Cough    Kaitlin Fleming is a 63 y.o. female with a history including rheumatoid arthritis, alpha gal allergy , GERD, presenting with a 4-day history of flulike symptoms including generalized bodyaches, fever to 102, nonproductive cough and neurolyse fatigue.  She has taken ibuprofen  for fever reduction.  She has been around no other people with similar symptoms to her knowledge.  She denies abdominal pain, chest pain, shortness of breath, nausea vomiting or urinary complaints.  The history is provided by the patient.       Home Medications Prior to Admission medications   Medication Sig Start Date End Date Taking? Authorizing Provider  oseltamivir  (TAMIFLU ) 6 MG/ML SUSR suspension Take 12.5 mLs (75 mg total) by mouth 2 (two) times daily for 5 days. 06/19/23 06/24/23 Yes Feliciana Narayan, PA-C  aspirin  EC 81 MG tablet Take 81 mg by mouth daily. Swallow whole.    [provider]  azelastine  (ASTELIN ) 0.1 % nasal spray Place 2 sprays into both nostrils 2 (two) times daily. Use in each nostril as directed Patient not taking: Reported on 01/15/2023 02/19/22   Tobie Arleta SQUIBB, MD  busPIRone  (BUSPAR ) 5 MG tablet TAKE ONE (1) TABLET BY MOUTH TWO (2) TIMES DAILY 04/03/23   Milian, Marry Lenis, FNP  cholestyramine  (QUESTRAN ) 4 g packet Take 1 packet (4 g total) by mouth daily. 04/02/23   Milian, Marry Lenis, FNP  cyclobenzaprine  (FLEXERIL ) 5 MG tablet Take 5 mg by mouth at bedtime as needed. 08/06/22   [provider]  EPINEPHrine  0.3 mg/0.3 mL IJ SOAJ injection Inject 0.3 mg into the muscle as needed for anaphylaxis. 02/19/22   Tobie Arleta SQUIBB, MD  fluticasone  (FLONASE ) 50 MCG/ACT nasal spray Place 2 sprays into both nostrils daily. Patient not taking: Reported on 01/15/2023 02/19/22   Tobie Arleta SQUIBB, MD   levofloxacin  (LEVAQUIN ) 500 MG tablet Take 1 tablet (500 mg total) by mouth daily. 06/09/23   Dean Clarity, MD  lisinopril  (ZESTRIL ) 5 MG tablet Take 1 tablet (5 mg total) by mouth daily. 12/21/22   Milian, Marry Lenis, FNP  loratadine  (CLARITIN ) 10 MG tablet Take 1 tablet (10 mg total) by mouth at bedtime. 08/04/17   Ricky Fines, MD  ondansetron  (ZOFRAN -ODT) 4 MG disintegrating tablet Take 1 tablet (4 mg total) by mouth every 8 (eight) hours as needed. 06/09/23   Dean Clarity, MD  oxyCODONE -acetaminophen  (PERCOCET) 10-325 MG tablet Take 1 tablet by mouth 4 (four) times daily as needed. 03/09/23   [provider]  pantoprazole  (PROTONIX ) 40 MG tablet TAKE 1 TABLET BY MOUTH TWICE DAILY BEFORE MEALS 11/19/22   Ezzard Sonny RAMAN, PA-C  phenazopyridine  (PYRIDIUM ) 200 MG tablet Take 1 tablet (200 mg total) by mouth 3 (three) times daily. 06/09/23   Dean Clarity, MD  potassium chloride  SA (KLOR-CON  M) 20 MEQ tablet Take 1 tablet (20 mEq total) by mouth daily. 07/27/22 08/26/22  MilianMarry Lenis, FNP  sertraline  (ZOLOFT ) 50 MG tablet Take 1 tablet (50 mg total) by mouth daily. 08/19/19   Lavell Lye A, FNP  triamcinolone  cream (KENALOG) 0.1 % Apply topically. 01/02/22   [provider]  Upadacitinib ER (RINVOQ) 15 MG TB24 Take 1 tablet by mouth daily. 11/16/19   [provider]  valACYclovir  (VALTREX ) 1000 MG tablet Take 1 tablet (1,000 mg total) by  mouth 2 (two) times daily. 04/02/23   Milian, Marry Lenis, FNP  Vitamin D , Ergocalciferol , (DRISDOL ) 1.25 MG (50000 UNIT) CAPS capsule Take 1 capsule (50,000 Units total) by mouth every 7 (seven) days for 12 doses. 04/03/23 06/20/23  Cathlene Marry Lenis, FNP  ZTLIDO  1.8 % PTCH Apply topically. 04/09/22   [provider]      Allergies    Alpha-gal, Macrobid [nitrofurantoin], Pravastatin , Macrolides and ketolides, Methotrexate, Prevacid  [lansoprazole ], Remicade [infliximab ], Tofacitinib, Doxycycline ,  Ibuprofen , Keflex  [cephalexin ], Penicillins, and Sulfa antibiotics    Review of Systems   Review of Systems  Constitutional:  Positive for fever.  HENT:  Positive for rhinorrhea. Negative for congestion and sore throat.   Eyes: Negative.   Respiratory:  Positive for cough. Negative for chest tightness and shortness of breath.   Cardiovascular:  Negative for chest pain.  Gastrointestinal:  Negative for abdominal pain and nausea.  Genitourinary: Negative.   Musculoskeletal:  Positive for myalgias. Negative for arthralgias, joint swelling and neck pain.  Skin: Negative.  Negative for rash and wound.  Neurological:  Negative for dizziness, weakness, light-headedness, numbness and headaches.  Psychiatric/Behavioral: Negative.    All other systems reviewed and are negative.   Physical Exam Updated Vital Signs BP 133/73   Pulse 87   Temp 100 F (37.8 C) (Oral)   Resp 16   Ht 5' 6 (1.676 m)   Wt 80.2 kg   SpO2 95%   BMI 28.54 kg/m  Physical Exam Vitals and nursing note reviewed.  Constitutional:      Appearance: She is well-developed.  HENT:     Head: Normocephalic and atraumatic.  Eyes:     Conjunctiva/sclera: Conjunctivae normal.  Cardiovascular:     Rate and Rhythm: Normal rate and regular rhythm.     Heart sounds: Normal heart sounds.  Pulmonary:     Effort: Pulmonary effort is normal.     Breath sounds: Normal breath sounds. No wheezing.  Abdominal:     General: Bowel sounds are normal.     Palpations: Abdomen is soft.     Tenderness: There is no abdominal tenderness.  Musculoskeletal:        General: Normal range of motion.     Cervical back: Normal range of motion.  Skin:    General: Skin is warm and dry.  Neurological:     Mental Status: She is alert.     ED Results / Procedures / Treatments   Labs (all labs ordered are listed, but only abnormal results are displayed) Labs Reviewed  RESP PANEL BY RT-PCR (RSV, FLU A&B, COVID)  [id number] - Abnormal; Notable  for the following components:      Result Value   Influenza A by PCR POSITIVE (*)    All other components within normal limits    EKG None  Radiology DG Chest Portable 1 View Result Date: 06/19/2023 CLINICAL DATA:  Cough.  Fever. EXAM: PORTABLE CHEST 1 VIEW COMPARISON:  07/20/2022. FINDINGS: Bilateral lung fields are clear. Bilateral costophrenic angles are clear. Note is made of elevated right hemidiaphragm. Normal cardio-mediastinal silhouette. No acute osseous abnormalities. The soft tissues are within normal limits. IMPRESSION: No active disease. Electronically Signed   By: Ree Molt M.D.   On: 06/19/2023 13:53    Procedures Procedures    Medications Ordered in ED Medications - No data to display  ED Course/ Medical Decision Making/ A&P  Medical Decision Making Patient presenting with a now 4-day history of flulike symptoms including nonproductive cough, chills and bodyaches, differential diagnosis including bacterial versus viral flulike illness, pneumonia, strep throat, URI, acute bronchitis.  Amount and/or Complexity of Data Reviewed Labs: ordered.    Details: Respiratory panel positive for influenza A Radiology: ordered.    Details: Chest x-ray negative for pneumonia  Risk Prescription drug management. Risk Details: Patient expresses concern that she cannot tolerate any medication that is a capsule or gel tab given her alpha gal allergy .  We did discuss Tamiflu  which she is not interested in taking, liquid prescribed so as not to trigger an alpha gal reaction.           Final Clinical Impression(s) / ED Diagnoses Final diagnoses:  Influenza A    Rx / DC Orders ED Discharge Orders          Ordered    oseltamivir  (TAMIFLU ) 6 MG/ML SUSR suspension  2 times daily        06/19/23 1413              Birdena Clarity, PA-C 06/19/23 1419    Yolande Lamar BROCKS, MD 06/20/23 1425

## 2023-06-25 DIAGNOSIS — M4326 Fusion of spine, lumbar region: Secondary | ICD-10-CM | POA: Diagnosis not present

## 2023-06-25 DIAGNOSIS — M5416 Radiculopathy, lumbar region: Secondary | ICD-10-CM | POA: Diagnosis not present

## 2023-06-25 DIAGNOSIS — M5459 Other low back pain: Secondary | ICD-10-CM | POA: Diagnosis not present

## 2023-06-25 DIAGNOSIS — Z79891 Long term (current) use of opiate analgesic: Secondary | ICD-10-CM | POA: Diagnosis not present

## 2023-06-25 DIAGNOSIS — G894 Chronic pain syndrome: Secondary | ICD-10-CM | POA: Diagnosis not present

## 2023-07-01 DIAGNOSIS — M0579 Rheumatoid arthritis with rheumatoid factor of multiple sites without organ or systems involvement: Secondary | ICD-10-CM | POA: Diagnosis not present

## 2023-07-01 DIAGNOSIS — Z79899 Other long term (current) drug therapy: Secondary | ICD-10-CM | POA: Diagnosis not present

## 2023-07-02 DIAGNOSIS — T7840XA Allergy, unspecified, initial encounter: Secondary | ICD-10-CM | POA: Diagnosis not present

## 2023-07-04 ENCOUNTER — Ambulatory Visit: Payer: Medicaid Other | Admitting: Family Medicine

## 2023-07-08 ENCOUNTER — Other Ambulatory Visit: Payer: Self-pay | Admitting: Family Medicine

## 2023-07-08 DIAGNOSIS — I10 Essential (primary) hypertension: Secondary | ICD-10-CM

## 2023-07-11 ENCOUNTER — Encounter: Payer: Self-pay | Admitting: Family Medicine

## 2023-07-11 ENCOUNTER — Ambulatory Visit: Payer: Medicaid Other | Admitting: Family Medicine

## 2023-07-11 VITALS — BP 107/71 | HR 100 | Temp 98.9°F | Ht 60.0 in | Wt 170.0 lb

## 2023-07-11 DIAGNOSIS — K2100 Gastro-esophageal reflux disease with esophagitis, without bleeding: Secondary | ICD-10-CM

## 2023-07-11 DIAGNOSIS — E559 Vitamin D deficiency, unspecified: Secondary | ICD-10-CM

## 2023-07-11 DIAGNOSIS — F419 Anxiety disorder, unspecified: Secondary | ICD-10-CM | POA: Diagnosis not present

## 2023-07-11 DIAGNOSIS — R21 Rash and other nonspecific skin eruption: Secondary | ICD-10-CM

## 2023-07-11 DIAGNOSIS — I774 Celiac artery compression syndrome: Secondary | ICD-10-CM | POA: Diagnosis not present

## 2023-07-11 DIAGNOSIS — E782 Mixed hyperlipidemia: Secondary | ICD-10-CM | POA: Diagnosis not present

## 2023-07-11 DIAGNOSIS — M069 Rheumatoid arthritis, unspecified: Secondary | ICD-10-CM

## 2023-07-11 DIAGNOSIS — I10 Essential (primary) hypertension: Secondary | ICD-10-CM | POA: Diagnosis not present

## 2023-07-11 DIAGNOSIS — R6889 Other general symptoms and signs: Secondary | ICD-10-CM | POA: Diagnosis not present

## 2023-07-11 DIAGNOSIS — Z91018 Allergy to other foods: Secondary | ICD-10-CM | POA: Diagnosis not present

## 2023-07-11 DIAGNOSIS — R5383 Other fatigue: Secondary | ICD-10-CM

## 2023-07-11 DIAGNOSIS — F324 Major depressive disorder, single episode, in partial remission: Secondary | ICD-10-CM

## 2023-07-11 MED ORDER — EZETIMIBE 10 MG PO TABS: 10.0000 mg | ORAL_TABLET | Freq: Every day | ORAL | 3 refills | Status: DC

## 2023-07-11 MED ORDER — LISINOPRIL 5 MG PO TABS: 5.0000 mg | ORAL_TABLET | Freq: Every day | ORAL | 1 refills | Status: DC

## 2023-07-11 MED ORDER — PANTOPRAZOLE SODIUM 40 MG PO TBEC: 40.0000 mg | DELAYED_RELEASE_TABLET | Freq: Two times a day (BID) | ORAL | 1 refills | Status: DC

## 2023-07-11 MED ORDER — KETOCONAZOLE 2 % EX CREA: 1.0000 | TOPICAL_CREAM | Freq: Every day | CUTANEOUS | 0 refills | Status: AC

## 2023-07-11 NOTE — Progress Notes (Signed)
 Subjective:  Patient ID: Kaitlin Fleming, female    DOB: [birth year], 63 y.o.   MRN: [id number]  Patient Care Team: Arrie Senate, FNP as PCP - General (Family Medicine) Lanelle Bal, DO as Consulting Physician (Internal Medicine)   Chief Complaint:  Medical Management of Chronic Issues (3 month follow up)  HPI: Kaitlin Fleming is a 63 y.o. female presenting on 07/11/2023 for Medical Management of Chronic Issues (3 month follow up)  HPI 1. Primary hypertension Has BP monitor at home Yes BP at home average - checks sometimes, 110/80 ROS Denies anxiety, peripheral edema, changes to vision, chest pain, palpitations, sweats, SOB, PND, orthopnea Endorses fatigue, headaches  Meds lisinopril   CAD risks hypertension, hypercholesterolemia/hyperlipidemia  2. Celiac artery stenosis (HCC) She is established with vein and vascular. She plans to follow up.   3. Depression, major, single episode, in partial remission (HCC)/4. Anxiety Reports that she has a lot on her mind with husband's health. 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 States that she is handling it. She reports that she cries often. She does not wish to change her medications at this time. Denies SI.   5. Mixed hyperlipidemia Lipid/Cholesterol, Follow-up  Last lipid panel Other pertinent labs  Lab Results  Component Value Date   CHOL 226 (H) 12/19/2022   HDL 40 12/19/2022   LDLCALC 143 (H) 12/19/2022   TRIG 235 (H) 12/19/2022   CHOLHDL 5.7 (H) 12/19/2022   Lab Results  Component Value Date   ALT 13 06/09/2023   AST 15 06/09/2023   PLT 222 06/09/2023   TSH 1.790 12/19/2022     She was last seen for this 6 months ago.  Management since that visit includes questran.  She reports good compliance with treatment. She is not having side effects.   Symptoms: No chest pain No chest pressure/discomfort  No dyspnea No lower extremity edema  No numbness or tingling of extremity  No orthopnea  No palpitations No paroxysmal nocturnal dyspnea  No speech difficulty No syncope   The 10-year ASCVD risk score (Arnett DK, et al., 2019) is: 5.4% Ate a half of a cookie  ---------------------------------------------------------------------------------------------------  6. Gastroesophageal reflux disease with esophagitis without hemorrhage Current medications - would like refill of Pantoprazole.  Adverse side effects - none  Sore throat - yes Voice change - no  Hemoptysis - no  Dysphagia or dyspepsia - none  Water brash - yes  Red Flags (weight loss, hematochezia, melena, weight loss, early satiety, fevers, odynophagia, or persistent vomiting) - lack of appetite  Established with GI, but has not followed up. She is working with her husband first and then planning to follow up.   7. Allergy to alpha-gal Recently went to hospital for flare up. States that she unknowingly at meat at her friend's house. She used her Epi Pen and used oral benadryl.   8. Vitamin D deficiency Vitamin D deficiency, follow-up  Lab Results  Component Value Date   VD25OH 20.5 (L) 04/02/2023   VD25OH 17.8 (L) 12/19/2022   VD25OH 11.8 (L) 04/03/2021   CALCIUM 8.9 06/09/2023   CALCIUM 8.8 (L) 04/04/2023        Wt Readings from Last 3 Encounters:  07/11/23 170 lb (77.1 kg)  06/19/23 176 lb 12.8 oz (80.2 kg)  06/09/23 176 lb 12.8 oz (80.2 kg)    She was last seen for vitamin D deficiency 4 months ago.  Management since that visit includes supplement .  She reports poor compliance with treatment. She is not having side effects.   Symptoms: No change in energy level No numbness or tingling  No bone pain No unexplained fracture   ---------------------------------------------------------------------------------------------------  9. Rash  Noticed it 2 days ago. Using dial soap. Using powders. Has tried putting cortisone cream on it but it did not help.   Relevant past medical, surgical,  family, and social history reviewed and updated as indicated.  Allergies and medications reviewed and updated. Data reviewed: Chart in Epic.   Past Medical History:  Diagnosis Date   Allergy to alpha-gal    Celiac artery stenosis (HCC)    Chronic back pain    stenosis   Chronic pain    takes Lexapro daily   Complication of anesthesia    pt states after surgery in 2000 had to wear a heart monitor for 3 days.   DDD (degenerative disc disease)    Depression    FH: colon cancer 01/27/2021   GERD (gastroesophageal reflux disease)    takes OTC meds if needed   History of blood transfusion    as a child   History of bronchitis 23yrs ago   Hyperlipidemia    takes Fenofibrate daily   Joint pain    Joint swelling    Kidney stone    has one right now on the right side but not giving any problems   Nausea 01/06/2021   Osteoarthritis    Peripheral edema    takes HCTZ daily    Pneumonia 70yrs ago   hx of   PONV (postoperative nausea and vomiting)    Preventative health care 06/25/2019   Rheumatoid arthritis (HCC)    Rheumatoid arthritis (HCC)    Severe sepsis (HCC) 07/20/2022   Weakness    and tingling on left side    Past Surgical History:  Procedure Laterality Date   ABDOMINAL HYSTERECTOMY  05/14/1998   BACK SURGERY  03/10/2014   lumbar fusion   BASAL CELL CARCINOMA EXCISION  11/2021   LUMBAR LAMINECTOMY/DECOMPRESSION MICRODISCECTOMY Left 12/08/2013   Procedure: LEFT LUMBAR FOUR-FIVEW, LUMBAR FIVE-SACRAL ONE LAMINECTOMY;  Surgeon: Karn Cassis, MD;  Location: [facility];  Service: Neurosurgery;  Laterality: Left;   TUBAL LIGATION  24 yrs ago    Social History   Socioeconomic History   Marital status: Married    Spouse name: Not on file   Number of children: Not on file   Years of education: Not on file   Highest education level: Not on file  Occupational History   Not on file  Tobacco Use   Smoking status: Never    Passive exposure: Never   Smokeless  tobacco: Never  Vaping Use   Vaping status: Never Used  Substance and Sexual Activity   Alcohol use: No   Drug use: No   Sexual activity: Yes    Birth control/protection: Surgical  Other Topics Concern   Not on file  Social History Narrative   Not on file   Social Drivers of Health   Financial Resource Strain: Low Risk  (09/11/2022)   Received from [facility], [facility]   Overall Financial Resource Strain (CARDIA)    Difficulty of Paying Living Expenses: Not hard at all  Food Insecurity: No Food Insecurity (07/11/2023)   Hunger Vital Sign    Worried About Running Out of Food in the Last Year: Never true    Ran Out of Food in the Last Year: Never true  Transportation Needs: No  Transportation Needs (07/11/2023)   PRAPARE - Administrator, Civil Service (Medical): No    Lack of Transportation (Non-Medical): No  Physical Activity: Not on file  Stress: Not on file  Social Connections: Unknown (09/26/2021)   Received from [facility]   Social Network    Social Network: Not on file  Intimate Partner Violence: Not At Risk (07/11/2023)   Humiliation, Afraid, Rape, and Kick questionnaire    Fear of Current or Ex-Partner: No    Emotionally Abused: No    Physically Abused: No    Sexually Abused: No    Outpatient Encounter Medications as of 07/11/2023  Medication Sig   aspirin EC 81 MG tablet Take 81 mg by mouth daily. Swallow whole.   azelastine (ASTELIN) 0.1 % nasal spray Place 2 sprays into both nostrils 2 (two) times daily. Use in each nostril as directed   busPIRone (BUSPAR) 5 MG tablet TAKE ONE (1) TABLET BY MOUTH TWO (2) TIMES DAILY   cholestyramine (QUESTRAN) 4 g packet Take 1 packet (4 g total) by mouth daily.   cyclobenzaprine (FLEXERIL) 5 MG tablet Take 5 mg by mouth at bedtime as needed.   EPINEPHrine 0.3 mg/0.3 mL IJ SOAJ injection Inject 0.3 mg into the muscle as needed for anaphylaxis.   fluticasone (FLONASE) 50 MCG/ACT nasal spray Place  2 sprays into both nostrils daily.   levofloxacin (LEVAQUIN) 500 MG tablet Take 1 tablet (500 mg total) by mouth daily.   lisinopril (ZESTRIL) 5 MG tablet TAKE ONE (1) TABLET BY MOUTH EVERY DAY   loratadine (CLARITIN) 10 MG tablet Take 1 tablet (10 mg total) by mouth at bedtime.   ondansetron (ZOFRAN-ODT) 4 MG disintegrating tablet Take 1 tablet (4 mg total) by mouth every 8 (eight) hours as needed.   Oxycodone HCl 10 MG TABS Take 10 mg by mouth 4 (four) times daily as needed.   pantoprazole (PROTONIX) 40 MG tablet TAKE 1 TABLET BY MOUTH TWICE DAILY BEFORE MEALS   phenazopyridine (PYRIDIUM) 200 MG tablet Take 1 tablet (200 mg total) by mouth 3 (three) times daily.   sertraline (ZOLOFT) 50 MG tablet Take 1 tablet (50 mg total) by mouth daily.   triamcinolone cream (KENALOG) 0.1 % Apply topically.   Upadacitinib ER (RINVOQ) 15 MG TB24 Take 1 tablet by mouth daily.   valACYclovir (VALTREX) 1000 MG tablet Take 1 tablet (1,000 mg total) by mouth 2 (two) times daily.   ZTLIDO 1.8 % PTCH Apply topically.   [DISCONTINUED] oxyCODONE-acetaminophen (PERCOCET) 10-325 MG tablet Take 1 tablet by mouth 4 (four) times daily as needed.   potassium chloride SA (KLOR-CON M) 20 MEQ tablet Take 1 tablet (20 mEq total) by mouth daily.   No facility-administered encounter medications on file as of 07/11/2023.    Allergies  Allergen Reactions   Alpha-Gal Anaphylaxis, Swelling, Hives and Other (See Comments)    Serology-proven Serology-proven    Macrobid [Nitrofurantoin] Shortness Of Breath and Rash   Pravastatin Other (See Comments) and Nausea And Vomiting    Rash & heart racing. Rash & heart racing. Rash & heart racing.   Macrolides And Ketolides    Methotrexate    Prevacid [Lansoprazole] Other (See Comments)    Started foaming at the mouth   Remicade [Infliximab]    Tofacitinib Other (See Comments)    Upper respiratory infections   Doxycycline Other (See Comments)    blisters   Ibuprofen Hives    Keflex [Cephalexin] Rash   Penicillins Other (See  Comments)    Breaks out Has patient had a PCN reaction causing immediate rash, facial/tongue/throat swelling, SOB or lightheadedness with hypotension: yes Has patient had a PCN reaction causing severe rash involving mucus membranes or skin necrosis: no Has patient had a PCN reaction that required hospitalization: no Has patient had a PCN reaction occurring within the last 10 years:no If all of the above answers are "NO", then may proceed with Cephalosporin use.   Sulfa Antibiotics Other (See Comments)    Breaks out    Review of Systems As per HPI  Objective:  BP 107/71   Pulse 100   Temp 98.9 F (37.2 C)   Ht 5' (1.524 m)   Wt 170 lb (77.1 kg)   SpO2 97%   BMI 33.20 kg/m    Wt Readings from Last 3 Encounters:  07/11/23 170 lb (77.1 kg)  06/19/23 176 lb 12.8 oz (80.2 kg)  06/09/23 176 lb 12.8 oz (80.2 kg)   Physical Exam Constitutional:      General: She is awake. She is not in acute distress.    Appearance: Normal appearance. She is well-developed and well-groomed. She is obese. She is not ill-appearing, toxic-appearing or diaphoretic.  Cardiovascular:     Rate and Rhythm: Normal rate and regular rhythm.     Pulses: Normal pulses.          Radial pulses are 2+ on the right side and 2+ on the left side.       Posterior tibial pulses are 2+ on the right side and 2+ on the left side.     Heart sounds: Normal heart sounds. No murmur heard.    No gallop.  Pulmonary:     Effort: Pulmonary effort is normal. No respiratory distress.     Breath sounds: Normal breath sounds. No stridor. No wheezing, rhonchi or rales.  Musculoskeletal:     Cervical back: Full passive range of motion without pain and neck supple.     Right lower leg: No edema.     Left lower leg: No edema.  Skin:    General: Skin is warm.     Capillary Refill: Capillary refill takes less than 2 seconds.     Findings: Rash present.          Comments:  Erythematous, confluent rash with dry flaky skin   Neurological:     General: No focal deficit present.     Mental Status: She is alert, oriented to person, place, and time and easily aroused. Mental status is at baseline.     GCS: GCS eye subscore is 4. GCS verbal subscore is 5. GCS motor subscore is 6.     Motor: No weakness.  Psychiatric:        Attention and Perception: Attention and perception normal.        Mood and Affect: Mood and affect normal.        Speech: Speech normal.        Behavior: Behavior normal. Behavior is cooperative.        Thought Content: Thought content normal. Thought content does not include homicidal or suicidal ideation. Thought content does not include homicidal or suicidal plan.        Cognition and Memory: Cognition and memory normal.        Judgment: Judgment normal.     Results for orders placed or performed during the hospital encounter of 06/19/23  Resp panel by RT-PCR (RSV, Flu A&B, Covid) Anterior Nasal Swab  Collection Time: 06/19/23 11:39 AM   Specimen: Anterior Nasal Swab  Result Value Ref Range   SARS Coronavirus 2 by RT PCR NEGATIVE NEGATIVE   Influenza A by PCR POSITIVE (A) NEGATIVE   Influenza B by PCR NEGATIVE NEGATIVE   Resp Syncytial Virus by PCR NEGATIVE NEGATIVE       07/11/2023   11:12 AM 04/02/2023   11:08 AM 12/20/2022   10:15 AM 10/09/2022   10:04 AM 08/23/2022    1:02 PM  Depression screen PHQ 2/9  Decreased Interest 0 0 0 0 0  Down, Depressed, Hopeless 1 0 0 0 0  PHQ - 2 Score 1 0 0 0 0  Altered sleeping 0 0 0 0 1  Tired, decreased energy 2 2 1 1 1   Change in appetite 1 0 0 0 1  Feeling bad or failure about yourself  0 0 0 0 0  Trouble concentrating 0 0 0 0 0  Moving slowly or fidgety/restless 0 1 0 1 1  Suicidal thoughts 0 0 0 0 0  PHQ-9 Score 4 3 1 2 4   Difficult doing work/chores Somewhat difficult Very difficult Not difficult at all Somewhat difficult Somewhat difficult       07/11/2023   11:13 AM 04/02/2023    11:08 AM 12/20/2022   10:16 AM 10/09/2022   10:05 AM  GAD 7 : Generalized Anxiety Score  Nervous, Anxious, on Edge 0 0 0 0  Control/stop worrying 0 1 0 0  Worry too much - different things 0 1 0 0  Trouble relaxing 0 0 0 0  Restless 0 0 0 0  Easily annoyed or irritable 0 1 0 0  Afraid - awful might happen 0 0 0 0  Total GAD 7 Score 0 3 0 0  Anxiety Difficulty Not difficult at all Somewhat difficult Not difficult at all Not difficult at all   Pertinent labs & imaging results that were available during my care of the patient were reviewed by me and considered in my medical decision making.  Assessment & Plan:  Kynslei was seen today for medical management of chronic issues.  Diagnoses and all orders for this visit:  Primary hypertension Well controlled. Continue current regimen.  Refill provided.  Labs as below. Will communicate results to patient once available. Will await results to determine next steps.  -     CMP14+EGFR -     lisinopril (ZESTRIL) 5 MG tablet; Take 1 tablet (5 mg total) by mouth daily.  Celiac artery stenosis [facility]) Reviewed notes from Chestine Spore, MD on 01/15/2023. Patient to continue aspirin. Will start zetia as below for prevention.   Depression, major, single episode, in partial remission (HCC) Well controlled. Continue current regimen. Denies SI. Declines counseling. Safety contract established.   Anxiety As above.   Mixed hyperlipidemia Will start medication as below as patient cannot tolerate statins.  -     ezetimibe (ZETIA) 10 MG tablet; Take 1 tablet (10 mg total) by mouth daily.  Gastroesophageal reflux disease with esophagitis without hemorrhage Not at goal. Needs to follow up with GI. Patient does not wish to follow up until she can manage her husband's medical needs. Will refill for the time being for symptom management. However, recommended that patient comply with treatment and evaluation plan from GI. Reviewed notes from Tana Coast, PA on  01/27/2021 -     pantoprazole (PROTONIX) 40 MG tablet; Take 1 tablet (40 mg total) by mouth 2 (two) times daily before a meal.  Allergy to alpha-gal Reviewed notes from ED visit on 07/02/2023 [place], MD. Recommend patient continue to avoid triggers. Reiterated to patient that if she is having a reaction to not delay care.   Vitamin D deficiency Labs as below. Will communicate results to patient once available. Will await results to determine next steps.  -     VITAMIN D 25 Hydroxy (Vit-D Deficiency, Fractures)  Rheumatoid arthritis, involving unspecified site, unspecified whether rheumatoid factor present [facility]) Established with rheumatology. Reviewed notes form 07/01/2023 with Allena Katz, MD.   Fatigue, unspecified type Labs as below. Will communicate results to patient once available. Will await results to determine next steps.  -     Anemia Profile B -     TSH  Rash Will start medication as below. Discussed at home care with patient.  -     ketoconazole (NIZORAL) 2 % cream; Apply 1 Application topically daily.  Continue all other maintenance medications.  Follow up plan: Return in about 3 months (around 10/08/2023) for Chronic Condition Follow up.  Continue healthy lifestyle choices, including diet (rich in fruits, vegetables, and lean proteins, and low in salt and simple carbohydrates) and exercise (at least 30 minutes of moderate physical activity daily).  Written and verbal instructions provided   The above assessment and management plan was discussed with the patient. The patient verbalized understanding of and has agreed to the management plan. Patient is aware to call the clinic if they develop any new symptoms or if symptoms persist or worsen. Patient is aware when to return to the clinic for a follow-up visit. Patient educated on when it is appropriate to go to the emergency department.   Neale Burly, DNP-FNP [facility] [address] [place],  [place] [address] [phone]

## 2023-07-12 ENCOUNTER — Encounter: Payer: Self-pay | Admitting: Family Medicine

## 2023-07-12 LAB — ANEMIA PROFILE B
Basophils Absolute: 0 10*3/uL (ref 0.0–0.2)
Basos: 0 %
EOS (ABSOLUTE): 0.2 10*3/uL (ref 0.0–0.4)
Eos: 3 %
Ferritin: 71 ng/mL (ref 15–150)
Folate: 8.2 ng/mL (ref >3.0)
Hematocrit: 37.8 % (ref 34.0–46.6)
Hemoglobin: 12.3 g/dL (ref 11.1–15.9)
Immature Grans (Abs): 0 10*3/uL (ref 0.0–0.1)
Immature Granulocytes: 0 %
Iron Saturation: 25 % (ref 15–55)
Iron: 75 ug/dL (ref 27–139)
Lymphocytes Absolute: 0.8 10*3/uL (ref 0.7–3.1)
Lymphs: 15 %
MCH: 28.2 pg (ref 26.6–33.0)
MCHC: 32.5 g/dL (ref 31.5–35.7)
MCV: 87 fL (ref 79–97)
Monocytes Absolute: 0.6 10*3/uL (ref 0.1–0.9)
Monocytes: 10 %
Neutrophils Absolute: 4.1 10*3/uL (ref 1.4–7.0)
Neutrophils: 72 %
Platelets: 267 10*3/uL (ref 150–450)
RBC: 4.36 x10E6/uL (ref 3.77–5.28)
RDW: 12.6 % (ref 11.7–15.4)
Retic Ct Pct: 0.9 % (ref 0.6–2.6)
Total Iron Binding Capacity: 305 ug/dL (ref 250–450)
UIBC: 230 ug/dL (ref 118–369)
Vitamin B-12: 162 pg/mL — ABNORMAL LOW (ref 232–1245)
WBC: 5.7 10*3/uL (ref 3.4–10.8)

## 2023-07-12 LAB — CMP14+EGFR
ALT: 12 [IU]/L (ref 0–32)
AST: 15 [IU]/L (ref 0–40)
Albumin: 4.1 g/dL (ref 3.9–4.9)
Alkaline Phosphatase: 124 [IU]/L — ABNORMAL HIGH (ref 44–121)
BUN/Creatinine Ratio: 11 — ABNORMAL LOW (ref 12–28)
BUN: 8 mg/dL (ref 8–27)
Bilirubin Total: 0.4 mg/dL (ref 0.0–1.2)
CO2: 25 mmol/L (ref 20–29)
Calcium: 8.9 mg/dL (ref 8.7–10.3)
Chloride: 99 mmol/L (ref 96–106)
Creatinine, Ser: 0.7 mg/dL (ref 0.57–1.00)
Globulin, Total: 2.8 g/dL (ref 1.5–4.5)
Glucose: 86 mg/dL (ref 70–99)
Potassium: 4.5 mmol/L (ref 3.5–5.2)
Sodium: 137 mmol/L (ref 134–144)
Total Protein: 6.9 g/dL (ref 6.0–8.5)
eGFR: 97 mL/min/{1.73_m2} (ref >59)

## 2023-07-12 LAB — VITAMIN D 25 HYDROXY (VIT D DEFICIENCY, FRACTURES): Vit D, 25-Hydroxy: 14.7 ng/mL — ABNORMAL LOW (ref 30.0–100.0)

## 2023-07-12 LAB — TSH: TSH: 0.79 u[IU]/mL (ref 0.450–4.500)

## 2023-07-12 MED ORDER — VITAMIN D (ERGOCALCIFEROL) 1.25 MG (50000 UNIT) PO CAPS: 50000.0000 [IU] | ORAL_CAPSULE | ORAL | 0 refills | Status: AC

## 2023-07-12 NOTE — Progress Notes (Signed)
 Vitamin B12 slightly low. Can start OTC supplement 1,000 to 2,000 mcg once daily. Can receive IM injections if patient would prefer. Vitamin D level is low. I have sent in a weekly supplement to take for the next 12 weeks. After that, take a daily OTC vitamin D supplement with 1000-2000 IU. Slightly elevated alk phos, most likely due to nonfasting status.

## 2023-07-12 NOTE — Addendum Note (Signed)
 Addended by: Neale Burly on: 07/12/2023 12:36 PM   Modules accepted: Orders

## 2023-07-16 DIAGNOSIS — U071 COVID-19: Secondary | ICD-10-CM | POA: Diagnosis not present

## 2023-08-03 DIAGNOSIS — N3001 Acute cystitis with hematuria: Secondary | ICD-10-CM | POA: Diagnosis not present

## 2023-08-03 DIAGNOSIS — R309 Painful micturition, unspecified: Secondary | ICD-10-CM | POA: Diagnosis not present

## 2023-08-09 ENCOUNTER — Emergency Department (HOSPITAL_COMMUNITY)

## 2023-08-09 ENCOUNTER — Encounter (HOSPITAL_COMMUNITY): Payer: Self-pay | Admitting: Emergency Medicine

## 2023-08-09 ENCOUNTER — Emergency Department (HOSPITAL_COMMUNITY)
Admission: EM | Admit: 2023-08-09 | Discharge: 2023-08-09 | Disposition: A | Discharge status: Home / Self Care | Attending: Emergency Medicine | Admitting: Emergency Medicine

## 2023-08-09 ENCOUNTER — Other Ambulatory Visit: Payer: Self-pay

## 2023-08-09 DIAGNOSIS — Z87442 Personal history of urinary calculi: Secondary | ICD-10-CM | POA: Insufficient documentation

## 2023-08-09 DIAGNOSIS — R300 Dysuria: Secondary | ICD-10-CM | POA: Insufficient documentation

## 2023-08-09 DIAGNOSIS — Z7982 Long term (current) use of aspirin: Secondary | ICD-10-CM | POA: Insufficient documentation

## 2023-08-09 DIAGNOSIS — K5750 Diverticulosis of both small and large intestine without perforation or abscess without bleeding: Secondary | ICD-10-CM | POA: Diagnosis not present

## 2023-08-09 DIAGNOSIS — R110 Nausea: Secondary | ICD-10-CM | POA: Diagnosis not present

## 2023-08-09 DIAGNOSIS — R350 Frequency of micturition: Secondary | ICD-10-CM | POA: Diagnosis present

## 2023-08-09 DIAGNOSIS — R109 Unspecified abdominal pain: Secondary | ICD-10-CM | POA: Insufficient documentation

## 2023-08-09 DIAGNOSIS — K429 Umbilical hernia without obstruction or gangrene: Secondary | ICD-10-CM | POA: Diagnosis not present

## 2023-08-09 LAB — URINALYSIS, W/ REFLEX TO CULTURE (INFECTION SUSPECTED)
Bacteria, UA: NONE SEEN
Bilirubin Urine: NEGATIVE
Glucose, UA: NEGATIVE mg/dL
Ketones, ur: NEGATIVE mg/dL
Leukocytes,Ua: NEGATIVE
Nitrite: NEGATIVE
Protein, ur: NEGATIVE mg/dL
Specific Gravity, Urine: 1.005 (ref 1.005–1.030)
pH: 5 (ref 5.0–8.0)

## 2023-08-09 MED ORDER — PHENAZOPYRIDINE HCL 200 MG PO TABS: 200.0000 mg | ORAL_TABLET | Freq: Three times a day (TID) | ORAL | 0 refills | Status: DC

## 2023-08-09 NOTE — ED Provider Notes (Signed)
 [place] EMERGENCY DEPARTMENT AT [facility] Provider Note   CSN: [id number] Arrival date & time: 08/09/23  1118     History  Chief Complaint  Patient presents with   Urinary Tract Infection    Kaitlin Fleming is a 63 y.o. female presenting for evaluation of UTI symptoms.  She was seen at an urgent care center 6 days ago and was diagnosed with UTI at which time she was given a one-time dose of fosfomycin.  She was called and advised to come to the emergency room today as she reports her urine culture tested positive for Klebsiella in her urine.  She continues to have mild suprapubic fullness sensation, she denies fevers or chills, dysuria but does have urinary frequency.  Also endorses nausea without emesis and has been able to tolerate p.o. intake.  She has a history of kidney stones, was last told she had a large stone in one of her kidneys, this was about 10 years ago, she is never had any treatment for this nor has she passed a stone to her knowledge.  She does have some mild achiness in her right flank region.  She has had no other treatment for symptoms.    The history is provided by the patient.       Home Medications Prior to Admission medications   Medication Sig Start Date End Date Taking? Authorizing Provider  phenazopyridine (PYRIDIUM) 200 MG tablet Take 1 tablet (200 mg total) by mouth 3 (three) times daily. 08/09/23  Yes Ahmari Garton, Raynelle Fanning, PA-C  predniSONE (DELTASONE) 20 MG tablet Take 20 mg by mouth 2 (two) times daily. 07/16/23  Yes [provider]  aspirin EC 81 MG tablet Take 81 mg by mouth daily. Swallow whole.    [provider]  busPIRone (BUSPAR) 5 MG tablet TAKE ONE (1) TABLET BY MOUTH TWO (2) TIMES DAILY 04/03/23   Milian, Aleen Campi, FNP  cholestyramine (QUESTRAN) 4 g packet Take 1 packet (4 g total) by mouth daily. 04/02/23   MilianAleen Campi, FNP  EPINEPHrine 0.3 mg/0.3 mL IJ SOAJ injection Inject 0.3 mg into the muscle as  needed for anaphylaxis. 02/19/22   Birder Robson, MD  ezetimibe (ZETIA) 10 MG tablet Take 1 tablet (10 mg total) by mouth daily. 07/11/23   Milian, Aleen Campi, FNP  fosfomycin (MONUROL) 3 g PACK Take 3 g by mouth once. 08/03/23   [provider]  ketoconazole (NIZORAL) 2 % cream Apply 1 Application topically daily. 07/11/23   Milian, Aleen Campi, FNP  lisinopril (ZESTRIL) 5 MG tablet Take 1 tablet (5 mg total) by mouth daily. 07/11/23   Milian, Aleen Campi, FNP  loratadine (CLARITIN) 10 MG tablet Take 1 tablet (10 mg total) by mouth at bedtime. 08/04/17   Vassie Loll, MD  ondansetron (ZOFRAN-ODT) 4 MG disintegrating tablet Take 1 tablet (4 mg total) by mouth every 8 (eight) hours as needed. 06/09/23   Jacalyn Lefevre, MD  Oxycodone HCl 10 MG TABS Take 10 mg by mouth 4 (four) times daily as needed. 07/10/23   [provider]  pantoprazole (PROTONIX) 40 MG tablet Take 1 tablet (40 mg total) by mouth 2 (two) times daily before a meal. 07/11/23   Milian, Aleen Campi, FNP  sertraline (ZOLOFT) 50 MG tablet Take 1 tablet (50 mg total) by mouth daily. 08/19/19   Hawks, Neysa Bonito A, FNP  Upadacitinib ER (RINVOQ) 15 MG TB24 Take 1 tablet by mouth daily. 11/16/19   [provider]  valACYclovir Ralph Dowdy)  1000 MG tablet Take 1 tablet (1,000 mg total) by mouth 2 (two) times daily. 04/02/23   Milian, Aleen Campi, FNP  Vitamin D, Ergocalciferol, (DRISDOL) 1.25 MG (50000 UNIT) CAPS capsule Take 1 capsule (50,000 Units total) by mouth every 7 (seven) days for 12 doses. 07/12/23 09/28/23  Arrie Senate, FNP      Allergies    Alpha-gal, Macrobid [nitrofurantoin], Pravastatin, Macrolides and ketolides, Methotrexate, Prevacid [lansoprazole], Remicade [infliximab], Tofacitinib, Doxycycline, Ibuprofen, Keflex [cephalexin], Penicillins, and Sulfa antibiotics    Review of Systems   Review of Systems  Constitutional:  Negative for chills and fever.  HENT:  Negative for  congestion and sore throat.   Eyes: Negative.   Respiratory:  Negative for chest tightness and shortness of breath.   Cardiovascular:  Negative for chest pain.  Gastrointestinal:  Negative for abdominal pain and nausea.  Genitourinary:  Positive for flank pain and frequency. Negative for dysuria and urgency.  Musculoskeletal:  Negative for arthralgias, joint swelling and neck pain.  Skin: Negative.  Negative for rash and wound.  Neurological:  Negative for dizziness, weakness, light-headedness, numbness and headaches.  Psychiatric/Behavioral: Negative.      Physical Exam Updated Vital Signs BP 121/60 (BP Location: Right Arm)   Pulse 78   Temp 98.3 F (36.8 C) (Oral)   Resp 14   Ht 5\' 7"  (1.702 m)   Wt 74.8 kg   SpO2 100%   BMI 25.84 kg/m  Physical Exam Vitals and nursing note reviewed.  Constitutional:      Appearance: She is well-developed.  HENT:     Head: Normocephalic and atraumatic.  Eyes:     Conjunctiva/sclera: Conjunctivae normal.  Cardiovascular:     Rate and Rhythm: Normal rate and regular rhythm.     Heart sounds: Normal heart sounds.  Pulmonary:     Effort: Pulmonary effort is normal.     Breath sounds: Normal breath sounds. No wheezing.  Abdominal:     General: Bowel sounds are normal.     Palpations: Abdomen is soft.     Tenderness: There is no abdominal tenderness. There is no right CVA tenderness, left CVA tenderness or guarding.  Musculoskeletal:        General: Normal range of motion.     Cervical back: Normal range of motion.  Skin:    General: Skin is warm and dry.  Neurological:     Mental Status: She is alert.     ED Results / Procedures / Treatments   Labs (all labs ordered are listed, but only abnormal results are displayed) Labs Reviewed  URINALYSIS, W/ REFLEX TO CULTURE (INFECTION SUSPECTED) - Abnormal; Notable for the following components:      Result Value   Hgb urine dipstick SMALL (*)    All other components within normal limits     EKG None  Radiology CT Renal Stone Study Result Date: 08/09/2023 CLINICAL DATA:  Abdominal/flank pain, stone suspected EXAM: CT ABDOMEN AND PELVIS WITHOUT CONTRAST TECHNIQUE: Multidetector CT imaging of the abdomen and pelvis was performed following the standard protocol without IV contrast. RADIATION DOSE REDUCTION: This exam was performed according to the departmental dose-optimization program which includes automated exposure control, adjustment of the mA and/or kV according to patient size and/or use of iterative reconstruction technique. COMPARISON:  CT scan abdomen and pelvis from 06/09/2023. FINDINGS: Lower chest: There are dependent changes in the visualized lung bases. No overt consolidation. No pleural effusion. The heart is normal in size. No pericardial effusion. There are  coronary artery atherosclerotic calcifications, in keeping with coronary artery disease. Hepatobiliary: The liver is normal in size. Non-cirrhotic configuration. No suspicious mass. No intrahepatic or extrahepatic bile duct dilation. No calcified gallstones. Normal gallbladder wall thickness. No pericholecystic inflammatory changes. Pancreas: Unremarkable. No pancreatic ductal dilatation or surrounding inflammatory changes. Spleen: Within normal limits. No focal lesion. Adrenals/Urinary Tract: Adrenal glands are unremarkable. No suspicious renal mass. No hydronephrosis. No renal or ureteric calculi. Unremarkable urinary bladder. Stomach/Bowel: There is a small diverticulum arising from the third part of duodenum. No disproportionate dilation of the small or large bowel loops. No evidence of abnormal bowel wall thickening or inflammatory changes. The appendix is unremarkable. There are scattered diverticula mainly in the sigmoid colon, without imaging signs of diverticulitis. Vascular/Lymphatic: No ascites or pneumoperitoneum. No abdominal or pelvic lymphadenopathy, by size criteria. No aneurysmal dilation of the major  abdominal arteries. There are mild peripheral atherosclerotic vascular calcifications of the aorta and its major branches. Reproductive: The uterus is surgically absent. No large adnexal mass. Other: There is a tiny fat containing umbilical hernia. The soft tissues and abdominal wall are otherwise unremarkable. Musculoskeletal: No suspicious osseous lesions. There are moderate multilevel degenerative changes in the visualized spine. L4 through S1 posterior spinal fixation noted. There is mild superior endplate compression deformity of L1 vertebral body, unchanged. No significant retropulsion or spinal canal compromise. Neurostimulator device noted with its leads terminating around the metallic hardware. IMPRESSION: 1. No nephroureterolithiasis or obstructive uropathy. No acute inflammatory process identified within the abdomen or pelvis. 2. Multiple other nonacute observations, as described above. Aortic Atherosclerosis (ICD10-I70.0). Electronically Signed   By: Jules Schick M.D.   On: 08/09/2023 13:53    Procedures Procedures    Medications Ordered in ED Medications - No data to display  ED Course/ Medical Decision Making/ A&P                                 Medical Decision Making Patient was treated 1 week ago for UTI with fosfomycin, contacted to present here secondary to positive urine culture, however her urinalysis today is negative, it appears that the fosfomycin has been effective for this infection.  She does still have complaints of a mild pressure sensation in the suprapubic region.  With her history of kidney stones, CT imaging was completed to rule out stone complication and is negative.  She will be placed on a 3-day course of Pyridium for symptom relief.  No indication for additional antibiotics at this time.  She was advised to follow-up with the primary doctor if her symptoms are not improving with this new medication.  Advised that this will turn her urine bright colors.  Return  precautions were outlined including fever, vomiting,  Amount and/or Complexity of Data Reviewed Labs: ordered.    Details: UA, negative Radiology: ordered.    Details: CT renal study negative for acute findings.  Risk Prescription drug management.           Final Clinical Impression(s) / ED Diagnoses Final diagnoses:  Dysuria    Rx / DC Orders ED Discharge Orders          Ordered    phenazopyridine (PYRIDIUM) 200 MG tablet  3 times daily        08/09/23 1424              Burgess Amor, Cordelia Poche 08/09/23 1436    Terrilee Files, MD 08/09/23 [phone]

## 2023-08-09 NOTE — ED Triage Notes (Signed)
 Pt bib pov w/ c/o a UTI. Pt went to UC last Saturday. She was given what she has described as a 1 time dose abx that was dissolved in water (fosfomycin tromethamine) Pt reports she was called by UC yesterday and instructed to come to the ED due to 3 types of bacteria that were found in her urine. Pt reports she has several drug allergies and she also has alfa-gal and rheumatoid arthritis.  Pt also reports pain in her suprapubic region, occasional pain on her right flank, and nausea.

## 2023-08-09 NOTE — Discharge Instructions (Signed)
 Your urine test is negative for any sign of persistent urinary tract infection.  I suspect that 1 time antibiotic dose she received last week cleared your infection.  Your CT scan is also negative for kidney stones.  I have prescribed you medicine to help you with the urinary urgent see symptom to see if this will clear, be aware that this medication will turn your urine bright yellow-orange and is normal.  Follow-up with your primary doctor if your symptoms do not improve with this treatment plan.

## 2023-08-14 ENCOUNTER — Other Ambulatory Visit: Payer: Self-pay

## 2023-08-14 ENCOUNTER — Encounter (HOSPITAL_COMMUNITY): Payer: Self-pay

## 2023-08-14 ENCOUNTER — Emergency Department (HOSPITAL_COMMUNITY)

## 2023-08-14 ENCOUNTER — Emergency Department (HOSPITAL_COMMUNITY): Admission: EM | Admit: 2023-08-14 | Discharge: 2023-08-14 | Disposition: A | Discharge status: Home / Self Care

## 2023-08-14 DIAGNOSIS — U071 COVID-19: Secondary | ICD-10-CM | POA: Insufficient documentation

## 2023-08-14 DIAGNOSIS — Z7982 Long term (current) use of aspirin: Secondary | ICD-10-CM | POA: Insufficient documentation

## 2023-08-14 DIAGNOSIS — R9389 Abnormal findings on diagnostic imaging of other specified body structures: Secondary | ICD-10-CM | POA: Diagnosis not present

## 2023-08-14 DIAGNOSIS — R0602 Shortness of breath: Secondary | ICD-10-CM | POA: Diagnosis not present

## 2023-08-14 DIAGNOSIS — R059 Cough, unspecified: Secondary | ICD-10-CM | POA: Diagnosis not present

## 2023-08-14 LAB — RESP PANEL BY RT-PCR (RSV, FLU A&B, COVID)  RVPGX2
Influenza A by PCR: NEGATIVE
Influenza B by PCR: NEGATIVE
Resp Syncytial Virus by PCR: NEGATIVE
SARS Coronavirus 2 by RT PCR: POSITIVE — AB

## 2023-08-14 MED ORDER — ALBUTEROL SULFATE (2.5 MG/3ML) 0.083% IN NEBU: 2.5000 mg | INHALATION_SOLUTION | Freq: Four times a day (QID) | RESPIRATORY_TRACT | 12 refills | Status: DC | PRN

## 2023-08-14 MED ORDER — PAXLOVID (300/100) 20 X 150 MG & 10 X 100MG PO TBPK: 3.0000 | ORAL_TABLET | Freq: Two times a day (BID) | ORAL | 0 refills | Status: DC

## 2023-08-14 MED ORDER — PREDNISONE 10 MG PO TABS
60.0000 mg | ORAL_TABLET | Freq: Once | ORAL | Status: AC
  Administered 2023-08-14: 10 mg via ORAL
  Filled 2023-08-14: qty 1

## 2023-08-14 MED ORDER — BENZONATATE 100 MG PO CAPS: 100.0000 mg | ORAL_CAPSULE | Freq: Three times a day (TID) | ORAL | 0 refills | Status: DC

## 2023-08-14 MED ORDER — IPRATROPIUM-ALBUTEROL 0.5-2.5 (3) MG/3ML IN SOLN
3.0000 mL | Freq: Once | RESPIRATORY_TRACT | Status: AC
  Administered 2023-08-14: 3 mL via RESPIRATORY_TRACT

## 2023-08-14 NOTE — ED Notes (Signed)
 Respiratory called to assess pt and give duoneb

## 2023-08-14 NOTE — ED Notes (Signed)
 Pt notified in triage of situation in the back and that we're working on getting rooms open. We're simply waiting for rooms to come open.

## 2023-08-14 NOTE — ED Provider Notes (Signed)
  EMERGENCY DEPARTMENT AT [facility] Provider Note   CSN: [id number] Arrival date & time: 08/14/23  1600     History  Chief Complaint  Patient presents with   Cough    Kaitlin Fleming is a 63 y.o. female.  63 year old female with past medical history of hyperlipidemia presenting to the emergency department today with cough and congestion.  The patient states has been going now for the past few days.  She states that she was having some wheezing today as well.  She came to the ER for further evaluation regarding this.  She denies any chest pain.  Denies any significant shortness of breath with this.  She came to the ER for further evaluation.  She states that she has had COVID and flu within the past 2 months.  This does feel somewhat similar.   Cough      Home Medications Prior to Admission medications   Medication Sig Start Date End Date Taking? Authorizing Provider  albuterol (PROVENTIL) (2.5 MG/3ML) 0.083% nebulizer solution Take 3 mLs (2.5 mg total) by nebulization every 6 (six) hours as needed for wheezing or shortness of breath. 08/14/23  Yes Durwin Glaze, MD  benzonatate (TESSALON) 100 MG capsule Take 1 capsule (100 mg total) by mouth every 8 (eight) hours. 08/14/23  Yes Durwin Glaze, MD  nirmatrelvir/ritonavir (PAXLOVID, 300/100,) 20 x 150 MG & 10 x 100MG  TBPK Take 3 tablets by mouth 2 (two) times daily for 5 days. Patient GFR is 60. Take nirmatrelvir (150 mg) two tablets twice daily for 5 days and ritonavir (100 mg) one tablet twice daily for 5 days. 08/14/23 08/19/23 Yes Durwin Glaze, MD  aspirin EC 81 MG tablet Take 81 mg by mouth daily. Swallow whole.    [provider]  busPIRone (BUSPAR) 5 MG tablet TAKE ONE (1) TABLET BY MOUTH TWO (2) TIMES DAILY 04/03/23   Milian, Aleen Campi, FNP  cholestyramine (QUESTRAN) 4 g packet Take 1 packet (4 g total) by mouth daily. 04/02/23   MilianAleen Campi, FNP  EPINEPHrine 0.3 mg/0.3 mL IJ SOAJ  injection Inject 0.3 mg into the muscle as needed for anaphylaxis. 02/19/22   Birder Robson, MD  ezetimibe (ZETIA) 10 MG tablet Take 1 tablet (10 mg total) by mouth daily. 07/11/23   Milian, Aleen Campi, FNP  fosfomycin (MONUROL) 3 g PACK Take 3 g by mouth once. 08/03/23   [provider]  ketoconazole (NIZORAL) 2 % cream Apply 1 Application topically daily. 07/11/23   Milian, Aleen Campi, FNP  lisinopril (ZESTRIL) 5 MG tablet Take 1 tablet (5 mg total) by mouth daily. 07/11/23   Milian, Aleen Campi, FNP  loratadine (CLARITIN) 10 MG tablet Take 1 tablet (10 mg total) by mouth at bedtime. 08/04/17   Vassie Loll, MD  ondansetron (ZOFRAN-ODT) 4 MG disintegrating tablet Take 1 tablet (4 mg total) by mouth every 8 (eight) hours as needed. 06/09/23   Jacalyn Lefevre, MD  Oxycodone HCl 10 MG TABS Take 10 mg by mouth 4 (four) times daily as needed. 07/10/23   [provider]  pantoprazole (PROTONIX) 40 MG tablet Take 1 tablet (40 mg total) by mouth 2 (two) times daily before a meal. 07/11/23   Milian, Aleen Campi, FNP  phenazopyridine (PYRIDIUM) 200 MG tablet Take 1 tablet (200 mg total) by mouth 3 (three) times daily. 08/09/23   Burgess Amor, PA-C  predniSONE (DELTASONE) 20 MG tablet Take 20 mg by mouth 2 (two) times daily. 07/16/23  [provider]  sertraline (ZOLOFT) 50 MG tablet Take 1 tablet (50 mg total) by mouth daily. 08/19/19   Hawks, Neysa Bonito A, FNP  Upadacitinib ER (RINVOQ) 15 MG TB24 Take 1 tablet by mouth daily. 11/16/19   [provider]  valACYclovir (VALTREX) 1000 MG tablet Take 1 tablet (1,000 mg total) by mouth 2 (two) times daily. 04/02/23   Milian, Aleen Campi, FNP  Vitamin D, Ergocalciferol, (DRISDOL) 1.25 MG (50000 UNIT) CAPS capsule Take 1 capsule (50,000 Units total) by mouth every 7 (seven) days for 12 doses. 07/12/23 09/28/23  Arrie Senate, FNP      Allergies    Alpha-gal, Macrobid [nitrofurantoin], Pravastatin, Macrolides and  ketolides, Methotrexate, Prevacid [lansoprazole], Remicade [infliximab], Tofacitinib, Doxycycline, Ibuprofen, Keflex [cephalexin], Penicillins, and Sulfa antibiotics    Review of Systems   Review of Systems  Respiratory:  Positive for cough.   All other systems reviewed and are negative.   Physical Exam Updated Vital Signs BP (!) 156/71   Pulse 80   Temp 99.3 F (37.4 C) (Oral)   Resp 18   Ht 5\' 7"  (1.702 m)   Wt 74.8 kg   SpO2 96%   BMI 25.83 kg/m  Physical Exam Vitals and nursing note reviewed.   Gen: NAD Eyes: PERRL, EOMI HEENT: no oropharyngeal swelling Neck: trachea midline Resp: clear to auscultation bilaterally Card: RRR, no murmurs, rubs, or gallops Abd: nontender, nondistended Extremities: no calf tenderness, no edema Vascular: 2+ radial pulses bilaterally, 2+ DP pulses bilaterally Skin: no rashes Psyc: acting appropriately   ED Results / Procedures / Treatments   Labs (all labs ordered are listed, but only abnormal results are displayed) Labs Reviewed  RESP PANEL BY RT-PCR (RSV, FLU A&B, COVID)  [id number] - Abnormal; Notable for the following components:      Result Value   SARS Coronavirus 2 by RT PCR POSITIVE (*)    All other components within normal limits    EKG None  Radiology DG Chest 2 View Result Date: 08/14/2023 CLINICAL DATA:  Shortness of breath.  Cough. EXAM: CHEST - 2 VIEW COMPARISON:  06/19/2023. FINDINGS: Bilateral lung fields are clear. Bilateral costophrenic angles are clear. Redemonstration of moderately elevated right hemidiaphragm. Normal cardio-mediastinal silhouette. No acute osseous abnormalities. The soft tissues are within normal limits. IMPRESSION: No active cardiopulmonary disease. Electronically Signed   By: Jules Schick M.D.   On: 08/14/2023 18:05    Procedures Procedures    Medications Ordered in ED Medications  ipratropium-albuterol (DUONEB) 0.5-2.5 (3) MG/3ML nebulizer solution 3 mL (has no administration in time  range)  predniSONE (DELTASONE) tablet 60 mg (10 mg Oral Given 08/14/23 2142)    ED Course/ Medical Decision Making/ A&P                                 Medical Decision Making 63 year old female presenting to the emergency department today with URI symptoms.  She is otherwise well-appearing with stable vital signs.  Denies any associated chest pain or shortness of breath.  Suspicion for ACS is low at this time.  I will further evaluate patient here with a chest x-ray as well as a COVID and flu swab.  Will reevaluate for ultimate disposition.  The patient's COVID test is positive which certainly does fit with her symptoms.  Chest x-ray is unremarkable.  She is discharged with return precautions.  Amount and/or Complexity of Data Reviewed Radiology: ordered.  Risk Prescription  drug management.           Final Clinical Impression(s) / ED Diagnoses Final diagnoses:  COVID-19    Rx / DC Orders ED Discharge Orders          Ordered    nirmatrelvir/ritonavir (PAXLOVID, 300/100,) 20 x 150 MG & 10 x 100MG  TBPK  2 times daily        08/14/23 2213    albuterol (PROVENTIL) (2.5 MG/3ML) 0.083% nebulizer solution  Every 6 hours PRN        08/14/23 2213    benzonatate (TESSALON) 100 MG capsule  Every 8 hours        08/14/23 2213              Durwin Glaze, MD 08/14/23 2213

## 2023-08-14 NOTE — ED Triage Notes (Signed)
 Pt arrived via POV c/o cough and wheezing since Saturday. Pt reports recent UTI, recent URI.

## 2023-08-14 NOTE — Discharge Instructions (Signed)
 Looks like your COVID test is positive.  Please use the nebulizer solution as needed for cough or wheezing.  Use the Tessalon for cough.  Start the Paxlovid.  Return to the ER for worsening symptoms.

## 2023-08-15 ENCOUNTER — Telehealth (HOSPITAL_COMMUNITY): Payer: Self-pay | Admitting: Emergency Medicine

## 2023-08-15 ENCOUNTER — Telehealth: Payer: Self-pay | Admitting: Family Medicine

## 2023-08-15 DIAGNOSIS — U071 COVID-19: Secondary | ICD-10-CM

## 2023-08-15 MED ORDER — PAXLOVID (300/100) 20 X 150 MG & 10 X 100MG PO TBPK: 3.0000 | ORAL_TABLET | Freq: Two times a day (BID) | ORAL | 0 refills | Status: DC

## 2023-08-15 NOTE — Telephone Encounter (Signed)
 Rx written and faxed to [facility] pharmacy per pt request

## 2023-08-15 NOTE — Telephone Encounter (Signed)
 Patient states her pharmacy never received prescription for Paxlovid

## 2023-08-15 NOTE — Telephone Encounter (Signed)
 Copied from CRM [id number]. Topic: Clinical - Prescription Issue >> Aug 15, 2023 12:44 PM Geroge Baseman wrote: Reason for CRM: patient needs a new nebulizer .she said she is very wheezy. Did not want to speak with the nurse. She would like to see if she can get a prescription sent over for a new one ASAP so she can use her treatments. she has been diagnosed with covid. Please call ASAP to advise.

## 2023-08-15 NOTE — Telephone Encounter (Signed)
 Sent in order for patient given ED visit yesterday for covid.

## 2023-08-15 NOTE — Telephone Encounter (Signed)
 Copied from CRM [id number]. Topic: Clinical - Prescription Issue >> Aug 15, 2023 12:44 PM Geroge Baseman wrote: Reason for CRM: patient needs a new nebulizer .she said she is very wheezy. Did not want to speak with the nurse. She would like to see if she can get a prescription sent over for a new one ASAP so she can use her treatments. she has been diagnosed with covid. Please call ASAP to advise. >> Aug 15, 2023  3:27 PM Emylou G wrote: Adv patient no recent notes on msg yet.. to allow time for them to review.. She said this is really urgent for her.

## 2023-08-15 NOTE — Telephone Encounter (Signed)
 Closing encounter, duplicate request, other request has already been sent to PCP

## 2023-08-16 ENCOUNTER — Telehealth (HOSPITAL_COMMUNITY): Payer: Self-pay | Admitting: Emergency Medicine

## 2023-08-16 MED ORDER — PAXLOVID (300/100) 20 X 150 MG & 10 X 100MG PO TBPK: 3.0000 | ORAL_TABLET | Freq: Two times a day (BID) | ORAL | 0 refills | Status: AC

## 2023-08-16 NOTE — Telephone Encounter (Signed)
 Left detailed vm that neb was resent

## 2023-08-16 NOTE — Telephone Encounter (Signed)
 Pt says [facility] never received the Nebulizer Rx. Wants Rx sent to [facility] pharmacy because [facility] pharmacy has been rude to her.

## 2023-08-16 NOTE — Telephone Encounter (Cosign Needed)
 Prescription for Paxlovid sent to patient's pharmacy of record.  I was notified that previous Paxlovid prescription was not received at the pharmacy

## 2023-08-16 NOTE — Telephone Encounter (Signed)
 Message ment for another provider. Medication called in by that provider yesterday. LS

## 2023-08-16 NOTE — Addendum Note (Signed)
 Addended by: Neale Burly on: 08/16/2023 12:27 PM   Modules accepted: Orders

## 2023-08-20 DIAGNOSIS — Z79891 Long term (current) use of opiate analgesic: Secondary | ICD-10-CM | POA: Diagnosis not present

## 2023-08-20 DIAGNOSIS — M4326 Fusion of spine, lumbar region: Secondary | ICD-10-CM | POA: Diagnosis not present

## 2023-08-20 DIAGNOSIS — G894 Chronic pain syndrome: Secondary | ICD-10-CM | POA: Diagnosis not present

## 2023-08-20 DIAGNOSIS — M5416 Radiculopathy, lumbar region: Secondary | ICD-10-CM | POA: Diagnosis not present

## 2023-08-30 ENCOUNTER — Other Ambulatory Visit: Payer: Self-pay | Admitting: Family Medicine

## 2023-08-30 DIAGNOSIS — K2100 Gastro-esophageal reflux disease with esophagitis, without bleeding: Secondary | ICD-10-CM

## 2023-09-18 ENCOUNTER — Other Ambulatory Visit: Payer: Self-pay | Admitting: Family Medicine

## 2023-09-18 DIAGNOSIS — B001 Herpesviral vesicular dermatitis: Secondary | ICD-10-CM

## 2023-10-08 ENCOUNTER — Ambulatory Visit: Payer: Medicaid Other | Admitting: Family Medicine

## 2023-10-15 ENCOUNTER — Ambulatory Visit: Admitting: Family Medicine

## 2023-10-15 DIAGNOSIS — M5416 Radiculopathy, lumbar region: Secondary | ICD-10-CM | POA: Diagnosis not present

## 2023-10-15 DIAGNOSIS — G894 Chronic pain syndrome: Secondary | ICD-10-CM | POA: Diagnosis not present

## 2023-10-15 DIAGNOSIS — Z79891 Long term (current) use of opiate analgesic: Secondary | ICD-10-CM | POA: Diagnosis not present

## 2023-10-15 DIAGNOSIS — M4326 Fusion of spine, lumbar region: Secondary | ICD-10-CM | POA: Diagnosis not present

## 2023-10-17 ENCOUNTER — Encounter: Payer: Self-pay | Admitting: Family Medicine

## 2023-10-17 ENCOUNTER — Ambulatory Visit: Admitting: Family Medicine

## 2023-10-29 DIAGNOSIS — L578 Other skin changes due to chronic exposure to nonionizing radiation: Secondary | ICD-10-CM | POA: Diagnosis not present

## 2023-10-29 DIAGNOSIS — L814 Other melanin hyperpigmentation: Secondary | ICD-10-CM | POA: Diagnosis not present

## 2023-10-29 DIAGNOSIS — L821 Other seborrheic keratosis: Secondary | ICD-10-CM | POA: Diagnosis not present

## 2023-10-29 DIAGNOSIS — D225 Melanocytic nevi of trunk: Secondary | ICD-10-CM | POA: Diagnosis not present

## 2023-10-30 DIAGNOSIS — Z79899 Other long term (current) drug therapy: Secondary | ICD-10-CM | POA: Diagnosis not present

## 2023-10-30 DIAGNOSIS — M0579 Rheumatoid arthritis with rheumatoid factor of multiple sites without organ or systems involvement: Secondary | ICD-10-CM | POA: Diagnosis not present

## 2023-10-30 DIAGNOSIS — M8589 Other specified disorders of bone density and structure, multiple sites: Secondary | ICD-10-CM | POA: Diagnosis not present

## 2023-10-30 DIAGNOSIS — M8588 Other specified disorders of bone density and structure, other site: Secondary | ICD-10-CM | POA: Diagnosis not present

## 2023-11-07 ENCOUNTER — Ambulatory Visit: Admitting: Family Medicine

## 2023-11-07 ENCOUNTER — Encounter: Payer: Self-pay | Admitting: Family Medicine

## 2023-11-07 ENCOUNTER — Ambulatory Visit: Payer: Self-pay | Admitting: Family Medicine

## 2023-11-07 ENCOUNTER — Telehealth: Payer: Self-pay

## 2023-11-07 VITALS — BP 134/73 | HR 72 | Temp 98.0°F | Ht 67.0 in | Wt 172.0 lb

## 2023-11-07 DIAGNOSIS — I10 Essential (primary) hypertension: Secondary | ICD-10-CM

## 2023-11-07 DIAGNOSIS — R300 Dysuria: Secondary | ICD-10-CM | POA: Diagnosis not present

## 2023-11-07 DIAGNOSIS — E559 Vitamin D deficiency, unspecified: Secondary | ICD-10-CM | POA: Diagnosis not present

## 2023-11-07 DIAGNOSIS — R0789 Other chest pain: Secondary | ICD-10-CM | POA: Diagnosis not present

## 2023-11-07 DIAGNOSIS — F324 Major depressive disorder, single episode, in partial remission: Secondary | ICD-10-CM

## 2023-11-07 DIAGNOSIS — E782 Mixed hyperlipidemia: Secondary | ICD-10-CM

## 2023-11-07 DIAGNOSIS — E538 Deficiency of other specified B group vitamins: Secondary | ICD-10-CM

## 2023-11-07 DIAGNOSIS — F419 Anxiety disorder, unspecified: Secondary | ICD-10-CM

## 2023-11-07 DIAGNOSIS — I774 Celiac artery compression syndrome: Secondary | ICD-10-CM | POA: Diagnosis not present

## 2023-11-07 DIAGNOSIS — M810 Age-related osteoporosis without current pathological fracture: Secondary | ICD-10-CM

## 2023-11-07 DIAGNOSIS — K2100 Gastro-esophageal reflux disease with esophagitis, without bleeding: Secondary | ICD-10-CM

## 2023-11-07 DIAGNOSIS — M069 Rheumatoid arthritis, unspecified: Secondary | ICD-10-CM

## 2023-11-07 LAB — URINALYSIS, ROUTINE W REFLEX MICROSCOPIC
Bilirubin, UA: NEGATIVE
Glucose, UA: NEGATIVE
Ketones, UA: NEGATIVE
Nitrite, UA: NEGATIVE
Protein,UA: NEGATIVE
RBC, UA: NEGATIVE
Specific Gravity, UA: <1.005 — ABNORMAL LOW (ref 1.005–1.030)
Urobilinogen, Ur: 0.2 mg/dL (ref 0.2–1.0)
pH, UA: 5.5 (ref 5.0–7.5)

## 2023-11-07 LAB — MICROSCOPIC EXAMINATION
RBC, Urine: NONE SEEN /HPF (ref 0–2)
Renal Epithel, UA: NONE SEEN /HPF
Yeast, UA: NONE SEEN

## 2023-11-07 MED ORDER — BUSPIRONE HCL 5 MG PO TABS
5.0000 mg | ORAL_TABLET | Freq: Two times a day (BID) | ORAL | 2 refills | Status: AC
Start: 2023-11-07 — End: ?

## 2023-11-07 MED ORDER — SERTRALINE HCL 50 MG PO TABS: 50.0000 mg | ORAL_TABLET | Freq: Every day | ORAL | 3 refills | Status: AC

## 2023-11-07 MED ORDER — VITAMIN D (ERGOCALCIFEROL) 1.25 MG (50000 UNIT) PO CAPS: 50000.0000 [IU] | ORAL_CAPSULE | ORAL | 0 refills | Status: AC

## 2023-11-07 NOTE — Telephone Encounter (Signed)
 Pt called [facility] triage line c/o upper right quadrant pain and nausea after eating.  Pt scheduled for mesenteric US  and appt with PA on 11/11/23.

## 2023-11-07 NOTE — Progress Notes (Signed)
 Subjective:  Patient ID: Kaitlin Fleming, female    DOB: Aug 06, 1960, 63 y.o.   MRN: [id number]  Patient Care Team: Cathlene Marry Lenis, FNP as PCP - General (Family Medicine) Cindie Carlin POUR, DO as Consulting Physician (Internal Medicine)   Chief Complaint:  Medical Management of Chronic Issues  HPI: Kaitlin Fleming is a 63 y.o. female presenting on 11/07/2023 for Medical Management of Chronic Issues  HPI 1. Celiac artery stenosis (HCC) She is established with vascular and vein. She notes that she has abdominal pain. She is supposed to follow up in September. She is switching from Dr. Gretta to Dr. Vonzell. She called this week to get an earlier appt.   She notes that she is having more chest pain in the last few weeks. She reports chest pain every day. Reports that is lasts for an hour. Reports that it is random, not related to eating or activity. Denies fatigue, peripheral edema chest pain, headaches, palpitations, sweats, SOB, PND, orthopnea. She does note some changes to vision. She is going for an eye exam later this summer. Denies chest pain currently.   Primary hypertension Has BP monitor at home Yes BP at home average 110/70, 120 SBP. Max at home 140/85 - states that she was stressed. Tolerating medications well.  CAD risks hypertension, hypercholesterolemia/hyperlipidemia  Gastroesophageal reflux disease with esophagitis without hemorrhage She was previously established with GI and recommended to have EGD. She was lost to follow up due to family health issues. She is taking pantoprazole . Denies adverse side effects.  Sore throat - no Voice change - no Hemoptysis - no Dysphagia or dyspepsia - yes, she  Water brash - no Red Flags (weight loss, hematochezia, melena, weight loss, early satiety, fevers, odynophagia, or persistent vomiting) - no   Osteopenia, unspecified location She is not taking vitamin D  or calcium  supplement  Rheumatoid arthritis, involving  unspecified site, unspecified whether rheumatoid factor present [facility]) Established with rheumatology. She has follow up in October. She is taking Rinvoq that is helping some. Reports that they are monitoring Dexa at rheumatology   Anxiety/Depression, major, single episode, in partial remission [facility]) She notes that she has more anxiety lately due to caring for her parents, husband, and granddaughter. Buspar  and zoloft  are working well.   Mixed hyperlipidemia States that she is doing well on zetia . Tolerating it. Denies changes to diet or exercise.   Vitamin D  deficiency She is not taking a supplement.  Denies numbness and tingling.  She   Dysuria  States that it started last week. Reports burning, difficulty urinating, incomplete emptying, urgency, hesitancy, increased frequency, odor, lower abdominal pain, back pain, and nausea. Denies itching, discharge, vomiting, fever.   Relevant past medical, surgical, family, and social history reviewed and updated as indicated.  Allergies and medications reviewed and updated. Data reviewed: Chart in Epic.   Past Medical History:  Diagnosis Date   Allergy  to alpha-gal    Celiac artery stenosis (HCC)    Chronic back pain    stenosis   Chronic pain    takes Lexapro  daily   Complication of anesthesia    pt states after surgery in 2000 had to wear a heart monitor for 3 days.   DDD (degenerative disc disease)    Depression    FH: colon cancer 01/27/2021   GERD (gastroesophageal reflux disease)    takes OTC meds if needed   History of blood transfusion    as a child   History of bronchitis 47yrs  ago   Hyperlipidemia    takes Fenofibrate  daily   Joint pain    Joint swelling    Kidney stone    has one right now on the right side but not giving any problems   Nausea 01/06/2021   Osteoarthritis    Peripheral edema    takes HCTZ daily    Pneumonia 46yrs ago   hx of   PONV (postoperative nausea and vomiting)    Preventative health care  06/25/2019   Rheumatoid arthritis (HCC)    Rheumatoid arthritis (HCC)    Severe sepsis (HCC) 07/20/2022   Weakness    and tingling on left side    Past Surgical History:  Procedure Laterality Date   ABDOMINAL HYSTERECTOMY  05/14/1998   BACK SURGERY  03/10/2014   lumbar fusion   BASAL CELL CARCINOMA EXCISION  11/2021   LUMBAR LAMINECTOMY/DECOMPRESSION MICRODISCECTOMY Left 12/08/2013   Procedure: LEFT LUMBAR FOUR-FIVEW, LUMBAR FIVE-SACRAL ONE LAMINECTOMY;  Surgeon: Catalina CHRISTELLA Stains, MD;  Location: [facility];  Service: Neurosurgery;  Laterality: Left;   TUBAL LIGATION  24 yrs ago    Social History   Socioeconomic History   Marital status: Married    Spouse name: Not on file   Number of children: Not on file   Years of education: Not on file   Highest education level: Not on file  Occupational History   Not on file  Tobacco Use   Smoking status: Never    Passive exposure: Never   Smokeless tobacco: Never  Vaping Use   Vaping status: Never Used  Substance and Sexual Activity   Alcohol use: No   Drug use: No   Sexual activity: Yes    Birth control/protection: Surgical  Other Topics Concern   Not on file  Social History Narrative   Not on file   Social Drivers of Health   Financial Resource Strain: Low Risk  (09/11/2022)   [facility]   Overall Financial Resource Strain (CARDIA)    Difficulty of Paying Living Expenses: Not hard at all  Food Insecurity: No Food Insecurity (07/11/2023)   Hunger Vital Sign    Worried About Running Out of Food in the Last Year: Never true    Ran Out of Food in the Last Year: Never true  Transportation Needs: No Transportation Needs (07/11/2023)   PRAPARE - Administrator, Civil Service (Medical): No    Lack of Transportation (Non-Medical): No  Physical Activity: Not on file  Stress: Not on file  Social Connections: Unknown (09/26/2021)   Received from [facility]   Social Network    Social Network: Not  on file  Intimate Partner Violence: Not At Risk (07/11/2023)   Humiliation, Afraid, Rape, and Kick questionnaire    Fear of Current or Ex-Partner: No    Emotionally Abused: No    Physically Abused: No    Sexually Abused: No    Outpatient Encounter Medications as of 11/07/2023  Medication Sig   albuterol  (PROVENTIL ) (2.5 MG/3ML) 0.083% nebulizer solution Take 3 mLs (2.5 mg total) by nebulization every 6 (six) hours as needed for wheezing or shortness of breath.   aspirin  EC 81 MG tablet Take 81 mg by mouth daily. Swallow whole.   benzonatate  (TESSALON ) 100 MG capsule Take 1 capsule (100 mg total) by mouth every 8 (eight) hours.   benzonatate  (TESSALON ) 100 MG capsule Take 1 capsule (100 mg total) by mouth every 8 (eight) hours.   busPIRone  (BUSPAR ) 5 MG  tablet TAKE ONE (1) TABLET BY MOUTH TWO (2) TIMES DAILY   cholestyramine  (QUESTRAN ) 4 g packet Take 1 packet (4 g total) by mouth daily.   EPINEPHrine  0.3 mg/0.3 mL IJ SOAJ injection Inject 0.3 mg into the muscle as needed for anaphylaxis.   ezetimibe  (ZETIA ) 10 MG tablet Take 1 tablet (10 mg total) by mouth daily.   fosfomycin (MONUROL ) 3 g PACK Take 3 g by mouth once.   ketoconazole  (NIZORAL ) 2 % cream Apply 1 Application topically daily.   lisinopril  (ZESTRIL ) 5 MG tablet Take 1 tablet (5 mg total) by mouth daily.   loratadine  (CLARITIN ) 10 MG tablet Take 1 tablet (10 mg total) by mouth at bedtime.   ondansetron  (ZOFRAN -ODT) 4 MG disintegrating tablet Take 1 tablet (4 mg total) by mouth every 8 (eight) hours as needed.   Oxycodone  HCl 10 MG TABS Take 10 mg by mouth 4 (four) times daily as needed.   pantoprazole  (PROTONIX ) 40 MG tablet TAKE 1 TABLET BY MOUTH TWICE DAILY BEFORE MEALS   phenazopyridine  (PYRIDIUM ) 200 MG tablet Take 1 tablet (200 mg total) by mouth 3 (three) times daily.   predniSONE  (DELTASONE ) 20 MG tablet Take 20 mg by mouth 2 (two) times daily.   sertraline  (ZOLOFT ) 50 MG tablet Take 1 tablet (50 mg total) by mouth daily.    Upadacitinib ER (RINVOQ) 15 MG TB24 Take 1 tablet by mouth daily.   valACYclovir  (VALTREX ) 1000 MG tablet TAKE ONE (1) TABLET BY MOUTH TWO (2) TIMES DAILY   No facility-administered encounter medications on file as of 11/07/2023.    Allergies  Allergen Reactions   Alpha-Gal Anaphylaxis, Hives, Swelling and Other (See Comments)    Serology-proven   Macrobid [Nitrofurantoin] Shortness Of Breath and Rash   Pravastatin  Nausea And Vomiting and Other (See Comments)    Rash & heart racing.   Macrolides And Ketolides    Methotrexate    Prevacid  [Lansoprazole ] Other (See Comments)    Started foaming at the mouth   Remicade [Infliximab ]    Tofacitinib Other (See Comments)    Upper respiratory infections   Doxycycline  Other (See Comments)    blisters   Ibuprofen  Hives   Keflex  [Cephalexin ] Rash   Penicillins Other (See Comments)    Breaks out immediate rash, facial/tongue/throat swelling, SOB or lightheadedness with hypotension   Sulfa Antibiotics Other (See Comments)    Breaks out    Review of Systems As per HPI  Objective:  BP 134/73   Pulse 72   Temp 98 F (36.7 C)   Ht 5' 7 (1.702 m)   Wt 172 lb (78 kg)   SpO2 97%   BMI 26.94 kg/m    Wt Readings from Last 3 Encounters:  08/14/23 164 lb 14.5 oz (74.8 kg)  08/09/23 165 lb (74.8 kg)  07/11/23 170 lb (77.1 kg)    Physical Exam Constitutional:      General: She is awake. She is not in acute distress.    Appearance: Normal appearance. She is well-developed and well-groomed. She is not ill-appearing, toxic-appearing or diaphoretic.   Cardiovascular:     Rate and Rhythm: Normal rate and regular rhythm.     Pulses: Normal pulses.          Radial pulses are 2+ on the right side and 2+ on the left side.       Posterior tibial pulses are 2+ on the right side and 2+ on the left side.     Heart sounds: Normal heart  sounds. No murmur heard.    No gallop.  Pulmonary:     Effort: Pulmonary effort is normal. No respiratory  distress.     Breath sounds: Normal breath sounds. No stridor. No wheezing, rhonchi or rales.   Musculoskeletal:     Cervical back: Full passive range of motion without pain and neck supple.     Right lower leg: No edema.     Left lower leg: No edema.   Skin:    General: Skin is warm.     Capillary Refill: Capillary refill takes less than 2 seconds.   Neurological:     General: No focal deficit present.     Mental Status: She is alert, oriented to person, place, and time and easily aroused. Mental status is at baseline.     GCS: GCS eye subscore is 4. GCS verbal subscore is 5. GCS motor subscore is 6.     Motor: No weakness.   Psychiatric:        Attention and Perception: Attention and perception normal.        Mood and Affect: Mood and affect normal.        Speech: Speech normal.        Behavior: Behavior normal. Behavior is cooperative.        Thought Content: Thought content normal. Thought content does not include homicidal or suicidal ideation. Thought content does not include homicidal or suicidal plan.        Cognition and Memory: Cognition and memory normal.        Judgment: Judgment normal.     Results for orders placed or performed during the hospital encounter of 08/14/23  Resp panel by RT-PCR (RSV, Flu A&B, Covid) Anterior Nasal Swab   Collection Time: 08/14/23  4:12 PM   Specimen: Anterior Nasal Swab  Result Value Ref Range   SARS Coronavirus 2 by RT PCR POSITIVE (A) NEGATIVE   Influenza A by PCR NEGATIVE NEGATIVE   Influenza B by PCR NEGATIVE NEGATIVE   Resp Syncytial Virus by PCR NEGATIVE NEGATIVE       07/11/2023   11:12 AM 04/02/2023   11:08 AM 12/20/2022   10:15 AM 10/09/2022   10:04 AM 08/23/2022    1:02 PM  Depression screen PHQ 2/9  Decreased Interest 0 0 0 0 0  Down, Depressed, Hopeless 1 0 0 0 0  PHQ - 2 Score 1 0 0 0 0  Altered sleeping 0 0 0 0 1  Tired, decreased energy 2 2 1 1 1   Change in appetite 1 0 0 0 1  Feeling bad or failure about  yourself  0 0 0 0 0  Trouble concentrating 0 0 0 0 0  Moving slowly or fidgety/restless 0 1 0 1 1  Suicidal thoughts 0 0 0 0 0  PHQ-9 Score 4 3 1 2 4   Difficult doing work/chores Somewhat difficult Very difficult Not difficult at all Somewhat difficult Somewhat difficult       07/11/2023   11:13 AM 04/02/2023   11:08 AM 12/20/2022   10:16 AM 10/09/2022   10:05 AM  GAD 7 : Generalized Anxiety Score  Nervous, Anxious, on Edge 0 0 0 0  Control/stop worrying 0 1 0 0  Worry too much - different things 0 1 0 0  Trouble relaxing 0 0 0 0  Restless 0 0 0 0  Easily annoyed or irritable 0 1 0 0  Afraid - awful might happen 0 0 0 0  Total GAD 7 Score 0 3 0 0  Anxiety Difficulty Not difficult at all Somewhat difficult Not difficult at all Not difficult at all    The 10-year ASCVD risk score (Arnett DK, et al., 2019) is: 8.4%   Values used to calculate the score:     Age: 85 years     Clincally relevant sex: Female     Is Non-Hispanic African American: No     Diabetic: No     Tobacco smoker: No     Systolic Blood Pressure: 134 mmHg     Is BP treated: Yes     HDL Cholesterol: 40 mg/dL     Total Cholesterol: 226 mg/dL   Pertinent labs & imaging results that were available during my care of the patient were reviewed by me and considered in my medical decision making.  Assessment & Plan:  [place] was seen today for medical management of chronic issues.  Diagnoses and all orders for this visit: 1. Celiac artery stenosis (HCC) (Primary) Established with Gretta, MD. However planning to switch to [facility]. Patient has reach out for an earlier appt, recommend that she follow up with specialty given symptoms. She is tolerating zetia . Continue ASA for risk reduction.   2. Depression, major, single episode, in partial remission (HCC) Stable. Denies SI. Continue current regimen.  - busPIRone  (BUSPAR ) 5 MG tablet; Take 1 tablet (5 mg total) by mouth 2 (two) times daily.  Dispense: 60 tablet; Refill:  2 - sertraline  (ZOLOFT ) 50 MG tablet; Take 1 tablet (50 mg total) by mouth daily.  Dispense: 90 tablet; Refill: 3  3. Anxiety As above.  - busPIRone  (BUSPAR ) 5 MG tablet; Take 1 tablet (5 mg total) by mouth 2 (two) times daily.  Dispense: 60 tablet; Refill: 2 - sertraline  (ZOLOFT ) 50 MG tablet; Take 1 tablet (50 mg total) by mouth daily.  Dispense: 90 tablet; Refill: 3  4. Primary hypertension Stable. Continue current regimen.   5. Gastroesophageal reflux disease with esophagitis without hemorrhage Discussed that chest pain may be related to GERD. She is established with GI. Reviewed notes from [place], [place] 01/27/2021. She was recommended to have EGD at that time and has not followed up. Recommend that she follow up. HIDA scan was normal per [place], PA, 01/30/2021.   6. Age-related osteoporosis without current pathological fracture Continue to follow with rheumatology. She is not on any medications at this time. Reviewed previous DEXA.   [facility] DEXA SCAN (Order [id number]) 12/06/2021 Osteopenia  Interval decrease in BMD from 11/30/19  1.5% 10 year risk if hip fracture  15% 10 year risk of major fracture   7. Rheumatoid arthritis, involving unspecified site, unspecified whether rheumatoid factor present [facility]) Reviewed notes from Rheumatology, [place], [place] 10/30/2023. Patient to continue to follow with specialty.   8. Mixed hyperlipidemia Tolerating zetia !  Continue risk reduction. Reviewed ASCVD risk score.   9. Vitamin D  deficiency Patient did not pick up prior Rx. Resent below.  - Vitamin D , Ergocalciferol , (DRISDOL ) 1.25 MG (50000 UNIT) CAPS capsule; Take 1 capsule (50,000 Units total) by mouth every 7 (seven) days for 12 doses. X12 weeks. Then start OTC Vit D 800 IU daily.  Dispense: 12 capsule; Refill: 0  10. Dysuria Based on UA, will await results for culture to prescribe medication.  - Urinalysis, Routine w reflex microscopic - Urine Culture  11. Other chest pain Reviewed EKG. No acute  findings. Reviewed labs, nothing remarkable to explain symptoms. Discussed with patient to increase pepcid , if continues will  complete zio. Recommend that she follow up with vascular provider. She plans to reach out again today.  - EKG 12-Lead - EKG 12-Lead  12. Vitamin B12 deficiency Encouraged patient to restart supplementation. She prefers oral to injections.   Continue all other maintenance medications.  Follow up plan: Return in about 3 months (around 02/07/2024) for Chronic Condition Follow up.  Continue healthy lifestyle choices, including diet (rich in fruits, vegetables, and lean proteins, and low in salt and simple carbohydrates) and exercise (at least 30 minutes of moderate physical activity daily).  Written and verbal instructions provided   The above assessment and management plan was discussed with the patient. The patient verbalized understanding of and has agreed to the management plan. Patient is aware to call the clinic if they develop any new symptoms or if symptoms persist or worsen. Patient is aware when to return to the clinic for a follow-up visit. Patient educated on when it is appropriate to go to the emergency department.   Marry Kins, DNP-FNP [facility] [address] [place], [place] [address] [phone]

## 2023-11-08 NOTE — Progress Notes (Signed)
 Establishing with you later. Still awaiting urine culture results, waiting on culture to start abx.

## 2023-11-10 LAB — URINE CULTURE

## 2023-11-11 ENCOUNTER — Ambulatory Visit: Admitting: Physician Assistant

## 2023-11-11 ENCOUNTER — Ambulatory Visit (HOSPITAL_COMMUNITY)
Admission: RE | Admit: 2023-11-11 | Discharge: 2023-11-11 | Disposition: A | Discharge status: Home / Self Care | Source: Ambulatory Visit | Attending: Surgery | Admitting: Surgery

## 2023-11-11 ENCOUNTER — Encounter: Payer: Self-pay | Admitting: Physician Assistant

## 2023-11-11 VITALS — BP 141/83 | HR 70 | Temp 98.1°F | Ht 67.0 in | Wt 173.4 lb

## 2023-11-11 DIAGNOSIS — I774 Celiac artery compression syndrome: Secondary | ICD-10-CM

## 2023-11-11 NOTE — Progress Notes (Signed)
 HISTORY AND PHYSICAL     CC:  follow up. Requesting Provider:  Cathlene Marry Lenis*  HPI: This is a 63 y.o. female who is here today for follow up for celiac artery occlusive disease.  In March 2024 she had a CT a/p that revealed a suspected celiac artery stenosis.  Pt was last seen 01/15/2023 and at that time, she was doing well.  She had gained 7 lbs.  She did not have fear of food or significant abdominal pain after eating.   Her duplex at that time revealed a 70% celiac artery stenosis but no significant SMA flow limiting stenosis and in absence of sx, will continue to follow.  She was to continue asa and statin.   The pt returns today for follow up.  She moved her appt up because she starting having some pain and nausea after eating.  She states that this does not happen every time.  She states that she did lose about 25 lbs earlier this year.  She states she did have Covid twice and the flu in the beginning of the year and that could've caused her weight loss.  She states that she has gained 4 lbs back.   She does have a GI MD in [place] but states she has not seen them in several years.  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  She does take a daily asa.    Her husband did have a carotid stent placed by Dr. Magda.    The pt is not on a statin for cholesterol management.    The pt is on an aspirin .    Other AC:  none The pt is on ACEI for hypertension.  The pt is not on medication diabetes. Tobacco hx:  never   Past Medical History:  Diagnosis Date   Allergy  to alpha-gal    Celiac artery stenosis (HCC)    Chronic back pain    stenosis   Chronic pain    takes Lexapro  daily   Complication of anesthesia    pt states after surgery in 2000 had to wear a heart monitor for 3 days.   DDD (degenerative disc disease)    Depression    FH: colon cancer  01/27/2021   GERD (gastroesophageal reflux disease)    takes OTC meds if needed   History of blood transfusion    as a child   History of bronchitis 53yrs ago   Hyperlipidemia    takes Fenofibrate  daily   Joint pain    Joint swelling    Kidney stone    has one right now on the right side but not giving any problems   Nausea 01/06/2021   Osteoarthritis    Peripheral edema    takes HCTZ daily    Pneumonia 59yrs ago   hx of   PONV (postoperative nausea and vomiting)    Preventative health care 06/25/2019   Rheumatoid arthritis (HCC)    Rheumatoid arthritis (HCC)    Severe sepsis (HCC) 07/20/2022   Weakness    and tingling on left side    Past Surgical History:  Procedure Laterality Date   ABDOMINAL HYSTERECTOMY  05/14/1998   BACK SURGERY  03/10/2014   lumbar fusion   BASAL CELL CARCINOMA EXCISION  11/2021   LUMBAR LAMINECTOMY/DECOMPRESSION MICRODISCECTOMY Left 12/08/2013   Procedure: LEFT LUMBAR FOUR-FIVEW, LUMBAR  FIVE-SACRAL ONE LAMINECTOMY;  Surgeon: Catalina CHRISTELLA Stains, MD;  Location: [facility];  Service: Neurosurgery;  Laterality: Left;   TUBAL LIGATION  24 yrs ago    Allergies  Allergen Reactions   Alpha-Gal Anaphylaxis, Hives, Swelling and Other (See Comments)    Serology-proven   Macrobid [Nitrofurantoin] Shortness Of Breath and Rash   Pravastatin  Nausea And Vomiting and Other (See Comments)    Rash & heart racing.   Macrolides And Ketolides    Methotrexate    Prevacid  [Lansoprazole ] Other (See Comments)    Started foaming at the mouth   Remicade [Infliximab ]    Tofacitinib Other (See Comments)    Upper respiratory infections   Doxycycline  Other (See Comments)    blisters   Ibuprofen  Hives   Keflex  Cosmo.Cordia ] Rash   Penicillins Other (See Comments)    Breaks out immediate rash, facial/tongue/throat swelling, SOB or lightheadedness with hypotension   Sulfa Antibiotics Other (See Comments)    Breaks out    Current Outpatient Medications  Medication  Sig Dispense Refill   aspirin  EC 81 MG tablet Take 81 mg by mouth daily. Swallow whole.     busPIRone  (BUSPAR ) 5 MG tablet Take 1 tablet (5 mg total) by mouth 2 (two) times daily. 60 tablet 2   EPINEPHrine  0.3 mg/0.3 mL IJ SOAJ injection Inject 0.3 mg into the muscle as needed for anaphylaxis. 2 each 1   ezetimibe  (ZETIA ) 10 MG tablet Take 1 tablet (10 mg total) by mouth daily. 90 tablet 3   ketoconazole  (NIZORAL ) 2 % cream Apply 1 Application topically daily. 15 g 0   lisinopril  (ZESTRIL ) 5 MG tablet Take 1 tablet (5 mg total) by mouth daily. 90 tablet 1   loratadine  (CLARITIN ) 10 MG tablet Take 1 tablet (10 mg total) by mouth at bedtime. 30 tablet 1   ondansetron  (ZOFRAN -ODT) 4 MG disintegrating tablet Take 1 tablet (4 mg total) by mouth every 8 (eight) hours as needed. 10 tablet 0   Oxycodone  HCl 10 MG TABS Take 10 mg by mouth 4 (four) times daily as needed.     pantoprazole  (PROTONIX ) 40 MG tablet TAKE 1 TABLET BY MOUTH TWICE DAILY BEFORE MEALS 60 tablet 1   sertraline  (ZOLOFT ) 50 MG tablet Take 1 tablet (50 mg total) by mouth daily. 90 tablet 3   Upadacitinib ER (RINVOQ) 15 MG TB24 Take 1 tablet by mouth daily.     valACYclovir  (VALTREX ) 1000 MG tablet TAKE ONE (1) TABLET BY MOUTH TWO (2) TIMES DAILY 20 tablet 0   Vitamin D , Ergocalciferol , (DRISDOL ) 1.25 MG (50000 UNIT) CAPS capsule Take 1 capsule (50,000 Units total) by mouth every 7 (seven) days for 12 doses. X12 weeks. Then start OTC Vit D 800 IU daily. 12 capsule 0   No current facility-administered medications for this visit.    Family History  Problem Relation Age of Onset   Colon cancer Maternal Grandmother    Colon cancer Paternal Uncle     Social History   Socioeconomic History   Marital status: Married    Spouse name: Not on file   Number of children: Not on file   Years of education: Not on file   Highest education level: Not on file  Occupational History   Not on file  Tobacco Use   Smoking status: Never     Passive exposure: Never   Smokeless tobacco: Never  Vaping Use   Vaping status: Never Used  Substance and Sexual Activity   Alcohol use: No  Drug use: No   Sexual activity: Yes    Birth control/protection: Surgical  Other Topics Concern   Not on file  Social History Narrative   Not on file   Social Drivers of Health   Financial Resource Strain: Low Risk  (09/11/2022)   Received from [facility]   Overall Financial Resource Strain (CARDIA)    Difficulty of Paying Living Expenses: Not hard at all  Food Insecurity: No Food Insecurity (07/11/2023)   Hunger Vital Sign    Worried About Running Out of Food in the Last Year: Never true    Ran Out of Food in the Last Year: Never true  Transportation Needs: No Transportation Needs (07/11/2023)   PRAPARE - Administrator, Civil Service (Medical): No    Lack of Transportation (Non-Medical): No  Physical Activity: Not on file  Stress: Not on file  Social Connections: Unknown (09/26/2021)   Received from [facility]   Social Network    Social Network: Not on file  Intimate Partner Violence: Not At Risk (07/11/2023)   Humiliation, Afraid, Rape, and Kick questionnaire    Fear of Current or Ex-Partner: No    Emotionally Abused: No    Physically Abused: No    Sexually Abused: No     REVIEW OF SYSTEMS:   [X]  denotes positive finding, [ ]  denotes negative finding Cardiac  Comments:  Chest pain or chest pressure:    Shortness of breath upon exertion:    Short of breath when lying flat:    Irregular heart rhythm:        Vascular    Pain in calf, thigh, or hip brought on by ambulation:    Pain in feet at night that wakes you up from your sleep:     Blood clot in your veins:    Leg swelling:         Pulmonary    Oxygen  at home:    Productive cough:     Wheezing:         Neurologic    Sudden weakness in arms or legs:     Sudden numbness in arms or legs:     Sudden onset of difficulty speaking or slurred speech:     Temporary loss of vision in one eye:     Problems with dizziness:         Gastrointestinal    Blood in stool:     Vomited blood:         Genitourinary    Burning when urinating:     Blood in urine:        Psychiatric    Major depression:         Hematologic    Bleeding problems:    Problems with blood clotting too easily:        Skin    Rashes or ulcers:        Constitutional    Fever or chills:      PHYSICAL EXAMINATION:  Today's Vitals   11/11/23 1459  BP: (!) 141/83  Pulse: 70  Temp: 98.1 F (36.7 C)  TempSrc: Temporal  SpO2: 98%  Weight: 173 lb 6.4 oz (78.7 kg)  Height: 5' 7 (1.702 m)  PainSc: 0-No pain   Body mass index is 27.16 kg/m.   General:  WDWN in NAD; vital signs documented above Gait: Not observed HENT: WNL, normocephalic Pulmonary: normal non-labored breathing  Cardiac: regular HR, without carotid bruits Abdomen: soft, NT; aortic pulse is  not palpable Skin: without rashes Vascular Exam/Pulses:  Right Left  Radial 2+ (normal) 2+ (normal)  PT 2+ (normal) 2+ (normal)   Extremities: without ischemic changes, without Gangrene , without cellulitis; without open wounds Musculoskeletal: no muscle wasting or atrophy  Neurologic: A&O X 3;  No focal weakness or paresthesias are detected Psychiatric:  The pt has Normal affect.   Non-Invasive Vascular Imaging:   Mesenteric Arterial duplex on 11/11/2023: +----------------------+--------+--------+------+--------------------------  Mesenteric           PSV cm/sEDV cm/sPlaque           Comments        +----------------------+--------+--------+------+--------------------------  Aorta Prox               53                                            +----------------------+--------+--------+------+--------------------------  Celiac Artery Origin    318      92                                    +----------------------+--------+--------+------+--------------------------  Celiac Artery  Proximal  552     169                                    +----------------------+--------+--------+------+--------------------------  SMA Proximal            171      3                                     +----------------------+--------+--------+------+--------------------------  SMA Mid                 209      41                                    +----------------------+--------+--------+------+--------------------------  CHA                    193      37                                    +----------------------+--------+--------+------+--------------------------  Splenic                256      64           entirety of splenic artery difficult to evaluate.      +----------------------+--------+--------+------+--------------------------  IMA                    191                                            +----------------------+--------+--------+------+--------------------------   Summary:  Largest Aortic Diameter: 2.3 cm    Mesenteric:  Normal Inferior Mesenteric artery and Superior Mesenteric artery findings.  70 to 99% stenosis in the celiac artery.  Previous Mesenteric arterial duplex on 02/11/2023: Duplex Findings:  +--------------------+--------+--------+------+---------------------+  Mesenteric         PSV cm/sEDV cm/sPlaque      Comments         +--------------------+--------+--------+------+---------------------+  Aorta Prox             85                                        +--------------------+--------+--------+------+---------------------+  Aorta Mid             104                                        +--------------------+--------+--------+------+---------------------+  Celiac Artery Origin  >200                exceeds Nyquist limit  +--------------------+--------+--------+------+---------------------+  SMA Proximal          222                                         +--------------------+--------+--------+------+---------------------+  SMA Mid               221                                        +--------------------+--------+--------+------+---------------------+  CHA                  290                                        +--------------------+--------+--------+------+---------------------+  Splenic              250                                        +--------------------+--------+--------+------+---------------------+   Summary:  Mesenteric:  70 to 99% stenosis in the celiac artery.    ASSESSMENT/PLAN:: 63 y.o. female here for follow up and has hx of celiac artery occlusive disease.  In March 2024 she had a CT a/p that revealed a suspected celiac artery stenosis  -pt moved up appt from September due to having some nausea and pain after eating.  She does not have this every time she eats.  She is not fearful of food.  She did have some weight loss earlier in the year, however, she did have covid twice and the flu once from January to March.  She has since gained back about 4 lbs.   -she does have elevated velocities on duplex today.  Reviewed with Dr. Serene who looked at her CT scan from January and the celiac was patent.  She does not get severe pain after eating every time and she has actually gained 4 lbs back so do not feel this is due to mesenteric ischemia as her SMA and IMA are normal on duplex.   -discussed returning to her gastroenterologist for further evaluation.  -continue daily asa.  -  pt will f/u in one year with mesenteric duplex.  She would like to f/u with Dr. Magda since he has placed carotid stent on her husband.  She knows to call sooner if she has any issues before then.     Lucie Apt, [facility] [phone]  Clinic MD:   Serene

## 2023-11-12 ENCOUNTER — Telehealth: Payer: Self-pay | Admitting: Family Medicine

## 2023-11-12 NOTE — Telephone Encounter (Signed)
 Copied from CRM [id number]. Topic: Clinical - Lab/Test Results >> Nov 12, 2023 12:51 PM Geneva B wrote: Reason for CRM: pt needs clarity on lab work please [phone]

## 2023-11-12 NOTE — Telephone Encounter (Signed)
 Please advise patient culture covering provider

## 2023-11-13 ENCOUNTER — Telehealth: Payer: Self-pay

## 2023-11-13 DIAGNOSIS — N3000 Acute cystitis without hematuria: Secondary | ICD-10-CM

## 2023-11-13 MED ORDER — CIPROFLOXACIN HCL 500 MG PO TABS: 500.0000 mg | ORAL_TABLET | Freq: Two times a day (BID) | ORAL | 0 refills | Status: AC

## 2023-11-13 NOTE — Telephone Encounter (Signed)
 Per Kaitlin Fleming's note: Establishing with you later. Still awaiting urine culture results, waiting on culture to start abx   Patient has not been prescribed an antibiotic yet.  She was seen on Kaitlin Fleming's last day.

## 2023-11-13 NOTE — Telephone Encounter (Signed)
 Patient called stated she does not need to come back in again as she has been seen by a dr on last Thursday. She advised that [facility] did her labs and it showed she has a bacteria infection and all she needs is an antibiotic. Please f/u with patient for medication

## 2023-11-13 NOTE — Telephone Encounter (Signed)
 Copied from CRM [id number]. Topic: Clinical - Lab/Test Results >> Nov 13, 2023 10:50 AM Sophia H wrote: Reason for CRM: Patient is calling again regarding results from urine culture, please reach out and advise. # [phone]  Still having burning sensation when she urinates, back pain. Wants to know if antibiotics will be called in. Ty   [facility] [place] [place] - [address]

## 2023-11-13 NOTE — Addendum Note (Signed)
 Addended by: JOESPH ANNABELLA HERO on: 11/13/2023 02:12 PM   Modules accepted: Orders

## 2023-11-13 NOTE — Telephone Encounter (Signed)
 Cipro ordered.

## 2023-11-13 NOTE — Telephone Encounter (Signed)
 Left detailed message per signed DPR. Encouraged call back or send us  a Mychart message if there are any questions.

## 2023-12-05 ENCOUNTER — Other Ambulatory Visit: Payer: Self-pay | Admitting: *Deleted

## 2023-12-05 DIAGNOSIS — I10 Essential (primary) hypertension: Secondary | ICD-10-CM

## 2023-12-05 MED ORDER — LISINOPRIL 5 MG PO TABS: 5.0000 mg | ORAL_TABLET | Freq: Every day | ORAL | 1 refills | Status: DC

## 2023-12-27 ENCOUNTER — Other Ambulatory Visit: Payer: Self-pay | Admitting: *Deleted

## 2023-12-27 DIAGNOSIS — K2100 Gastro-esophageal reflux disease with esophagitis, without bleeding: Secondary | ICD-10-CM

## 2023-12-27 MED ORDER — PANTOPRAZOLE SODIUM 40 MG PO TBEC
40.0000 mg | DELAYED_RELEASE_TABLET | Freq: Two times a day (BID) | ORAL | 1 refills | Status: AC
Start: 2023-12-27 — End: ?

## 2023-12-31 DIAGNOSIS — G894 Chronic pain syndrome: Secondary | ICD-10-CM | POA: Diagnosis not present

## 2023-12-31 DIAGNOSIS — Z79891 Long term (current) use of opiate analgesic: Secondary | ICD-10-CM | POA: Diagnosis not present

## 2023-12-31 DIAGNOSIS — M4326 Fusion of spine, lumbar region: Secondary | ICD-10-CM | POA: Diagnosis not present

## 2023-12-31 DIAGNOSIS — M5459 Other low back pain: Secondary | ICD-10-CM | POA: Diagnosis not present

## 2023-12-31 DIAGNOSIS — M5416 Radiculopathy, lumbar region: Secondary | ICD-10-CM | POA: Diagnosis not present

## 2024-01-07 DIAGNOSIS — K5792 Diverticulitis of intestine, part unspecified, without perforation or abscess without bleeding: Secondary | ICD-10-CM | POA: Diagnosis not present

## 2024-01-07 DIAGNOSIS — Z91014 Allergy to mammalian meats: Secondary | ICD-10-CM | POA: Diagnosis not present

## 2024-01-07 DIAGNOSIS — Z881 Allergy status to other antibiotic agents status: Secondary | ICD-10-CM | POA: Diagnosis not present

## 2024-01-07 DIAGNOSIS — Z9109 Other allergy status, other than to drugs and biological substances: Secondary | ICD-10-CM | POA: Diagnosis not present

## 2024-01-07 DIAGNOSIS — M0589 Other rheumatoid arthritis with rheumatoid factor of multiple sites: Secondary | ICD-10-CM | POA: Diagnosis not present

## 2024-01-07 DIAGNOSIS — N3000 Acute cystitis without hematuria: Secondary | ICD-10-CM | POA: Diagnosis not present

## 2024-01-07 DIAGNOSIS — Z7982 Long term (current) use of aspirin: Secondary | ICD-10-CM | POA: Diagnosis not present

## 2024-01-07 DIAGNOSIS — I10 Essential (primary) hypertension: Secondary | ICD-10-CM | POA: Diagnosis not present

## 2024-01-07 DIAGNOSIS — Z90710 Acquired absence of both cervix and uterus: Secondary | ICD-10-CM | POA: Diagnosis not present

## 2024-01-07 DIAGNOSIS — Z882 Allergy status to sulfonamides status: Secondary | ICD-10-CM | POA: Diagnosis not present

## 2024-01-07 DIAGNOSIS — Z87440 Personal history of urinary (tract) infections: Secondary | ICD-10-CM | POA: Diagnosis not present

## 2024-01-07 DIAGNOSIS — Z981 Arthrodesis status: Secondary | ICD-10-CM | POA: Diagnosis not present

## 2024-01-07 DIAGNOSIS — K8021 Calculus of gallbladder without cholecystitis with obstruction: Secondary | ICD-10-CM | POA: Diagnosis not present

## 2024-01-07 DIAGNOSIS — Z79899 Other long term (current) drug therapy: Secondary | ICD-10-CM | POA: Diagnosis not present

## 2024-01-07 DIAGNOSIS — M545 Low back pain, unspecified: Secondary | ICD-10-CM | POA: Diagnosis not present

## 2024-01-07 DIAGNOSIS — K760 Fatty (change of) liver, not elsewhere classified: Secondary | ICD-10-CM | POA: Diagnosis not present

## 2024-01-07 DIAGNOSIS — Z888 Allergy status to other drugs, medicaments and biological substances status: Secondary | ICD-10-CM | POA: Diagnosis not present

## 2024-01-07 DIAGNOSIS — Z880 Allergy status to penicillin: Secondary | ICD-10-CM | POA: Diagnosis not present

## 2024-01-15 DIAGNOSIS — Z1231 Encounter for screening mammogram for malignant neoplasm of breast: Secondary | ICD-10-CM | POA: Diagnosis not present

## 2024-01-16 ENCOUNTER — Telehealth: Payer: Self-pay

## 2024-01-16 DIAGNOSIS — K219 Gastro-esophageal reflux disease without esophagitis: Secondary | ICD-10-CM

## 2024-01-16 NOTE — Progress Notes (Signed)
 Complex Care Management Note Care Guide Note  01/16/2024 Name: Kaitlin Fleming MRN: [id number] DOB: January 19, 1961   Complex Care Management Outreach Attempts: An unsuccessful telephone outreach was attempted today to offer the patient information about available complex care management services.  Follow Up Plan:  Additional outreach attempts will be made to offer the patient complex care management information and services.   Encounter Outcome:  No Answer-Left Voicemail  Leotis Rase [facility] Guide  Direct Dial: [phone]  Fax [phone]

## 2024-01-21 NOTE — Progress Notes (Unsigned)
 Complex Care Management Note Care Guide Note  01/21/2024 Name: Kaitlin Fleming MRN: [id number] DOB: 1961/03/17   Complex Care Management Outreach Attempts: A second unsuccessful outreach was attempted today to offer the patient with information about available complex care management services.  Follow Up Plan:  Additional outreach attempts will be made to offer the patient complex care management information and services.   Encounter Outcome:  No Answer-Left voicemail  Leotis Rase [facility], [facility] Guide  Direct Dial: [phone]  Fax [phone]

## 2024-01-22 DIAGNOSIS — G8929 Other chronic pain: Secondary | ICD-10-CM | POA: Diagnosis not present

## 2024-01-22 DIAGNOSIS — M47819 Spondylosis without myelopathy or radiculopathy, site unspecified: Secondary | ICD-10-CM | POA: Diagnosis not present

## 2024-01-22 DIAGNOSIS — M5442 Lumbago with sciatica, left side: Secondary | ICD-10-CM | POA: Diagnosis not present

## 2024-01-22 DIAGNOSIS — M48061 Spinal stenosis, lumbar region without neurogenic claudication: Secondary | ICD-10-CM | POA: Diagnosis not present

## 2024-01-23 NOTE — Progress Notes (Signed)
 Complex Care Management Note Care Guide Note  01/23/2024 Name: Kaitlin Fleming MRN: [id number] DOB: 11-04-1960   Complex Care Management Outreach Attempts: A third unsuccessful outreach was attempted today to offer the patient with information about available complex care management services.  Follow Up Plan:  No further outreach attempts will be made at this time. We have been unable to contact the patient to offer or enroll patient in complex care management services.  Encounter Outcome:  No Answer-Left voicemail  Leotis Rase [facility], [facility] Guide  Direct Dial: [phone]  Fax [phone]

## 2024-02-21 NOTE — Patient Instructions (Addendum)
 Our records indicate that you are due for your screening mammogram.  Please call the imaging center that does your yearly mammograms to make an appointment for a mammogram at your earliest convenience. Our office also has a mobile unit through the [facility] that comes to our location. Please call [phone] or stop at check out if you would like to make an appointment.    Health Maintenance, Female Adopting a healthy lifestyle and getting preventive care are important in promoting health and wellness. Ask your health care provider about: The right schedule for you to have regular tests and exams. Things you can do on your own to prevent diseases and keep yourself healthy. What should I know about diet, weight, and exercise? Eat a healthy diet  Eat a diet that includes plenty of vegetables, fruits, low-fat dairy products, and lean protein. Do not eat a lot of foods that are high in solid fats, added sugars, or sodium. Maintain a healthy weight Body mass index (BMI) is used to identify weight problems. It estimates body fat based on height and weight. Your health care provider can help determine your BMI and help you achieve or maintain a healthy weight. Get regular exercise Get regular exercise. This is one of the most important things you can do for your health. Most adults should: Exercise for at least 150 minutes each week. The exercise should increase your heart rate and make you sweat (moderate-intensity exercise). Do strengthening exercises at least twice a week. This is in addition to the moderate-intensity exercise. Spend less time sitting. Even light physical activity can be beneficial. Watch cholesterol and blood lipids Have your blood tested for lipids and cholesterol at 64 years of age, then have this test every 5 years. Have your cholesterol levels checked more often if: Your lipid or cholesterol levels are high. You are older than 64 years of age. You  are at high risk for heart disease. What should I know about cancer screening? Depending on your health history and family history, you may need to have cancer screening at various ages. This may include screening for: Breast cancer. Cervical cancer. Colorectal cancer. Skin cancer. Lung cancer. What should I know about heart disease, diabetes, and high blood pressure? Blood pressure and heart disease High blood pressure causes heart disease and increases the risk of stroke. This is more likely to develop in people who have high blood pressure readings or are overweight. Have your blood pressure checked: Every 3-5 years if you are 30-54 years of age. Every year if you are 14 years old or older. Diabetes Have regular diabetes screenings. This checks your fasting blood sugar level. Have the screening done: Once every three years after age 67 if you are at a normal weight and have a low risk for diabetes. More often and at a younger age if you are overweight or have a high risk for diabetes. What should I know about preventing infection? Hepatitis B If you have a higher risk for hepatitis B, you should be screened for this virus. Talk with your health care provider to find out if you are at risk for hepatitis B infection. Hepatitis C Testing is recommended for: Everyone born from [birth year] through 1966. Anyone with known risk factors for hepatitis C. Sexually transmitted infections (STIs) Get screened for STIs, including gonorrhea and chlamydia, if: You are sexually active and are younger than 63 years of age. You are older than 64 years of age and your health  care provider tells you that you are at risk for this type of infection. Your sexual activity has changed since you were last screened, and you are at increased risk for chlamydia or gonorrhea. Ask your health care provider if you are at risk. Ask your health care provider about whether you are at high risk for HIV. Your health care  provider may recommend a prescription medicine to help prevent HIV infection. If you choose to take medicine to prevent HIV, you should first get tested for HIV. You should then be tested every 3 months for as long as you are taking the medicine. Pregnancy If you are about to stop having your period (premenopausal) and you may become pregnant, seek counseling before you get pregnant. Take 400 to 800 micrograms (mcg) of folic acid  every day if you become pregnant. Ask for birth control (contraception) if you want to prevent pregnancy. Osteoporosis and menopause Osteoporosis is a disease in which the bones lose minerals and strength with aging. This can result in bone fractures. If you are 38 years old or older, or if you are at risk for osteoporosis and fractures, ask your health care provider if you should: Be screened for bone loss. Take a calcium  or vitamin D  supplement to lower your risk of fractures. Be given hormone replacement therapy (HRT) to treat symptoms of menopause. Follow these instructions at home: Alcohol use Do not drink alcohol if: Your health care provider tells you not to drink. You are pregnant, may be pregnant, or are planning to become pregnant. If you drink alcohol: Limit how much you have to: 0-1 drink a day. Know how much alcohol is in your drink. In the [place] one drink equals one 12 oz bottle of beer (355 mL), one 5 oz glass of wine (148 mL), or one 1 oz glass of hard liquor (44 mL). Lifestyle Do not use any products that contain nicotine or tobacco. These products include cigarettes, chewing tobacco, and vaping devices, such as e-cigarettes. If you need help quitting, ask your health care provider. Do not use street drugs. Do not share needles. Ask your health care provider for help if you need support or information about quitting drugs. General instructions Schedule regular health, dental, and eye exams. Stay current with your vaccines. Tell your health care  provider if: You often feel depressed. You have ever been abused or do not feel safe at home. Summary Adopting a healthy lifestyle and getting preventive care are important in promoting health and wellness. Follow your health care provider's instructions about healthy diet, exercising, and getting tested or screened for diseases. Follow your health care provider's instructions on monitoring your cholesterol and blood pressure. This information is not intended to replace advice given to you by your health care provider. Make sure you discuss any questions you have with your health care provider. Document Revised: 09/19/2020 Document Reviewed: 09/19/2020 Elsevier Patient Education  2024 [employer].

## 2024-02-24 ENCOUNTER — Encounter: Payer: Self-pay | Admitting: Family Medicine

## 2024-02-24 ENCOUNTER — Ambulatory Visit: Admitting: Family Medicine

## 2024-02-24 VITALS — BP 113/65 | HR 76 | Temp 98.8°F | Ht 67.0 in | Wt 180.0 lb

## 2024-02-24 DIAGNOSIS — I10 Essential (primary) hypertension: Secondary | ICD-10-CM

## 2024-02-24 DIAGNOSIS — E559 Vitamin D deficiency, unspecified: Secondary | ICD-10-CM

## 2024-02-24 DIAGNOSIS — M0579 Rheumatoid arthritis with rheumatoid factor of multiple sites without organ or systems involvement: Secondary | ICD-10-CM | POA: Diagnosis not present

## 2024-02-24 DIAGNOSIS — E782 Mixed hyperlipidemia: Secondary | ICD-10-CM

## 2024-02-24 DIAGNOSIS — I774 Celiac artery compression syndrome: Secondary | ICD-10-CM

## 2024-02-24 DIAGNOSIS — K2100 Gastro-esophageal reflux disease with esophagitis, without bleeding: Secondary | ICD-10-CM

## 2024-02-24 DIAGNOSIS — E538 Deficiency of other specified B group vitamins: Secondary | ICD-10-CM

## 2024-02-24 DIAGNOSIS — F324 Major depressive disorder, single episode, in partial remission: Secondary | ICD-10-CM

## 2024-02-24 DIAGNOSIS — Z0000 Encounter for general adult medical examination without abnormal findings: Secondary | ICD-10-CM

## 2024-02-24 DIAGNOSIS — F419 Anxiety disorder, unspecified: Secondary | ICD-10-CM

## 2024-02-24 DIAGNOSIS — Z0001 Encounter for general adult medical examination with abnormal findings: Secondary | ICD-10-CM | POA: Diagnosis not present

## 2024-02-24 LAB — LIPID PANEL

## 2024-02-24 NOTE — Progress Notes (Signed)
 Complete physical exam  Patient: Kaitlin Fleming   DOB: Sep 14, 1960   63 y.o. Female  MRN: [id number]  Subjective:    Chief Complaint  Patient presents with   Medical Management of Chronic Issues   Annual Exam    Kaitlin Fleming is a 63 y.o. female who presents today for a complete physical exam. She reports consuming a general diet. The patient does not participate in regular exercise at present. She generally feels fairly well. She reports sleeping fairly well. She does not have additional problems to discuss today.   History of Present Illness   Kaitlin Fleming is a 63 year old female with rheumatoid arthritis who presents for a follow-up visit.  Inflammatory polyarthritis - Rheumatoid arthritis diagnosed 38 years ago - Recent flare-up in June 2025 - Current medication: Rinvoq, with concern for decreased efficacy - Significant pain in toes with some bone erosion - Previous medications: Remicade (discontinued due to allergic reaction), Plaquenil (used for 10 years)  Gastroesophageal reflux symptoms - Occasional heartburn, nausea, and dysphagia - Episodes of choking associated with swallowing difficulty - Colonoscopy and endoscopy delayed due to COVID-19, then due to husband's health; plans to reschedule.   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 0     Most recent depression screenings:    02/24/2024    9:09 AM 11/07/2023   10:52 AM 07/11/2023   11:12 AM  Depression screen PHQ 2/9  Decreased Interest 0 0 0  Down, Depressed, Hopeless 0 0 1  PHQ - 2 Score 0 0 1  Altered sleeping 0 0 0  Tired, decreased energy 1 1 2   Change in appetite 0 0 1  Feeling bad or failure about yourself  0 0 0  Trouble concentrating 0 0 0  Moving slowly or fidgety/restless 0 0 0  Suicidal thoughts 0 0 0  PHQ-9 Score 1 1 4   Difficult doing work/chores Somewhat difficult Somewhat difficult Somewhat difficult      02/24/2024    9:09 AM 11/07/2023   10:52 AM 07/11/2023   11:13 AM 04/02/2023   11:08 AM  GAD 7 : Generalized Anxiety Score  Nervous, Anxious, on Edge 0 0 0 0  Control/stop worrying 0 0 0 1  Worry too much - different things 0 0 0 1  Trouble relaxing 0 1 0 0  Restless 0 0 0 0  Easily annoyed or irritable 0 0 0 1  Afraid - awful might happen 0 0 0 0  Total GAD 7 Score 0 1 0 3  Anxiety Difficulty Not difficult at all  Not difficult at all Somewhat difficult  Patient Care Team: Kaitlin Annabella HERO, FNP as PCP - General (Family Medicine) Kaitlin Carlin POUR, DO as Consulting Physician (Internal Medicine)   Outpatient Medications Prior to Visit  Medication Sig   aspirin  EC 81 MG tablet Take 81 mg by mouth daily. Swallow whole.   busPIRone  (BUSPAR ) 5 MG tablet Take 1 tablet (5 mg total) by mouth 2 (two) times daily.   EPINEPHrine  0.3 mg/0.3 mL IJ SOAJ injection Inject 0.3 mg into the muscle as needed for anaphylaxis.   ezetimibe  (ZETIA ) 10 MG tablet Take 1 tablet (10 mg total) by mouth daily.   ketoconazole  (NIZORAL ) 2 % cream Apply 1 Application topically daily.   lisinopril  (ZESTRIL ) 5 MG tablet Take 1  tablet (5 mg total) by mouth daily.   loratadine  (CLARITIN ) 10 MG tablet Take 1 tablet (10 mg total) by mouth at bedtime.   ondansetron  (ZOFRAN -ODT) 4 MG disintegrating tablet Take 1 tablet (4 mg total) by mouth every 8 (eight) hours as needed.   Oxycodone  HCl 10 MG TABS Take 10 mg by mouth 4 (four) times daily as needed.   pantoprazole  (PROTONIX ) 40 MG tablet Take 1 tablet (40 mg total) by mouth 2 (two) times daily before a meal.   sertraline  (ZOLOFT ) 50 MG tablet Take 1 tablet (50 mg total) by mouth daily.   Upadacitinib ER (RINVOQ) 15 MG TB24 Take 1 tablet by mouth daily.   valACYclovir  (VALTREX ) 1000 MG tablet TAKE ONE (1) TABLET BY MOUTH TWO (2) TIMES DAILY   No facility-administered medications prior to visit.    ROS Negative unless specially indicated above in HPI.    Objective:     BP 113/65   Pulse 76   Temp 98.8 F (37.1 C) (Temporal)   Ht 5' 7 (1.702 m)   Wt 180 lb (81.6 kg)   SpO2 97%   BMI 28.19 kg/m    Physical Exam Vitals and nursing note reviewed.  Constitutional:      General: She is not in acute distress.    Appearance: Normal appearance. She is not ill-appearing.  HENT:     Head: Normocephalic.     Right Ear: Tympanic membrane, ear canal and external ear normal.     Left Ear: Tympanic membrane, ear canal and external ear normal.     Nose: Nose normal.     Mouth/Throat:     Mouth: Mucous membranes are dry.     Pharynx: Oropharynx is clear.  Eyes:     Extraocular Movements: Extraocular movements intact.     Conjunctiva/sclera: Conjunctivae normal.     Pupils: Pupils are equal, round, and reactive to light.  Neck:     Thyroid : No thyroid  mass, thyromegaly or thyroid  tenderness.  Cardiovascular:     Rate and Rhythm: Normal rate and regular rhythm.     Pulses: Normal pulses.     Heart sounds: Normal heart sounds. No murmur heard.    No friction rub. No gallop.  Pulmonary:     Effort: Pulmonary effort is normal.     Breath sounds: Normal breath  sounds.  Abdominal:     General: Bowel sounds are normal. There is no distension.     Palpations: Abdomen is soft. There is no mass.     Tenderness: There is no abdominal tenderness. There is no guarding.  Musculoskeletal:     Cervical back: Normal range of motion and neck supple. No tenderness.     Right lower leg: No edema.     Left lower leg:  No edema.  Skin:    General: Skin is warm and dry.     Capillary Refill: Capillary refill takes less than 2 seconds.     Findings: No lesion or rash.  Neurological:     General: No focal deficit present.     Mental Status: She is alert and oriented to person, place, and time.     Cranial Nerves: No cranial nerve deficit.     Motor: No weakness.     Gait: Gait normal.  Psychiatric:        Mood and Affect: Mood normal.        Behavior: Behavior normal.        Thought Content: Thought content normal.        Judgment: Judgment normal.      No results found for any visits on 02/24/24.     Assessment & Plan:    Routine Health Maintenance and Physical Exam  Kaitlin Fleming was seen today for medical management of chronic issues and annual exam.  Diagnoses and all orders for this visit:  Routine general medical examination at a health care facility  Primary hypertension -     CBC with Differential/Platelet -     CMP14+EGFR  Mixed hyperlipidemia -     Lipid panel  Rheumatoid arthritis involving multiple sites with positive rheumatoid factor (HCC)  Celiac artery stenosis  Anxiety  Depression, major, single episode, in partial remission  Gastroesophageal reflux disease with esophagitis without hemorrhage  Vitamin D  deficiency -     VITAMIN D  25 Hydroxy (Vit-D Deficiency, Fractures)  Vitamin B12 deficiency -     Vitamin B12     Rheumatoid arthritis with rheumatoid factor, multiple sites Managed by rheumatology  Gastroesophageal reflux disease with esophagitis Continue PPI. She will let me know when she is ready for referral to  GI, declines today.   Mixed hyperlipidemia Managed with Zetia  due to statin intolerance. - Recheck cholesterol levels.  Primary hypertension Hypertension well-controlled with lisinopril . Current BP 113/65 mmHg.  Celiac artery compression syndrome Continue follow up with vascular.   Depression and anxiety disorder Managed with Zoloft  and as-needed Buspar . Occasional anxiety attacks.  Vitamin D  deficiency Vitamin D  deficiency. Not on supplement. - Check vitamin D  levels.  Deficiency of B group vitamins Not on B12 supplement. - Check vitamin B12 levels.       Immunization History  Administered Date(s) Administered   PPD Test 07/24/2016    Health Maintenance  Topic Date Due   Colonoscopy  Never done   Mammogram  04/04/2024   Zoster Vaccines- Shingrix (1 of 2) 05/26/2024 (Originally 03/19/1979)   Influenza Vaccine  08/11/2024 (Originally 12/13/2023)   Cervical Cancer Screening (HPV/Pap Cotest)  02/23/2025 (Originally 03/18/1990)   DTaP/Tdap/Td (1 - Tdap) 02/23/2025 (Originally 03/19/1979)   Pneumococcal Vaccine: 50+ Years (1 of 1 - PCV) 02/23/2025 (Originally 03/18/2010)   COVID-19 Vaccine (1) 03/11/2025 (Originally 03/18/1965)   Hepatitis C Screening  Completed   HIV Screening  Completed   Hepatitis B Vaccines 19-59 Average Risk  Aged Out   HPV VACCINES  Aged Out   Meningococcal B Vaccine  Aged Out    Discussed health benefits of physical activity, and encouraged her to engage in regular exercise appropriate for her age and condition.  Problem List Items Addressed This Visit       Cardiovascular and Mediastinum   Celiac artery stenosis   Primary hypertension   Relevant Orders   CBC with Differential/Platelet   CMP14+EGFR  Digestive   GERD (gastroesophageal reflux disease)     Musculoskeletal and Integument   Rheumatoid arthritis (HCC)     Other   Mixed hyperlipidemia   Relevant Orders   Lipid panel   Depression, major, single episode, in partial remission    Anxiety   Vitamin D  deficiency   Relevant Orders   VITAMIN D  25 Hydroxy (Vit-D Deficiency, Fractures)   Vitamin B12 deficiency   Relevant Orders   Vitamin B12   Other Visit Diagnoses       Routine general medical examination at a health care facility    -  Primary      Return in about 3 months (around 05/26/2024) for chronic follow up.   The patient indicates understanding of these issues and agrees with the plan.  Annabella CHRISTELLA Search, FNP

## 2024-02-25 DIAGNOSIS — G894 Chronic pain syndrome: Secondary | ICD-10-CM | POA: Diagnosis not present

## 2024-02-25 DIAGNOSIS — M5416 Radiculopathy, lumbar region: Secondary | ICD-10-CM | POA: Diagnosis not present

## 2024-02-25 DIAGNOSIS — M2550 Pain in unspecified joint: Secondary | ICD-10-CM | POA: Diagnosis not present

## 2024-02-25 DIAGNOSIS — M069 Rheumatoid arthritis, unspecified: Secondary | ICD-10-CM | POA: Diagnosis not present

## 2024-02-25 LAB — CMP14+EGFR
ALT: 11 IU/L (ref 0–32)
AST: 15 IU/L (ref 0–40)
Albumin: 3.9 g/dL (ref 3.9–4.9)
Alkaline Phosphatase: 122 IU/L (ref 49–135)
BUN/Creatinine Ratio: 10 — AB (ref 12–28)
BUN: 7 mg/dL — AB (ref 8–27)
Bilirubin Total: 0.3 mg/dL (ref 0.0–1.2)
CO2: 25 mmol/L (ref 20–29)
Calcium: 8.8 mg/dL (ref 8.7–10.3)
Chloride: 102 mmol/L (ref 96–106)
Creatinine, Ser: 0.71 mg/dL (ref 0.57–1.00)
Globulin, Total: 2.7 g/dL (ref 1.5–4.5)
Glucose: 86 mg/dL (ref 70–99)
Potassium: 4.1 mmol/L (ref 3.5–5.2)
Sodium: 139 mmol/L (ref 134–144)
Total Protein: 6.6 g/dL (ref 6.0–8.5)
eGFR: 95 mL/min/1.73 (ref >59)

## 2024-02-25 LAB — CBC WITH DIFFERENTIAL/PLATELET
Basophils Absolute: 0 x10E3/uL (ref 0.0–0.2)
Basos: 1 %
EOS (ABSOLUTE): 0.2 x10E3/uL (ref 0.0–0.4)
Eos: 4 %
Hematocrit: 36 % (ref 34.0–46.6)
Hemoglobin: 11.7 g/dL (ref 11.1–15.9)
Immature Grans (Abs): 0 x10E3/uL (ref 0.0–0.1)
Immature Granulocytes: 0 %
Lymphocytes Absolute: 1.1 x10E3/uL (ref 0.7–3.1)
Lymphs: 17 %
MCH: 28.9 pg (ref 26.6–33.0)
MCHC: 32.5 g/dL (ref 31.5–35.7)
MCV: 89 fL (ref 79–97)
Monocytes Absolute: 0.5 x10E3/uL (ref 0.1–0.9)
Monocytes: 8 %
Neutrophils Absolute: 4.5 x10E3/uL (ref 1.4–7.0)
Neutrophils: 70 %
Platelets: 254 x10E3/uL (ref 150–450)
RBC: 4.05 x10E6/uL (ref 3.77–5.28)
RDW: 13.1 % (ref 11.7–15.4)
WBC: 6.4 x10E3/uL (ref 3.4–10.8)

## 2024-02-25 LAB — LIPID PANEL
Cholesterol, Total: 204 mg/dL — AB (ref 100–199)
HDL: 52 mg/dL (ref >39)
LDL CALC COMMENT:: 3.9 ratio (ref 0.0–4.4)
LDL Chol Calc (NIH): 128 mg/dL — AB (ref 0–99)
Triglycerides: 134 mg/dL (ref 0–149)
VLDL Cholesterol Cal: 24 mg/dL (ref 5–40)

## 2024-02-25 LAB — VITAMIN B12: Vitamin B-12: 185 pg/mL — AB (ref 232–1245)

## 2024-02-25 LAB — VITAMIN D 25 HYDROXY (VIT D DEFICIENCY, FRACTURES): Vit D, 25-Hydroxy: 9.8 ng/mL — AB (ref 30.0–100.0)

## 2024-02-26 ENCOUNTER — Ambulatory Visit: Payer: Self-pay | Admitting: Family Medicine

## 2024-02-26 ENCOUNTER — Telehealth: Payer: Self-pay

## 2024-02-26 DIAGNOSIS — E538 Deficiency of other specified B group vitamins: Secondary | ICD-10-CM

## 2024-02-26 DIAGNOSIS — E559 Vitamin D deficiency, unspecified: Secondary | ICD-10-CM

## 2024-02-26 MED ORDER — VITAMIN D (ERGOCALCIFEROL) 1.25 MG (50000 UNIT) PO CAPS: 50000.0000 [IU] | ORAL_CAPSULE | ORAL | 0 refills | Status: AC

## 2024-02-26 MED ORDER — VITAMIN B-12 1000 MCG PO TABS: 1000.0000 ug | ORAL_TABLET | Freq: Every day | ORAL | 3 refills | Status: AC

## 2024-02-26 NOTE — Telephone Encounter (Signed)
 Copied from CRM [id number]. Topic: Clinical - Medical Advice >> Feb 26, 2024  3:38 PM Joesph NOVAK wrote: Reason for CRM: Patient was prescribed Vitamin D  (gel) capsules. She cannot take gel capsules. She would like to know if theres something else she can take? Like liquid?

## 2024-02-27 ENCOUNTER — Telehealth: Payer: Self-pay

## 2024-02-27 NOTE — Telephone Encounter (Signed)
 Per Tiffany pt to get otc Vit D 5,000 units daily and pt aware and voiced understanding.

## 2024-02-27 NOTE — Telephone Encounter (Signed)
 Copied from CRM [id number]. Topic: Clinical - Medication Question >> Feb 27, 2024  2:11 PM Gustabo BIRCH wrote: Pt wants a call back from [place] [phone]

## 2024-02-27 NOTE — Telephone Encounter (Unsigned)
 Copied from CRM [id number]. Topic: General - Other >> Feb 27, 2024 12:02 PM Santiya F wrote: Reason for CRM: Patient is calling in requesting to speak with [facility] regarding Vitamin D  capsules. Patient says she cannot take capsules and wanted to know if there was an alternative. Please follow up with patient.

## 2024-02-27 NOTE — Telephone Encounter (Signed)
 This has already been addressed in another telephone encounter, will close this one.

## 2024-03-02 DIAGNOSIS — M0579 Rheumatoid arthritis with rheumatoid factor of multiple sites without organ or systems involvement: Secondary | ICD-10-CM | POA: Diagnosis not present

## 2024-03-02 DIAGNOSIS — Z79899 Other long term (current) drug therapy: Secondary | ICD-10-CM | POA: Diagnosis not present

## 2024-03-02 DIAGNOSIS — Z91199 Patient's noncompliance with other medical treatment and regimen due to unspecified reason: Secondary | ICD-10-CM | POA: Diagnosis not present

## 2024-03-26 DIAGNOSIS — M47896 Other spondylosis, lumbar region: Secondary | ICD-10-CM | POA: Diagnosis not present

## 2024-03-26 DIAGNOSIS — M47816 Spondylosis without myelopathy or radiculopathy, lumbar region: Secondary | ICD-10-CM | POA: Diagnosis not present

## 2024-03-26 DIAGNOSIS — M47819 Spondylosis without myelopathy or radiculopathy, site unspecified: Secondary | ICD-10-CM | POA: Diagnosis not present

## 2024-03-31 ENCOUNTER — Encounter: Payer: Self-pay | Admitting: Family Medicine

## 2024-04-15 ENCOUNTER — Other Ambulatory Visit: Payer: Self-pay

## 2024-04-15 DIAGNOSIS — E538 Deficiency of other specified B group vitamins: Secondary | ICD-10-CM | POA: Diagnosis not present

## 2024-04-16 ENCOUNTER — Ambulatory Visit: Payer: Self-pay | Admitting: Family Medicine

## 2024-04-16 LAB — VITAMIN B12: Vitamin B-12: 411 pg/mL (ref 232–1245)

## 2024-04-27 ENCOUNTER — Other Ambulatory Visit: Payer: Self-pay | Admitting: Family Medicine

## 2024-04-27 DIAGNOSIS — K2100 Gastro-esophageal reflux disease with esophagitis, without bleeding: Secondary | ICD-10-CM

## 2024-04-28 ENCOUNTER — Other Ambulatory Visit: Payer: Self-pay | Admitting: *Deleted

## 2024-04-28 DIAGNOSIS — B001 Herpesviral vesicular dermatitis: Secondary | ICD-10-CM

## 2024-04-28 MED ORDER — VALACYCLOVIR HCL 1 G PO TABS: 1000.0000 mg | ORAL_TABLET | Freq: Two times a day (BID) | ORAL | 3 refills | Status: AC

## 2024-05-07 DIAGNOSIS — Z20828 Contact with and (suspected) exposure to other viral communicable diseases: Secondary | ICD-10-CM | POA: Diagnosis not present

## 2024-05-07 DIAGNOSIS — M51369 Other intervertebral disc degeneration, lumbar region without mention of lumbar back pain or lower extremity pain: Secondary | ICD-10-CM | POA: Diagnosis not present

## 2024-05-07 DIAGNOSIS — R6883 Chills (without fever): Secondary | ICD-10-CM | POA: Diagnosis not present

## 2024-05-07 DIAGNOSIS — R197 Diarrhea, unspecified: Secondary | ICD-10-CM | POA: Diagnosis not present

## 2024-05-07 DIAGNOSIS — I10 Essential (primary) hypertension: Secondary | ICD-10-CM | POA: Diagnosis not present

## 2024-05-07 DIAGNOSIS — R059 Cough, unspecified: Secondary | ICD-10-CM | POA: Diagnosis not present

## 2024-05-27 ENCOUNTER — Ambulatory Visit: Payer: Self-pay | Admitting: Family Medicine

## 2024-05-28 ENCOUNTER — Other Ambulatory Visit: Payer: Self-pay | Admitting: Family Medicine

## 2024-05-28 DIAGNOSIS — E782 Mixed hyperlipidemia: Secondary | ICD-10-CM

## 2024-05-28 DIAGNOSIS — I10 Essential (primary) hypertension: Secondary | ICD-10-CM

## 2024-05-29 MED ORDER — EZETIMIBE 10 MG PO TABS: 10.0000 mg | ORAL_TABLET | Freq: Every day | ORAL | 0 refills | Status: AC
